# Patient Record
Sex: Female | Born: 1935
Health system: Southern US, Community
[De-identification: ages and names within clinical notes are randomized; demographics above are authoritative.]

## PROBLEM LIST (undated history)

## (undated) DIAGNOSIS — J189 Pneumonia, unspecified organism: Secondary | ICD-10-CM

## (undated) DIAGNOSIS — M4800 Spinal stenosis, site unspecified: Secondary | ICD-10-CM

## (undated) DIAGNOSIS — Z950 Presence of cardiac pacemaker: Secondary | ICD-10-CM

## (undated) DIAGNOSIS — F0390 Unspecified dementia without behavioral disturbance: Secondary | ICD-10-CM

## (undated) DIAGNOSIS — I462 Cardiac arrest due to underlying cardiac condition: Secondary | ICD-10-CM

## (undated) DIAGNOSIS — R001 Bradycardia, unspecified: Secondary | ICD-10-CM

## (undated) DIAGNOSIS — D860 Sarcoidosis of lung: Secondary | ICD-10-CM

## (undated) DIAGNOSIS — J449 Chronic obstructive pulmonary disease, unspecified: Secondary | ICD-10-CM

## (undated) DIAGNOSIS — I429 Cardiomyopathy, unspecified: Secondary | ICD-10-CM

## (undated) DIAGNOSIS — M810 Age-related osteoporosis without current pathological fracture: Secondary | ICD-10-CM

## (undated) DIAGNOSIS — E7800 Pure hypercholesterolemia, unspecified: Secondary | ICD-10-CM

## (undated) DIAGNOSIS — R55 Syncope and collapse: Secondary | ICD-10-CM

## (undated) DIAGNOSIS — Z8719 Personal history of other diseases of the digestive system: Secondary | ICD-10-CM

## (undated) HISTORY — DX: Cardiac arrest due to underlying cardiac condition: I46.2

## (undated) HISTORY — DX: Age-related osteoporosis without current pathological fracture: M81.0

## (undated) HISTORY — DX: Personal history of other diseases of the digestive system: Z87.19

## (undated) HISTORY — DX: Chronic obstructive pulmonary disease, unspecified: J44.9

## (undated) HISTORY — PX: TONSILLECTOMY: SUR1361

## (undated) HISTORY — DX: Bradycardia, unspecified: R00.1

## (undated) HISTORY — DX: Sarcoidosis of lung: D86.0

## (undated) HISTORY — DX: Cardiomyopathy, unspecified: I42.9

## (undated) HISTORY — DX: Syncope and collapse: R55

## (undated) HISTORY — DX: Spinal stenosis, site unspecified: M48.00

## (undated) HISTORY — PX: ADENOIDECTOMY: SUR15

---

## 2013-02-17 HISTORY — PX: TEE WITHOUT CARDIOVERSION: SHX5443

## 2015-11-21 DIAGNOSIS — E7800 Pure hypercholesterolemia, unspecified: Secondary | ICD-10-CM | POA: Diagnosis not present

## 2015-11-21 DIAGNOSIS — S161XXA Strain of muscle, fascia and tendon at neck level, initial encounter: Secondary | ICD-10-CM | POA: Diagnosis not present

## 2015-11-21 DIAGNOSIS — I779 Disorder of arteries and arterioles, unspecified: Secondary | ICD-10-CM | POA: Diagnosis not present

## 2015-12-08 DIAGNOSIS — J440 Chronic obstructive pulmonary disease with acute lower respiratory infection: Secondary | ICD-10-CM | POA: Diagnosis not present

## 2015-12-12 DIAGNOSIS — K439 Ventral hernia without obstruction or gangrene: Secondary | ICD-10-CM | POA: Diagnosis not present

## 2015-12-12 DIAGNOSIS — J209 Acute bronchitis, unspecified: Secondary | ICD-10-CM | POA: Diagnosis not present

## 2015-12-15 DIAGNOSIS — R1011 Right upper quadrant pain: Secondary | ICD-10-CM | POA: Diagnosis not present

## 2015-12-15 DIAGNOSIS — K5901 Slow transit constipation: Secondary | ICD-10-CM | POA: Diagnosis not present

## 2015-12-21 DIAGNOSIS — R140 Abdominal distension (gaseous): Secondary | ICD-10-CM | POA: Diagnosis not present

## 2015-12-21 DIAGNOSIS — K5909 Other constipation: Secondary | ICD-10-CM | POA: Diagnosis not present

## 2015-12-21 DIAGNOSIS — M6208 Separation of muscle (nontraumatic), other site: Secondary | ICD-10-CM | POA: Diagnosis not present

## 2015-12-26 DIAGNOSIS — I70203 Unspecified atherosclerosis of native arteries of extremities, bilateral legs: Secondary | ICD-10-CM | POA: Diagnosis not present

## 2015-12-26 DIAGNOSIS — H04123 Dry eye syndrome of bilateral lacrimal glands: Secondary | ICD-10-CM | POA: Diagnosis not present

## 2015-12-26 DIAGNOSIS — B351 Tinea unguium: Secondary | ICD-10-CM | POA: Diagnosis not present

## 2015-12-26 DIAGNOSIS — L97511 Non-pressure chronic ulcer of other part of right foot limited to breakdown of skin: Secondary | ICD-10-CM | POA: Diagnosis not present

## 2016-01-06 DIAGNOSIS — H04123 Dry eye syndrome of bilateral lacrimal glands: Secondary | ICD-10-CM | POA: Diagnosis not present

## 2016-01-23 DIAGNOSIS — R0982 Postnasal drip: Secondary | ICD-10-CM | POA: Diagnosis not present

## 2016-01-23 DIAGNOSIS — M79604 Pain in right leg: Secondary | ICD-10-CM | POA: Diagnosis not present

## 2016-02-18 DIAGNOSIS — D869 Sarcoidosis, unspecified: Secondary | ICD-10-CM | POA: Diagnosis not present

## 2016-02-18 DIAGNOSIS — R05 Cough: Secondary | ICD-10-CM | POA: Diagnosis not present

## 2016-02-18 DIAGNOSIS — R509 Fever, unspecified: Secondary | ICD-10-CM | POA: Diagnosis not present

## 2016-02-23 DIAGNOSIS — D869 Sarcoidosis, unspecified: Secondary | ICD-10-CM | POA: Diagnosis not present

## 2016-02-23 DIAGNOSIS — H6120 Impacted cerumen, unspecified ear: Secondary | ICD-10-CM | POA: Diagnosis not present

## 2016-02-23 DIAGNOSIS — J301 Allergic rhinitis due to pollen: Secondary | ICD-10-CM | POA: Diagnosis not present

## 2016-02-23 DIAGNOSIS — J441 Chronic obstructive pulmonary disease with (acute) exacerbation: Secondary | ICD-10-CM | POA: Diagnosis not present

## 2016-02-25 DIAGNOSIS — S0090XA Unspecified superficial injury of unspecified part of head, initial encounter: Secondary | ICD-10-CM | POA: Diagnosis not present

## 2016-02-25 DIAGNOSIS — S0990XA Unspecified injury of head, initial encounter: Secondary | ICD-10-CM | POA: Diagnosis not present

## 2016-02-25 DIAGNOSIS — J449 Chronic obstructive pulmonary disease, unspecified: Secondary | ICD-10-CM | POA: Diagnosis not present

## 2016-02-25 DIAGNOSIS — R51 Headache: Secondary | ICD-10-CM | POA: Diagnosis not present

## 2016-02-29 DIAGNOSIS — G319 Degenerative disease of nervous system, unspecified: Secondary | ICD-10-CM | POA: Diagnosis not present

## 2016-02-29 DIAGNOSIS — S0003XA Contusion of scalp, initial encounter: Secondary | ICD-10-CM | POA: Diagnosis not present

## 2016-03-01 DIAGNOSIS — E785 Hyperlipidemia, unspecified: Secondary | ICD-10-CM | POA: Diagnosis not present

## 2016-03-01 DIAGNOSIS — J449 Chronic obstructive pulmonary disease, unspecified: Secondary | ICD-10-CM | POA: Diagnosis not present

## 2016-03-01 DIAGNOSIS — R51 Headache: Secondary | ICD-10-CM | POA: Diagnosis not present

## 2016-03-01 DIAGNOSIS — S0990XA Unspecified injury of head, initial encounter: Secondary | ICD-10-CM | POA: Diagnosis not present

## 2016-03-16 DIAGNOSIS — R1310 Dysphagia, unspecified: Secondary | ICD-10-CM | POA: Diagnosis not present

## 2016-03-16 DIAGNOSIS — R682 Dry mouth, unspecified: Secondary | ICD-10-CM | POA: Diagnosis not present

## 2016-03-16 DIAGNOSIS — J309 Allergic rhinitis, unspecified: Secondary | ICD-10-CM | POA: Diagnosis not present

## 2016-03-16 DIAGNOSIS — J449 Chronic obstructive pulmonary disease, unspecified: Secondary | ICD-10-CM | POA: Diagnosis not present

## 2016-04-07 DIAGNOSIS — J449 Chronic obstructive pulmonary disease, unspecified: Secondary | ICD-10-CM | POA: Diagnosis not present

## 2016-04-07 DIAGNOSIS — J209 Acute bronchitis, unspecified: Secondary | ICD-10-CM | POA: Diagnosis not present

## 2016-04-07 DIAGNOSIS — J029 Acute pharyngitis, unspecified: Secondary | ICD-10-CM | POA: Diagnosis not present

## 2016-04-11 DIAGNOSIS — I340 Nonrheumatic mitral (valve) insufficiency: Secondary | ICD-10-CM | POA: Diagnosis present

## 2016-04-11 DIAGNOSIS — Z8709 Personal history of other diseases of the respiratory system: Secondary | ICD-10-CM | POA: Diagnosis not present

## 2016-04-11 DIAGNOSIS — R140 Abdominal distension (gaseous): Secondary | ICD-10-CM | POA: Diagnosis not present

## 2016-04-11 DIAGNOSIS — E785 Hyperlipidemia, unspecified: Secondary | ICD-10-CM | POA: Diagnosis not present

## 2016-04-11 DIAGNOSIS — I4510 Unspecified right bundle-branch block: Secondary | ICD-10-CM | POA: Diagnosis not present

## 2016-04-11 DIAGNOSIS — R000 Tachycardia, unspecified: Secondary | ICD-10-CM | POA: Diagnosis not present

## 2016-04-11 DIAGNOSIS — R0602 Shortness of breath: Secondary | ICD-10-CM | POA: Diagnosis not present

## 2016-04-11 DIAGNOSIS — I4891 Unspecified atrial fibrillation: Secondary | ICD-10-CM | POA: Diagnosis not present

## 2016-04-11 DIAGNOSIS — Z87891 Personal history of nicotine dependence: Secondary | ICD-10-CM | POA: Diagnosis not present

## 2016-04-11 DIAGNOSIS — K219 Gastro-esophageal reflux disease without esophagitis: Secondary | ICD-10-CM | POA: Diagnosis present

## 2016-04-11 DIAGNOSIS — R51 Headache: Secondary | ICD-10-CM | POA: Diagnosis present

## 2016-04-11 DIAGNOSIS — D869 Sarcoidosis, unspecified: Secondary | ICD-10-CM | POA: Diagnosis present

## 2016-04-11 DIAGNOSIS — F419 Anxiety disorder, unspecified: Secondary | ICD-10-CM | POA: Diagnosis not present

## 2016-04-11 DIAGNOSIS — J209 Acute bronchitis, unspecified: Secondary | ICD-10-CM | POA: Diagnosis present

## 2016-04-11 DIAGNOSIS — D860 Sarcoidosis of lung: Secondary | ICD-10-CM | POA: Diagnosis not present

## 2016-04-11 DIAGNOSIS — R0600 Dyspnea, unspecified: Secondary | ICD-10-CM | POA: Diagnosis not present

## 2016-04-11 DIAGNOSIS — E871 Hypo-osmolality and hyponatremia: Secondary | ICD-10-CM | POA: Diagnosis not present

## 2016-04-11 DIAGNOSIS — R9431 Abnormal electrocardiogram [ECG] [EKG]: Secondary | ICD-10-CM | POA: Diagnosis not present

## 2016-04-11 DIAGNOSIS — J440 Chronic obstructive pulmonary disease with acute lower respiratory infection: Secondary | ICD-10-CM | POA: Diagnosis present

## 2016-04-11 DIAGNOSIS — J189 Pneumonia, unspecified organism: Secondary | ICD-10-CM | POA: Diagnosis not present

## 2016-04-11 DIAGNOSIS — R141 Gas pain: Secondary | ICD-10-CM | POA: Diagnosis not present

## 2016-04-11 DIAGNOSIS — K760 Fatty (change of) liver, not elsewhere classified: Secondary | ICD-10-CM | POA: Diagnosis present

## 2016-04-15 DIAGNOSIS — B370 Candidal stomatitis: Secondary | ICD-10-CM | POA: Diagnosis not present

## 2016-04-15 DIAGNOSIS — K1379 Other lesions of oral mucosa: Secondary | ICD-10-CM | POA: Diagnosis not present

## 2016-04-15 DIAGNOSIS — J449 Chronic obstructive pulmonary disease, unspecified: Secondary | ICD-10-CM | POA: Diagnosis not present

## 2016-04-15 DIAGNOSIS — Z87891 Personal history of nicotine dependence: Secondary | ICD-10-CM | POA: Diagnosis not present

## 2016-04-18 DIAGNOSIS — J438 Other emphysema: Secondary | ICD-10-CM | POA: Diagnosis not present

## 2016-04-23 DIAGNOSIS — H04123 Dry eye syndrome of bilateral lacrimal glands: Secondary | ICD-10-CM | POA: Diagnosis not present

## 2016-04-23 DIAGNOSIS — H353133 Nonexudative age-related macular degeneration, bilateral, advanced atrophic without subfoveal involvement: Secondary | ICD-10-CM | POA: Diagnosis not present

## 2016-05-02 DIAGNOSIS — E872 Acidosis: Secondary | ICD-10-CM | POA: Diagnosis not present

## 2016-05-02 DIAGNOSIS — D869 Sarcoidosis, unspecified: Secondary | ICD-10-CM | POA: Diagnosis not present

## 2016-05-02 DIAGNOSIS — R739 Hyperglycemia, unspecified: Secondary | ICD-10-CM | POA: Diagnosis not present

## 2016-05-02 DIAGNOSIS — J438 Other emphysema: Secondary | ICD-10-CM | POA: Diagnosis not present

## 2016-05-02 DIAGNOSIS — D649 Anemia, unspecified: Secondary | ICD-10-CM | POA: Diagnosis not present

## 2016-05-02 DIAGNOSIS — K5901 Slow transit constipation: Secondary | ICD-10-CM | POA: Diagnosis not present

## 2016-05-02 DIAGNOSIS — E871 Hypo-osmolality and hyponatremia: Secondary | ICD-10-CM | POA: Diagnosis not present

## 2016-05-14 DIAGNOSIS — R918 Other nonspecific abnormal finding of lung field: Secondary | ICD-10-CM | POA: Diagnosis not present

## 2016-05-14 DIAGNOSIS — R0602 Shortness of breath: Secondary | ICD-10-CM | POA: Diagnosis not present

## 2016-05-17 DIAGNOSIS — M5431 Sciatica, right side: Secondary | ICD-10-CM | POA: Diagnosis not present

## 2016-05-18 DIAGNOSIS — R030 Elevated blood-pressure reading, without diagnosis of hypertension: Secondary | ICD-10-CM | POA: Diagnosis not present

## 2016-05-18 DIAGNOSIS — M545 Low back pain: Secondary | ICD-10-CM | POA: Diagnosis not present

## 2016-05-18 DIAGNOSIS — J449 Chronic obstructive pulmonary disease, unspecified: Secondary | ICD-10-CM | POA: Diagnosis not present

## 2016-05-23 DIAGNOSIS — I712 Thoracic aortic aneurysm, without rupture: Secondary | ICD-10-CM | POA: Diagnosis not present

## 2016-05-23 DIAGNOSIS — M4854XD Collapsed vertebra, not elsewhere classified, thoracic region, subsequent encounter for fracture with routine healing: Secondary | ICD-10-CM | POA: Diagnosis not present

## 2016-05-23 DIAGNOSIS — R918 Other nonspecific abnormal finding of lung field: Secondary | ICD-10-CM | POA: Diagnosis not present

## 2016-05-28 DIAGNOSIS — L84 Corns and callosities: Secondary | ICD-10-CM | POA: Diagnosis not present

## 2016-05-28 DIAGNOSIS — I70203 Unspecified atherosclerosis of native arteries of extremities, bilateral legs: Secondary | ICD-10-CM | POA: Diagnosis not present

## 2016-05-28 DIAGNOSIS — B351 Tinea unguium: Secondary | ICD-10-CM | POA: Diagnosis not present

## 2016-05-30 DIAGNOSIS — M545 Low back pain: Secondary | ICD-10-CM | POA: Diagnosis not present

## 2016-05-30 DIAGNOSIS — S22000S Wedge compression fracture of unspecified thoracic vertebra, sequela: Secondary | ICD-10-CM | POA: Diagnosis not present

## 2016-06-05 DIAGNOSIS — M545 Low back pain: Secondary | ICD-10-CM | POA: Diagnosis not present

## 2016-06-07 DIAGNOSIS — M545 Low back pain: Secondary | ICD-10-CM | POA: Diagnosis not present

## 2016-06-11 DIAGNOSIS — H1032 Unspecified acute conjunctivitis, left eye: Secondary | ICD-10-CM | POA: Diagnosis not present

## 2016-06-12 DIAGNOSIS — M545 Low back pain: Secondary | ICD-10-CM | POA: Diagnosis not present

## 2016-06-13 DIAGNOSIS — L089 Local infection of the skin and subcutaneous tissue, unspecified: Secondary | ICD-10-CM | POA: Diagnosis not present

## 2016-06-14 DIAGNOSIS — M545 Low back pain: Secondary | ICD-10-CM | POA: Diagnosis not present

## 2016-06-14 DIAGNOSIS — S99922A Unspecified injury of left foot, initial encounter: Secondary | ICD-10-CM | POA: Diagnosis not present

## 2016-06-15 DIAGNOSIS — R0602 Shortness of breath: Secondary | ICD-10-CM | POA: Diagnosis not present

## 2016-06-15 DIAGNOSIS — I444 Left anterior fascicular block: Secondary | ICD-10-CM | POA: Diagnosis not present

## 2016-06-15 DIAGNOSIS — J302 Other seasonal allergic rhinitis: Secondary | ICD-10-CM | POA: Diagnosis not present

## 2016-06-19 DIAGNOSIS — S90512D Abrasion, left ankle, subsequent encounter: Secondary | ICD-10-CM | POA: Diagnosis not present

## 2016-06-19 DIAGNOSIS — M545 Low back pain: Secondary | ICD-10-CM | POA: Diagnosis not present

## 2016-06-21 DIAGNOSIS — M545 Low back pain: Secondary | ICD-10-CM | POA: Diagnosis not present

## 2016-06-22 DIAGNOSIS — M549 Dorsalgia, unspecified: Secondary | ICD-10-CM | POA: Diagnosis not present

## 2016-06-26 DIAGNOSIS — M545 Low back pain: Secondary | ICD-10-CM | POA: Diagnosis not present

## 2016-06-28 DIAGNOSIS — S90819A Abrasion, unspecified foot, initial encounter: Secondary | ICD-10-CM | POA: Diagnosis not present

## 2016-07-03 DIAGNOSIS — M545 Low back pain: Secondary | ICD-10-CM | POA: Diagnosis not present

## 2016-07-05 DIAGNOSIS — M545 Low back pain: Secondary | ICD-10-CM | POA: Diagnosis not present

## 2016-07-10 DIAGNOSIS — M545 Low back pain: Secondary | ICD-10-CM | POA: Diagnosis not present

## 2016-07-17 DIAGNOSIS — M545 Low back pain: Secondary | ICD-10-CM | POA: Diagnosis not present

## 2016-07-19 DIAGNOSIS — M545 Low back pain: Secondary | ICD-10-CM | POA: Diagnosis not present

## 2016-07-20 DIAGNOSIS — I429 Cardiomyopathy, unspecified: Secondary | ICD-10-CM

## 2016-07-20 HISTORY — DX: Cardiomyopathy, unspecified: I42.9

## 2016-07-23 DIAGNOSIS — R0682 Tachypnea, not elsewhere classified: Secondary | ICD-10-CM | POA: Diagnosis not present

## 2016-07-23 DIAGNOSIS — I499 Cardiac arrhythmia, unspecified: Secondary | ICD-10-CM | POA: Diagnosis not present

## 2016-07-23 DIAGNOSIS — R296 Repeated falls: Secondary | ICD-10-CM | POA: Diagnosis not present

## 2016-07-23 DIAGNOSIS — I442 Atrioventricular block, complete: Secondary | ICD-10-CM | POA: Diagnosis not present

## 2016-07-23 DIAGNOSIS — R001 Bradycardia, unspecified: Secondary | ICD-10-CM | POA: Diagnosis not present

## 2016-07-23 DIAGNOSIS — R42 Dizziness and giddiness: Secondary | ICD-10-CM | POA: Diagnosis not present

## 2016-07-23 DIAGNOSIS — I469 Cardiac arrest, cause unspecified: Secondary | ICD-10-CM | POA: Diagnosis not present

## 2016-07-23 DIAGNOSIS — R110 Nausea: Secondary | ICD-10-CM | POA: Diagnosis not present

## 2016-07-23 DIAGNOSIS — R55 Syncope and collapse: Secondary | ICD-10-CM | POA: Diagnosis not present

## 2016-07-24 DIAGNOSIS — I214 Non-ST elevation (NSTEMI) myocardial infarction: Secondary | ICD-10-CM | POA: Diagnosis not present

## 2016-07-24 DIAGNOSIS — J309 Allergic rhinitis, unspecified: Secondary | ICD-10-CM | POA: Diagnosis present

## 2016-07-24 DIAGNOSIS — I5181 Takotsubo syndrome: Secondary | ICD-10-CM | POA: Diagnosis not present

## 2016-07-24 DIAGNOSIS — R931 Abnormal findings on diagnostic imaging of heart and coronary circulation: Secondary | ICD-10-CM | POA: Diagnosis not present

## 2016-07-24 DIAGNOSIS — M4800 Spinal stenosis, site unspecified: Secondary | ICD-10-CM | POA: Diagnosis present

## 2016-07-24 DIAGNOSIS — I10 Essential (primary) hypertension: Secondary | ICD-10-CM | POA: Diagnosis present

## 2016-07-24 DIAGNOSIS — J95811 Postprocedural pneumothorax: Secondary | ICD-10-CM | POA: Diagnosis not present

## 2016-07-24 DIAGNOSIS — K567 Ileus, unspecified: Secondary | ICD-10-CM | POA: Diagnosis not present

## 2016-07-24 DIAGNOSIS — J441 Chronic obstructive pulmonary disease with (acute) exacerbation: Secondary | ICD-10-CM | POA: Diagnosis not present

## 2016-07-24 DIAGNOSIS — R740 Nonspecific elevation of levels of transaminase and lactic acid dehydrogenase [LDH]: Secondary | ICD-10-CM | POA: Diagnosis not present

## 2016-07-24 DIAGNOSIS — I469 Cardiac arrest, cause unspecified: Secondary | ICD-10-CM | POA: Diagnosis not present

## 2016-07-24 DIAGNOSIS — J939 Pneumothorax, unspecified: Secondary | ICD-10-CM | POA: Diagnosis not present

## 2016-07-24 DIAGNOSIS — R001 Bradycardia, unspecified: Secondary | ICD-10-CM | POA: Diagnosis not present

## 2016-07-24 DIAGNOSIS — I420 Dilated cardiomyopathy: Secondary | ICD-10-CM | POA: Insufficient documentation

## 2016-07-24 DIAGNOSIS — I509 Heart failure, unspecified: Secondary | ICD-10-CM | POA: Diagnosis not present

## 2016-07-24 DIAGNOSIS — E7800 Pure hypercholesterolemia, unspecified: Secondary | ICD-10-CM | POA: Diagnosis present

## 2016-07-24 DIAGNOSIS — S36118A Other injury of liver, initial encounter: Secondary | ICD-10-CM | POA: Diagnosis not present

## 2016-07-24 DIAGNOSIS — I452 Bifascicular block: Secondary | ICD-10-CM | POA: Diagnosis present

## 2016-07-24 DIAGNOSIS — R092 Respiratory arrest: Secondary | ICD-10-CM | POA: Diagnosis not present

## 2016-07-24 DIAGNOSIS — Z950 Presence of cardiac pacemaker: Secondary | ICD-10-CM | POA: Diagnosis not present

## 2016-07-24 DIAGNOSIS — D869 Sarcoidosis, unspecified: Secondary | ICD-10-CM | POA: Diagnosis not present

## 2016-07-24 DIAGNOSIS — R933 Abnormal findings on diagnostic imaging of other parts of digestive tract: Secondary | ICD-10-CM | POA: Diagnosis not present

## 2016-07-24 DIAGNOSIS — K5909 Other constipation: Secondary | ICD-10-CM | POA: Diagnosis present

## 2016-07-24 DIAGNOSIS — J449 Chronic obstructive pulmonary disease, unspecified: Secondary | ICD-10-CM | POA: Diagnosis present

## 2016-07-24 DIAGNOSIS — I442 Atrioventricular block, complete: Secondary | ICD-10-CM | POA: Diagnosis not present

## 2016-07-24 DIAGNOSIS — H532 Diplopia: Secondary | ICD-10-CM | POA: Diagnosis not present

## 2016-07-24 DIAGNOSIS — J9601 Acute respiratory failure with hypoxia: Secondary | ICD-10-CM | POA: Diagnosis present

## 2016-07-24 DIAGNOSIS — B370 Candidal stomatitis: Secondary | ICD-10-CM | POA: Diagnosis not present

## 2016-07-24 DIAGNOSIS — M810 Age-related osteoporosis without current pathological fracture: Secondary | ICD-10-CM | POA: Diagnosis present

## 2016-07-24 DIAGNOSIS — I462 Cardiac arrest due to underlying cardiac condition: Secondary | ICD-10-CM

## 2016-07-24 DIAGNOSIS — R680 Hypothermia, not associated with low environmental temperature: Secondary | ICD-10-CM | POA: Diagnosis present

## 2016-07-24 DIAGNOSIS — K769 Liver disease, unspecified: Secondary | ICD-10-CM | POA: Diagnosis present

## 2016-07-24 DIAGNOSIS — I2119 ST elevation (STEMI) myocardial infarction involving other coronary artery of inferior wall: Secondary | ICD-10-CM | POA: Diagnosis not present

## 2016-07-24 DIAGNOSIS — M6281 Muscle weakness (generalized): Secondary | ICD-10-CM | POA: Diagnosis not present

## 2016-07-24 DIAGNOSIS — R1319 Other dysphagia: Secondary | ICD-10-CM | POA: Diagnosis present

## 2016-07-24 DIAGNOSIS — R0781 Pleurodynia: Secondary | ICD-10-CM | POA: Diagnosis not present

## 2016-07-24 DIAGNOSIS — K219 Gastro-esophageal reflux disease without esophagitis: Secondary | ICD-10-CM | POA: Diagnosis not present

## 2016-07-24 DIAGNOSIS — I499 Cardiac arrhythmia, unspecified: Secondary | ICD-10-CM | POA: Diagnosis not present

## 2016-07-24 DIAGNOSIS — K5900 Constipation, unspecified: Secondary | ICD-10-CM | POA: Diagnosis not present

## 2016-07-24 DIAGNOSIS — J90 Pleural effusion, not elsewhere classified: Secondary | ICD-10-CM | POA: Diagnosis not present

## 2016-07-24 DIAGNOSIS — K760 Fatty (change of) liver, not elsewhere classified: Secondary | ICD-10-CM | POA: Diagnosis present

## 2016-07-24 DIAGNOSIS — K7200 Acute and subacute hepatic failure without coma: Secondary | ICD-10-CM | POA: Diagnosis present

## 2016-07-24 DIAGNOSIS — R918 Other nonspecific abnormal finding of lung field: Secondary | ICD-10-CM | POA: Diagnosis not present

## 2016-07-24 DIAGNOSIS — R55 Syncope and collapse: Secondary | ICD-10-CM

## 2016-07-24 DIAGNOSIS — S2249XA Multiple fractures of ribs, unspecified side, initial encounter for closed fracture: Secondary | ICD-10-CM | POA: Diagnosis present

## 2016-07-24 DIAGNOSIS — I2510 Atherosclerotic heart disease of native coronary artery without angina pectoris: Secondary | ICD-10-CM | POA: Diagnosis not present

## 2016-07-24 DIAGNOSIS — I4510 Unspecified right bundle-branch block: Secondary | ICD-10-CM | POA: Diagnosis not present

## 2016-07-24 DIAGNOSIS — E785 Hyperlipidemia, unspecified: Secondary | ICD-10-CM | POA: Diagnosis not present

## 2016-07-24 DIAGNOSIS — S2242XA Multiple fractures of ribs, left side, initial encounter for closed fracture: Secondary | ICD-10-CM | POA: Diagnosis not present

## 2016-07-24 DIAGNOSIS — Z938 Other artificial opening status: Secondary | ICD-10-CM | POA: Diagnosis not present

## 2016-07-24 DIAGNOSIS — I083 Combined rheumatic disorders of mitral, aortic and tricuspid valves: Secondary | ICD-10-CM | POA: Diagnosis not present

## 2016-07-24 DIAGNOSIS — R262 Difficulty in walking, not elsewhere classified: Secondary | ICD-10-CM | POA: Diagnosis not present

## 2016-07-24 DIAGNOSIS — M62838 Other muscle spasm: Secondary | ICD-10-CM | POA: Diagnosis not present

## 2016-07-24 DIAGNOSIS — R109 Unspecified abdominal pain: Secondary | ICD-10-CM | POA: Diagnosis not present

## 2016-07-24 DIAGNOSIS — J9600 Acute respiratory failure, unspecified whether with hypoxia or hypercapnia: Secondary | ICD-10-CM | POA: Diagnosis not present

## 2016-07-24 DIAGNOSIS — I444 Left anterior fascicular block: Secondary | ICD-10-CM | POA: Diagnosis not present

## 2016-07-24 DIAGNOSIS — R570 Cardiogenic shock: Secondary | ICD-10-CM | POA: Diagnosis not present

## 2016-07-24 DIAGNOSIS — R1310 Dysphagia, unspecified: Secondary | ICD-10-CM | POA: Diagnosis not present

## 2016-07-24 DIAGNOSIS — R937 Abnormal findings on diagnostic imaging of other parts of musculoskeletal system: Secondary | ICD-10-CM | POA: Diagnosis not present

## 2016-07-24 DIAGNOSIS — G8929 Other chronic pain: Secondary | ICD-10-CM | POA: Diagnosis present

## 2016-07-24 DIAGNOSIS — Z438 Encounter for attention to other artificial openings: Secondary | ICD-10-CM | POA: Diagnosis not present

## 2016-07-24 HISTORY — DX: Syncope and collapse: R55

## 2016-07-24 HISTORY — DX: Bradycardia, unspecified: R00.1

## 2016-07-24 HISTORY — DX: Cardiac arrest due to underlying cardiac condition: I46.2

## 2016-07-24 HISTORY — PX: TRANSTHORACIC ECHOCARDIOGRAM: SHX275

## 2016-07-26 DIAGNOSIS — Z950 Presence of cardiac pacemaker: Secondary | ICD-10-CM

## 2016-07-26 HISTORY — PX: PACEMAKER INSERTION: SHX728

## 2016-07-26 HISTORY — DX: Presence of cardiac pacemaker: Z95.0

## 2016-07-26 HISTORY — PX: TRANSTHORACIC ECHOCARDIOGRAM: SHX275

## 2016-07-29 DIAGNOSIS — R109 Unspecified abdominal pain: Secondary | ICD-10-CM | POA: Diagnosis not present

## 2016-07-30 DIAGNOSIS — I5033 Acute on chronic diastolic (congestive) heart failure: Secondary | ICD-10-CM | POA: Diagnosis present

## 2016-07-30 DIAGNOSIS — I10 Essential (primary) hypertension: Secondary | ICD-10-CM | POA: Diagnosis not present

## 2016-07-30 DIAGNOSIS — I252 Old myocardial infarction: Secondary | ICD-10-CM | POA: Diagnosis not present

## 2016-07-30 DIAGNOSIS — Z950 Presence of cardiac pacemaker: Secondary | ICD-10-CM | POA: Diagnosis not present

## 2016-07-30 DIAGNOSIS — Z888 Allergy status to other drugs, medicaments and biological substances status: Secondary | ICD-10-CM | POA: Diagnosis not present

## 2016-07-30 DIAGNOSIS — R1310 Dysphagia, unspecified: Secondary | ICD-10-CM | POA: Diagnosis not present

## 2016-07-30 DIAGNOSIS — E785 Hyperlipidemia, unspecified: Secondary | ICD-10-CM | POA: Diagnosis present

## 2016-07-30 DIAGNOSIS — K567 Ileus, unspecified: Secondary | ICD-10-CM | POA: Diagnosis not present

## 2016-07-30 DIAGNOSIS — I2510 Atherosclerotic heart disease of native coronary artery without angina pectoris: Secondary | ICD-10-CM | POA: Diagnosis not present

## 2016-07-30 DIAGNOSIS — J449 Chronic obstructive pulmonary disease, unspecified: Secondary | ICD-10-CM | POA: Diagnosis present

## 2016-07-30 DIAGNOSIS — R0600 Dyspnea, unspecified: Secondary | ICD-10-CM | POA: Diagnosis not present

## 2016-07-30 DIAGNOSIS — I110 Hypertensive heart disease with heart failure: Secondary | ICD-10-CM | POA: Diagnosis not present

## 2016-07-30 DIAGNOSIS — K219 Gastro-esophageal reflux disease without esophagitis: Secondary | ICD-10-CM | POA: Diagnosis not present

## 2016-07-30 DIAGNOSIS — G4700 Insomnia, unspecified: Secondary | ICD-10-CM | POA: Diagnosis not present

## 2016-07-30 DIAGNOSIS — I469 Cardiac arrest, cause unspecified: Secondary | ICD-10-CM | POA: Diagnosis not present

## 2016-07-30 DIAGNOSIS — Z87891 Personal history of nicotine dependence: Secondary | ICD-10-CM | POA: Diagnosis not present

## 2016-07-30 DIAGNOSIS — N189 Chronic kidney disease, unspecified: Secondary | ICD-10-CM | POA: Diagnosis not present

## 2016-07-30 DIAGNOSIS — M810 Age-related osteoporosis without current pathological fracture: Secondary | ICD-10-CM | POA: Diagnosis not present

## 2016-07-30 DIAGNOSIS — I5023 Acute on chronic systolic (congestive) heart failure: Secondary | ICD-10-CM | POA: Diagnosis not present

## 2016-07-30 DIAGNOSIS — K5900 Constipation, unspecified: Secondary | ICD-10-CM | POA: Diagnosis not present

## 2016-07-30 DIAGNOSIS — R1319 Other dysphagia: Secondary | ICD-10-CM | POA: Diagnosis not present

## 2016-07-30 DIAGNOSIS — R001 Bradycardia, unspecified: Secondary | ICD-10-CM | POA: Diagnosis not present

## 2016-07-30 DIAGNOSIS — J811 Chronic pulmonary edema: Secondary | ICD-10-CM | POA: Diagnosis not present

## 2016-07-30 DIAGNOSIS — Z882 Allergy status to sulfonamides status: Secondary | ICD-10-CM | POA: Diagnosis not present

## 2016-07-30 DIAGNOSIS — M62838 Other muscle spasm: Secondary | ICD-10-CM | POA: Diagnosis not present

## 2016-07-30 DIAGNOSIS — K589 Irritable bowel syndrome without diarrhea: Secondary | ICD-10-CM | POA: Diagnosis present

## 2016-07-30 DIAGNOSIS — J90 Pleural effusion, not elsewhere classified: Secondary | ICD-10-CM | POA: Diagnosis not present

## 2016-07-30 DIAGNOSIS — J441 Chronic obstructive pulmonary disease with (acute) exacerbation: Secondary | ICD-10-CM | POA: Diagnosis not present

## 2016-07-30 DIAGNOSIS — I509 Heart failure, unspecified: Secondary | ICD-10-CM | POA: Diagnosis not present

## 2016-07-30 DIAGNOSIS — M6281 Muscle weakness (generalized): Secondary | ICD-10-CM | POA: Diagnosis not present

## 2016-07-30 DIAGNOSIS — B370 Candidal stomatitis: Secondary | ICD-10-CM | POA: Diagnosis not present

## 2016-07-30 DIAGNOSIS — R0602 Shortness of breath: Secondary | ICD-10-CM | POA: Diagnosis not present

## 2016-07-30 DIAGNOSIS — Z881 Allergy status to other antibiotic agents status: Secondary | ICD-10-CM | POA: Diagnosis not present

## 2016-07-30 DIAGNOSIS — R262 Difficulty in walking, not elsewhere classified: Secondary | ICD-10-CM | POA: Diagnosis not present

## 2016-08-01 DIAGNOSIS — G4700 Insomnia, unspecified: Secondary | ICD-10-CM | POA: Diagnosis not present

## 2016-08-01 DIAGNOSIS — K5900 Constipation, unspecified: Secondary | ICD-10-CM | POA: Diagnosis not present

## 2016-08-01 DIAGNOSIS — R1310 Dysphagia, unspecified: Secondary | ICD-10-CM | POA: Diagnosis not present

## 2016-08-03 DIAGNOSIS — M6281 Muscle weakness (generalized): Secondary | ICD-10-CM | POA: Diagnosis not present

## 2016-08-03 DIAGNOSIS — R1319 Other dysphagia: Secondary | ICD-10-CM | POA: Diagnosis not present

## 2016-08-03 DIAGNOSIS — R0600 Dyspnea, unspecified: Secondary | ICD-10-CM | POA: Diagnosis not present

## 2016-08-03 DIAGNOSIS — R1310 Dysphagia, unspecified: Secondary | ICD-10-CM | POA: Diagnosis not present

## 2016-08-03 DIAGNOSIS — K567 Ileus, unspecified: Secondary | ICD-10-CM | POA: Diagnosis not present

## 2016-08-03 DIAGNOSIS — B370 Candidal stomatitis: Secondary | ICD-10-CM | POA: Diagnosis not present

## 2016-08-03 DIAGNOSIS — R109 Unspecified abdominal pain: Secondary | ICD-10-CM | POA: Diagnosis not present

## 2016-08-03 DIAGNOSIS — J811 Chronic pulmonary edema: Secondary | ICD-10-CM | POA: Diagnosis not present

## 2016-08-03 DIAGNOSIS — Z881 Allergy status to other antibiotic agents status: Secondary | ICD-10-CM | POA: Diagnosis not present

## 2016-08-03 DIAGNOSIS — Z888 Allergy status to other drugs, medicaments and biological substances status: Secondary | ICD-10-CM | POA: Diagnosis not present

## 2016-08-03 DIAGNOSIS — R140 Abdominal distension (gaseous): Secondary | ICD-10-CM | POA: Diagnosis not present

## 2016-08-03 DIAGNOSIS — R262 Difficulty in walking, not elsewhere classified: Secondary | ICD-10-CM | POA: Diagnosis not present

## 2016-08-03 DIAGNOSIS — R0602 Shortness of breath: Secondary | ICD-10-CM | POA: Diagnosis not present

## 2016-08-03 DIAGNOSIS — I469 Cardiac arrest, cause unspecified: Secondary | ICD-10-CM | POA: Diagnosis not present

## 2016-08-03 DIAGNOSIS — M810 Age-related osteoporosis without current pathological fracture: Secondary | ICD-10-CM | POA: Diagnosis not present

## 2016-08-03 DIAGNOSIS — K589 Irritable bowel syndrome without diarrhea: Secondary | ICD-10-CM | POA: Diagnosis present

## 2016-08-03 DIAGNOSIS — I252 Old myocardial infarction: Secondary | ICD-10-CM | POA: Diagnosis not present

## 2016-08-03 DIAGNOSIS — E785 Hyperlipidemia, unspecified: Secondary | ICD-10-CM | POA: Diagnosis present

## 2016-08-03 DIAGNOSIS — I110 Hypertensive heart disease with heart failure: Secondary | ICD-10-CM | POA: Diagnosis not present

## 2016-08-03 DIAGNOSIS — R001 Bradycardia, unspecified: Secondary | ICD-10-CM | POA: Diagnosis not present

## 2016-08-03 DIAGNOSIS — M62838 Other muscle spasm: Secondary | ICD-10-CM | POA: Diagnosis not present

## 2016-08-03 DIAGNOSIS — J441 Chronic obstructive pulmonary disease with (acute) exacerbation: Secondary | ICD-10-CM | POA: Diagnosis not present

## 2016-08-03 DIAGNOSIS — I2510 Atherosclerotic heart disease of native coronary artery without angina pectoris: Secondary | ICD-10-CM | POA: Diagnosis not present

## 2016-08-03 DIAGNOSIS — J90 Pleural effusion, not elsewhere classified: Secondary | ICD-10-CM | POA: Diagnosis not present

## 2016-08-03 DIAGNOSIS — I5033 Acute on chronic diastolic (congestive) heart failure: Secondary | ICD-10-CM | POA: Diagnosis not present

## 2016-08-03 DIAGNOSIS — I5023 Acute on chronic systolic (congestive) heart failure: Secondary | ICD-10-CM | POA: Diagnosis not present

## 2016-08-03 DIAGNOSIS — I10 Essential (primary) hypertension: Secondary | ICD-10-CM | POA: Diagnosis not present

## 2016-08-03 DIAGNOSIS — N189 Chronic kidney disease, unspecified: Secondary | ICD-10-CM | POA: Diagnosis not present

## 2016-08-03 DIAGNOSIS — J449 Chronic obstructive pulmonary disease, unspecified: Secondary | ICD-10-CM | POA: Diagnosis present

## 2016-08-03 DIAGNOSIS — Z87891 Personal history of nicotine dependence: Secondary | ICD-10-CM | POA: Diagnosis not present

## 2016-08-03 DIAGNOSIS — K5900 Constipation, unspecified: Secondary | ICD-10-CM | POA: Diagnosis not present

## 2016-08-03 DIAGNOSIS — Z950 Presence of cardiac pacemaker: Secondary | ICD-10-CM | POA: Diagnosis not present

## 2016-08-03 DIAGNOSIS — Z882 Allergy status to sulfonamides status: Secondary | ICD-10-CM | POA: Diagnosis not present

## 2016-08-03 DIAGNOSIS — K219 Gastro-esophageal reflux disease without esophagitis: Secondary | ICD-10-CM | POA: Diagnosis not present

## 2016-08-03 DIAGNOSIS — I509 Heart failure, unspecified: Secondary | ICD-10-CM | POA: Diagnosis not present

## 2016-08-09 DIAGNOSIS — Z950 Presence of cardiac pacemaker: Secondary | ICD-10-CM | POA: Diagnosis not present

## 2016-08-09 DIAGNOSIS — I469 Cardiac arrest, cause unspecified: Secondary | ICD-10-CM | POA: Diagnosis not present

## 2016-08-09 DIAGNOSIS — I5022 Chronic systolic (congestive) heart failure: Secondary | ICD-10-CM | POA: Diagnosis not present

## 2016-08-09 DIAGNOSIS — M79674 Pain in right toe(s): Secondary | ICD-10-CM | POA: Diagnosis not present

## 2016-08-09 DIAGNOSIS — I110 Hypertensive heart disease with heart failure: Secondary | ICD-10-CM | POA: Diagnosis not present

## 2016-08-09 DIAGNOSIS — K5900 Constipation, unspecified: Secondary | ICD-10-CM | POA: Diagnosis not present

## 2016-08-09 DIAGNOSIS — R1310 Dysphagia, unspecified: Secondary | ICD-10-CM | POA: Diagnosis not present

## 2016-08-09 DIAGNOSIS — B351 Tinea unguium: Secondary | ICD-10-CM | POA: Diagnosis not present

## 2016-08-09 DIAGNOSIS — F0390 Unspecified dementia without behavioral disturbance: Secondary | ICD-10-CM | POA: Diagnosis not present

## 2016-08-09 DIAGNOSIS — B370 Candidal stomatitis: Secondary | ICD-10-CM | POA: Diagnosis not present

## 2016-08-09 DIAGNOSIS — M6281 Muscle weakness (generalized): Secondary | ICD-10-CM | POA: Diagnosis not present

## 2016-08-09 DIAGNOSIS — J449 Chronic obstructive pulmonary disease, unspecified: Secondary | ICD-10-CM | POA: Diagnosis not present

## 2016-08-09 DIAGNOSIS — I5033 Acute on chronic diastolic (congestive) heart failure: Secondary | ICD-10-CM | POA: Diagnosis not present

## 2016-08-09 DIAGNOSIS — M79671 Pain in right foot: Secondary | ICD-10-CM | POA: Diagnosis not present

## 2016-08-09 DIAGNOSIS — L603 Nail dystrophy: Secondary | ICD-10-CM | POA: Diagnosis not present

## 2016-08-09 DIAGNOSIS — R262 Difficulty in walking, not elsewhere classified: Secondary | ICD-10-CM | POA: Diagnosis not present

## 2016-08-09 DIAGNOSIS — J441 Chronic obstructive pulmonary disease with (acute) exacerbation: Secondary | ICD-10-CM | POA: Diagnosis not present

## 2016-08-09 DIAGNOSIS — I2510 Atherosclerotic heart disease of native coronary artery without angina pectoris: Secondary | ICD-10-CM | POA: Diagnosis not present

## 2016-08-09 DIAGNOSIS — M79672 Pain in left foot: Secondary | ICD-10-CM | POA: Diagnosis not present

## 2016-08-09 DIAGNOSIS — M79675 Pain in left toe(s): Secondary | ICD-10-CM | POA: Diagnosis not present

## 2016-08-09 DIAGNOSIS — K567 Ileus, unspecified: Secondary | ICD-10-CM | POA: Diagnosis not present

## 2016-08-09 DIAGNOSIS — M810 Age-related osteoporosis without current pathological fracture: Secondary | ICD-10-CM | POA: Diagnosis not present

## 2016-08-09 DIAGNOSIS — R1319 Other dysphagia: Secondary | ICD-10-CM | POA: Diagnosis not present

## 2016-08-09 DIAGNOSIS — N189 Chronic kidney disease, unspecified: Secondary | ICD-10-CM | POA: Diagnosis not present

## 2016-08-09 DIAGNOSIS — I70221 Atherosclerosis of native arteries of extremities with rest pain, right leg: Secondary | ICD-10-CM | POA: Diagnosis not present

## 2016-08-09 DIAGNOSIS — R001 Bradycardia, unspecified: Secondary | ICD-10-CM | POA: Diagnosis not present

## 2016-08-09 DIAGNOSIS — K219 Gastro-esophageal reflux disease without esophagitis: Secondary | ICD-10-CM | POA: Diagnosis not present

## 2016-08-09 DIAGNOSIS — I509 Heart failure, unspecified: Secondary | ICD-10-CM | POA: Diagnosis not present

## 2016-08-09 DIAGNOSIS — E785 Hyperlipidemia, unspecified: Secondary | ICD-10-CM | POA: Diagnosis not present

## 2016-08-09 DIAGNOSIS — I10 Essential (primary) hypertension: Secondary | ICD-10-CM | POA: Diagnosis not present

## 2016-08-09 DIAGNOSIS — M62838 Other muscle spasm: Secondary | ICD-10-CM | POA: Diagnosis not present

## 2016-08-09 DIAGNOSIS — L84 Corns and callosities: Secondary | ICD-10-CM | POA: Diagnosis not present

## 2016-08-13 DIAGNOSIS — K219 Gastro-esophageal reflux disease without esophagitis: Secondary | ICD-10-CM | POA: Diagnosis not present

## 2016-08-13 DIAGNOSIS — N189 Chronic kidney disease, unspecified: Secondary | ICD-10-CM | POA: Diagnosis not present

## 2016-08-13 DIAGNOSIS — R001 Bradycardia, unspecified: Secondary | ICD-10-CM | POA: Diagnosis not present

## 2016-08-13 DIAGNOSIS — I509 Heart failure, unspecified: Secondary | ICD-10-CM | POA: Diagnosis not present

## 2016-08-19 DIAGNOSIS — J189 Pneumonia, unspecified organism: Secondary | ICD-10-CM

## 2016-08-19 HISTORY — DX: Pneumonia, unspecified organism: J18.9

## 2016-08-20 DIAGNOSIS — F0390 Unspecified dementia without behavioral disturbance: Secondary | ICD-10-CM | POA: Diagnosis not present

## 2016-08-20 DIAGNOSIS — K5900 Constipation, unspecified: Secondary | ICD-10-CM | POA: Diagnosis not present

## 2016-08-20 DIAGNOSIS — I509 Heart failure, unspecified: Secondary | ICD-10-CM | POA: Diagnosis not present

## 2016-08-22 DIAGNOSIS — F0390 Unspecified dementia without behavioral disturbance: Secondary | ICD-10-CM | POA: Diagnosis not present

## 2016-08-22 DIAGNOSIS — K5900 Constipation, unspecified: Secondary | ICD-10-CM | POA: Diagnosis not present

## 2016-08-22 DIAGNOSIS — I509 Heart failure, unspecified: Secondary | ICD-10-CM | POA: Diagnosis not present

## 2016-09-03 DIAGNOSIS — I5022 Chronic systolic (congestive) heart failure: Secondary | ICD-10-CM | POA: Diagnosis not present

## 2016-09-05 DIAGNOSIS — M79674 Pain in right toe(s): Secondary | ICD-10-CM | POA: Diagnosis not present

## 2016-09-05 DIAGNOSIS — L603 Nail dystrophy: Secondary | ICD-10-CM | POA: Diagnosis not present

## 2016-09-05 DIAGNOSIS — B351 Tinea unguium: Secondary | ICD-10-CM | POA: Diagnosis not present

## 2016-09-05 DIAGNOSIS — L84 Corns and callosities: Secondary | ICD-10-CM | POA: Diagnosis not present

## 2016-09-05 DIAGNOSIS — M79675 Pain in left toe(s): Secondary | ICD-10-CM | POA: Diagnosis not present

## 2016-09-05 DIAGNOSIS — M79672 Pain in left foot: Secondary | ICD-10-CM | POA: Diagnosis not present

## 2016-09-05 DIAGNOSIS — I70221 Atherosclerosis of native arteries of extremities with rest pain, right leg: Secondary | ICD-10-CM | POA: Diagnosis not present

## 2016-09-05 DIAGNOSIS — M79671 Pain in right foot: Secondary | ICD-10-CM | POA: Diagnosis not present

## 2016-09-11 ENCOUNTER — Encounter (HOSPITAL_COMMUNITY): Payer: Self-pay | Admitting: Emergency Medicine

## 2016-09-11 ENCOUNTER — Emergency Department (HOSPITAL_COMMUNITY)
Admission: EM | Admit: 2016-09-11 | Discharge: 2016-09-12 | Disposition: A | Discharge status: Home / Self Care | Payer: Medicare Other | Attending: Emergency Medicine | Admitting: Emergency Medicine

## 2016-09-11 DIAGNOSIS — J188 Other pneumonia, unspecified organism: Secondary | ICD-10-CM | POA: Diagnosis not present

## 2016-09-11 DIAGNOSIS — Z950 Presence of cardiac pacemaker: Secondary | ICD-10-CM | POA: Diagnosis not present

## 2016-09-11 DIAGNOSIS — J181 Lobar pneumonia, unspecified organism: Secondary | ICD-10-CM

## 2016-09-11 DIAGNOSIS — J189 Pneumonia, unspecified organism: Secondary | ICD-10-CM | POA: Diagnosis not present

## 2016-09-11 DIAGNOSIS — Z87891 Personal history of nicotine dependence: Secondary | ICD-10-CM | POA: Diagnosis not present

## 2016-09-11 HISTORY — DX: Pneumonia, unspecified organism: J18.9

## 2016-09-11 HISTORY — DX: Pure hypercholesterolemia, unspecified: E78.00

## 2016-09-11 HISTORY — DX: Presence of cardiac pacemaker: Z95.0

## 2016-09-11 LAB — CBC WITH DIFFERENTIAL/PLATELET
BASOS PCT: 0 %
Basophils Absolute: 0 10*3/uL (ref 0.0–0.1)
EOS ABS: 0.2 10*3/uL (ref 0.0–0.7)
Eosinophils Relative: 3 %
HEMATOCRIT: 36.5 % (ref 36.0–46.0)
HEMOGLOBIN: 11.6 g/dL — AB (ref 12.0–15.0)
LYMPHS ABS: 1.6 10*3/uL (ref 0.7–4.0)
Lymphocytes Relative: 29 %
MCH: 28.4 pg (ref 26.0–34.0)
MCHC: 31.8 g/dL (ref 30.0–36.0)
MCV: 89.5 fL (ref 78.0–100.0)
MONOS PCT: 12 %
Monocytes Absolute: 0.7 10*3/uL (ref 0.1–1.0)
NEUTROS ABS: 3.1 10*3/uL (ref 1.7–7.7)
NEUTROS PCT: 56 %
Platelets: 207 10*3/uL (ref 150–400)
RBC: 4.08 MIL/uL (ref 3.87–5.11)
RDW: 15.6 % — ABNORMAL HIGH (ref 11.5–15.5)
WBC: 5.6 10*3/uL (ref 4.0–10.5)

## 2016-09-11 LAB — COMPREHENSIVE METABOLIC PANEL
ALBUMIN: 3.9 g/dL (ref 3.5–5.0)
ALK PHOS: 85 U/L (ref 38–126)
ALT: 36 U/L (ref 14–54)
AST: 41 U/L (ref 15–41)
Anion gap: 7 (ref 5–15)
BILIRUBIN TOTAL: 0.8 mg/dL (ref 0.3–1.2)
BUN: 16 mg/dL (ref 6–20)
CALCIUM: 10.1 mg/dL (ref 8.9–10.3)
CO2: 30 mmol/L (ref 22–32)
CREATININE: 0.83 mg/dL (ref 0.44–1.00)
Chloride: 99 mmol/L — ABNORMAL LOW (ref 101–111)
GFR calc Af Amer: >60 mL/min (ref >60)
GFR calc non Af Amer: 59 mL/min — ABNORMAL LOW (ref >60)
GLUCOSE: 102 mg/dL — AB (ref 65–99)
Potassium: 4.2 mmol/L (ref 3.5–5.1)
SODIUM: 136 mmol/L (ref 135–145)
TOTAL PROTEIN: 6.3 g/dL — AB (ref 6.5–8.1)

## 2016-09-11 MED ORDER — AZITHROMYCIN 250 MG PO TABS
500.0000 mg | ORAL_TABLET | Freq: Once | ORAL | Status: AC
  Administered 2016-09-12: 500 mg via ORAL
  Filled 2016-09-11: qty 2

## 2016-09-11 MED ORDER — AZITHROMYCIN 250 MG PO TABS: 250.0000 mg | ORAL_TABLET | Freq: Every day | ORAL | 0 refills | Status: DC

## 2016-09-11 NOTE — ED Provider Notes (Signed)
[facility] DEPT Provider Note   CSN: [id number] Arrival date & time: 09/11/16  2042  By signing my name below, I, Reola Mosher, attest that this documentation has been prepared under the direction and in the presence of Everlene Balls, MD. Electronically Signed: Reola Mosher, ED Scribe. 09/11/16. 11:44 PM.  History   Chief Complaint Chief Complaint  Patient presents with  . Pneumonia   The history is provided by the patient and a relative. No language interpreter was used.   HPI Comments: Tracey Holt is a 82 y.o. female who presents to the Emergency Department complaining of productive cough w/ yellow sputum which began approximately 1 week ago. Pt reports associated chest congestion and shortness of breath with mild exertion secondary to the onset of her cough. Pt was seen by [facility] today where she had a CXR performed which was remarkable for LLL PNA, prompting them to refer her into the ED. No noted treatments were tried prior to her arrival today in the ED. Per son, pt underwent CPR on 07/24/16, with subsequent admission and internal pacemaker placement on 07/31/16 (approximately 1 month ago). During her CPR, her son states that she did have several confirmed broken ribs at that time. He additionally notes that the pt has been feeling and acting back towards her baseline since this incident. She denies fever, chills, weakness, or any other associated symptoms.   Past Medical History:  Diagnosis Date  . Hypercholesterolemia   . Pacemaker   . Pneumonia    There are no active problems to display for this patient.  Past Surgical History:  Procedure Laterality Date  . PACEMAKER INSERTION     OB History    No data available     Home Medications    Prior to Admission medications   Medication Sig Start Date End Date Taking? Authorizing Provider  atorvastatin (LIPITOR) 10 MG tablet Take 5 mg by mouth daily.   Yes Historical Provider, MD  Polyvinyl  Alcohol-Povidone (REFRESH OP) Apply 1 capsule to eye daily as needed. For dry eyes   Yes Historical Provider, MD   Family History No family history on file.  Social History Social History  Substance Use Topics  . Smoking status: Former Research scientist (life sciences)  . Smokeless tobacco: Never Used  . Alcohol use No   Allergies   Levaquin [levofloxacin in d5w]; Penicillins; and Rifampin  Review of Systems Review of Systems A complete 10 system review of systems was obtained and all systems are negative except as noted in the HPI and PMH.   Physical Exam Updated Vital Signs BP 146/93 (BP Location: Left Arm)   Pulse 86   Temp 97.9 F (36.6 C) (Oral)   Resp 18   Ht 5' (1.524 m)   Wt 97 lb (44 kg)   SpO2 95%   BMI 18.94 kg/m   Physical Exam  Constitutional: She is oriented to person, place, and time. She appears well-developed and well-nourished. No distress.  HENT:  Head: Normocephalic and atraumatic.  Nose: Nose normal.  Mouth/Throat: Oropharynx is clear and moist. No oropharyngeal exudate.  Eyes: Conjunctivae and EOM are normal. Pupils are equal, round, and reactive to light. No scleral icterus.  Neck: Normal range of motion. Neck supple. No JVD present. No tracheal deviation present. No thyromegaly present.  Cardiovascular: Normal rate, regular rhythm and normal heart sounds.  Exam reveals no gallop and no friction rub.   No murmur heard. Pulmonary/Chest: Effort normal. No respiratory distress. She has decreased breath sounds  in the right lower field. She has no wheezes. She exhibits no tenderness.  Abdominal: Soft. Bowel sounds are normal. She exhibits no distension and no mass. There is no tenderness. There is no rebound and no guarding.  Musculoskeletal: Normal range of motion. She exhibits edema. She exhibits no tenderness.  Bilateral lower extremity edema noted.   Lymphadenopathy:    She has no cervical adenopathy.  Neurological: She is alert and oriented to person, place, and time. No  cranial nerve deficit. She exhibits normal muscle tone.  Skin: Skin is warm and dry. No rash noted. No erythema. No pallor.  Nursing note and vitals reviewed.  ED Treatments / Results  DIAGNOSTIC STUDIES: Oxygen Saturation is 95% on RA, adequate by my interpretation.   COORDINATION OF CARE: 11:44 PM-Discussed next steps with pt. Pt verbalized understanding and is agreeable with the plan.   Labs (all labs ordered are listed, but only abnormal results are displayed) Labs Reviewed  CBC WITH DIFFERENTIAL/PLATELET - Abnormal; Notable for the following:       Result Value   Hemoglobin 11.6 (*)    RDW 15.6 (*)    All other components within normal limits  COMPREHENSIVE METABOLIC PANEL - Abnormal; Notable for the following:    Chloride 99 (*)    Glucose, Bld 102 (*)    Total Protein 6.3 (*)    GFR calc non Af Amer 59 (*)    All other components within normal limits   EKG  EKG Interpretation None      Radiology No results found.  Procedures Procedures   Medications Ordered in ED Medications - No data to display  Initial Impression / Assessment and Plan / ED Course  I have reviewed the triage vital signs and the nursing notes.  Pertinent labs & imaging results that were available during my care of the patient were reviewed by me and considered in my medical decision making (see chart for details).  Clinical Course   Patient presents to the ED for pneumonia, referral from Urgent Care.  She has no abnormalities in any of her VS after waking from the waiting room to her ED room without any assistance.  She appears well and in NAD.  I believe treating this 82yr old patient outpatient would be best for her pneumonia, to avoid any complications from a hospital stay.  She was given first dose of azithromycin in the ED and will be sent home with 4 more days.  Advised for tylenol for fever and strict return precautions given.  She appears well and in NAD. VS remain within his normal  limits and he is safe for DC.  Final Clinical Impressions(s) / ED Diagnoses   Final diagnoses:  None   New Prescriptions New Prescriptions   No medications on file   I personally performed the services described in this documentation, which was scribed in my presence. The recorded information has been reviewed and is accurate.      Everlene Balls, MD 09/11/16 2354

## 2016-09-11 NOTE — ED Triage Notes (Signed)
Patient sent from [facility] urgent care this evening  , chest x-ray shows pneumonia with productive cough , chest congestion and exertional dyspnea onset last week . Denies fever or chills.

## 2016-09-12 DIAGNOSIS — J189 Pneumonia, unspecified organism: Secondary | ICD-10-CM | POA: Diagnosis not present

## 2016-09-19 ENCOUNTER — Ambulatory Visit: Payer: Medicare Other | Attending: Critical Care Medicine | Admitting: Critical Care Medicine

## 2016-09-19 ENCOUNTER — Encounter: Payer: Self-pay | Admitting: Critical Care Medicine

## 2016-09-19 ENCOUNTER — Ambulatory Visit (HOSPITAL_COMMUNITY)
Admission: RE | Admit: 2016-09-19 | Discharge: 2016-09-19 | Disposition: A | Discharge status: Home / Self Care | Payer: Medicare Other | Source: Ambulatory Visit | Attending: Critical Care Medicine | Admitting: Critical Care Medicine

## 2016-09-19 VITALS — BP 108/70 | HR 96 | Temp 97.4°F | Resp 18 | Ht 59.0 in | Wt 94.2 lb

## 2016-09-19 DIAGNOSIS — J188 Other pneumonia, unspecified organism: Secondary | ICD-10-CM | POA: Insufficient documentation

## 2016-09-19 DIAGNOSIS — Z87891 Personal history of nicotine dependence: Secondary | ICD-10-CM | POA: Insufficient documentation

## 2016-09-19 DIAGNOSIS — Z950 Presence of cardiac pacemaker: Secondary | ICD-10-CM | POA: Diagnosis not present

## 2016-09-19 DIAGNOSIS — I509 Heart failure, unspecified: Secondary | ICD-10-CM | POA: Insufficient documentation

## 2016-09-19 DIAGNOSIS — J181 Lobar pneumonia, unspecified organism: Secondary | ICD-10-CM

## 2016-09-19 DIAGNOSIS — R05 Cough: Secondary | ICD-10-CM | POA: Diagnosis not present

## 2016-09-19 DIAGNOSIS — Z8701 Personal history of pneumonia (recurrent): Secondary | ICD-10-CM | POA: Diagnosis not present

## 2016-09-19 DIAGNOSIS — E7800 Pure hypercholesterolemia, unspecified: Secondary | ICD-10-CM | POA: Insufficient documentation

## 2016-09-19 DIAGNOSIS — J189 Pneumonia, unspecified organism: Secondary | ICD-10-CM

## 2016-09-19 DIAGNOSIS — Z87898 Personal history of other specified conditions: Secondary | ICD-10-CM | POA: Diagnosis not present

## 2016-09-19 DIAGNOSIS — Z09 Encounter for follow-up examination after completed treatment for conditions other than malignant neoplasm: Secondary | ICD-10-CM | POA: Diagnosis not present

## 2016-09-19 MED ORDER — AZITHROMYCIN 250 MG PO TABS: 250.0000 mg | ORAL_TABLET | Freq: Every day | ORAL | 0 refills | Status: DC

## 2016-09-19 NOTE — Assessment & Plan Note (Signed)
Hx of syncope in 82yo.  Suspect heart block S/p PPM placement recently Need old records

## 2016-09-19 NOTE — Assessment & Plan Note (Signed)
Need details on cardiac ppm placement Note appointment on 11/15 with dr copland of Primary Care Will need Cardiology f/u as well Will let PCP establish f/u and will get old records to chart

## 2016-09-19 NOTE — Progress Notes (Signed)
Subjective:    Patient ID: Tracey Holt, female    DOB: 1922/09/16, 82 y.o.   MRN: [id number]  Pt seen in ED dx LLL PNA CAP. Rx azithromycin.   Here for f/u.    Now still has a deep cough.  No real fever.  Prior PCP in [place].  Just recently moved here.  Ex smoker  Past hx of syncope and slow HR and had PPM placed one month ago.  Had CPR.    Pneumonia  She complains of cough, difficulty breathing, shortness of breath and sputum production. There is no chest tightness, hemoptysis, hoarse voice or wheezing. This is a new problem. The current episode started 1 to 4 weeks ago. The problem occurs 2 to 4 times per day. The problem has been gradually improving. The cough is productive of purulent sputum. Associated symptoms include appetite change and dyspnea on exertion. Pertinent negatives include no ear congestion, ear pain, fever, orthopnea, PND or sore throat. Associated symptoms comments: Weight loss. Her past medical history is significant for pneumonia. Past medical history comments: Prior PNA one year ago..    Past Medical History:  Diagnosis Date  . Hypercholesterolemia   . Pacemaker   . Pneumonia      History reviewed. No pertinent family history.   Social History   Social History  . Marital status: Widowed    Spouse name: N/A  . Number of children: N/A  . Years of education: N/A   Occupational History  . Not on file.   Social History Main Topics  . Smoking status: Former Research scientist (life sciences)  . Smokeless tobacco: Never Used  . Alcohol use No  . Drug use: No  . Sexual activity: Not Currently   Other Topics Concern  . Not on file   Social History Narrative  . No narrative on file     Allergies  Allergen Reactions  . Levaquin [Levofloxacin In D5w]     Couldn't breath   . Penicillins     Arms swelling   . Rifampin     Couldn't breath      Outpatient Medications Prior to Visit  Medication Sig Dispense Refill  . atorvastatin (LIPITOR) 10 MG tablet Take 5 mg by mouth  daily.    . Polyvinyl Alcohol-Povidone (REFRESH OP) Apply 1 capsule to eye daily as needed. For dry eyes    . azithromycin (ZITHROMAX) 250 MG tablet Take 1 tablet (250 mg total) by mouth daily. 4 tablet 0   No facility-administered medications prior to visit.      Review of Systems  Constitutional: Positive for appetite change and unexpected weight change. Negative for fever.  HENT: Negative for ear pain, hoarse voice and sore throat.   Respiratory: Positive for cough, sputum production and shortness of breath. Negative for hemoptysis and wheezing.   Cardiovascular: Positive for dyspnea on exertion. Negative for PND.  Gastrointestinal:       Gerd       Objective:   Physical Exam Vitals:   09/19/16 1034  BP: 108/70  Pulse: 96  Resp: 18  Temp: 97.4 F (36.3 C)  TempSrc: Oral  SpO2: 96%  Weight: 94 lb 3.2 oz (42.7 kg)  Height: 4\' 11"  (1.499 m)    Gen: Pleasant, thin elderly femaile  in no distress,  normal affect  ENT: No lesions,  mouth clear,  oropharynx clear, no postnasal drip  Neck: No JVD, no TMG, no carotid bruits  Lungs: No use of accessory muscles, no dullness to percussion,  scattered rales LLL  Cardiovascular: RRR, heart sounds normal, no murmur or gallops, no peripheral edema  Abdomen: soft and NT, no HSM,  BS normal  Musculoskeletal: No deformities, no cyanosis or clubbing  Neuro: alert, non focal  Skin: Warm, no lesions or rashes  No results found.  CXR not available from 10/24 , was an outside urgent care, LLL PNA by report CMET and CBC from ED reviewed K 4.2  WBC 5.4      Assessment & Plan:  I personally reviewed all images and lab data in the [facility] system as well as any outside material available during this office visit and agree with the  radiology impressions.   Community acquired pneumonia of left lower lobe of lung ([place]) LLL CAP improved with azithromycin but requires extension of therapy Plan Azithromycin 250mg  daily for 3 days  further Obtain CXR today   Chronic congestive heart failure (HCC) Hx of CHF on lasix 20mg  daily and no other therapy [place]  [place] PPm placed  History of syncope Hx of syncope in 82yo.  Suspect heart block S/p PPM placement recently Need old records   S/P placement of cardiac pacemaker Need details on cardiac ppm placement Note appointment on 11/15 with dr copland of Primary Care Will need Cardiology f/u as well Will let PCP establish f/u and will get old records to chart    Tracey Holt was seen today for pneumonia.  Diagnoses and all orders for this visit:  Community acquired pneumonia of left lower lobe of lung ([place]) -     DG Chest 2 View; Future  History of syncope  S/P placement of cardiac pacemaker  Chronic congestive heart failure, unspecified congestive heart failure type ([place])  Other orders -     azithromycin (ZITHROMAX) 250 MG tablet; Take 1 tablet (250 mg total) by mouth daily.

## 2016-09-19 NOTE — Progress Notes (Signed)
Patient is here for CAP FU  Patient denies pain at this time.  Patient completed antibiotic course.  Patient has taken medication today and patient has eaten today.  Patient would like to get the flu shot at a later date

## 2016-09-19 NOTE — Patient Instructions (Signed)
Resume azithromycin 250 mg one daily for 3 days more A chest xray will be obtain, we will call you results We need records from your primary care physician in [place] Keep your established primary care appointment 10/03/16 Return here as needed

## 2016-09-19 NOTE — Assessment & Plan Note (Signed)
Hx of CHF on lasix 20mg  daily and no other therapy [place]  [place] Recent PPm placed

## 2016-09-19 NOTE — Assessment & Plan Note (Signed)
LLL CAP improved with azithromycin but requires extension of therapy Plan Azithromycin 250mg  daily for 3 days further Obtain CXR today

## 2016-09-20 NOTE — Progress Notes (Signed)
Thank you for this [place] and for contacting the patient. I will keep an eye out for a FU message if things persist.

## 2016-09-26 ENCOUNTER — Telehealth: Payer: Self-pay | Admitting: Family Medicine

## 2016-09-26 ENCOUNTER — Telehealth: Payer: Self-pay | Admitting: General Practice

## 2016-09-26 NOTE — Telephone Encounter (Signed)
Caller name: Dr Asencion Noble Relation to pt: pt's doctor Call back number: [phone] Pharmacy:  Reason for call: Dr Joya Gaskins called our office wanting to speak with Dr. Lorelei Pont about some concerns of the patient who is going to be established at our office on 10-03-16. Dr Joya Gaskins stated pt is beening transferred to our office since pt's son is now taking care of his mother. Dr Joya Gaskins would like PCP to call at the Tel mentioned above (tel [phone]) so he can go over this information with provider before the pt establishing with [place]. Please advise.

## 2016-09-26 NOTE — Telephone Encounter (Signed)
Patient called and stated that she still may be having pains in her chest. Patient had pneumonia and went to get X-rays. Patient said she was instructed by the doctor to return if she doesn't improve. Patient is very worried.  Please follow up.

## 2016-09-26 NOTE — Telephone Encounter (Signed)
Dr. Joya Gaskins will order the CT and is speaking with the patient right now.

## 2016-09-26 NOTE — Telephone Encounter (Signed)
Called and d/w Dr. Joya Gaskins.  This pt will need a CT and pulmonology consult in [place]- I will see her next week

## 2016-10-02 ENCOUNTER — Telehealth: Payer: Self-pay | Admitting: Behavioral Health

## 2016-10-02 NOTE — Telephone Encounter (Signed)
Unable to reach patient at time of Pre-Visit Call.  Left message for patient to return call when available.    

## 2016-10-03 ENCOUNTER — Ambulatory Visit (INDEPENDENT_AMBULATORY_CARE_PROVIDER_SITE_OTHER): Payer: Medicare Other | Admitting: Family Medicine

## 2016-10-03 ENCOUNTER — Encounter: Payer: Self-pay | Admitting: Family Medicine

## 2016-10-03 VITALS — BP 120/71 | HR 86 | Temp 97.6°F | Ht 59.0 in | Wt 92.6 lb

## 2016-10-03 DIAGNOSIS — R0602 Shortness of breath: Secondary | ICD-10-CM | POA: Diagnosis not present

## 2016-10-03 DIAGNOSIS — Z8679 Personal history of other diseases of the circulatory system: Secondary | ICD-10-CM

## 2016-10-03 DIAGNOSIS — R9389 Abnormal findings on diagnostic imaging of other specified body structures: Secondary | ICD-10-CM

## 2016-10-03 DIAGNOSIS — R938 Abnormal findings on diagnostic imaging of other specified body structures: Secondary | ICD-10-CM | POA: Diagnosis not present

## 2016-10-03 DIAGNOSIS — R05 Cough: Secondary | ICD-10-CM | POA: Diagnosis not present

## 2016-10-03 DIAGNOSIS — R059 Cough, unspecified: Secondary | ICD-10-CM

## 2016-10-03 DIAGNOSIS — Z5181 Encounter for therapeutic drug level monitoring: Secondary | ICD-10-CM | POA: Diagnosis not present

## 2016-10-03 DIAGNOSIS — Z23 Encounter for immunization: Secondary | ICD-10-CM

## 2016-10-03 NOTE — Progress Notes (Signed)
Pre visit review using our clinic review tool, if applicable. No additional management support is needed unless otherwise documented below in the visit note. 

## 2016-10-03 NOTE — Progress Notes (Addendum)
[facility] [address] [place], [place] [address] [id number] [phone]  Date:  10/03/2016   Name:  Tracey Holt   DOB:  July 16, 1922   MRN:  [id number]  PCP:  Lamar Blinks, MD    Chief Complaint: Establish Care (Pt here to est care. Pt states that she will need a CAT scan of her lungs per previous doctor. Should she restrict water and fluid intake. c/o all foods causing bloating.  pt has heart meds that she is currently not taking and is wondering if she should be taking them. Should she take prilosec 3 x's daily, currently taking once daily.  Pt currently has pacemaker. )   History of Present Illness:  Tracey Holt is a 82 y.o. very pleasant female patient who presents with the following:  She is here today as a new patient- she had moved her from [place] recently to be closer to family.  .   She suffered a cardiac arrest in September of this year and then had CHF.  She had a pacemaker placed  She did have what sounds like acute CHF following her cardiac arrest and was given lasix, spiro and toprol.  However she relates that she is not taking these medications and has not taken them in weeks.  She was not sure if she needed to continue these medications She notes no orthopnea, no SOB, no edema of her legs.  She does note some constipation on a regular basis but this is not new to her She uses prilosec  She notes that foods tend to sit in her stomach and she can feel bloated at times.   No vomiting.   She does have sarcoidosis of the lungs but it does not really trouble her.     Since moving to [place] she presented to the ER on 10/24 and was dx with CAP- she was treated with azithromycin and released to home.  She then followed up with Dr. Joya Gaskins in the community clinic on 11/1-  He extended her azithromycin and repeated her CXR.  Dr. Joya Gaskins also called me on the phone about this pt and discussed her care- he recommended that we get a CT  of her chest and cardiology referral and I am certainly in agreement   Patient Active Problem List   Diagnosis Date Noted  . Community acquired pneumonia of left lower lobe of lung ([place]) 09/19/2016  . S/P placement of cardiac pacemaker 09/19/2016  . History of syncope 09/19/2016  . Chronic congestive heart failure ([place]) 09/19/2016    Past Medical History:  Diagnosis Date  . Hypercholesterolemia   . Pacemaker   . Pneumonia     Past Surgical History:  Procedure Laterality Date  . PACEMAKER INSERTION      Social History  Substance Use Topics  . Smoking status: Former Research scientist (life sciences)  . Smokeless tobacco: Never Used  . Alcohol use No    No family history on file.  Allergies  Allergen Reactions  . Levaquin [Levofloxacin In D5w]     Couldn't breath   . Penicillins     Arms swelling   . Rifampin     Couldn't breath     Medication list has been reviewed and updated.  Current Outpatient Prescriptions on File Prior to Visit  Medication Sig Dispense Refill  . atorvastatin (LIPITOR) 10 MG tablet Take 5 mg by mouth daily.    Marland Kitchen omeprazole (PRILOSEC OTC) 20 MG tablet Take  20 mg by mouth daily.    . Polyvinyl Alcohol-Povidone (REFRESH OP) Apply 1 capsule to eye daily as needed. For dry eyes    . azithromycin (ZITHROMAX) 250 MG tablet Take 1 tablet (250 mg total) by mouth daily. (Patient not taking: Reported on 10/03/2016) 3 tablet 0  . furosemide (LASIX) 20 MG tablet Take 20 mg by mouth every morning.  0   No current facility-administered medications on file prior to visit.     Review of Systems:  As per HPI- otherwise negative.  She has no fever.  She has been coughing for about one month total   Physical Examination: Vitals:   10/03/16 1352  BP: 120/71  Pulse: 86  Temp: 97.6 F (36.4 C)   Vitals:   10/03/16 1352  Weight: 92 lb 9.6 oz (42 kg)  Height: 4\' 11"  (1.499 m)   Body mass index is 18.7 kg/m. Ideal Body Weight: Weight in (lb) to have BMI = 25: 123.5  GEN:  WDWN, NAD, Non-toxic, A & O x 3, thin, looks well HEENT: Atraumatic, Normocephalic. Neck supple. No masses, No LAD. Ears and Nose: No external deformity. CV: RRR, No M/G/R. No JVD. No thrill. No extra heart sounds. PULM: no wheezes, crackles. No retractions. No resp. distress. No accessory muscle use.  Mild rhonchi in both bases ABD: S, NT, ND EXTR: No c/c/e NEURO Normal gait.  PSYCH: Normally interactive. Conversant. Not depressed or anxious appearing.  Calm demeanor.  At this time I do not see signs of fluid overload or evidence of symptomatic CHF Pacemaker left chest   Assessment and Plan: Abnormal chest x-ray - Plan: CT Chest Wo Contrast  Cough - Plan: Basic metabolic panel  Immunization due  History of CHF (congestive heart failure) - Plan: B Nat Peptide, Ambulatory referral to Cardiology  Medication monitoring encounter - Plan: Basic metabolic panel  Shortness of breath - Plan: B Nat Peptide  Encounter for immunization - Plan: Flu vaccine HIGH DOSE PF  Here today to establish care.  She recently moved here from [place]; most important recent history is that she suffered cardiac arrest in September, was hospitalized and received a pacemaker.  It also seems that she had symptomatic CHF at that time, but has since stopped taking her CHF meds and does not have sx of fluid overload.  Check BNP today, monitor her sx closely and refer to cardiology  Recent CAP with abnormal chest x-ray and persistent cough, history of sarcoid.  Will order a CT of her chest Flu shot today Will plan further follow- up pending labs and CT scan  She does have some sx of GERD and wonders if she should increase her prilosec.  She is taking 20 mg now = advised that she can increase to 40 if she likes    Signed Lamar Blinks, MD  Called to go over labs 11/17. LMOM at 540 number.  Labs are ok, no evidence of active CHF, await CT   Results for orders placed or performed in visit on [id number]  Basic  metabolic panel  Result Value Ref Range   Sodium 136 135 - 145 mEq/L   Potassium 4.4 3.5 - 5.1 mEq/L   Chloride 101 96 - 112 mEq/L   CO2 28 19 - 32 mEq/L   Glucose, Bld 102 (H) 70 - 99 mg/dL   BUN 18 6 - 23 mg/dL   Creatinine, Ser 0.76 0.40 - 1.20 mg/dL   Calcium 9.5 8.4 - 10.5 mg/dL   GFR  75.57 >60.00 mL/min  B Nat Peptide  Result Value Ref Range   Pro B Natriuretic peptide (BNP) 62.0 0.0 - 100.0 pg/mL

## 2016-10-03 NOTE — Patient Instructions (Signed)
It was very nice to see you today-  I will be in touch with your labs and we will arrange for a CT scan of your chest and appointment with a cardiologist.  Take care and let's plan to meet in about on month to check on how you are doing

## 2016-10-04 LAB — BASIC METABOLIC PANEL
BUN: 18 mg/dL (ref 6–23)
CO2: 28 mEq/L (ref 19–32)
Calcium: 9.5 mg/dL (ref 8.4–10.5)
Chloride: 101 mEq/L (ref 96–112)
Creatinine, Ser: 0.76 mg/dL (ref 0.40–1.20)
GFR: 75.57 mL/min (ref >60.00)
GLUCOSE: 102 mg/dL — AB (ref 70–99)
POTASSIUM: 4.4 meq/L (ref 3.5–5.1)
SODIUM: 136 meq/L (ref 135–145)

## 2016-10-04 LAB — BRAIN NATRIURETIC PEPTIDE: Pro B Natriuretic peptide (BNP): 62 pg/mL (ref 0.0–100.0)

## 2016-10-07 ENCOUNTER — Ambulatory Visit (HOSPITAL_BASED_OUTPATIENT_CLINIC_OR_DEPARTMENT_OTHER)
Admission: RE | Admit: 2016-10-07 | Discharge: 2016-10-07 | Disposition: A | Discharge status: Home / Self Care | Payer: Medicare Other | Source: Ambulatory Visit | Attending: Family Medicine | Admitting: Family Medicine

## 2016-10-07 DIAGNOSIS — M5134 Other intervertebral disc degeneration, thoracic region: Secondary | ICD-10-CM | POA: Diagnosis not present

## 2016-10-07 DIAGNOSIS — X58XXXA Exposure to other specified factors, initial encounter: Secondary | ICD-10-CM | POA: Diagnosis not present

## 2016-10-07 DIAGNOSIS — R938 Abnormal findings on diagnostic imaging of other specified body structures: Secondary | ICD-10-CM | POA: Diagnosis not present

## 2016-10-07 DIAGNOSIS — S2243XA Multiple fractures of ribs, bilateral, initial encounter for closed fracture: Secondary | ICD-10-CM | POA: Insufficient documentation

## 2016-10-07 DIAGNOSIS — R911 Solitary pulmonary nodule: Secondary | ICD-10-CM | POA: Diagnosis not present

## 2016-10-07 DIAGNOSIS — M4854XA Collapsed vertebra, not elsewhere classified, thoracic region, initial encounter for fracture: Secondary | ICD-10-CM | POA: Insufficient documentation

## 2016-10-07 DIAGNOSIS — R9389 Abnormal findings on diagnostic imaging of other specified body structures: Secondary | ICD-10-CM

## 2016-10-07 DIAGNOSIS — I517 Cardiomegaly: Secondary | ICD-10-CM | POA: Insufficient documentation

## 2016-10-07 DIAGNOSIS — I313 Pericardial effusion (noninflammatory): Secondary | ICD-10-CM | POA: Diagnosis not present

## 2016-10-07 DIAGNOSIS — I712 Thoracic aortic aneurysm, without rupture: Secondary | ICD-10-CM | POA: Insufficient documentation

## 2016-10-08 ENCOUNTER — Telehealth: Payer: Self-pay | Admitting: Family Medicine

## 2016-10-08 DIAGNOSIS — J984 Other disorders of lung: Secondary | ICD-10-CM

## 2016-10-08 NOTE — Telephone Encounter (Signed)
Received her CT scan results- called and discussed with pt and with her son Louie Casa.  Given her age would recommend that we do the PET scan first in hopes that it might be normal before trying to do a bx. She is seeing cardiology on Monday- Dr. Ellyn Hack- will send him a message about her results re cardiomegaly and TAA  Ordered PET scan for her today to be done asap   CT CHEST WITHOUT CONTRAST  TECHNIQUE: Multidetector CT imaging of the chest was performed following the standard protocol without IV contrast.  COMPARISON: 09/19/2016 radiographs.  FINDINGS: Cardiovascular: Cardiomegaly and ascending thoracic aortic aneurysm (4.2 cm in greatest diameter) noted. A small pericardial effusion is identified. A pacemaker with leads in the right atrium and right ventricle are present.  Mediastinum/Nodes: No definite enlarged lymph nodes. No mediastinal mass noted.  Lungs/Pleura: A 2.5 x 3.2 x 3.4 cm focal consolidation/ mass is noted within the lingula. A 7 mm nodule located 1 cm superior to this focal consolidations/ mass is noted.  A 1.2 x 1.7 x 1.5 cm irregular focal consolidation/nodule is present within the inferior anterior right middle lobe.  There is no evidence of pleural effusion, pneumothorax or airspace disease. Probable COPD changes noted. Upper Abdomen: No acute abnormality.  Musculoskeletal: 50% compression fractures of T6, T7, T8, T9 and T10 are age indeterminate. Med definite bony retropulsion identified. Mild multilevel degenerative disc disease noted. Remote bilateral rib fractures are identified.  IMPRESSION: 2.5 x 3.2 x 3.4 cm lingular focal consolidation/mass with adjacent 7 mm nodule and 1.2 x 1.7 x 1.5 cm right middle lobe irregular focal consolidation/nodule -suspicious for malignancy. Recommend pulmonary/thoracic surgery consultation and/or PET-CT for further evaluation. No definite enlarged lymph nodes. 50% age indeterminate compression fractures of T6-T10,  correlate clinically with pain. Cardiomegaly, ascending thoracic aortic aneurysm (4.2 cm), and small pericardial effusion.

## 2016-10-12 ENCOUNTER — Ambulatory Visit (HOSPITAL_COMMUNITY)
Admission: RE | Admit: 2016-10-12 | Discharge: 2016-10-12 | Disposition: A | Discharge status: Home / Self Care | Payer: Medicare Other | Source: Ambulatory Visit | Attending: Family Medicine | Admitting: Family Medicine

## 2016-10-12 ENCOUNTER — Telehealth: Payer: Self-pay | Admitting: Family Medicine

## 2016-10-12 DIAGNOSIS — J984 Other disorders of lung: Secondary | ICD-10-CM | POA: Diagnosis not present

## 2016-10-12 DIAGNOSIS — R918 Other nonspecific abnormal finding of lung field: Secondary | ICD-10-CM | POA: Diagnosis not present

## 2016-10-12 DIAGNOSIS — R9389 Abnormal findings on diagnostic imaging of other specified body structures: Secondary | ICD-10-CM

## 2016-10-12 DIAGNOSIS — R911 Solitary pulmonary nodule: Secondary | ICD-10-CM | POA: Insufficient documentation

## 2016-10-12 LAB — GLUCOSE, CAPILLARY: Glucose-Capillary: 98 mg/dL (ref 65–99)

## 2016-10-12 MED ORDER — FLUDEOXYGLUCOSE F - 18 (FDG) INJECTION
4.9900 | Freq: Once | INTRAVENOUS | Status: AC | PRN
  Administered 2016-10-12: 4.99 via INTRAVENOUS

## 2016-10-12 NOTE — Telephone Encounter (Signed)
Called pt to go over PET scan results.  They are not definitive but hopeful that she does not have lung cancer.  She did use mineral oil as a treatment for constipation when she was younger so this may have caused a lipoid reaction.  I am going to refer her to pulmonology for follow-up; advised that at this time I do not think she will need a bx but she will likely need a repeat scan in 3-6 months.  She is pleased with this news and will call me if any other questions or concerns  IMPRESSION: 1. The lingular process demonstrated on the recent study is hypermetabolic. However, this demonstrates ill-defined central fat density and could be inflammatory (lipoid pneumonia). Malignancy cannot be completely excluded. 2. The right middle lobe nodule is not hypermetabolic, favoring a benign etiology. 3. No hypermetabolic adenopathy or other suspicious findings. 4. Chest CT follow-up in 3-6 months recommended

## 2016-10-15 ENCOUNTER — Ambulatory Visit (INDEPENDENT_AMBULATORY_CARE_PROVIDER_SITE_OTHER): Payer: Medicare Other | Admitting: Cardiology

## 2016-10-15 ENCOUNTER — Encounter: Payer: Self-pay | Admitting: Cardiology

## 2016-10-15 VITALS — BP 126/82 | HR 89 | Ht 59.0 in | Wt 93.0 lb

## 2016-10-15 DIAGNOSIS — I420 Dilated cardiomyopathy: Secondary | ICD-10-CM | POA: Diagnosis not present

## 2016-10-15 DIAGNOSIS — Z950 Presence of cardiac pacemaker: Secondary | ICD-10-CM | POA: Diagnosis not present

## 2016-10-15 DIAGNOSIS — L97511 Non-pressure chronic ulcer of other part of right foot limited to breakdown of skin: Secondary | ICD-10-CM

## 2016-10-15 DIAGNOSIS — R001 Bradycardia, unspecified: Secondary | ICD-10-CM | POA: Diagnosis not present

## 2016-10-15 DIAGNOSIS — I462 Cardiac arrest due to underlying cardiac condition: Secondary | ICD-10-CM

## 2016-10-15 NOTE — Patient Instructions (Signed)
DOWN LOAD OF PACEMAKER INFORMATION WILL BE DONE TODAY WILL ESTABLISH  WITH DEVICE CLINIC- AT [facility].   NO CHANGE WITH CURRENT TREATMENT.  Your physician recommends that you schedule a follow-up appointment in [place] .   If you need a refill on your cardiac medications before your next appointment, please call your pharmacy.

## 2016-10-15 NOTE — Progress Notes (Signed)
PCP: Lamar Blinks, MD  Clinic Note: Chief Complaint  Patient presents with  . Follow-up  . Chest Pain    pt states very seldom   . Shortness of Breath    a little in the right ankle     HPI: Tracey Holt is a 82 y.o. female with a PMH below who presents today for Establishment of new cardiology care in [place]. She has a history of hypertension, COPD, sarcoidosis, mild mitral regurgitation and osteoporosis as well as spinal stenosis. Prior to September when she was hospitalized for bradycardic arrest she had no significant cardiac history although she had had 2 transesophageal echocardiogram in 2014 as well as a cardiac event monitor noted below.Tracey Holt was last seen on November 15 by Dr. Edilia Bo. She really had no complaints at that time other than intermittent constipation. She is referred to cardiology for mostly pacemaker monitoring as well as abnormal findings on her CT scan and echocardiograms. Concern for  Recent Hospitalizations:   9/5-09/2016: At [facility] in [place]. She presented on September 5 after suffering a bradycardic arrest ACLS performed with 2 rounds of CPR. Transvenous pacer was placed. Unclear why she was started on amiodarone and epinephrine and transferred to [facility] for pacemaker.  She was noted to be intubated on arrival of multiple rib fractures. Echo showed EF of 40-45% with apical hypokinesis. --> She did have positive troponin levels suggestive of possible type II MI.  Permanent placement was placed on the left chest, located by left-sided pneumothorax with left-sided chest tube   Hospitalization was further, located by an ileus with severely dilated cecum. This was revealed with tap water enemas and suppositories.   ER visit on 10/24 - treated for hospital pneumonia with azithromycin this was extended by Dr. Joya Gaskins from pulmonary medicine.  Studies Reviewed: All at [facility]; updated and past  medical history/past surgical history  2D Echo 07/24/2016: EF 45-50%, mid and apical inferior wall, mid inferolateral segment, apical anterior segment and apex moderately hypokinetic. Borderline concentric LVH with GR 2 DD. Mild LA dilation and moderate RA dilation. Mild pericardial effusion. Moderate-severe TR.  Hyperkinetic basal and mid segment of the left ventricle and hypokinesis of the apical segment - suspect stress-induced cardiomyopathy versus multivessel CAD.  Limited 2D Echo 07/26/2016 (post PPM): LV EF ~30-35% with apical akinesis.  Mod LA dilation. Mild MAC. Mild thickening of MV leaflets.  Electrophysiology Report 07/26/2016: Dual-chamber pacemaker implant L chest - (with removal of temporary pacemaker) - Abbott Assurity Model [id number] - Serial [id number]; complicated by pneumothorax requiring chest tube placement Tracey Holt Parameters:  Pacing Mode: DDD  Mode Switch: On; Mode switch atrial rate 180  Lower Tracking Rate: 70  Upper Tracking Rate: 130  Cardiac event monitor March 2014: No evidence of atrial fibrillation, ventricular or supraventricular arrhythmias noted. Occasional PACs noted  TEE April 2014: EF 55-60% with normal global function. No intracardiac thrombi, mass or vegetation.Tracey Holt peripheral effusion.  moderate LA dilation. Mild MR  Interval History: [name] presents today with no cardiac complaints at all. Her biggest complaint is about a pretty well-healed ulcer on her right second toe. She has been treating with bacitracin andit is very uncomfortable. This is what she perseverated on during this clinic visit.  She has actually been doing fairly well since her discharge from the hospital is hoping to get into more activity. She is trying to do physical therapy activity. She is now moved down to be near family here  in [place]. She really denies any PND, orthopnea or edema symptoms - no further edema after acute heart failure exacerbation during her bradycardic  arrest.  No further syncope or near syncopal type symptoms. She does not have any sensation of rapid or irregular heartbeats or palpitations.  She had a recent proBNP level checked by her PCP that was within normal range. She's had CT scans to evaluate some abnormal findings found up in [place] and that suggested a possible pericardial effusion also seen on echocardiograms. Stated back to 2014.  As far as any cardiac symptoms go, she has not had any chest pain besides some rib related pain. No resting or exertional chest tightness or pressure.  No palpitations, lightheadedness, dizziness, weakness or syncope/near syncope. No TIA/amaurosis fugax symptoms. No melena, hematochezia, hematuria, or epstaxis. No claudication.  ROS: A comprehensive was performed. Review of Systems  Constitutional: Negative for chills, fever and malaise/fatigue.  HENT: Negative for nosebleeds.   Respiratory: Positive for cough (Not routine). Negative for shortness of breath.   Cardiovascular:       Per history of present illness  Gastrointestinal: Positive for abdominal pain (When constipated) and constipation. Negative for blood in stool and melena.  Musculoskeletal: Positive for back pain.  Neurological: Negative for dizziness and focal weakness.  Endo/Heme/Allergies: Positive for environmental allergies.  Psychiatric/Behavioral:       Probably has some memory loss as she is not a very good historian  All other systems reviewed and are negative.   Past Medical History:  Diagnosis Date  . Bradycardic cardiac arrest ([place]) 07/24/2016   Required CPR, intubation with multiple rib fractures. Had short term cardiac shock with acute heart failure.  Resulted in possible stress-induced cardiomyopathy  . Cardiac related syncope 07/24/2016   Bradycardic arrest requiring CPR 2 with transvenous pacemaker placed followed by permanent pacemaker; complicated by respiratory arrest, type II MI, stress-induced cardiopathy  .  Cardiomyopathy ([place]) 07/2016   Moderate severely reduced EF with apical hypokinesis suggestive of stress-induced cardiac myopathy versus multivessel CAD --> in setting of her to cardiac arrest  . COPD (chronic obstructive pulmonary disease) ([place])    By report  . History of chronic constipation   . Hypercholesterolemia   . Osteoporosis   . Pacemaker 07/26/2016   Abbott dual-chamber pacemaker: [place] [id number] - Serial [id number]  . Pneumonia 08/2016  . Sarcoidosis of lung ([place])   . Spinal stenosis     Past Surgical History:  Procedure Laterality Date  . PACEMAKER INSERTION Left 07/26/2016   Abbott Assurity Model [id number] - Serial [id number] -- complicated by pneumothorax requiring chest tube placement  . TEE WITHOUT CARDIOVERSION  02/2013   EF 55-60% with normal global function. No thrombus, mass or vegetation. Trivial-small pericardial effusion. Moderate LA dilation. Mild MR.  Domingo Dimes ECHOCARDIOGRAM  07/24/2016   [facility]: EF 45-50%. Mid and apical inferior, inferolateral and apical hypokinesis. GR 2 DD. Mild LA and moderate RA dilation. Mild paracardial effusion. Moderate to severe TR. Basal and mid segment hypokinesis with apical hypokinesis. - Suspect stress-induced cardiopathy versus multivessel CAD.  Marland Kitchen TRANSTHORACIC ECHOCARDIOGRAM  07/26/2016   [place]: LIMITED (post PPM) - although the report suggested EF 30-35% with apical akinesis, rounding note suggested this was an improvement   Current Meds  Medication Sig  . atorvastatin (LIPITOR) 10 MG tablet Take 5 mg by mouth daily.  Marland Kitchen azithromycin (ZITHROMAX) 250 MG tablet Take 1 tablet (250 mg total) by mouth daily.  Marland Kitchen omeprazole (  PRILOSEC OTC) 20 MG tablet Take 20 mg by mouth daily.  . Polyvinyl Alcohol-Povidone (REFRESH OP) Apply 1 capsule to eye daily as needed. For dry eyes   Allergies  Allergen Reactions  . Levaquin [Levofloxacin In D5w]     Couldn't breath    . Penicillins     Arms swelling   . Rifampin     Couldn't breath      Social History   Social History  . Marital status: Widowed    Spouse name: N/A  . Number of children: N/A  . Years of education: N/A   Social History Main Topics  . Smoking status: Former Research scientist (life sciences)  . Smokeless tobacco: Never Used  . Alcohol use No  . Drug use: No  . Sexual activity: Not Currently   Other Topics Concern  . None   Social History Narrative   Recently moved to [place] from [place] to live near her daughter.   History reviewed. No pertinent family history. - Non-contributory given her age & comorbidities.  Wt Readings from Last 3 Encounters:  10/15/16 42.2 kg (93 lb)  10/03/16 42 kg (92 lb 9.6 oz)  09/19/16 42.7 kg (94 lb 3.2 oz)    PHYSICAL EXAM BP 126/82   Pulse 89   Ht 4\' 11"  (1.499 m)   Wt 42.2 kg (93 lb)   BMI 18.78 kg/m  General appearance: alert, cooperative, appears stated age, She is thin and frail with significant kyphoscoliosis.  Somewhat malnourished appearing, but well-groomed. Pleasant mood and affect. Neck: no adenopathy, no carotid bruit and no JVD Lungs: Mostly clear to auscultation bilaterally - with minimal bibasilar rhonchi., normal percussion bilaterally and non-labored Heart: regular rate and rhythm,; S1& S2 normal, no murmur, click, rub or gallop; nondisplaced PMI;  pacemaker site appears to be well-healed. Abdomen: soft, non-tender; bowel sounds normal; no masses,  no organomegaly; no HJR Extremities: extremities normal, atraumatic, no cyanosis, or edema  Pulses: 2+ and symmetric;  Skin: Essentially normal texture for her age. No significant lesions or bruising.  Neurologic: Mental status: Alert, oriented, thought content appropriate; appropriately interactive and answers questions properly. Normal mood and affect. Cranial nerves: normal (II-XII grossly intact)    Adult ECG Report  Rate: 89 ;  Rhythm: A sensed-V paced. Axis is -77 due to the  paced rhythm.;   Narrative Interpretation: Normal EKG for dual-chamber pacemaker   Other studies Reviewed: Additional studies/ records that were reviewed today include:  Recent Labs:   Lab Results  Component Value Date   CREATININE 0.76 10/03/2016   No results found for: CHOL, HDL, LDLCALC, LDLDIRECT, TRIG, CHOLHDL  ProBNP 62    ASSESSMENT / PLAN: Problem List Items Addressed This Visit    Congestive dilated cardiomyopathy ([place]) (Chronic)    She had an episode of heart failure during her bradycardic arrest with what looks to possibly be stress-induced cardiomyopathy. She had been on a standing dose of Lasix which she is not currently taking - according to her medication list.  Her echocardiogram suggested an EF of somewhere between 30-45% depending on which echo you looked at. She is not had any signs or symptoms of heart failure such as dyspnea, PND orthopnea. No signs of volume overload. No longer on a diuretic.  She is not on the beta blocker that she was discharged on (was listed as Toprol 25 mg daily) -- at present, she is not really having any arrhythmia, tachycardia or any other symptoms. I think we can hold off on having  her on medications to avoid orthostatic symptoms etc.  With no signs of volume overload, I think we need a diuretic.  Having gone. Exhaustive review of her hospitalization back in September using [facility], it came attention to her EF was reduced and will therefore need to be followed up with a follow-up echo roughly 3-4 months out from her event.  We will order one prior to her seeing Dr. Sallyanne Kuster early next year.        Relevant Orders   EKG 12-Lead (Completed)   S/P placement of cardiac pacemaker - Primary (Chronic)    She has a wireless monitoring device that is labeled St. Jude - but her PPM is Abbott. [facility] notes reviewed). Will have her device interrogated & information uploaded into our tracking system. Establish f/u @ Device  Clinic ([place]).  Appears to be functioning well.  For ease of PPM monitoring & CHF management - consolidating into 1 MD, I will have her f/u with Dr. Sallyanne Kuster in ~2-3 months.        Relevant Orders   EKG 12-Lead (Completed)   Bradycardic cardiac arrest (H[facility]) (Chronic)    Status post pacemaker placed for this. A very complicated albeit short hospitalization. No further syncope or near-syncope symptoms. Aspirin she had been on a beta blocker on discharge that she is no longer taking. Unless her signs of tachycardia on her pacemaker monitoring, I would probably keep her off the beta blocker to avoid orthostasis and fatigue.      Toe ulcer, right ([place])    She has a relatively small sized crater-like ulcer on the right second toe mid digit that is not erythematous, not warm to touch. It actually seemed to be in pretty good signs of healing.  I recommended that she continue her wound care as she has been doing with bacitracin but also recommended she cover with a Band-Aid wall wearing shoes and then have printed off at nighttime. Instructed her that if she would have signs of erythema or heat or warmth in that area that she should seek out medical consult that time. Reassured her that she is doing as correct. She has good pulses palpable DP and PT.         Current medicines are reviewed at length with the patient today. (+/- concerns) none The following changes have been made: none  Patient Instructions     DOWN LOAD OF PACEMAKER INFORMATION WILL BE DONE TODAY WILL ESTABLISH  WITH DEVICE CLINIC- AT [place] [address].   NO CHANGE WITH CURRENT TREATMENT.  Your physician recommends that you schedule a follow-up appointment in [place] .   If you need a refill on your cardiac medications before your next appointment, please call your pharmacy.     Studies Ordered:   Orders Placed This Encounter  Procedures  . EKG  12-Lead      Glenetta Hew, M.D., M.S. Interventional Cardiologist   Pager # [phone] Phone # [phone] [address]. [place],  [address]

## 2016-10-17 ENCOUNTER — Ambulatory Visit: Payer: Self-pay | Admitting: Nurse Practitioner

## 2016-10-17 ENCOUNTER — Encounter: Payer: Self-pay | Admitting: Cardiology

## 2016-10-17 DIAGNOSIS — L97519 Non-pressure chronic ulcer of other part of right foot with unspecified severity: Secondary | ICD-10-CM | POA: Insufficient documentation

## 2016-10-17 NOTE — Assessment & Plan Note (Signed)
She has a wireless monitoring device that is labeled St. Jude - but her PPM is Abbott. [facility] notes reviewed). Will have her device interrogated & information uploaded into our tracking system. Establish f/u @ Device Clinic ([place]).  Appears to be functioning well.  For ease of PPM monitoring & CHF management - consolidating into 1 MD, I will have her f/u with Dr. Sallyanne Kuster in ~2-3 months.

## 2016-10-17 NOTE — Assessment & Plan Note (Signed)
She had an episode of heart failure during her bradycardic arrest with what looks to possibly be stress-induced cardiomyopathy. She had been on a standing dose of Lasix which she is not currently taking - according to her medication list.  Her echocardiogram suggested an EF of somewhere between 30-45% depending on which echo you looked at. She is not had any signs or symptoms of heart failure such as dyspnea, PND orthopnea. No signs of volume overload. No longer on a diuretic.  She is not on the beta blocker that she was discharged on (was listed as Toprol 25 mg daily) -- at present, she is not really having any arrhythmia, tachycardia or any other symptoms. I think we can hold off on having her on medications to avoid orthostatic symptoms etc.  With no signs of volume overload, I think we need a diuretic.  Having gone. Exhaustive review of her hospitalization back in September using [facility], it came attention to her EF was reduced and will therefore need to be followed up with a follow-up echo roughly 3-4 months out from her event.  We will order one prior to her seeing Dr. Sallyanne Kuster early next year.

## 2016-10-17 NOTE — Assessment & Plan Note (Signed)
She has a relatively small sized crater-like ulcer on the right second toe mid digit that is not erythematous, not warm to touch. It actually seemed to be in pretty good signs of healing.  I recommended that she continue her wound care as she has been doing with bacitracin but also recommended she cover with a Band-Aid wall wearing shoes and then have printed off at nighttime. Instructed her that if she would have signs of erythema or heat or warmth in that area that she should seek out medical consult that time. Reassured her that she is doing as correct. She has good pulses palpable DP and PT.

## 2016-10-17 NOTE — Assessment & Plan Note (Signed)
Status post pacemaker placed for this. A very complicated albeit short hospitalization. No further syncope or near-syncope symptoms. Aspirin she had been on a beta blocker on discharge that she is no longer taking. Unless her signs of tachycardia on her pacemaker monitoring, I would probably keep her off the beta blocker to avoid orthostasis and fatigue.

## 2016-10-22 ENCOUNTER — Ambulatory Visit: Payer: Medicare Other | Admitting: Interventional Cardiology

## 2016-10-29 ENCOUNTER — Ambulatory Visit (INDEPENDENT_AMBULATORY_CARE_PROVIDER_SITE_OTHER): Payer: Medicare Other | Admitting: Internal Medicine

## 2016-10-29 ENCOUNTER — Encounter: Payer: Self-pay | Admitting: Internal Medicine

## 2016-10-29 ENCOUNTER — Other Ambulatory Visit (INDEPENDENT_AMBULATORY_CARE_PROVIDER_SITE_OTHER): Payer: Medicare Other

## 2016-10-29 VITALS — BP 106/60 | HR 80 | Ht 59.0 in | Wt 93.4 lb

## 2016-10-29 DIAGNOSIS — R05 Cough: Secondary | ICD-10-CM

## 2016-10-29 DIAGNOSIS — R058 Other specified cough: Secondary | ICD-10-CM

## 2016-10-29 DIAGNOSIS — R938 Abnormal findings on diagnostic imaging of other specified body structures: Secondary | ICD-10-CM

## 2016-10-29 DIAGNOSIS — R9389 Abnormal findings on diagnostic imaging of other specified body structures: Secondary | ICD-10-CM

## 2016-10-29 LAB — CBC WITH DIFFERENTIAL/PLATELET
BASOS ABS: 0 10*3/uL (ref 0.0–0.1)
Basophils Relative: 0.8 % (ref 0.0–3.0)
EOS ABS: 0.1 10*3/uL (ref 0.0–0.7)
Eosinophils Relative: 1.2 % (ref 0.0–5.0)
HEMATOCRIT: 39 % (ref 36.0–46.0)
HEMOGLOBIN: 13.1 g/dL (ref 12.0–15.0)
LYMPHS PCT: 25 % (ref 12.0–46.0)
Lymphs Abs: 1.3 10*3/uL (ref 0.7–4.0)
MCHC: 33.7 g/dL (ref 30.0–36.0)
MCV: 85.9 fl (ref 78.0–100.0)
MONOS PCT: 14 % — AB (ref 3.0–12.0)
Monocytes Absolute: 0.7 10*3/uL (ref 0.1–1.0)
NEUTROS ABS: 3 10*3/uL (ref 1.4–7.7)
Neutrophils Relative %: 59 % (ref 43.0–77.0)
PLATELETS: 199 10*3/uL (ref 150.0–400.0)
RBC: 4.54 Mil/uL (ref 3.87–5.11)
RDW: 14.9 % (ref 11.5–15.5)
WBC: 5.1 10*3/uL (ref 4.0–10.5)

## 2016-10-29 LAB — SEDIMENTATION RATE: Sed Rate: 9 mm/hr (ref 0–30)

## 2016-10-29 NOTE — Assessment & Plan Note (Signed)
Sinus CT 10/29/2016 >>>  Allergy profile 10/29/2016 >  Eos 0. /  IgE    Upper airway cough syndrome (previously labeled PNDS) , is  so named because it's frequently impossible to sort out how much is  CR/sinusitis with freq throat clearing (which can be related to primary GERD)   vs  causing  secondary (" extra esophageal")  GERD from wide swings in gastric pressure that occur with throat clearing, often  promoting self use of mint and menthol lozenges that reduce the lower esophageal sphincter tone and exacerbate the problem further in a cyclical fashion.   These are the same pts (now being labeled as having "irritable larynx syndrome" by some cough centers) who not infrequently have a history of having failed to tolerate ace inhibitors,  dry powder inhalers or biphosphonates or report having atypical/extraesophageal reflux symptoms that don't respond to standard doses of PPI  and are easily confused as having aecopd or asthma flares by even experienced allergists/ pulmonologists (myself included).   For now will hold off any specific rx but if sinus ct neg could try zyrtec hs or 1st gen h1 if tolerates

## 2016-10-29 NOTE — Progress Notes (Signed)
Subjective:     Patient ID: Tracey Holt, female   DOB: [birth year],  MRN: [id number]  HPI   8 yowf quit smoking 1977 healthy then and bad pna while living in [place] in early 2000's ? Dx of sarcoid by pulmonary doctor in [place] referred to pulmonary clinic 10/29/2016 by Dr   Edilia Bo for abn ct chest s/p CPR in Sept 5 2017 resulting in sev rib fx on L     10/29/2016 1st [place] [facility] office visit/ Ferrel [place]   Chief Complaint  Patient presents with  . Pulmonary Consult    Referred by Dr. Silvestre Mesi for eval of abnormal chest ct. Pt c/o occ cough with yellow to clear sputum.    recent hx starts with acute cough x one week then to ER 09/11/16 cough was progressively worse over a period of several days to weeks and / felt bad/tired sob > rxzmax [facility]a [facility]askins who rec 3 more days of zmax and pt gradually improved back to baseline with CT ordered for L sided cp that has subsequently resolved (? mscp from coughing from cap) and ct suggested mass on 10/05/16 but PET suggesting lipoid pna lingula  Pt back to baseline now, minimal am mucus, never bloody or really purulent, no fever or flare with food or h/o aspiration witnessed   No obvious day to day or daytime variability or assoc   mucus plugs or hemoptysis or cp or chest tightness, subjective wheeze or overt sinus or hb symptoms. No unusual exp hx or h/o childhood pna/ asthma or knowledge of premature birth.  Sleeping ok without nocturnal  or early am exacerbation  of respiratory  c/o's or need for noct saba. Also denies any obvious fluctuation of symptoms with weather or environmental changes or other aggravating or alleviating factors except as outlined above   Current Medications, Allergies, Complete Past Medical History, Past Surgical History, Family History, and Social History were reviewed in [employer] record.  ROS  The following are not active complaints unless bolded sore  throat, dysphagia, dental problems, itching, sneezing,  nasal congestion or excess/ purulent secretions, ear ache,   fever, chills, sweats, unintended wt loss, classically pleuritic or exertional cp,  orthopnea pnd or leg swelling, presyncope, palpitations, abdominal pain, anorexia, nausea, vomiting, diarrhea  or change in bowel or bladder habits, change in stools or urine, dysuria,hematuria,  rash, arthralgias, visual complaints, headache, numbness, weakness or ataxia or problems with walking or coordination,  change in mood/affect or memory.          Review of Systems     Objective:   Physical Exam    amb pleasant wf nad  Very iffy on details of care    Wt Readings from Last 3 Encounters:  10/29/16 93 lb 6.4 oz (42.4 kg)  10/15/16 93 lb (42.2 kg)  10/03/16 92 lb 9.6 oz (42 kg)    Vital signs reviewed  - Note on arrival 02 sats  96% on RA     HEENT: nl dentition, turbinates, and oropharynx. Nl external ear canals without cough reflex   NECK :  without JVD/Nodes/TM/ nl carotid upstrokes bilaterally   LUNGS: no acc muscle use,Severely kyphotic but  clear to A and P bilaterally without cough on insp or exp maneuvers   CV:  RRR  no s3 or murmur or increase in P2, nad no edema   ABD:  soft and nontender with nl inspiratory excursion in the supine position. No  bruits or organomegaly appreciated, bowel sounds nl  MS:  Nl gait/ ext warm without deformities, calf tenderness, cyanosis or clubbing No obvious joint restrictions   SKIN: warm and dry without lesions    NEURO:  alert, approp, nl sensorium with  no motor or cerebellar deficits apparent.      I personally reviewed images and agree with radiology impression as follows:  CT PET  Scan 10/12/16 1. The lingular process demonstrated on the recent study is hypermetabolic. However, this demonstrates ill-defined central fat density and could be inflammatory (lipoid pneumonia). Malignancy cannot be completely excluded. 2. The  right middle lobe nodule is not hypermetabolic, favoring a benign etiology. 3. No hypermetabolic adenopathy or other suspicious findings. 4. Chest CT follow-up in 3-6 months recommended.   Labs ordered/ reviewed:      Chemistry      Component Value Date/Time   NA 136 10/03/2016 1433   K 4.4 10/03/2016 1433   CL 101 10/03/2016 1433   CO2 28 10/03/2016 1433   BUN 18 10/03/2016 1433   CREATININE 0.76 10/03/2016 1433      Component Value Date/Time   CALCIUM 9.5 10/03/2016 1433   ALKPHOS 85 09/11/2016 2057   AST 41 09/11/2016 2057   ALT 36 09/11/2016 2057   BILITOT 0.8 09/11/2016 2057        Lab Results  Component Value Date   WBC 5.1 10/29/2016   HGB 13.1 10/29/2016   HCT 39.0 10/29/2016   MCV 85.9 10/29/2016   PLT 199.0 10/29/2016       EOS                        0.1                                                                   10/29/2016     Lab Results  Component Value Date   PROBNP 62.0 10/03/2016       Lab Results  Component Value Date   ESRSEDRATE 9 10/29/2016        Assessment:

## 2016-10-29 NOTE — Assessment & Plan Note (Signed)
RML scarring since 2010, neg on PET  10/12/16 - new lingular density following cpr 07/24/2016 ? Lipoid pna per PET 10/12/16   Pattern is most c/w rml/ lingular syndrome and not ca or progressive MAI but at risk for both.   Discussed in detail all the  indications, usual  risks and alternatives  relative to the benefits with patient who agrees to proceed with conservative f/u as outlined  With just cxr f/u and w/u cough in [place]  Total time devoted to counseling  > 50 % of 60  office visit:  review case with pt/ discussion of options/alternatives/ personally creating written customized instructions  in presence of pt  then going over those specific  Instructions directly with the pt including how to use all of the meds but in particular covering each new medication in detail and the difference between the maintenance/automatic meds and the prns using an action plan format for the latter.  Please see AVS from this visit for a full list of these instructions

## 2016-10-29 NOTE — Patient Instructions (Addendum)
Please see patient coordinator before you leave today  to schedule sinus CT   Please remember to go to the lab  department downstairs for your tests - we will call you with the results when they are available.     Please schedule a follow up office visit in 4  weeks, call sooner if needed with cxr on return

## 2016-10-30 LAB — RESPIRATORY ALLERGY PROFILE REGION II ~~LOC~~
Allergen, C. Herbarum, M2: <0.1 kU/L
Allergen, Cottonwood, t14: <0.1 kU/L
Allergen, D pternoyssinus,d7: <0.1 kU/L
Allergen, Mulberry, t76: <0.1 kU/L
Allergen, Oak,t7: <0.1 kU/L
Bermuda Grass: <0.1 kU/L
Box Elder IgE: <0.1 kU/L
Cat Dander: <0.1 kU/L
D. farinae: <0.1 kU/L
Dog Dander: <0.1 kU/L
Elm IgE: <0.1 kU/L
IGE (IMMUNOGLOBULIN E), SERUM: 13 kU/L (ref <115)
Johnson Grass: <0.1 kU/L
Pecan/Hickory Tree IgE: <0.1 kU/L
Sheep Sorrel IgE: <0.1 kU/L

## 2016-10-30 NOTE — Progress Notes (Signed)
LMTCB

## 2016-11-01 ENCOUNTER — Telehealth: Payer: Self-pay | Admitting: Cardiovascular Disease

## 2016-11-01 NOTE — Telephone Encounter (Signed)
CALLED NO ANSWER LEFT MESSAGE CAN BACK IF NEED,.  LATE ENTRY SPOKE TO PATIENT EARLIER TODAY - PATIENT HAD CALLED OFFICE STATING SHE HAD A MESSAGE TO CALL OFFICE ( NO NAME -NO TELEPHONE MESSAGE EITHER)

## 2016-11-01 NOTE — Telephone Encounter (Signed)
Follow up    Pt verbalized that she is calling to speak to rn

## 2016-11-01 NOTE — Telephone Encounter (Signed)
New Message  Pt call requesting to speak with RN. Pt states she is returning a call. Please call back to discuss

## 2016-11-02 NOTE — Progress Notes (Signed)
LMTCB

## 2016-11-05 ENCOUNTER — Telehealth: Payer: Self-pay | Admitting: Internal Medicine

## 2016-11-05 NOTE — Telephone Encounter (Signed)
[phone] pt does not want [place] to leave a voicemail she cant check it she just  Wants [place] to keep calling

## 2016-11-05 NOTE — Telephone Encounter (Signed)
Called and spoke with pt and she is aware of results per MW.  Nothing further is needed.  

## 2016-11-05 NOTE — Progress Notes (Signed)
See phone note from 11/05/16. Will sign off.

## 2016-11-05 NOTE — Telephone Encounter (Signed)
Notes Recorded by Tanda Rockers, MD on 10/30/2016 at 4:34 PM EST Call patient : Studies are unremarkable, no change in recs    [facility] for pt.

## 2016-11-06 ENCOUNTER — Ambulatory Visit (INDEPENDENT_AMBULATORY_CARE_PROVIDER_SITE_OTHER)
Admission: RE | Admit: 2016-11-06 | Discharge: 2016-11-06 | Disposition: A | Discharge status: Home / Self Care | Payer: Medicare Other | Source: Ambulatory Visit | Attending: Internal Medicine | Admitting: Internal Medicine

## 2016-11-06 DIAGNOSIS — R05 Cough: Secondary | ICD-10-CM | POA: Diagnosis not present

## 2016-11-06 DIAGNOSIS — R058 Other specified cough: Secondary | ICD-10-CM

## 2016-11-07 ENCOUNTER — Telehealth: Payer: Self-pay | Admitting: Internal Medicine

## 2016-11-07 NOTE — Progress Notes (Signed)
Spoke with pt and notified of results per Dr. Wert. Pt verbalized understanding and denied any questions. 

## 2016-11-07 NOTE — Telephone Encounter (Signed)
Since sinus ct neg and the cough may well be due to postnasal drip syndrome I recommend   try take CHLORPHENIRAMINE  4 mg - take one every 4 hours as needed - available over the counter- may cause drowsiness so start with just a bedtime dose or two and see how you tolerate it before trying in daytime    If can't tol chlorpheniramine other choice is zyrtec at hs and if not better needs ov with all meds in hand to regroup

## 2016-11-07 NOTE — Telephone Encounter (Signed)
Spoke with pt and notified of results per Dr. Wert. Pt verbalized understanding and denied any questions. 

## 2016-11-07 NOTE — Telephone Encounter (Signed)
Notes Recorded by Tanda Rockers, MD on 11/06/2016 at 5:14 PM EST Call patient : Study is unremarkable, no change in recs     Spoke with pt. She is aware of results. States that she is still having issues with coughing. Wants to know what she can take.  MW - please advise. Thanks.

## 2016-11-08 ENCOUNTER — Telehealth: Payer: Self-pay | Admitting: Internal Medicine

## 2016-11-08 NOTE — Telephone Encounter (Signed)
Pt scheduled for 11/09/16 @ 9:30a Pt aware & voiced her understanding. Nothing further needed.

## 2016-11-08 NOTE — Telephone Encounter (Signed)
Pt calling back to get dr response to her request, please advise.Tracey Holt

## 2016-11-08 NOTE — Telephone Encounter (Signed)
lmtcb to inform pt that we have sent this message to MW and we are currently waiting on response.

## 2016-11-08 NOTE — Telephone Encounter (Signed)
MW  Please advise-Sick message  Pt. Called in today c/o still coughing with yellow mucus, increase sob, some wheezing, denies fever, chest pain. She is requesting an appointment. I do not see where you or an NP has any openings today or tomorrow as of right now. She wanted to see if there was any way she could be worked in.   Tanda Rockers, MD      11/07/16 10:28 AM  Note    Since sinus ct neg and the cough may well be due to postnasal drip syndrome I recommend   try take CHLORPHENIRAMINE  4 mg - take one every 4 hours as needed - available over the counter- may cause drowsiness so start with just a bedtime dose or two and see how you tolerate it before trying in daytime    If can't tol chlorpheniramine other choice is zyrtec at hs and if not better needs ov with

## 2016-11-08 NOTE — Telephone Encounter (Signed)
Ok to see first thing in am 12/22 - to uc or ER if can't wait   In meantime since sinus CT neg ok to try zyrtec 10 mg now and each pm

## 2016-11-09 ENCOUNTER — Other Ambulatory Visit (INDEPENDENT_AMBULATORY_CARE_PROVIDER_SITE_OTHER): Payer: Medicare Other

## 2016-11-09 ENCOUNTER — Ambulatory Visit (INDEPENDENT_AMBULATORY_CARE_PROVIDER_SITE_OTHER): Payer: Medicare Other | Admitting: Internal Medicine

## 2016-11-09 ENCOUNTER — Encounter: Payer: Self-pay | Admitting: Internal Medicine

## 2016-11-09 ENCOUNTER — Telehealth: Payer: Self-pay | Admitting: Internal Medicine

## 2016-11-09 ENCOUNTER — Ambulatory Visit (INDEPENDENT_AMBULATORY_CARE_PROVIDER_SITE_OTHER)
Admission: RE | Admit: 2016-11-09 | Discharge: 2016-11-09 | Disposition: A | Discharge status: Home / Self Care | Payer: Medicare Other | Source: Ambulatory Visit | Attending: Internal Medicine | Admitting: Internal Medicine

## 2016-11-09 VITALS — BP 110/60 | HR 93 | Ht 59.0 in | Wt 91.2 lb

## 2016-11-09 DIAGNOSIS — R05 Cough: Secondary | ICD-10-CM

## 2016-11-09 DIAGNOSIS — R0602 Shortness of breath: Secondary | ICD-10-CM | POA: Diagnosis not present

## 2016-11-09 DIAGNOSIS — R058 Other specified cough: Secondary | ICD-10-CM

## 2016-11-09 DIAGNOSIS — R0609 Other forms of dyspnea: Secondary | ICD-10-CM | POA: Diagnosis not present

## 2016-11-09 DIAGNOSIS — R938 Abnormal findings on diagnostic imaging of other specified body structures: Secondary | ICD-10-CM | POA: Diagnosis not present

## 2016-11-09 DIAGNOSIS — R0600 Dyspnea, unspecified: Secondary | ICD-10-CM | POA: Insufficient documentation

## 2016-11-09 DIAGNOSIS — R9389 Abnormal findings on diagnostic imaging of other specified body structures: Secondary | ICD-10-CM

## 2016-11-09 LAB — CBC WITH DIFFERENTIAL/PLATELET
BASOS ABS: 0 10*3/uL (ref 0.0–0.1)
Basophils Relative: 0.7 % (ref 0.0–3.0)
EOS ABS: 0.1 10*3/uL (ref 0.0–0.7)
Eosinophils Relative: 2 % (ref 0.0–5.0)
HEMATOCRIT: 40.8 % (ref 36.0–46.0)
HEMOGLOBIN: 13.8 g/dL (ref 12.0–15.0)
LYMPHS PCT: 19.7 % (ref 12.0–46.0)
Lymphs Abs: 1.1 10*3/uL (ref 0.7–4.0)
MCHC: 33.8 g/dL (ref 30.0–36.0)
MCV: 85.2 fl (ref 78.0–100.0)
MONO ABS: 0.8 10*3/uL (ref 0.1–1.0)
Monocytes Relative: 14.4 % — ABNORMAL HIGH (ref 3.0–12.0)
Neutro Abs: 3.7 10*3/uL (ref 1.4–7.7)
Neutrophils Relative %: 63.2 % (ref 43.0–77.0)
PLATELETS: 213 10*3/uL (ref 150.0–400.0)
RBC: 4.79 Mil/uL (ref 3.87–5.11)
RDW: 14.5 % (ref 11.5–15.5)
WBC: 5.8 10*3/uL (ref 4.0–10.5)

## 2016-11-09 LAB — TSH: TSH: 4.48 u[IU]/mL (ref 0.35–4.50)

## 2016-11-09 MED ORDER — PANTOPRAZOLE SODIUM 40 MG PO TBEC: 40.0000 mg | DELAYED_RELEASE_TABLET | Freq: Every day | ORAL | 2 refills | Status: DC

## 2016-11-09 MED ORDER — FAMOTIDINE 20 MG PO TABS: ORAL_TABLET | ORAL | 2 refills | Status: DC

## 2016-11-09 NOTE — Progress Notes (Signed)
Subjective:     Patient ID: Tracey Holt, female   DOB: [birth year],  MRN: [id number]     Brief patient profile:  89 yowf quit smoking 1977 healthy then and bad pna while living in [place] in early 2000's ? Dx of sarcoid by pulmonary doctor in [place] referred to pulmonary clinic 10/29/2016 by Dr   Edilia Bo for abn ct chest s/p CPR in Sept 5 2017 resulting in sev rib fx on L    History of Present Illness  10/29/2016 1st [facility] office visit/ Tracey Holt   Chief Complaint  Patient presents with  . Pulmonary Consult    Referred by Dr. Silvestre Mesi for eval of abnormal chest ct. Pt c/o occ cough with yellow to clear sputum.    recent hx starts with acute cough x one week then to ER 09/11/16 cough was progressively worse over a period of several days to weeks and / felt bad/tired sob > rx zmax [facility] Joya Gaskins who rec 3 more days of zmax and pt gradually improved back to baseline with CT ordered for L sided cp that has subsequently resolved (? mscp from coughing from cap) and ct suggested mass on 10/05/16 but PET suggesting lipoid pna lingula Pt back to baseline now, minimal am mucus, never bloody or really purulent, no fever or flare with food or h/o aspiration witnessed rec  schedule sinus CT > neg/ allergy profile neg       11/09/2016  f/u ov/Tracey Holt re: uacs? Lipoid pna/ sno longer on GERD rx but "lots of supplements  Chief Complaint  Patient presents with  . Acute Visit    Pt c/o increased SOB and cough since the last visit. Her cough is prod with yellow sputum.    sob ever since cough worse/ wakes up when time to get up not prematurely  / already tried zmax  Sob and cough Worse off ppi    No obvious day to day or daytime variability or assoc   mucus plugs or hemoptysis or cp or chest tightness, subjective wheeze or overt sinus or hb symptoms. No unusual exp hx or h/o childhood pna/ asthma or knowledge of premature birth.  Sleeping ok without  nocturnal  or early am exacerbation  of respiratory  c/o's or need for noct saba. Also denies any obvious fluctuation of symptoms with weather or environmental changes or other aggravating or alleviating factors except as outlined above   Current Medications, Allergies, Complete Past Medical History, Past Surgical History, Family History, and Social History were reviewed in [employer] record.  ROS  The following are not active complaints unless bolded sore throat, dysphagia, dental problems, itching, sneezing,  nasal congestion or excess/ purulent secretions, ear ache,   fever, chills, sweats, unintended wt loss, classically pleuritic or exertional cp,  orthopnea pnd or leg swelling, presyncope, palpitations, abdominal pain, anorexia, nausea, vomiting, diarrhea  or change in bowel or bladder habits, change in stools or urine, dysuria,hematuria,  rash, arthralgias, visual complaints, headache, numbness, weakness or ataxia or problems with walking or coordination,  change in mood/affect or memory.              Objective:   Physical Exam    amb pleasant wf nad  Very iffy on details of care     11/09/2016    91   10/29/16 93 lb 6.4 oz (42.4 kg)  10/15/16 93 lb (42.2 kg)  10/03/16 92 lb 9.6 oz (42 kg)  Vital signs reviewed  - Note on arrival 02 sats  96% on RA     HEENT: nl dentition, turbinates, and oropharynx. Nl external ear canals without cough reflex   NECK :  without JVD/Nodes/TM/ nl carotid upstrokes bilaterally   LUNGS: no acc muscle use,Severely kyphotic but  clear to A and P bilaterally without cough on insp or exp maneuvers   CV:  RRR  no s3 or murmur or increase in P2, nad no edema   ABD:  soft and nontender with nl inspiratory excursion in the supine position. No bruits or organomegaly appreciated, bowel sounds nl  MS:  Nl gait/ ext warm without deformities, calf tenderness, cyanosis or clubbing No obvious joint restrictions   SKIN: warm and  dry without lesions    NEURO:  alert, approp, nl sensorium with  no motor or cerebellar deficits apparent.      I personally reviewed images and agree with radiology impression as follows:  CT PET  Scan 10/12/16 1. The lingular process demonstrated on the recent study is hypermetabolic. However, this demonstrates ill-defined central fat density and could be inflammatory (lipoid pneumonia). Malignancy cannot be completely excluded. 2. The right middle lobe nodule is not hypermetabolic, favoring a benign etiology. 3. No hypermetabolic adenopathy or other suspicious findings. 4. Chest CT follow-up in 3-6 months recommended.   CXR 11/09/2016  1.  Stable left mid lung field mass lesion and/or infiltrate.  2. Cardiac pacer with lead tips in right atrium right ventricle. Heart size stable.     Labs ordered 11/09/2016   Lab Results  Component Value Date   TSH 4.48 11/09/2016       Labs reviewed:      Chemistry      Component Value Date/Time   NA 136 10/03/2016 1433   K 4.4 10/03/2016 1433   CL 101 10/03/2016 1433   CO2 28 10/03/2016 1433   BUN 18 10/03/2016 1433   CREATININE 0.76 10/03/2016 1433      Component Value Date/Time   CALCIUM 9.5 10/03/2016 1433   ALKPHOS 85 09/11/2016 2057   AST 41 09/11/2016 2057   ALT 36 09/11/2016 2057   BILITOT 0.8 09/11/2016 2057        Lab Results  Component Value Date   WBC 5.1 10/29/2016   HGB 13.1 10/29/2016   HCT 39.0 10/29/2016   MCV 85.9 10/29/2016   PLT 199.0 10/29/2016       EOS                        0.1                                                                   10/29/2016     Lab Results  Component Value Date   PROBNP 62.0 10/03/2016       Lab Results  Component Value Date   ESRSEDRATE 9 10/29/2016     Assessment:

## 2016-11-09 NOTE — Telephone Encounter (Signed)
LMTCB

## 2016-11-09 NOTE — Progress Notes (Signed)
ATC, NA and no option to leave msg 

## 2016-11-09 NOTE — Patient Instructions (Addendum)
Pantoprazole (protonix) 40 mg   Take  30-60 min before first meal of the day and Pepcid (famotidine)  20 mg one @  bedtime until return to office - this is the best way to tell whether stomach acid is contributing to your problem.   GERD (REFLUX)  is an extremely common cause of respiratory symptoms just like yours , many times with no obvious heartburn at all.    It can be treated with medication, but also with lifestyle changes including elevation of the head of your bed (ideally with 6 inch  bed blocks),  Smoking cessation, avoidance of late meals, excessive alcohol, and avoid fatty foods, chocolate, peppermint, colas, red wine, and acidic juices such as orange juice.  NO MINT OR MENTHOL PRODUCTS SO NO COUGH DROPS   USE SUGARLESS CANDY INSTEAD (Jolley ranchers or Stover's or Life Savers) or even ice chips will also do - the key is to swallow to prevent all throat clearing. NO OIL BASED VITAMINS - use powdered substitutes.    Please remember to go to the lab and x-ray department downstairs for your tests - we will call you with the results when they are available.  Stop all supplements for the next month     Change appt to 4 weeks from now but you need to bring all your medications with you including any over the counter

## 2016-11-13 ENCOUNTER — Ambulatory Visit (INDEPENDENT_AMBULATORY_CARE_PROVIDER_SITE_OTHER): Payer: Medicare Other | Admitting: Medical

## 2016-11-13 ENCOUNTER — Encounter: Payer: Self-pay | Admitting: Medical

## 2016-11-13 ENCOUNTER — Telehealth: Payer: Self-pay | Admitting: Internal Medicine

## 2016-11-13 ENCOUNTER — Encounter: Payer: Self-pay | Admitting: Internal Medicine

## 2016-11-13 VITALS — BP 128/85 | HR 77 | Temp 97.7°F | Ht 59.0 in | Wt 91.4 lb

## 2016-11-13 DIAGNOSIS — T7840XA Allergy, unspecified, initial encounter: Secondary | ICD-10-CM

## 2016-11-13 MED ORDER — PREDNISONE 5 MG PO TABS: ORAL_TABLET | ORAL | 0 refills | Status: DC

## 2016-11-13 NOTE — Telephone Encounter (Signed)
Patient returned phone call.Tracey Holt ° °

## 2016-11-13 NOTE — Telephone Encounter (Signed)
lmtcb X2 for pt.  

## 2016-11-13 NOTE — Telephone Encounter (Signed)
Spoke with pt, requesting lab results. Also, pt states she saw pcp today for an allergic reaction-upper lip started swelling, is now on 5mg  prednisone daily for this.  Pt is scheduled to see an allergist for this, but is requesting MW's recs for this also.    MW please advise.  Thanks.   Patient Instructions   Pantoprazole (protonix) 40 mg   Take  30-60 min before first meal of the day and Pepcid (famotidine)  20 mg one @  bedtime until return to office - this is the best way to tell whether stomach acid is contributing to your problem.    GERD (REFLUX)  is an extremely common cause of respiratory symptoms just like yours , many times with no obvious heartburn at all.     It can be treated with medication, but also with lifestyle changes including elevation of the head of your bed (ideally with 6 inch  bed blocks),  Smoking cessation, avoidance of late meals, excessive alcohol, and avoid fatty foods, chocolate, peppermint, colas, red wine, and acidic juices such as orange juice.  NO MINT OR MENTHOL PRODUCTS SO NO COUGH DROPS   USE SUGARLESS CANDY INSTEAD (Jolley ranchers or Stover's or Life Savers) or even ice chips will also do - the key is to swallow to prevent all throat clearing. NO OIL BASED VITAMINS - use powdered substitutes.     Please remember to go to the lab and x-ray department downstairs for your tests - we will call you with the results when they are available.   Stop all supplements for the next month     Change appt to 4 weeks from now but you need to bring all your medications with you including any over the counter

## 2016-11-13 NOTE — Assessment & Plan Note (Signed)
RML scarring since 2010, neg on PET  10/12/16 - new lingular density following cpr 07/24/2016 ? Lipoid pna per PET 10/12/16  - cxr 11/09/2016 no change   Most likely she has rml/lingular scarring though also concerned about lipoid pna and ca in that order but she is not a great canidate for fob much less lingulectomy so plan now to follow closely but conservatively  Discussed in detail all the  indications, usual  risks and alternatives  relative to the benefits with patient who agrees to proceed with conservative f/u as outlined

## 2016-11-13 NOTE — Telephone Encounter (Signed)
Left message for patient to call back  

## 2016-11-13 NOTE — Assessment & Plan Note (Signed)
Labs reviewed, no anemia, thyroid dz, kidney dz or chf likely

## 2016-11-13 NOTE — Progress Notes (Signed)
Pre visit review using our clinic review tool, if applicable. No additional management support is needed unless otherwise documented below in the visit note. 

## 2016-11-13 NOTE — Assessment & Plan Note (Addendum)
Sinus CT 11/06/2016 >>> No evidence of sinusitis. Allergy profile 10/29/2016 >  Eos 0.1 /  IgE  13 neg RAST  Flared off gerd rx and on multiple otc supplments some of which are oil based (see abn cxr ? Lipoid pna)  rec resume  max rx for gerd and f/u with all meds but in meantime d/c all supplements /with all meds in hand using a trust but verify approach to confirm accurate Medication  Reconciliation The principal here is that until we are certain that the  patients are doing what we've asked, it makes no sense to ask them to do more.   I had an extended discussion with the patient reviewing all relevant studies completed to date and  lasting 15 to 20 minutes of a 25 minute visit    Each maintenance medication was reviewed in detail including most importantly the difference between maintenance and prns and under what circumstances the prns are to be triggered using an action plan format that is not reflected in the computer generated alphabetically organized AVS.    Please see AVS for unique instructions that I personally wrote and verbalized to the the pt in detail and then reviewed with pt  by my nurse highlighting any  changes in therapy recommended at today's visit to their plan of care.

## 2016-11-13 NOTE — Telephone Encounter (Signed)
Result note already called in - no additional recs

## 2016-11-13 NOTE — Patient Instructions (Addendum)
For your recent lip swelling and likely allergy reaction I want you to continue the claritin  and I will rx low dose prednisone.  After discussion about potential cause I want to refer you to allergist since in  in future event could be worse type reaction.(for time being recommend stopping the [facility] as you mentioned this is new and you used last night). Although that would be rare reaction.  In event recurrent reaction prior to allergist but worse then ED evaluation.  If you call here and speak with Anderson Malta or Steffanie Dunn you can get update on referral.  Follow up in 10-14 days or as needed.

## 2016-11-13 NOTE — Telephone Encounter (Signed)
On pt allergist appointment I think Monday, wed and Friday are days she can go. But clarify with son Louie Casa first. Check if he is on her hippa form.

## 2016-11-13 NOTE — Progress Notes (Signed)
Subjective:    Patient ID: Tracey Holt, female    DOB: 02/24/1922, 82 y.o.   MRN: [id number]  HPI   Pt in states some mild upper lip swelling and she describes upper inner mid lip region swollen. She woke up today with this. No new worsening sob or wheezing. She has pacemaker and dilated cardiomyopathy.  No leg swelling reported.  Pt states her upper lip has never swelled like this before. Pt did go to party last night and ate a lot of different. No rash on her body. No itching.  No hx of anaphylaxis. Pt did use some Marolyn Hammock ranchers and last night is then 3rd night she used.  Pt does want to know why she had the swelling.  Pt saw Dr. Melvyn Novas the other day. Was evaluated for cough. Pt was given pepcid and she thought it made her dizzy. She took that on morning of 24 th. She never filled the protonix.  Pt is in not on ace inhibitor.  Pt not taking the pecpid now nor last night.     Review of Systems  Constitutional: Negative for chills, fatigue and fever.  HENT: Negative for congestion, ear discharge, ear pain, postnasal drip, rhinorrhea, sinus pressure, sneezing and sore throat.        Mild upper lip swelling.  Respiratory: Positive for cough. Negative for chest tightness, shortness of breath and wheezing.        Not presenlty but hx of mild cough evaluated by Dr. Melvyn Novas.  Gastrointestinal: Negative for abdominal pain, anal bleeding, blood in stool, constipation and diarrhea.  Musculoskeletal: Negative for back pain.  Skin: Negative for rash.  Neurological: Negative for dizziness, syncope, numbness and headaches.  Hematological: Negative for adenopathy. Does not bruise/bleed easily.  Psychiatric/Behavioral: Negative for behavioral problems and confusion.    Past Medical History:  Diagnosis Date  . Bradycardic cardiac arrest ([place]) 07/24/2016   Required CPR, intubation with multiple rib fractures. Had short term cardiac shock with acute heart failure.  Resulted in possible  stress-induced cardiomyopathy  . Cardiac related syncope 07/24/2016   Bradycardic arrest requiring CPR 2 with transvenous pacemaker placed followed by permanent pacemaker; complicated by respiratory arrest, type II MI, stress-induced cardiopathy  . Cardiomyopathy ([place]) 07/2016   Moderate severely reduced EF with apical hypokinesis suggestive of stress-induced cardiac myopathy versus multivessel CAD --> in setting of her to cardiac arrest  . COPD (chronic obstructive pulmonary disease) ([place])    By report  . History of chronic constipation   . Hypercholesterolemia   . Osteoporosis   . Pacemaker 07/26/2016   Abbott dual-chamber pacemaker: [place] [id number] - Serial [id number]  . Pneumonia 08/2016  . Sarcoidosis of lung ([place])   . Spinal stenosis      Social History   Social History  . Marital status: Widowed    Spouse name: N/A  . Number of children: N/A  . Years of education: N/A   Occupational History  . Not on file.   Social History Main Topics  . Smoking status: Former Smoker    Packs/day: 1.00    Years: 17.00    Types: Cigarettes    Quit date: 11/20/1975  . Smokeless tobacco: Never Used  . Alcohol use No  . Drug use: No  . Sexual activity: Not Currently   Other Topics Concern  . Not on file   Social History Narrative   Recently moved to [place] from [place] to live near her daughter.    Past  Surgical History:  Procedure Laterality Date  . PACEMAKER INSERTION Left 07/26/2016   Abbott Assurity Model [id number] - Serial [id number] -- complicated by pneumothorax requiring chest tube placement  . TEE WITHOUT CARDIOVERSION  02/2013   EF 55-60% with normal global function. No thrombus, mass or vegetation. Trivial-small pericardial effusion. Moderate LA dilation. Mild MR.  Domingo Dimes ECHOCARDIOGRAM  07/24/2016   [facility]: EF 45-50%. Mid and apical inferior, inferolateral and apical hypokinesis. GR 2 DD. Mild LA and moderate  RA dilation. Mild paracardial effusion. Moderate to severe TR. Basal and mid segment hypokinesis with apical hypokinesis. - Suspect stress-induced cardiopathy versus multivessel CAD.  Marland Kitchen TRANSTHORACIC ECHOCARDIOGRAM  07/26/2016   [place]: LIMITED (post PPM) - although the report suggested EF 30-35% with apical akinesis, rounding note suggested this was an improvement    No family history on file.  Allergies  Allergen Reactions  . Levaquin [Levofloxacin In D5w]     Couldn't breath   . Penicillins     Arms swelling   . Rifampin     Couldn't breath     Current Outpatient Prescriptions on File Prior to Visit  Medication Sig Dispense Refill  . atorvastatin (LIPITOR) 10 MG tablet Take 5 mg by mouth daily.    . Polyvinyl Alcohol-Povidone (REFRESH OP) Apply 1 capsule to eye daily as needed. For dry eyes    . Probiotic CAPS Take 1 capsule by mouth daily.    . famotidine (PEPCID) 20 MG tablet One at bedtime (Patient not taking: Reported on 11/13/2016) 30 tablet 2  . fluticasone (FLONASE) 50 MCG/ACT nasal spray Place 2 sprays into both nostrils daily.    . pantoprazole (PROTONIX) 40 MG tablet Take 1 tablet (40 mg total) by mouth daily. Take 30-60 min before first meal of the day (Patient not taking: Reported on 11/13/2016) 30 tablet 2   No current facility-administered medications on file prior to visit.     BP 128/85 (BP Location: Left Arm, Patient Position: Sitting, Cuff Size: Normal)   Pulse 77   Temp 97.7 F (36.5 C) (Oral)   Ht 4\' 11"  (1.499 m)   Wt 91 lb 6.4 oz (41.5 kg)   SpO2 96% Comment: RA  BMI 18.46 kg/m      Objective:   Physical Exam   General  Mental Status - Alert. General Appearance - Well groomed. Not in acute distress.  Skin Rashes- No Rashes.  HEENT Head- Normal. Ear Auditory Canal - Left- Normal. Right - Normal.Tympanic Membrane- Left- Normal. Right- Normal. Eye Sclera/Conjunctiva- Left- Normal. Right- Normal. Nose & Sinuses  Nasal Mucosa- Left-  Boggy and Congested. Right-  Boggy and  Congested.Bilateral maxillary no and  No frontal sinus pressure. Mouth & Throat Lips: Upper Lip- Normal: no dryness, cracking, pallor, cyanosis, or vesicular eruption. Lower Lip-Normal: no dryness, cracking, pallor, cyanosis or vesicular eruption. Upper lip mild swollen. Lowe lip not. Buccal mucosa looks normal.  Buccal Mucosa- Bilateral- No Aphthous ulcers. Oropharynx- No Discharge or Erythema. Tonsils: Characteristics- Bilateral- No Erythema or Congestion. Size/Enlargement- Bilateral- No enlargement. Discharge- bilateral-None.  Neck Neck- Supple. No Masses.   Chest and Lung Exam Auscultation: Breath Sounds:-Clear even and unlabored.  Cardiovascular Auscultation:Rythm- Regular, rate and rhythm. Murmurs & Other Heart Sounds:Ausculatation of the heart reveal- No Murmurs.  Lymphatic Head & Neck General Head & Neck Lymphatics: Bilateral: Description- No Localized lymphadenopathy.  Lower ext- no pedal edema present.      Assessment & Plan:  For your recent lip swelling  and likely allergy reaction I want you to continue the claritin  and I will rx low dose prednisone.  After discussion about potential cause I want to refer you to allergist since in  in future event could be worse type reaction.(for time being recommend stopping the [facility] as you mentioned this is new and you used last night). Although that would be rare reaction.  In event recurrent reaction prior to allergist but worse then ED evaluation.  If you call here and speak with Anderson Malta or Steffanie Dunn you can get update on referral.  Follow up in 10-14 days or as needed.

## 2016-11-14 NOTE — Telephone Encounter (Signed)
Spoke with the pt and notified of recs per MW  She verbalized understanding  Nothing further needed 

## 2016-11-14 NOTE — Telephone Encounter (Signed)
Fine with me

## 2016-11-14 NOTE — Telephone Encounter (Signed)
lmtcb X 2 

## 2016-11-14 NOTE — Telephone Encounter (Signed)
Duplicate message. Will close encounter.  

## 2016-11-14 NOTE — Telephone Encounter (Signed)
I gave pt lab results and she wanted to know if she can take Citrical/calcium citrate and Glucosamine?

## 2016-11-15 ENCOUNTER — Telehealth: Payer: Self-pay | Admitting: Internal Medicine

## 2016-11-15 NOTE — Telephone Encounter (Signed)
Extremely unlikely she's allergic to both but would suggest try zantac 75 mg after bfast and bedtime for now (otc) and stop the other acid suppression meds

## 2016-11-15 NOTE — Telephone Encounter (Signed)
Spoke with pt.   After taking a dose of Pepcid pt reports feeling light headed and had to lay down.  She has not taken anymore.  She hasnt started Protonix either because she started having lip swelling and had to go see her family dr and is scared to try the Protonix with all this that's going on.  Please advise if any other recommendations.

## 2016-11-15 NOTE — Telephone Encounter (Signed)
lmtcb X1 for pt  

## 2016-11-16 NOTE — Telephone Encounter (Signed)
[phone] pt unable to check vm please let it ring

## 2016-11-16 NOTE — Telephone Encounter (Signed)
LMTCB again

## 2016-11-16 NOTE — Telephone Encounter (Signed)
Pt returning call.Tracey Holt ° °

## 2016-11-16 NOTE — Telephone Encounter (Signed)
Pt called back and she is aware of MW recs.  She will try the zantac for now.

## 2016-11-16 NOTE — Telephone Encounter (Signed)
I have attempted to call the pt again and it stated that she is not able to check her VM---will try to call back.

## 2016-11-26 ENCOUNTER — Encounter: Payer: Self-pay | Admitting: Physician Assistant

## 2016-11-26 ENCOUNTER — Ambulatory Visit (INDEPENDENT_AMBULATORY_CARE_PROVIDER_SITE_OTHER): Payer: Medicare HMO | Admitting: Physician Assistant

## 2016-11-26 ENCOUNTER — Ambulatory Visit (HOSPITAL_BASED_OUTPATIENT_CLINIC_OR_DEPARTMENT_OTHER)
Admission: RE | Admit: 2016-11-26 | Discharge: 2016-11-26 | Disposition: A | Discharge status: Home / Self Care | Payer: Medicare HMO | Source: Ambulatory Visit | Attending: Physician Assistant | Admitting: Physician Assistant

## 2016-11-26 VITALS — BP 111/70 | HR 74 | Temp 97.8°F | Ht 59.0 in | Wt 92.6 lb

## 2016-11-26 DIAGNOSIS — J439 Emphysema, unspecified: Secondary | ICD-10-CM | POA: Insufficient documentation

## 2016-11-26 DIAGNOSIS — I700 Atherosclerosis of aorta: Secondary | ICD-10-CM | POA: Diagnosis not present

## 2016-11-26 DIAGNOSIS — R05 Cough: Secondary | ICD-10-CM | POA: Insufficient documentation

## 2016-11-26 DIAGNOSIS — R0602 Shortness of breath: Secondary | ICD-10-CM | POA: Diagnosis not present

## 2016-11-26 DIAGNOSIS — R059 Cough, unspecified: Secondary | ICD-10-CM

## 2016-11-26 DIAGNOSIS — M4854XA Collapsed vertebra, not elsewhere classified, thoracic region, initial encounter for fracture: Secondary | ICD-10-CM | POA: Diagnosis not present

## 2016-11-26 MED ORDER — BENZONATATE 100 MG PO CAPS: 100.0000 mg | ORAL_CAPSULE | Freq: Two times a day (BID) | ORAL | 0 refills | Status: DC | PRN

## 2016-11-26 NOTE — Patient Instructions (Signed)
It was great meeting you today!  Please go to the first floor to complete a chest xray.  I have sent in a medication to help with your cough, please take this as directed for your cough.  If you develop any worsening symptoms please call us or seek medical attention.

## 2016-11-26 NOTE — Progress Notes (Signed)
Subjective:    Patient ID: Tracey Holt, female    DOB: 10-Apr-1922, 83 y.o.   MRN: [id number]  HPI  Tracey Holt is a 83 y/o female who presents with a chief complaint of cough. She states that she has had cough for 6 weeks but just noticed the presence of dark yellow phlegm since yesterday. She saw Dr. Melvyn Novas for the same symptoms on 11/09/16 (see below in HPI) and the only change in her symptoms is that her sputum is now a slightly darker yellow, which she first noticed yesterday. She denies fever, night sweats, change in appetite, chest pain, SOB.   Of note, patient went to the ER on 09/11/16 for cough that was progressively worse over a period of several days to weeks, she was given a z-pack and then had a f/u at [facility] where she was given 3 more days of zmax. She is followed by pulmonary. She was found to have a possible mass in her lung on her CT scan, but PET scan suggested lipoid PNA, or possibly cancer. She was last seen on 11/09/16 by Dr. Melvyn Novas and was told to maximize all medicines needed for GERD. He has recommended multiple medications for GERD, protonix, zantac and pepcid however she tells me that she has not taken any of them for more than one day. She states that she read bad things about them on the internet, in terms of their side effects. Additionally, she said that she thought she felt like they caused a little dizziness. Per Dr. Gustavus Bryant most recent note, she is not a great candidate for FOB or lingulectomy so she will be followed closely yet conservatively for this, she tells me that her next appointment with him is in two weeks.  She is not on inhalers, states that she will not take any if prescribed for her. She states that she cannot take Mucinex because it constipates her.   Review of Systems  See HPI.  Past Medical History:  Diagnosis Date  . Bradycardic cardiac arrest ([place]) 07/24/2016   Required CPR, intubation with multiple rib fractures. Had short term  cardiac shock with acute heart failure.  Resulted in possible stress-induced cardiomyopathy  . Cardiac related syncope 07/24/2016   Bradycardic arrest requiring CPR 2 with transvenous pacemaker placed followed by permanent pacemaker; complicated by respiratory arrest, type II MI, stress-induced cardiopathy  . Cardiomyopathy ([place]) 07/2016   Moderate severely reduced EF with apical hypokinesis suggestive of stress-induced cardiac myopathy versus multivessel CAD --> in setting of her to cardiac arrest  . COPD (chronic obstructive pulmonary disease) ([place])    By report  . History of chronic constipation   . Hypercholesterolemia   . Osteoporosis   . Pacemaker 07/26/2016   Abbott dual-chamber pacemaker: [place] [id number] - Serial [id number]  . Pneumonia 08/2016  . Sarcoidosis of lung ([place])   . Spinal stenosis      Social History   Social History  . Marital status: Widowed    Spouse name: N/A  . Number of children: N/A  . Years of education: N/A   Occupational History  . Not on file.   Social History Main Topics  . Smoking status: Former Smoker    Packs/day: 1.00    Years: 17.00    Types: Cigarettes    Quit date: 11/20/1975  . Smokeless tobacco: Never Used  . Alcohol use No  . Drug use: No  . Sexual activity: Not Currently   Other Topics Concern  .  Not on file   Social History Narrative   Recently moved to [place] from [place] to live near her daughter.    Past Surgical History:  Procedure Laterality Date  . PACEMAKER INSERTION Left 07/26/2016   Abbott Assurity Model [id number] - Serial [id number] -- complicated by pneumothorax requiring chest tube placement  . TEE WITHOUT CARDIOVERSION  02/2013   EF 55-60% with normal global function. No thrombus, mass or vegetation. Trivial-small pericardial effusion. Moderate LA dilation. Mild MR.  Domingo Dimes ECHOCARDIOGRAM  07/24/2016   [facility]: EF 45-50%. Mid and apical inferior,  inferolateral and apical hypokinesis. GR 2 DD. Mild LA and moderate RA dilation. Mild paracardial effusion. Moderate to severe TR. Basal and mid segment hypokinesis with apical hypokinesis. - Suspect stress-induced cardiopathy versus multivessel CAD.  Marland Kitchen TRANSTHORACIC ECHOCARDIOGRAM  07/26/2016   [place]: LIMITED (post PPM) - although the report suggested EF 30-35% with apical akinesis, rounding note suggested this was an improvement    No family history on file.  Allergies  Allergen Reactions  . Levaquin [Levofloxacin In D5w]     Couldn't breath   . Penicillins     Arms swelling   . Rifampin     Couldn't breath     Current Outpatient Prescriptions on File Prior to Visit  Medication Sig Dispense Refill  . atorvastatin (LIPITOR) 10 MG tablet Take 5 mg by mouth daily.    . fluticasone (FLONASE) 50 MCG/ACT nasal spray Place 2 sprays into both nostrils daily.    . Polyvinyl Alcohol-Povidone (REFRESH OP) Apply 1 capsule to eye daily as needed. For dry eyes    . Probiotic CAPS Take 1 capsule by mouth daily.     No current facility-administered medications on file prior to visit.     BP 111/70 (BP Location: Left Arm, Cuff Size: Normal)   Pulse 74   Temp 97.8 F (36.6 C) (Oral)   Ht 4\' 11"  (1.499 m)   Wt 92 lb 9.6 oz (42 kg)   SpO2 95%   BMI 18.70 kg/m      Objective:   Physical Exam  Constitutional: She appears well-developed and well-nourished. She is cooperative.  HENT:  Head: Normocephalic and atraumatic.  Right Ear: Tympanic membrane, external ear and ear canal normal. Tympanic membrane is not erythematous, not retracted and not bulging.  Mouth/Throat: Mucous membranes are normal. Posterior oropharyngeal erythema present.  Unable to see TM on L ear secondary to cerumen   Cardiovascular: Normal rate, regular rhythm and normal heart sounds.   Pulmonary/Chest: Effort normal.  Occasional bronchial sounds, cleared with cough, no wheeze, no accessory  muscle usage noted  Lymphadenopathy:    She has no cervical adenopathy.  Neurological: She is alert.  Nursing note and vitals reviewed.  Chest xray PA and lateral: no acute cardiopulmonary disease     Assessment & Plan:  Cough - discussed patient's care with PCP, Dr. Janett Billow Copland. Patient is clinically stable, vitals normal. CXR without any acute infiltrates and actually somewhat improved since December 2017's xray per radiology report. The only medication patient is agreeable to try is Tessalon, ordered and recommended she try it for her cough if she can. She was advised to follow up with Dr. Lorelei Pont if her symptoms do not improve or if they get worse. Follow-up with Dr. Melvyn Novas as scheduled in two weeks.  Inda Coke PA-C 11/26/16

## 2016-11-26 NOTE — Progress Notes (Signed)
Pre visit review using our clinic review tool, if applicable. No additional management support is needed unless otherwise documented below in the visit note. 

## 2016-11-27 ENCOUNTER — Ambulatory Visit: Payer: Medicare Other | Admitting: Internal Medicine

## 2016-11-28 ENCOUNTER — Telehealth: Payer: Self-pay | Admitting: Internal Medicine

## 2016-11-28 ENCOUNTER — Ambulatory Visit: Payer: Medicare Other | Admitting: Internal Medicine

## 2016-11-28 NOTE — Telephone Encounter (Signed)
It is fine to use tessalon but we can't treat her any further over the phone at this point will need ov with all meds in had first

## 2016-11-28 NOTE — Telephone Encounter (Signed)
Spoke with the pt  She states "cough has been terrible"  She states that she was seen Dr PCP yesterday and given tessalon pearles, but she wants MW's okay before she tries this  She also states " I have never had reflux, so I don't need to take acid pills" I asked that she re-review her AVS which clearly states: GERD (REFLUX)  is an extremely common cause of respiratory symptoms just like yours , many times with no obvious heartburn at all.   She does not seem to understand this  There have been multiple calls with various questions since her last visit  I advised she simply needs to come in and bring all of her meds so that we can re-evaluate things  She refuses due to lack of transportation and just wants to know if MW thinks the tessalon would help  Please advise, thanks

## 2016-11-28 NOTE — Telephone Encounter (Signed)
Pt aware of MW recommendations.  Pt voiced her understanding and had no further questions.  Nothing further needed.

## 2016-11-28 NOTE — Telephone Encounter (Signed)
Patient stated she do not have heartburn or acid reflux. Patient stated she need a prescription for cough.

## 2016-11-30 ENCOUNTER — Ambulatory Visit: Payer: Medicare Other | Admitting: Internal Medicine

## 2016-12-04 ENCOUNTER — Telehealth: Payer: Self-pay | Admitting: *Deleted

## 2016-12-04 NOTE — Telephone Encounter (Signed)
Spoke to patient 's son . Son would like to wait until patient see's Dr Sallyanne Kuster before scheduling any further test.

## 2016-12-04 NOTE — Telephone Encounter (Signed)
LET MESSAGE FOR SON TO CONTACT BACK  WOULD LIKE to schedule echo prior appointment with Dr Sallyanne Kuster 1/01/17/17

## 2016-12-11 ENCOUNTER — Ambulatory Visit: Payer: Medicare Other | Admitting: Internal Medicine

## 2016-12-12 ENCOUNTER — Encounter: Payer: Medicare Other | Admitting: Cardiovascular Disease

## 2016-12-12 ENCOUNTER — Encounter: Payer: Self-pay | Admitting: Internal Medicine

## 2016-12-12 ENCOUNTER — Ambulatory Visit (INDEPENDENT_AMBULATORY_CARE_PROVIDER_SITE_OTHER): Payer: Medicare HMO | Admitting: Internal Medicine

## 2016-12-12 VITALS — BP 120/74 | HR 95 | Ht 59.0 in | Wt 93.4 lb

## 2016-12-12 DIAGNOSIS — R938 Abnormal findings on diagnostic imaging of other specified body structures: Secondary | ICD-10-CM

## 2016-12-12 DIAGNOSIS — R058 Other specified cough: Secondary | ICD-10-CM

## 2016-12-12 DIAGNOSIS — R9389 Abnormal findings on diagnostic imaging of other specified body structures: Secondary | ICD-10-CM

## 2016-12-12 DIAGNOSIS — R05 Cough: Secondary | ICD-10-CM | POA: Diagnosis not present

## 2016-12-12 NOTE — Patient Instructions (Addendum)
Ok to continue to take the mucinex as needed as per the bottle   If coughing or breathing problems recur in the future please call for appointment  to see me or my Nurse practioner and bring all medications with you - we cannot attempt to treat you over the phone or without a full and  accurate inventory of everything you are taking   Pulmonary follow up is as needed

## 2016-12-12 NOTE — Progress Notes (Signed)
Subjective:     Patient ID: Tracey Holt, female   DOB: 09-16-1922,  MRN: [id number]     Brief patient profile:  89 yowf quit smoking 1977 healthy then and bad pna while living in [place] in early 2000's ? Dx of sarcoid by pulmonary doctor in [place] referred to pulmonary clinic 10/29/2016 by Dr   Edilia Bo for abn ct chest s/p CPR in Sept 5 2017 resulting in sev rib fx on L    History of Present Illness  10/29/2016 1st [facility] office visit/ Milik Gilreath   Chief Complaint  Patient presents with  . Pulmonary Consult    Referred by Dr. Silvestre Mesi for eval of abnormal chest ct. Pt c/o occ cough with yellow to clear sputum.    recent hx starts with acute cough x one week then to ER 09/11/16 cough was progressively worse over a period of several days to weeks and / felt bad/tired sob > rx zmax [facility] who rec 3 more days of zmax and pt gradually improved back to baseline with CT ordered for L sided cp that has subsequently resolved (? mscp from coughing from cap) and ct suggested mass on 10/05/16 but PET suggesting lipoid pna lingula Pt back to baseline now, minimal am mucus, never bloody or really purulent, no fever or flare with food or h/o aspiration witnessed rec  schedule sinus CT > neg/ allergy profile neg       11/09/2016  f/u ov/Eann Cleland re: uacs? Lipoid pna/ no longer on GERD rx but "lots of supplements"  Chief Complaint  Patient presents with  . Acute Visit    Pt c/o increased SOB and cough since the last visit. Her cough is prod with yellow sputum.    sob ever since cough worse/ wakes up when time to get up not prematurely  / already tried zmax  Sob and cough Worse off ppi  rec Pantoprazole (protonix) 40 mg   Take  30-60 min before first meal of the day and Pepcid (famotidine)  20 mg one @  bedtime until return to office - this is the best way to tell whether stomach acid is contributing to your problem.  GERD diet   Please remember to  go to the lab and x-ray department downstairs for your tests - we will call you with the results when they are available. Stop all supplements for the next month  Change appt to 4 weeks from now but you need to bring all your medications with you including any over the counter      12/12/2016  f/u ov/Danyal Whitenack re: did not take meds / confused with instructions/ not on gerd rx/ mucinex working fine  Chief Complaint  Patient presents with  . Follow-up    Cough has improved some with taking mucinex. No new co's today.      Not limited by breathing from desired activities  But very sedentary  No obvious day to day or daytime variability or assoc excess/ purulent sputum or mucus plugs or hemoptysis or cp or chest tightness, subjective wheeze or overt sinus or hb symptoms. No unusual exp hx or h/o childhood pna/ asthma or knowledge of premature birth.  Sleeping ok without nocturnal  or early am exacerbation  of respiratory  c/o's or need for noct saba. Also denies any obvious fluctuation of symptoms with weather or environmental changes or other aggravating or alleviating factors except as outlined above   Current Medications, Allergies, Complete  Past Medical History, Past Surgical History, Family History, and Social History were reviewed in [employer] record.  ROS  The following are not active complaints unless bolded sore throat, dysphagia, dental problems, itching, sneezing,  nasal congestion or excess/ purulent secretions, ear ache,   fever, chills, sweats, unintended wt loss, classically pleuritic or exertional cp,  orthopnea pnd or leg swelling, presyncope, palpitations, abdominal pain, anorexia, nausea, vomiting, diarrhea  or change in bowel or bladder habits, change in stools or urine, dysuria,hematuria,  rash, arthralgias, visual complaints, headache, numbness, weakness or ataxia or problems with walking or coordination,  change in mood/affect or memory.                   Objective:   Physical Exam    amb pleasant wf nad    12/12/2016       93   11/09/2016    91   10/29/16 93 lb 6.4 oz (42.4 kg)  10/15/16 93 lb (42.2 kg)  10/03/16 92 lb 9.6 oz (42 kg)    Vital signs reviewed  - Note on arrival 02 sats  96% on RA     HEENT: nl dentition, turbinates, and oropharynx. Nl external ear canals without cough reflex   NECK :  without JVD/Nodes/TM/ nl carotid upstrokes bilaterally   LUNGS: no acc muscle use,Severely kyphotic but  clear to A and P bilaterally without cough on insp or exp maneuvers   CV:  RRR  no s3 or murmur or increase in P2, nad no edema   ABD:  soft and nontender with nl inspiratory excursion in the supine position. No bruits or organomegaly appreciated, bowel sounds nl  MS:  Nl gait/ ext warm without deformities, calf tenderness, cyanosis or clubbing No obvious joint restrictions   SKIN: warm and dry without lesions    NEURO:  alert, approp, nl sensorium with  no motor or cerebellar deficits apparent.      I personally reviewed images and agree with radiology impression as follows:  CT PET  Scan 10/12/16 1. The lingular process demonstrated on the recent study is hypermetabolic. However, this demonstrates ill-defined central fat density and could be inflammatory (lipoid pneumonia). Malignancy cannot be completely excluded. 2. The right middle lobe nodule is not hypermetabolic, favoring a benign etiology. 3. No hypermetabolic adenopathy or other suspicious findings. 4. Chest CT follow-up in 3-6 months recommended.   CXR 11/09/2016  1.  Stable left mid lung field mass lesion and/or infiltrate.  2. Cardiac pacer with lead tips in right atrium right ventricle. Heart size s                Assessment:

## 2016-12-16 NOTE — Assessment & Plan Note (Addendum)
Sinus CT 11/06/2016 >>> No evidence of sinusitis. Allergy profile 10/29/2016 >  Eos 0.1 /  IgE  13 neg RAST  Controlled with prn mucinex so no further w/u needed at age 83 > f/u can be prn  I had an extended final summary discussion with the patient reviewing all relevant studies completed to date and  lasting 10 minutes of a 15 minute visit on the following issues:   Not feasible to treat further with such poor medication reconciliation process so if any further pulmonary problems needs to return with all meds in hand using a trust but verify approach to confirm accurate Medication  Reconciliation The principal here is that until we are certain that the  patients are doing what we've asked, it makes no sense to ask them to do more.

## 2016-12-16 NOTE — Assessment & Plan Note (Signed)
RML scarring since 2010, neg on PET  10/12/16 - new lingular density following cpr 07/24/2016 ? Lipoid pna per PET 10/12/16  - cxr 11/09/2016 no change   Presently s symptoms / findings are c/w benign process, so no further w/u needed

## 2016-12-17 ENCOUNTER — Encounter: Payer: Self-pay | Admitting: Allergy and Immunology

## 2016-12-17 ENCOUNTER — Ambulatory Visit (INDEPENDENT_AMBULATORY_CARE_PROVIDER_SITE_OTHER): Payer: Medicare HMO | Admitting: Allergy and Immunology

## 2016-12-17 VITALS — BP 110/80 | HR 88 | Temp 98.2°F | Resp 16 | Ht <= 58 in | Wt 90.4 lb

## 2016-12-17 DIAGNOSIS — J310 Chronic rhinitis: Secondary | ICD-10-CM | POA: Insufficient documentation

## 2016-12-17 DIAGNOSIS — J300 Vasomotor rhinitis: Secondary | ICD-10-CM

## 2016-12-17 DIAGNOSIS — T783XXA Angioneurotic edema, initial encounter: Secondary | ICD-10-CM

## 2016-12-17 MED ORDER — AZELASTINE HCL 0.15 % NA SOLN: 1.0000 | Freq: Two times a day (BID) | NASAL | 3 refills | Status: DC | PRN

## 2016-12-17 NOTE — Assessment & Plan Note (Signed)
Unclear etiology.  Food allergen skin testing was negative today despite a positive histamine control.  The patient is not taking an ACE inhibitor. NSAIDs may exacerbate angioedema but in this case are not the underlying etiology as demonstrated by the fact that the patient has experienced angioedema in the absence of NSAIDs. There are no concomitant symptoms concerning for anaphylaxis or constitutional symptoms worrisome for an underlying malignancy.  Will order labs to rule out hereditary angioedema, acquired angioedema, and other potential etiologies.    The following labs have been ordered: tryptase, C4, C1 esterase inhibitor (quantitative and functional), C1q, factor XII, CMP, and galactose-alpha-1,3-galactose IgE level.  She had a CBC drawn on 11/09/2016, therefore this study will not be repeated at this time.  The patient will be notified with further recommendations after lab results have returned.  Should symptoms recur, a  journal is to be kept recording any foods eaten, beverages consumed, medications taken, activities performed, and environmental conditions within a 6 hour period prior to the onset of symptoms. For any symptoms concerning for anaphylaxis, 911 is to be called immediately.

## 2016-12-17 NOTE — Assessment & Plan Note (Addendum)
Non-allergic rhinitis.  All seasonal and perennial aeroallergen skin tests are negative despite a positive histamine control.  Intranasal steroids and intranasal antihistamines are effective for symptoms associated with non-allergic rhinitis, whereas second generation antihistamines such as cetirizine, loratadine and fexofenadine have been found to be ineffective for this condition.  A prescription has been provided for azelastine nasal spray, 1-2 sprays per nostril 2 times daily as needed. Proper nasal spray technique has been discussed and demonstrated.   Nasal saline lavage (NeilMed) has been recommended prior to medicated nasal sprays and as needed along with instructions for proper administration.  For thick post nasal drainage, add guaifenesin 600 mg (Mucinex)  twice daily as needed with adequate hydration as discussed.

## 2016-12-17 NOTE — Progress Notes (Signed)
New Patient Note  RE: Tracey Holt MRN: [id number] DOB: 03-21-1922 Date of Office Visit: 12/17/2016  Referring provider: Elise Benne Primary care provider: Lamar Blinks, MD  Chief Complaint: Allergic Reaction (Referred by Dr. Harvie Heck for lip swelling on 11/13/16. )   History of present illness: Tracey Holt is a 83 y.o. female seen today in consultation requested by Mackie Pai, PA-C.  She reports that she woke up on the morning of 11/13/2016 with swelling of the upper lip.  She denies concomitant urticaria, cardiopulmonary symptoms, or GI symptoms. No specific medication, food, skin care product, detergent, soap, or other environmental triggers have been identified.  Because the swelling occurred on her upper lip, she is curious if her denture cleanser may have triggered the reaction, however she has used the same product for many years. She has not been on an ACE inhibitor. She experiences postnasal drainage "all the time", occasionally causing episodes of coughing, as well as nasal congestion and rhinorrhea. No significant seasonal symptom variation has been noted nor have specific environmental triggers been identified.  She takes loratadine daily and fluticasone nasal spray in an attempt to control these symptoms.   Assessment and plan: Angioedema Unclear etiology.  Food allergen skin testing was negative today despite a positive histamine control.  The patient is not taking an ACE inhibitor. NSAIDs may exacerbate angioedema but in this case are not the underlying etiology as demonstrated by the fact that the patient has experienced angioedema in the absence of NSAIDs. There are no concomitant symptoms concerning for anaphylaxis or constitutional symptoms worrisome for an underlying malignancy.  Will order labs to rule out hereditary angioedema, acquired angioedema, and other potential etiologies.    The following labs have been ordered: tryptase, C4, C1 esterase inhibitor  (quantitative and functional), C1q, factor XII, CMP, and galactose-alpha-1,3-galactose IgE level.  She had a CBC drawn on 11/09/2016, therefore this study will not be repeated at this time.  The patient will be notified with further recommendations after lab results have returned.  Should symptoms recur, a  journal is to be kept recording any foods eaten, beverages consumed, medications taken, activities performed, and environmental conditions within a 6 hour period prior to the onset of symptoms. For any symptoms concerning for anaphylaxis, 911 is to be called immediately.  Chronic rhinitis Non-allergic rhinitis.  All seasonal and perennial aeroallergen skin tests are negative despite a positive histamine control.  Intranasal steroids and intranasal antihistamines are effective for symptoms associated with non-allergic rhinitis, whereas second generation antihistamines such as cetirizine, loratadine and fexofenadine have been found to be ineffective for this condition.  A prescription has been provided for azelastine nasal spray, 1-2 sprays per nostril 2 times daily as needed. Proper nasal spray technique has been discussed and demonstrated.   Nasal saline lavage (NeilMed) has been recommended prior to medicated nasal sprays and as needed along with instructions for proper administration.  For thick post nasal drainage, add guaifenesin 600 mg (Mucinex)  twice daily as needed with adequate hydration as discussed.   Meds ordered this encounter  Medications  . Azelastine HCl 0.15 % SOLN    Sig: Place 1-2 sprays into the nose 2 (two) times daily as needed.    Dispense:  30 mL    Refill:  3    Diagnostics: Environmental skin testing:  Negative despite a positive histamine control. Food allergen skin testing:  Negative despite a positive histamine control.    Physical examination: Blood pressure 110/80, pulse 88, temperature 98.2  F (36.8 C), temperature source Oral, resp. rate 16, height  4\' 8"  (1.422 m), weight 90 lb 6.4 oz (41 kg), SpO2 96 %.  General: Alert, interactive, in no acute distress. HEENT: Turbinates mildly edematous without discharge, post-pharynx moderately erythematous. Neck: Supple without lymphadenopathy. Lungs: Clear to auscultation without wheezing, rhonchi or rales. CV: Normal S1, S2 without murmurs. Abdomen: Nondistended, nontender. Skin: Warm and dry, without lesions or rashes. Extremities:  No clubbing, cyanosis or edema. Neuro:   Grossly intact.  Review of systems:  Review of systems negative except as noted in HPI / PMHx or noted below: Review of Systems  Constitutional: Negative.   HENT: Negative.   Eyes: Negative.   Respiratory: Negative.   Cardiovascular: Negative.   Gastrointestinal: Negative.   Genitourinary: Negative.   Musculoskeletal: Negative.   Skin: Negative.   Neurological: Negative.   Endo/Heme/Allergies: Negative.   Psychiatric/Behavioral: Negative.     Past medical history:  Past Medical History:  Diagnosis Date  . Bradycardic cardiac arrest ([place]) 07/24/2016   Required CPR, intubation with multiple rib fractures. Had short term cardiac shock with acute heart failure.  Resulted in possible stress-induced cardiomyopathy  . Cardiac related syncope 07/24/2016   Bradycardic arrest requiring CPR 2 with transvenous pacemaker placed followed by permanent pacemaker; complicated by respiratory arrest, type II MI, stress-induced cardiopathy  . Cardiomyopathy ([place]) 07/2016   Moderate severely reduced EF with apical hypokinesis suggestive of stress-induced cardiac myopathy versus multivessel CAD --> in setting of her to cardiac arrest  . COPD (chronic obstructive pulmonary disease) ([place])    By report  . History of chronic constipation   . Hypercholesterolemia   . Osteoporosis   . Pacemaker 07/26/2016   Abbott dual-chamber pacemaker: [place] [id number] - Serial [id number]  . Pneumonia 08/2016  . Sarcoidosis of lung  ([place])   . Spinal stenosis     Past surgical history:  Past Surgical History:  Procedure Laterality Date  . ADENOIDECTOMY    . PACEMAKER INSERTION Left 07/26/2016   Abbott Assurity Model [id number] - Serial [id number] -- complicated by pneumothorax requiring chest tube placement  . TEE WITHOUT CARDIOVERSION  02/2013   EF 55-60% with normal global function. No thrombus, mass or vegetation. Trivial-small pericardial effusion. Moderate LA dilation. Mild MR.  . TONSILLECTOMY    . TRANSTHORACIC ECHOCARDIOGRAM  07/24/2016   [facility]: EF 45-50%. Mid and apical inferior, inferolateral and apical hypokinesis. GR 2 DD. Mild LA and moderate RA dilation. Mild paracardial effusion. Moderate to severe TR. Basal and mid segment hypokinesis with apical hypokinesis. - Suspect stress-induced cardiopathy versus multivessel CAD.  Marland Kitchen TRANSTHORACIC ECHOCARDIOGRAM  07/26/2016   [place]: LIMITED (post PPM) - although the report suggested EF 30-35% with apical akinesis, rounding note suggested this was an improvement    Family history: Family History  Problem Relation Age of Onset  . Allergic rhinitis Neg Hx   . Angioedema Neg Hx   . Asthma Neg Hx   . Eczema Neg Hx   . Immunodeficiency Neg Hx   . Urticaria Neg Hx     Social history: Social History   Social History  . Marital status: Widowed    Spouse name: N/A  . Number of children: N/A  . Years of education: N/A   Occupational History  . Not on file.   Social History Main Topics  . Smoking status: Former Smoker    Packs/day: 1.50    Years: 17.00    Types: Cigarettes  Start date: 12/18/1963    Quit date: 11/20/1983  . Smokeless tobacco: Never Used  . Alcohol use No  . Drug use: No  . Sexual activity: Not Currently   Other Topics Concern  . Not on file   Social History Narrative   Recently moved to [place] from [place] to live near her daughter.   Environmental History: The  patient lives in an apartment with laminate floors throughout, gas heat, and central air.  She has no pets.  She is a former cigarette smoker having quit in 1985 with a 30-pack-year history.  Allergies as of 12/17/2016      Reactions   Levaquin [levofloxacin In D5w]    Couldn't breath    Penicillins    Arms swelling    Rifampin    Couldn't breath       Medication List       Accurate as of 12/17/16  6:29 PM. Always use your most recent med list.          aspirin EC 81 MG tablet Take 81 mg by mouth daily.   atorvastatin 10 MG tablet Commonly known as:  LIPITOR Take 5 mg by mouth daily.   Azelastine HCl 0.15 % Soln Place 1-2 sprays into the nose 2 (two) times daily as needed.   Biotin 10000 MCG Tabs Take 1 capsule by mouth daily.   CITRACAL + D PO Take 1 tablet by mouth 2 (two) times daily.   co-enzyme Q-10 30 MG capsule Take 60 mg by mouth daily.   docusate sodium 100 MG capsule Commonly known as:  COLACE Take 100 mg by mouth daily.   fluticasone 50 MCG/ACT nasal spray Commonly known as:  FLONASE Place 1 spray into both nostrils daily.   GLUCOS-CHONDROIT-MSM COMPLEX Tabs Take 2 tablets by mouth daily.   guaiFENesin 600 MG 12 hr tablet Commonly known as:  MUCINEX Take 600 mg by mouth at bedtime.   loratadine 10 MG tablet Commonly known as:  CLARITIN Take 10 mg by mouth daily.   LUTEIN PO Take 1 tablet by mouth daily.   multivitamin with minerals tablet Take 1 tablet by mouth daily.   polyethylene glycol packet Commonly known as:  MIRALAX / GLYCOLAX Take 17 g by mouth daily.   Probiotic Caps Take 1 capsule by mouth daily.   REFRESH EYE ITCH RELIEF OP Apply 1 drop to eye 2 (two) times daily.   senna 8.6 MG tablet Commonly known as:  SENOKOT Take 1 tablet by mouth daily.   vitamin C 1000 MG tablet Take 1,000 mg by mouth daily.       Known medication allergies: Allergies  Allergen Reactions  . Levaquin [Levofloxacin In D5w]     Couldn't  breath   . Penicillins     Arms swelling   . Rifampin     Couldn't breath     I appreciate the opportunity to take part in Adina's care. Please do not hesitate to contact me with questions.  Sincerely,   R. Edgar Frisk, MD

## 2016-12-17 NOTE — Patient Instructions (Addendum)
Angioedema Unclear etiology.  Food allergen skin testing was negative today despite a positive histamine control.  The patient is not taking an ACE inhibitor. NSAIDs may exacerbate angioedema but in this case are not the underlying etiology as demonstrated by the fact that the patient has experienced angioedema in the absence of NSAIDs. There are no concomitant symptoms concerning for anaphylaxis or constitutional symptoms worrisome for an underlying malignancy.  Will order labs to rule out hereditary angioedema, acquired angioedema, and other potential etiologies.    The following labs have been ordered: tryptase, C4, C1 esterase inhibitor (quantitative and functional), C1q, factor XII, CMP, and galactose-alpha-1,3-galactose IgE level.  She had a CBC drawn on 11/09/2016, therefore this study will not be repeated at this time.  The patient will be notified with further recommendations after lab results have returned.  Should symptoms recur, a  journal is to be kept recording any foods eaten, beverages consumed, medications taken, activities performed, and environmental conditions within a 6 hour period prior to the onset of symptoms. For any symptoms concerning for anaphylaxis, 911 is to be called immediately.  Chronic rhinitis Non-allergic rhinitis.  All seasonal and perennial aeroallergen skin tests are negative despite a positive histamine control.  Intranasal steroids and intranasal antihistamines are effective for symptoms associated with non-allergic rhinitis, whereas second generation antihistamines such as cetirizine, loratadine and fexofenadine have been found to be ineffective for this condition.  A prescription has been provided for azelastine nasal spray, 1-2 sprays per nostril 2 times daily as needed. Proper nasal spray technique has been discussed and demonstrated.   Nasal saline lavage (NeilMed) has been recommended prior to medicated nasal sprays and as needed along with instructions  for proper administration.  For thick post nasal drainage, add guaifenesin 600 mg (Mucinex)  twice daily as needed with adequate hydration as discussed.   When lab results have returned the patient will be called with further recommendations and follow up instructions.

## 2016-12-18 ENCOUNTER — Other Ambulatory Visit: Payer: Self-pay | Admitting: Allergy and Immunology

## 2016-12-18 DIAGNOSIS — J300 Vasomotor rhinitis: Secondary | ICD-10-CM

## 2016-12-18 LAB — COMPREHENSIVE METABOLIC PANEL
ALT: 36 U/L — ABNORMAL HIGH (ref 6–29)
AST: 39 U/L — ABNORMAL HIGH (ref 10–35)
Albumin: 4.1 g/dL (ref 3.6–5.1)
Alkaline Phosphatase: 61 U/L (ref 33–130)
BUN: 21 mg/dL (ref 7–25)
CO2: 25 mmol/L (ref 20–31)
Calcium: 9.4 mg/dL (ref 8.6–10.4)
Chloride: 97 mmol/L — ABNORMAL LOW (ref 98–110)
Creat: 0.57 mg/dL — ABNORMAL LOW (ref 0.60–0.88)
Glucose, Bld: 92 mg/dL (ref 65–99)
Potassium: 4.6 mmol/L (ref 3.5–5.3)
Sodium: 137 mmol/L (ref 135–146)
Total Bilirubin: 0.5 mg/dL (ref 0.2–1.2)
Total Protein: 6.9 g/dL (ref 6.1–8.1)

## 2016-12-18 LAB — C4 COMPLEMENT: C4 Complement: 21 mg/dL (ref 16–47)

## 2016-12-18 MED ORDER — AZELASTINE HCL 0.15 % NA SOLN: 1.0000 | Freq: Two times a day (BID) | NASAL | 3 refills | Status: DC | PRN

## 2016-12-18 NOTE — Telephone Encounter (Signed)
Patient saw Dr. Verlin Fester yesterday, 12-17-16, and said he was going to send her in a prescription for azelastine. She said she called her pharmacy, [facility] [address] and they have not received it yet.

## 2016-12-18 NOTE — Telephone Encounter (Signed)
Called [facility] pharmacy and they said they didn't receive the prescription for azelastine; on our side it said we sent it. I re-sent the prescription. I also called the patient and left a message to call us back in regards to the medication being sent.

## 2016-12-19 ENCOUNTER — Ambulatory Visit (INDEPENDENT_AMBULATORY_CARE_PROVIDER_SITE_OTHER): Payer: Medicare HMO | Admitting: Cardiovascular Disease

## 2016-12-19 ENCOUNTER — Ambulatory Visit: Payer: Medicare Other | Admitting: Family Medicine

## 2016-12-19 ENCOUNTER — Ambulatory Visit (INDEPENDENT_AMBULATORY_CARE_PROVIDER_SITE_OTHER): Payer: Medicare HMO | Admitting: Family Medicine

## 2016-12-19 ENCOUNTER — Telehealth: Payer: Self-pay | Admitting: Cardiology

## 2016-12-19 ENCOUNTER — Encounter: Payer: Self-pay | Admitting: Family Medicine

## 2016-12-19 VITALS — BP 112/72 | HR 71 | Ht <= 58 in | Wt 92.0 lb

## 2016-12-19 VITALS — BP 132/72 | HR 86 | Temp 97.8°F | Ht <= 58 in | Wt 92.0 lb

## 2016-12-19 DIAGNOSIS — R140 Abdominal distension (gaseous): Secondary | ICD-10-CM

## 2016-12-19 DIAGNOSIS — I5022 Chronic systolic (congestive) heart failure: Secondary | ICD-10-CM

## 2016-12-19 DIAGNOSIS — M79675 Pain in left toe(s): Secondary | ICD-10-CM

## 2016-12-19 DIAGNOSIS — L97519 Non-pressure chronic ulcer of other part of right foot with unspecified severity: Secondary | ICD-10-CM | POA: Diagnosis not present

## 2016-12-19 DIAGNOSIS — Z950 Presence of cardiac pacemaker: Secondary | ICD-10-CM | POA: Diagnosis not present

## 2016-12-19 DIAGNOSIS — M205X2 Other deformities of toe(s) (acquired), left foot: Secondary | ICD-10-CM

## 2016-12-19 DIAGNOSIS — L308 Other specified dermatitis: Secondary | ICD-10-CM

## 2016-12-19 DIAGNOSIS — I442 Atrioventricular block, complete: Secondary | ICD-10-CM

## 2016-12-19 LAB — FACTOR 12 ASSAY: Factor XII Activity: 88 % (ref 50–150)

## 2016-12-19 MED ORDER — FLUOCINONIDE 0.05 % EX OINT: 1.0000 "application " | TOPICAL_OINTMENT | Freq: Two times a day (BID) | CUTANEOUS | 0 refills | Status: DC

## 2016-12-19 NOTE — Patient Instructions (Signed)
Try the ointment 1-2x a day for the spots on your feet.  If these are not clearing up in 7-10 days please let me know You can purchase some toe protectors for your crossed toe on [employer].  Also, I would encourage you to use shoes with plenty of room for the toes most of the time.   You can save your cute shoes for going out!    Take care

## 2016-12-19 NOTE — Progress Notes (Signed)
Cardiology Office Note    Date:  12/20/2016   ID:  Tracey Holt, DOB 1922/11/15, MRN [id number]  PCP:  Lamar Blinks, MD  Cardiologist:   Sanda Klein, MD   Chief Complaint  Patient presents with  . Follow-up    pt reports increased shortness of breath with activity    History of Present Illness:  Tracey Holt is a 83 y.o. female with complete heart block leading to cardiac arrest in September 2017 and implantation of a dual-chamber permanent pacemaker in [place]. She also has a history of sarcoidosis, hypertension, COPD, osteoporosis, spinal stenosis and mild mitral insufficiency. She has seen Dr. Ellyn Hack but I will be monitoring her pacemaker starting today.  In September 2017 she had a bradycardiac arrest requiring CPR and placement of a transvenous pacemaker; did have mildly elevated cardiac enzymes and immediately following these events LV ejection fraction was 40-45% with apical hypokinesis. Also mentioned was grade 2 diastolic dysfunction. Pacemaker implantation was complicated by a left-sided pneumothorax from which she eventually recovered. In October 2017 she was treated for pneumonia.  Her pacemaker is a Personnel officer MRI conditional device implanted by Dr. Tereso Newcomer. At current settings anticipated generator longevity 7.9 years. She has 29% atrial pacing and 100% ventricular pacing. There is no underlying ventricular rhythm. 6 episodes of mode switch have been recorded. The longest is only 10 seconds and they all appear consistent with paroxysmal atrial tachycardia at roughly 200 bpm. She is incredibly active for a 83 year old (10 hours/day).  8 parameters are excellent. Atrial lead sensing 4.0 mV, impedance 444 ohms, threshold 0.75 V at 0.4 ms. Ventricular lead impedance 450 ohms, threshold 0.5 V is 0.4 ms pulse width. There are no detectable R waves.  She generally feels well and denies any problems with dizziness or syncope since the pacemaker was implanted. She does not  have dyspnea, angina, lower extremity edema or intermittent claudication. She has had some problems with discoloration of her toes and is seeing a podiatrist. She has great pulses in her feet on exam.    Past Medical History:  Diagnosis Date  . Bradycardic cardiac arrest ([place]) 07/24/2016   Required CPR, intubation with multiple rib fractures. Had short term cardiac shock with acute heart failure.  Resulted in possible stress-induced cardiomyopathy  . Cardiac related syncope 07/24/2016   Bradycardic arrest requiring CPR 2 with transvenous pacemaker placed followed by permanent pacemaker; complicated by respiratory arrest, type II MI, stress-induced cardiopathy  . Cardiomyopathy ([place]) 07/2016   Moderate severely reduced EF with apical hypokinesis suggestive of stress-induced cardiac myopathy versus multivessel CAD --> in setting of her to cardiac arrest  . COPD (chronic obstructive pulmonary disease) ([place])    By report  . History of chronic constipation   . Hypercholesterolemia   . Osteoporosis   . Pacemaker 07/26/2016   Abbott dual-chamber pacemaker: [place] [id number] - Serial [id number]  . Pneumonia 08/2016  . Sarcoidosis of lung ([place])   . Spinal stenosis     Past Surgical History:  Procedure Laterality Date  . ADENOIDECTOMY    . PACEMAKER INSERTION Left 07/26/2016   Abbott Assurity Model [id number] - Serial [id number] -- complicated by pneumothorax requiring chest tube placement  . TEE WITHOUT CARDIOVERSION  02/2013   EF 55-60% with normal global function. No thrombus, mass or vegetation. Trivial-small pericardial effusion. Moderate LA dilation. Mild MR.  . TONSILLECTOMY    . TRANSTHORACIC ECHOCARDIOGRAM  07/24/2016   [facility]: EF 45-50%. Mid and apical inferior, inferolateral  and apical hypokinesis. GR 2 DD. Mild LA and moderate RA dilation. Mild paracardial effusion. Moderate to severe TR. Basal and mid segment hypokinesis with apical hypokinesis.  - Suspect stress-induced cardiopathy versus multivessel CAD.  Marland Kitchen TRANSTHORACIC ECHOCARDIOGRAM  07/26/2016   [place]: LIMITED (post PPM) - although the report suggested EF 30-35% with apical akinesis, rounding note suggested this was an improvement    Current Medications: Outpatient Medications Prior to Visit  Medication Sig Dispense Refill  . Ascorbic Acid (VITAMIN C) 1000 MG tablet Take 1,000 mg by mouth daily.    Marland Kitchen aspirin EC 81 MG tablet Take 81 mg by mouth daily.    Marland Kitchen atorvastatin (LIPITOR) 10 MG tablet Take 5 mg by mouth daily.    . Azelastine HCl 0.15 % SOLN Place 1-2 sprays into the nose 2 (two) times daily as needed. 30 mL 3  . Biotin 10000 MCG TABS Take 1 capsule by mouth daily.    . Calcium Citrate-Vitamin D (CITRACAL + D PO) Take 1 tablet by mouth 2 (two) times daily.    Marland Kitchen co-enzyme Q-10 30 MG capsule Take 60 mg by mouth daily.     Marland Kitchen docusate sodium (COLACE) 100 MG capsule Take 100 mg by mouth daily.    . fluticasone (FLONASE) 50 MCG/ACT nasal spray Place 1 spray into both nostrils daily.     Marland Kitchen guaiFENesin (MUCINEX) 600 MG 12 hr tablet Take 600 mg by mouth at bedtime.    Marland Kitchen Ketotifen Fumarate (REFRESH EYE ITCH RELIEF OP) Apply 1 drop to eye 2 (two) times daily.    Marland Kitchen loratadine (CLARITIN) 10 MG tablet Take 10 mg by mouth daily.    . LUTEIN PO Take 1 tablet by mouth daily.    . Misc Natural Products (GLUCOS-CHONDROIT-MSM COMPLEX) TABS Take 2 tablets by mouth daily.    . Multiple Vitamins-Minerals (MULTIVITAMIN WITH MINERALS) tablet Take 1 tablet by mouth daily.    . polyethylene glycol (MIRALAX / GLYCOLAX) packet Take 17 g by mouth daily.    . Probiotic CAPS Take 1 capsule by mouth daily.    Marland Kitchen senna (SENOKOT) 8.6 MG tablet Take 1 tablet by mouth daily.     No facility-administered medications prior to visit.      Allergies:   Levaquin [levofloxacin in d5w]; Penicillins; and Rifampin   Social History   Social History  . Marital status: Widowed     Spouse name: N/A  . Number of children: N/A  . Years of education: N/A   Social History Main Topics  . Smoking status: Former Smoker    Packs/day: 1.50    Years: 17.00    Types: Cigarettes    Start date: 12/18/1963    Quit date: 11/20/1983  . Smokeless tobacco: Never Used  . Alcohol use No  . Drug use: No  . Sexual activity: Not Currently   Other Topics Concern  . Not on file   Social History Narrative   Recently moved to [place] from [place] to live near her daughter.     Family History:  The patient's Family history is not contributory. There is no family history of early coronary disease or arrhythmia. As far she knows she does not have any relatives with pacemakers.  ROS:   Please see the history of present illness.    ROS All other systems reviewed and are negative.   PHYSICAL EXAM:   VS:  BP 112/72   Pulse 71   Ht 4\' 8"  (1.422 m)  Wt 41.7 kg (92 lb)   BMI 20.63 kg/m    GEN: Very small framed, also very slender and frail-appearing, well developed, in no acute distress  HEENT: normal  Neck: no JVD, carotid bruits, or masses Cardiac: Paradoxical split S2, RRR; no murmurs, rubs, or gallops,no edema , Healthy left subclavian pacemaker site. The pulses in both pedals bilaterally appear to be normal Respiratory:  clear to auscultation bilaterally, normal work of breathing GI: soft, nontender, nondistended, + BS MS: no deformity or atrophy  Skin: warm and dry, no rash Neuro:  Alert and Oriented x 3, Strength and sensation are intact. slightly hard of hearing Psych: euthymic mood, full affect  Wt Readings from Last 3 Encounters:  12/19/16 41.7 kg (92 lb)  12/19/16 41.7 kg (92 lb)  12/17/16 41 kg (90 lb 6.4 oz)      Studies/Labs Reviewed:   EKG:  EKG is ordered today.  The ekg ordered today demonstrates Atrial sensed ventricular paced rhythm. She has very prominent R waves in leads V1 and V2. A single PVC/fusion beat is seen. The QTC is 467 ms.  Recent  Labs: 09/11/2016: ALT 36 10/03/2016: BUN 18; Creatinine, Ser 0.76; Potassium 4.4; Pro B Natriuretic peptide (BNP) 62.0; Sodium 136 11/09/2016: Hemoglobin 13.8; Platelets 213.0; TSH 4.48     ASSESSMENT:    1. CHB (complete heart block) ([place])   2. Pacemaker   3. Chronic systolic congestive heart failure ([place])   4. Ulcer of toe of right foot, unspecified ulcer stage ([place])      PLAN:  In order of problems listed above:  1. CHB: Pacemaker dependent. Discussed the need to avoid sources of strong electromagnetic interference. Her device is MRI conditional and she can have MRI scans with appropriate precautions. 2. PM: Normal device function. Plan remote downloads every 3 months and office visit at least yearly. 3. Left Ventricular dysfunction: It's very likely that she had transient stress cardiomyopathy after her bradycardic arrest. The need to repeat her echocardiogram to confirm this. She does not have signs or symptoms of congestive heart failure. In view of her age I think we would be hard pressed perform any invasive evaluation even if left ventricular function remains depressed. It might however prompt some changes in her medications. Will order an echocardiogram. 4. Right toe ulcer: This is now tiny and seems to be healing well. She has good pulses in the foot and I suspect the ulcer will heal if there is no pressure on it. There is no evidence of surrounding infection of the soft tissues. She is seeing a podiatrist later today.    Medication Adjustments/Labs and Tests Ordered: Current medicines are reviewed at length with the patient today.  Concerns regarding medicines are outlined above.  Medication changes, Labs and Tests ordered today are listed in the Patient Instructions below. Patient Instructions  Dr Sallyanne Kuster recommends that you continue on your current medications as directed. Please refer to the Current Medication list given to you today.  Your physician has requested that  you have an echocardiogram. Echocardiography is a painless test that uses sound waves to create images of your heart. It provides your doctor with information about the size and shape of your heart and how well your heart's chambers and valves are working. This procedure takes approximately one hour. There are no restrictions for this procedure. This will be performed at our [facility] location - [address].  Remote monitoring is used to monitor your Pacemaker of  ICD from home. This monitoring reduces the number of office visits required to check your device to one time per year. It allows [place] to keep an eye on the functioning of your device to ensure it is working properly. You are scheduled for a device check from home on Wednesday, May 2nd, 2018. You may send your transmission at any time that day. If you have a wireless device, the transmission will be sent automatically. After your physician reviews your transmission, you will receive a postcard with your next transmission date.  Dr Sallyanne Kuster recommends that you schedule a follow-up appointment in 12 months with a pacemaker check. You will receive a reminder letter in the mail two months in advance. If you don't receive a letter, please call our office to schedule the follow-up appointment.  If you need a refill on your cardiac medications before your next appointment, please call your pharmacy.    Signed, Sanda Klein, MD  12/20/2016 3:12 PM    [facility] [address], [place], [place]  [address] Phone: [phone]; Fax: [phone]

## 2016-12-19 NOTE — Telephone Encounter (Signed)
Spoke w/ pt about her home monitor. She is very confused. Tried to explain to pt that she had to have the monitor plugged into the wall outlet and the cell adapter plugged into the side of the monitor in order for it to work. She stated the MD was going to have someone call her son and help him set up her monitor. Informed pt that I am are only here Monday through Friday 8:30AM-4:30PM. She stated that her son does not get off until 5 PM. I instructed pt to have son call [employer] tech support to receive help setting up the home monitor. Pt is still very confused but said she would have son call the tech support number.

## 2016-12-19 NOTE — Progress Notes (Signed)
Pre visit review using our clinic review tool, if applicable. No additional management support is needed unless otherwise documented below in the visit note. 

## 2016-12-19 NOTE — Patient Instructions (Signed)
Dr Sallyanne Kuster recommends that you continue on your current medications as directed. Please refer to the Current Medication list given to you today.  Your physician has requested that you have an echocardiogram. Echocardiography is a painless test that uses sound waves to create images of your heart. It provides your doctor with information about the size and shape of your heart and how well your heart's chambers and valves are working. This procedure takes approximately one hour. There are no restrictions for this procedure. This will be performed at our [facility] location - [address], [address].  Remote monitoring is used to monitor your Pacemaker of ICD from home. This monitoring reduces the number of office visits required to check your device to one time per year. It allows [place] to keep an eye on the functioning of your device to ensure it is working properly. You are scheduled for a device check from home on Wednesday, May 2nd, 2018. You may send your transmission at any time that day. If you have a wireless device, the transmission will be sent automatically. After your physician reviews your transmission, you will receive a postcard with your next transmission date.  Dr Sallyanne Kuster recommends that you schedule a follow-up appointment in 12 months with a pacemaker check. You will receive a reminder letter in the mail two months in advance. If you don't receive a letter, please call our office to schedule the follow-up appointment.  If you need a refill on your cardiac medications before your next appointment, please call your pharmacy.

## 2016-12-19 NOTE — Progress Notes (Signed)
[facility] [address] [place], [place] [address] [phone] [id number] [phone]  Date:  12/19/2016   Name:  Tracey Holt   DOB:  17-Mar-1922   MRN:  [id number]  PCP:  Lamar Blinks, MD    Chief Complaint: Foot Swelling (c/o red spots on both feet x 2 weeks ago. Pt states that spots are not painful or itching. Flu vaccine in November.)   History of Present Illness:  Tracey Holt is a 83 y.o. very pleasant female patient who presents with the following:  Last seen here in November. She was also seen earlier this month for a cough- she notes that she is feeling better.  She used tessalon perles and mucinex.  She is not running a fever.  History of COPD Here today with concern of a rash on her feet for about 2 weeks. Left is worse than right.  It does not itch or hurt but she has noted some hyperpigmented spots on her feet  She wears band-aids over the 2nd toe bilaterally as they rub on her shoes She has had a bunionectomy but still has some bunion deformity more on the left Also has a cross over deformity of the 2nd toe on the left and is starting to cross over on the right   After leaving clinic, Tracey Holt approached me in the hall and told me that she has noted abd bloating for a couple of years.  She feels that this gets better after she uses a laxative but does not feel that she is constipated.  She does not have pain. It seems that her lower belly is bloated. She has not had a hysterectomy.  Would suspect fibroids as a likely cause- will get an [place] for her  Patient Active Problem List   Diagnosis Date Noted  . Angioedema 12/17/2016  . Chronic rhinitis 12/17/2016  . Dyspnea 11/09/2016  . Upper airway cough syndrome 10/29/2016  . Abnormal CT of the chest 10/29/2016  . Toe ulcer, right ([place]) 10/17/2016  . Community acquired pneumonia of left lower lobe of lung ([place]) 09/19/2016  . S/P placement of cardiac pacemaker 09/19/2016  . History of  syncope 09/19/2016  . Congestive dilated cardiomyopathy ([place]) 07/24/2016  . Bradycardic cardiac arrest ([place]) 07/24/2016    Past Medical History:  Diagnosis Date  . Bradycardic cardiac arrest ([place]) 07/24/2016   Required CPR, intubation with multiple rib fractures. Had short term cardiac shock with acute heart failure.  Resulted in possible stress-induced cardiomyopathy  . Cardiac related syncope 07/24/2016   Bradycardic arrest requiring CPR 2 with transvenous pacemaker placed followed by permanent pacemaker; complicated by respiratory arrest, type II MI, stress-induced cardiopathy  . Cardiomyopathy ([place]) 07/2016   Moderate severely reduced EF with apical hypokinesis suggestive of stress-induced cardiac myopathy versus multivessel CAD --> in setting of her to cardiac arrest  . COPD (chronic obstructive pulmonary disease) ([place])    By report  . History of chronic constipation   . Hypercholesterolemia   . Osteoporosis   . Pacemaker 07/26/2016   Abbott dual-chamber pacemaker: [place] [id number] - Serial [id number]  . Pneumonia 08/2016  . Sarcoidosis of lung ([place])   . Spinal stenosis     Past Surgical History:  Procedure Laterality Date  . ADENOIDECTOMY    . PACEMAKER INSERTION Left 07/26/2016   Abbott Assurity Model [id number] - Serial [id number] -- complicated by pneumothorax requiring chest tube placement  . TEE WITHOUT CARDIOVERSION  02/2013  EF 55-60% with normal global function. No thrombus, mass or vegetation. Trivial-small pericardial effusion. Moderate LA dilation. Mild MR.  . TONSILLECTOMY    . TRANSTHORACIC ECHOCARDIOGRAM  07/24/2016   [facility]: EF 45-50%. Mid and apical inferior, inferolateral and apical hypokinesis. GR 2 DD. Mild LA and moderate RA dilation. Mild paracardial effusion. Moderate to severe TR. Basal and mid segment hypokinesis with apical hypokinesis. - Suspect stress-induced cardiopathy versus multivessel CAD.  Marland Kitchen TRANSTHORACIC  ECHOCARDIOGRAM  07/26/2016   [place]: LIMITED (post PPM) - although the report suggested EF 30-35% with apical akinesis, rounding note suggested this was an improvement    Social History  Substance Use Topics  . Smoking status: Former Smoker    Packs/day: 1.50    Years: 17.00    Types: Cigarettes    Start date: 12/18/1963    Quit date: 11/20/1983  . Smokeless tobacco: Never Used  . Alcohol use No    Family History  Problem Relation Age of Onset  . Allergic rhinitis Neg Hx   . Angioedema Neg Hx   . Asthma Neg Hx   . Eczema Neg Hx   . Immunodeficiency Neg Hx   . Urticaria Neg Hx     Allergies  Allergen Reactions  . Levaquin [Levofloxacin In D5w]     Couldn't breath   . Penicillins     Arms swelling   . Rifampin     Couldn't breath     Medication list has been reviewed and updated.  Current Outpatient Prescriptions on File Prior to Visit  Medication Sig Dispense Refill  . Ascorbic Acid (VITAMIN C) 1000 MG tablet Take 1,000 mg by mouth daily.    Marland Kitchen aspirin EC 81 MG tablet Take 81 mg by mouth daily.    Marland Kitchen atorvastatin (LIPITOR) 10 MG tablet Take 5 mg by mouth daily.    . Azelastine HCl 0.15 % SOLN Place 1-2 sprays into the nose 2 (two) times daily as needed. 30 mL 3  . Biotin 10000 MCG TABS Take 1 capsule by mouth daily.    . Calcium Citrate-Vitamin D (CITRACAL + D PO) Take 1 tablet by mouth 2 (two) times daily.    Marland Kitchen co-enzyme Q-10 30 MG capsule Take 60 mg by mouth daily.     Marland Kitchen docusate sodium (COLACE) 100 MG capsule Take 100 mg by mouth daily.    . fluticasone (FLONASE) 50 MCG/ACT nasal spray Place 1 spray into both nostrils daily.     Marland Kitchen guaiFENesin (MUCINEX) 600 MG 12 hr tablet Take 600 mg by mouth at bedtime.    Marland Kitchen Ketotifen Fumarate (REFRESH EYE ITCH RELIEF OP) Apply 1 drop to eye 2 (two) times daily.    Marland Kitchen loratadine (CLARITIN) 10 MG tablet Take 10 mg by mouth daily.    . LUTEIN PO Take 1 tablet by mouth daily.    . Misc Natural Products  (GLUCOS-CHONDROIT-MSM COMPLEX) TABS Take 2 tablets by mouth daily.    . Multiple Vitamins-Minerals (MULTIVITAMIN WITH MINERALS) tablet Take 1 tablet by mouth daily.    . polyethylene glycol (MIRALAX / GLYCOLAX) packet Take 17 g by mouth daily.    . Probiotic CAPS Take 1 capsule by mouth daily.    Marland Kitchen senna (SENOKOT) 8.6 MG tablet Take 1 tablet by mouth daily.     No current facility-administered medications on file prior to visit.     Review of Systems:  As per HPI- otherwise negative.   Physical Examination: Vitals:  12/19/16 1333  BP: 132/72  Pulse: 86  Temp: 97.8 F (36.6 C)   Vitals:   12/19/16 1333  Weight: 92 lb (41.7 kg)  Height: 4\' 8"  (1.422 m)   Body mass index is 20.63 kg/m. Ideal Body Weight: Weight in (lb) to have BMI = 25: 111.3  GEN: WDWN, NAD, Non-toxic, A & O x 3, well appearing elderly woman HEENT: Atraumatic, Normocephalic. Neck supple. No masses, No LAD. Ears and Nose: No external deformity. CV: RRR, No M/G/R. No JVD. No thrill. No extra heart sounds. PULM: CTA B, no wheezes, crackles, rhonchi. No retractions. No resp. distress. No accessory muscle use. ABD: S, NT, her lower belly is full but soft EXTR: No c/c/e NEURO Normal gait.  PSYCH: Normally interactive. Conversant. Not depressed or anxious appearing.  Calm demeanor.  She has a cross over deformity of the left second toe and a left sided bunion. The right second toe is starting to cross over.  She has a few discrete patches of likely spongiotic derm on both feet She is wearing flats that fit snugly over her toes and do not leave any room. She is wearing band-aids over both 2nd toes to pad them    Assessment and Plan: Spongiotic dermatitis - Plan: fluocinonide ointment (LIDEX) 0.05 %  Toe pain, left  Crossover toe deformity of left foot  Abdominal bloating - Plan: US Pelvis Complete  Discussed ways to protect her toes Steroid cream for rash- let me know if not better Referral for pelvic  US- ? Enlarged uterus   Signed Lamar Blinks, MD

## 2016-12-20 ENCOUNTER — Encounter: Payer: Self-pay | Admitting: Cardiovascular Disease

## 2016-12-20 DIAGNOSIS — I442 Atrioventricular block, complete: Secondary | ICD-10-CM | POA: Insufficient documentation

## 2016-12-20 DIAGNOSIS — I5022 Chronic systolic (congestive) heart failure: Secondary | ICD-10-CM | POA: Insufficient documentation

## 2016-12-20 LAB — ALPHA-GAL PANEL
Beef IgE: <0.1 kU/L (ref <0.35)
Class: 0
Class: 0
Class: 0
Galactose-alpha-1,3-galactose IgE*: <0.1 kU/L (ref <0.35)
Lamb/Mutton IgE: <0.1 kU/L (ref <0.35)
Pork IgE: <0.1 kU/L (ref <0.35)

## 2016-12-20 LAB — C1 ESTERASE INHIBITOR, FUNCTIONAL: C1INH Functional/C1INH Total MFr SerPl: >100 % (ref >=68)

## 2016-12-21 ENCOUNTER — Telehealth: Payer: Self-pay | Admitting: Family Medicine

## 2016-12-21 NOTE — Telephone Encounter (Signed)
Pt called in. She says that she had a CatScan completed less than 2 months ago of her chin down to her knees. Pt says that she is scheduled  For pelvic images on 12/26/16. Pt feels that she shouldn't need more images due to previous ones.   Please advise on if pt still need appt?

## 2016-12-22 LAB — COMPLEMENT COMPONENT C1Q: Complement C1Q: <3.6 mg/dL — ABNORMAL LOW (ref 5.0–8.6)

## 2016-12-23 ENCOUNTER — Emergency Department (HOSPITAL_BASED_OUTPATIENT_CLINIC_OR_DEPARTMENT_OTHER)
Admission: EM | Admit: 2016-12-23 | Discharge: 2016-12-24 | Disposition: A | Discharge status: Home / Self Care | Payer: Medicare HMO | Attending: Emergency Medicine | Admitting: Emergency Medicine

## 2016-12-23 DIAGNOSIS — R0989 Other specified symptoms and signs involving the circulatory and respiratory systems: Secondary | ICD-10-CM | POA: Diagnosis not present

## 2016-12-23 DIAGNOSIS — Z7982 Long term (current) use of aspirin: Secondary | ICD-10-CM | POA: Diagnosis not present

## 2016-12-23 DIAGNOSIS — J449 Chronic obstructive pulmonary disease, unspecified: Secondary | ICD-10-CM | POA: Insufficient documentation

## 2016-12-23 DIAGNOSIS — R6889 Other general symptoms and signs: Secondary | ICD-10-CM

## 2016-12-23 DIAGNOSIS — Z87891 Personal history of nicotine dependence: Secondary | ICD-10-CM | POA: Diagnosis not present

## 2016-12-23 DIAGNOSIS — Z950 Presence of cardiac pacemaker: Secondary | ICD-10-CM | POA: Diagnosis not present

## 2016-12-23 DIAGNOSIS — T7840XA Allergy, unspecified, initial encounter: Secondary | ICD-10-CM | POA: Diagnosis present

## 2016-12-23 DIAGNOSIS — R221 Localized swelling, mass and lump, neck: Secondary | ICD-10-CM | POA: Diagnosis not present

## 2016-12-23 NOTE — Telephone Encounter (Signed)
She had a pet scan- will need to explain difference.  Called her but did not reach. Will try again

## 2016-12-24 ENCOUNTER — Emergency Department (HOSPITAL_BASED_OUTPATIENT_CLINIC_OR_DEPARTMENT_OTHER): Payer: Medicare HMO

## 2016-12-24 ENCOUNTER — Telehealth: Payer: Self-pay | Admitting: Family Medicine

## 2016-12-24 ENCOUNTER — Encounter (HOSPITAL_BASED_OUTPATIENT_CLINIC_OR_DEPARTMENT_OTHER): Payer: Self-pay

## 2016-12-24 DIAGNOSIS — R221 Localized swelling, mass and lump, neck: Secondary | ICD-10-CM | POA: Diagnosis not present

## 2016-12-24 DIAGNOSIS — R0989 Other specified symptoms and signs involving the circulatory and respiratory systems: Secondary | ICD-10-CM | POA: Diagnosis not present

## 2016-12-24 DIAGNOSIS — Z87891 Personal history of nicotine dependence: Secondary | ICD-10-CM | POA: Diagnosis not present

## 2016-12-24 DIAGNOSIS — J449 Chronic obstructive pulmonary disease, unspecified: Secondary | ICD-10-CM | POA: Diagnosis not present

## 2016-12-24 DIAGNOSIS — Z950 Presence of cardiac pacemaker: Secondary | ICD-10-CM | POA: Diagnosis not present

## 2016-12-24 DIAGNOSIS — Z7982 Long term (current) use of aspirin: Secondary | ICD-10-CM | POA: Diagnosis not present

## 2016-12-24 MED ORDER — DIPHENHYDRAMINE HCL 50 MG/ML IJ SOLN
25.0000 mg | Freq: Once | INTRAMUSCULAR | Status: AC
  Administered 2016-12-24: 25 mg via INTRAVENOUS
  Filled 2016-12-24: qty 1

## 2016-12-24 MED ORDER — DIPHENHYDRAMINE HCL 25 MG PO TABS: 25.0000 mg | ORAL_TABLET | ORAL | Status: DC | PRN

## 2016-12-24 MED ORDER — DEXAMETHASONE SODIUM PHOSPHATE 10 MG/ML IJ SOLN
10.0000 mg | Freq: Once | INTRAMUSCULAR | Status: DC
  Filled 2016-12-24: qty 1

## 2016-12-24 NOTE — ED Triage Notes (Signed)
Reports having an allergic reaction, complains of shortness of breath and "feeling of throat closing", reports there is a swollen place in her mouth where all this started.

## 2016-12-24 NOTE — Telephone Encounter (Signed)
Called her back- explained that a PET scan does not show the shape and size of her organs- this is why we ordered the [place] as well.  However she does not have to do the [place] if she does not want to.  She wonders what to do about her "bloating," she has had this for a long time and does not think it is anything serious. Does not really want to do any imaging.  She will try experimenting with avoiding foods that make her gassy and see if it helps She feels like using the lidex on her ankle caused her lips to swell.  ER note does not describe any true swelling. However will have her stop this and use a moisturizing agent such as vaseline. She will let me know if she needs anything else

## 2016-12-24 NOTE — ED Notes (Signed)
Pt stated her swelling is now behind her bottom dentures. No swelling noted.

## 2016-12-24 NOTE — Telephone Encounter (Signed)
Relation to pt: self  Call back number:[phone] Pharmacy: [id number] - [place], [place] [phone] (Phone) [phone] (Fax)     Reason for call:  Patient states fluocinonide ointment (LIDEX) 0.05 % causing swollen lips, please advise

## 2016-12-24 NOTE — ED Provider Notes (Signed)
[facility] Provider Note: Tracey Spurling, MD, FACEP  CSN: [id number] MRN: [id number] ARRIVAL: 12/23/16 at 2358 ROOM: MH10/MH10  By signing my name below, I, Judithe Modest, attest that this documentation has been prepared under the direction and in the presence of Orvilla Cornwall, MD. Electronically Signed: Judithe Modest, ER Scribe. 06/30/2016. 12:12 AM.  CHIEF COMPLAINT  Allergic Reaction   HISTORY OF PRESENT ILLNESS  Tracey Holt is a 83 y.o. female complaining of a raw place in her throat (actually, she removes her lower dentures and points to the location of her Wharton's ducts) with a sensation of mild throat swelling. She denies nausea, SOB, vomiting, diarrhea, fever or chills. She states she is constipated. She had a similar problem two months ago and was prescribed prednisone and was treated with prednisone and sent to an allergist. In fact, review of her past records reveals that she had an episode of angioedema of the upper lip. She has been seen recently by Dr. Verlin Fester, allergist, for the angioedema; she has not been on an ACE inhibitor.  Past Medical History:  Diagnosis Date  . Bradycardic cardiac arrest ([place]) 07/24/2016   Required CPR, intubation with multiple rib fractures. Had short term cardiac shock with acute heart failure.  Resulted in possible stress-induced cardiomyopathy  . Cardiac related syncope 07/24/2016   Bradycardic arrest requiring CPR 2 with transvenous pacemaker placed followed by permanent pacemaker; complicated by respiratory arrest, type II MI, stress-induced cardiopathy  . Cardiomyopathy ([place]) 07/2016   Moderate severely reduced EF with apical hypokinesis suggestive of stress-induced cardiac myopathy versus multivessel CAD --> in setting of her to cardiac arrest  . COPD (chronic obstructive pulmonary disease) ([place])    By report  . History of chronic constipation   . Hypercholesterolemia   . Osteoporosis   . Pacemaker 07/26/2016   Abbott  dual-chamber pacemaker: [place] [id number] - Serial [id number]  . Pneumonia 08/2016  . Sarcoidosis of lung ([place])   . Spinal stenosis     Past Surgical History:  Procedure Laterality Date  . ADENOIDECTOMY    . PACEMAKER INSERTION Left 07/26/2016   Abbott Assurity Model [id number] - Serial [id number] -- complicated by pneumothorax requiring chest tube placement  . TEE WITHOUT CARDIOVERSION  02/2013   EF 55-60% with normal global function. No thrombus, mass or vegetation. Trivial-small pericardial effusion. Moderate LA dilation. Mild MR.  . TONSILLECTOMY    . TRANSTHORACIC ECHOCARDIOGRAM  07/24/2016   [facility]: EF 45-50%. Mid and apical inferior, inferolateral and apical hypokinesis. GR 2 DD. Mild LA and moderate RA dilation. Mild paracardial effusion. Moderate to severe TR. Basal and mid segment hypokinesis with apical hypokinesis. - Suspect stress-induced cardiopathy versus multivessel CAD.  Marland Kitchen TRANSTHORACIC ECHOCARDIOGRAM  07/26/2016   [place]: LIMITED (post PPM) - although the report suggested EF 30-35% with apical akinesis, rounding note suggested this was an improvement    Family History  Problem Relation Age of Onset  . Allergic rhinitis Neg Hx   . Angioedema Neg Hx   . Asthma Neg Hx   . Eczema Neg Hx   . Immunodeficiency Neg Hx   . Urticaria Neg Hx     Social History  Substance Use Topics  . Smoking status: Former Smoker    Packs/day: 1.50    Years: 17.00    Types: Cigarettes    Start date: 12/18/1963    Quit date: 11/20/1983  . Smokeless tobacco: Never Used  . Alcohol use  No    Prior to Admission medications   Medication Sig Start Date End Date Taking? Authorizing Provider  Ascorbic Acid (VITAMIN C) 1000 MG tablet Take 1,000 mg by mouth daily.    Historical Provider, MD  aspirin EC 81 MG tablet Take 81 mg by mouth daily.    Historical Provider, MD  atorvastatin (LIPITOR) 10 MG tablet Take 5 mg by mouth daily.     Historical Provider, MD  Azelastine HCl 0.15 % SOLN Place 1-2 sprays into the nose 2 (two) times daily as needed. 12/18/16   Adelina Mings, MD  Biotin 10000 MCG TABS Take 1 capsule by mouth daily.    Historical Provider, MD  Calcium Citrate-Vitamin D (CITRACAL + D PO) Take 1 tablet by mouth 2 (two) times daily.    Historical Provider, MD  co-enzyme Q-10 30 MG capsule Take 60 mg by mouth daily.     Historical Provider, MD  docusate sodium (COLACE) 100 MG capsule Take 100 mg by mouth daily.    Historical Provider, MD  fluocinonide ointment (LIDEX) [id number] % Apply 1 application topically 2 (two) times daily. Use as needed for rash on your foot 12/19/16   Gay Filler Copland, MD  fluticasone (FLONASE) 50 MCG/ACT nasal spray Place 1 spray into both nostrils daily.     Historical Provider, MD  guaiFENesin (MUCINEX) 600 MG 12 hr tablet Take 600 mg by mouth at bedtime.    Historical Provider, MD  Ketotifen Fumarate (REFRESH EYE ITCH RELIEF OP) Apply 1 drop to eye 2 (two) times daily.    Historical Provider, MD  loratadine (CLARITIN) 10 MG tablet Take 10 mg by mouth daily.    Historical Provider, MD  LUTEIN PO Take 1 tablet by mouth daily.    Historical Provider, MD  Misc Natural Products (GLUCOS-CHONDROIT-MSM COMPLEX) TABS Take 2 tablets by mouth daily.    Historical Provider, MD  Multiple Vitamins-Minerals (MULTIVITAMIN WITH MINERALS) tablet Take 1 tablet by mouth daily.    Historical Provider, MD  polyethylene glycol (MIRALAX / GLYCOLAX) packet Take 17 g by mouth daily.    Historical Provider, MD  Probiotic CAPS Take 1 capsule by mouth daily.    Historical Provider, MD  senna (SENOKOT) 8.6 MG tablet Take 1 tablet by mouth daily.    Historical Provider, MD    Allergies Levaquin [levofloxacin in d5w]; Penicillins; and Rifampin   REVIEW OF SYSTEMS  Negative except as noted here or in the History of Present Illness.   PHYSICAL EXAMINATION  Initial Vital Signs Blood pressure (!) 150/103, pulse 83,  temperature 97.5 F (36.4 C), temperature source Oral, resp. rate 20, height 4\' 8"  (1.422 m), weight 92 lb (41.7 kg), SpO2 100 %.  Examination General: Well-developed, well-nourished female in no acute distress; appearance consistent with age of record HENT: normocephalic; atraumatic; no pharyngeal edema; no edema of tongue; no edema of lips; no stridor; no dysphonia; uvula midline; no swelling appreciated around the wharton's ducts bilaterally; hard of hearing Eyes: pupils equal, round and reactive to light; extraocular muscles intact Neck: supple Heart: regular rate and rhythm Lungs: clear to auscultation bilaterally Abdomen: soft; nondistended; nontender; no masses or hepatosplenomegaly; bowel sounds present Extremities: No deformity; full range of motion; pulses normal Neurologic: Awake, alert; motor function intact in all extremities and symmetric; no facial droop Skin: Warm and dry Psychiatric: Normal mood and affect   RESULTS  Summary of this visit's results, reviewed by myself:   EKG Interpretation  Date/Time:    Ventricular Rate:  PR Interval:    QRS Duration:   QT Interval:    QTC Calculation:   R Axis:     Text Interpretation:        Laboratory Studies: No results found for this or any previous visit (from the past 24 hour(s)). Imaging Studies: Dg Neck Soft Tissue  Result Date: 12/24/2016 CLINICAL DATA:  Sensation of swelling in the throat. Possible allergic reaction several hours ago. EXAM: NECK SOFT TISSUES - 1+ VIEW COMPARISON:  None. FINDINGS: There is no evidence of retropharyngeal soft tissue swelling or epiglottic enlargement. The cervical airway is unremarkable and no radio-opaque foreign body identified. Prominent calcification in the thyroid cartilage. Prominent degenerative changes in the cervical spine. Aortic atherosclerosis. Cardiac pacemaker. IMPRESSION: No acute process demonstrated in the soft tissues of the neck. Prominent degenerative changes in  the cervical spine. Electronically Signed   By: Lucienne Capers M.D.   On: 12/24/2016 00:51    ED COURSE  Nursing notes and initial vitals signs, including pulse oximetry, reviewed.  Vitals:   12/24/16 0004  BP: (!) 150/103  Pulse: 83  Resp: 20  Temp: 97.5 F (36.4 C)  TempSrc: Oral  SpO2: 100%  Weight: 92 lb (41.7 kg)  Height: 4\' 8"  (1.422 m)   1:09 AM The patient no longer complains of swelling in the back of her throat or Wharton's ducts. She now states her lower lip is swollen. There is no significant edema of her lower lip on exam. She was given Benadryl 25 milligrams IV we will give a dose of dexamethasone. She was advised to take Benadryl 25 milligrams every 4 hours as needed for swelling. She has an appointment with her allergist, Dr. Verlin Fester, later this week.  PROCEDURES    ED DIAGNOSES     ICD-9-CM ICD-10-CM   1. Sensation of swollen throat 784.99 R68.89      I personally performed the services described in this documentation, which was scribed in my presence. The recorded information has been reviewed and is accurate.         Shanon Rosser, MD 12/24/16 0111

## 2016-12-24 NOTE — ED Notes (Signed)
Dr. Molpus into room, at BS. 

## 2016-12-24 NOTE — Telephone Encounter (Signed)
Called her back- number rings busy

## 2016-12-24 NOTE — ED Notes (Signed)
Pt refused to take decadron IV that is ordered. Pt stated  She is afraid she may be allergic to that also.

## 2016-12-24 NOTE — ED Notes (Signed)
Pt discharged to home with family. NAD. No swelling noted. Pt walked without incident.

## 2016-12-24 NOTE — ED Notes (Addendum)
ED Provider at bedside. Pt  States now her swelling is her bottom lip. No swelling noted.

## 2016-12-24 NOTE — ED Notes (Signed)
Pt to xray

## 2016-12-24 NOTE — ED Notes (Signed)
Back from xray

## 2016-12-25 ENCOUNTER — Telehealth: Payer: Self-pay

## 2016-12-25 DIAGNOSIS — T783XXD Angioneurotic edema, subsequent encounter: Secondary | ICD-10-CM

## 2016-12-25 NOTE — Telephone Encounter (Signed)
Clld pt - LMOVMTC re lab results. 

## 2016-12-25 NOTE — Telephone Encounter (Signed)
-----   Message from Adelina Mings, MD sent at 12/24/2016  5:48 PM EST ----- No laboratory results definitively point to an underlying etiology.  This C1q level was somewhat depressed, however C1 esterase inhibitor and C4 levels were normal. There has been only one case report of acquired angioedema and which there was depressed C1q level and normal C1 esterase level. She apparently had an ER visit yesterday for another episode of angioedema. No labs were drawn. Unfortunately, we will need to recheck C1Q, C4, and C1esterase inhibitor.  In addition, please provide a lab order for baseline serum tryptase and an additional lab order for symptomatic tryptase to be drawn within 4 hours of symptom onset should symptoms recur. Thanks.

## 2016-12-26 ENCOUNTER — Telehealth: Payer: Self-pay | Admitting: Allergy and Immunology

## 2016-12-26 ENCOUNTER — Telehealth: Payer: Self-pay | Admitting: Family Medicine

## 2016-12-26 ENCOUNTER — Ambulatory Visit (HOSPITAL_BASED_OUTPATIENT_CLINIC_OR_DEPARTMENT_OTHER): Payer: Medicare HMO

## 2016-12-26 NOTE — Telephone Encounter (Signed)
Please refer to lab results for updates. Pt has been contacted already.

## 2016-12-26 NOTE — Telephone Encounter (Signed)
Patient would like to be called back about her test results.

## 2016-12-26 NOTE — Telephone Encounter (Signed)
Tried to contact pt to give provider recommendation. No answer, left detailed voicemail with this information.

## 2016-12-26 NOTE — Telephone Encounter (Signed)
Please call her back- it is ok to take both

## 2016-12-26 NOTE — Telephone Encounter (Signed)
Patient is calling to find out whether she can take benadryl and Claritin together? She states the ED told her to take benadryl and she is currently on Claritin. She is very stressed about the possible drug interaction. Please advise   [id number]

## 2016-12-26 NOTE — Telephone Encounter (Signed)
Patient returned your call.

## 2016-12-26 NOTE — Telephone Encounter (Signed)
Patient returned call regarding message below

## 2016-12-26 NOTE — Telephone Encounter (Signed)
Left message for patient that per Dr. Lorelei Pont ok to take both medications.

## 2016-12-27 DIAGNOSIS — T783XXD Angioneurotic edema, subsequent encounter: Secondary | ICD-10-CM | POA: Diagnosis not present

## 2016-12-27 NOTE — Addendum Note (Signed)
Addended by: Tommas Olp B on: 12/27/2016 11:02 AM   Modules accepted: Orders

## 2016-12-28 ENCOUNTER — Telehealth: Payer: Self-pay

## 2016-12-28 NOTE — Telephone Encounter (Signed)
Pt clld in to advs to add Fluocinonide ( Lidex) 0.05% to her allergy list. Pt states she was Rx Lidex by her PCP for her ankles and believes that is what caused her lip swelling on 12/23/16 resulting in ER visit. Pt stated she would this ointment added to her Allergy List as well. Advsd I would.

## 2016-12-31 LAB — CUP PACEART INCLINIC DEVICE CHECK
Date Time Interrogation Session: 20180212094319
Implantable Lead Implant Date: 20170907
Implantable Lead Location: 753859
Implantable Lead Location: 753860
Implantable Pulse Generator Implant Date: 20170907
MDC IDC LEAD IMPLANT DT: 20170907
MDC IDC STAT BRADY RA PERCENT PACED: 29 %
MDC IDC STAT BRADY RV PERCENT PACED: 99 % — AB
Pulse Gen Model: 2272
Pulse Gen Serial Number: 7944486

## 2016-12-31 LAB — C4 COMPLEMENT: C4 Complement: 16 mg/dL

## 2017-01-01 LAB — TRYPTASE: Tryptase: 3.8 ug/L (ref <11)

## 2017-01-02 LAB — C1 ESTERASE INHIBITOR, FUNCTIONAL: C1INH Functional/C1INH Total MFr SerPl: >100 % (ref >=68)

## 2017-01-04 LAB — COMPLEMENT COMPONENT C1Q: Complement C1Q: <3.6 mg/dL — ABNORMAL LOW (ref 5.0–8.6)

## 2017-01-07 ENCOUNTER — Ambulatory Visit (HOSPITAL_COMMUNITY): Payer: Medicare HMO | Attending: Cardiology

## 2017-01-07 ENCOUNTER — Other Ambulatory Visit: Payer: Self-pay

## 2017-01-07 DIAGNOSIS — I083 Combined rheumatic disorders of mitral, aortic and tricuspid valves: Secondary | ICD-10-CM | POA: Insufficient documentation

## 2017-01-07 DIAGNOSIS — Z87891 Personal history of nicotine dependence: Secondary | ICD-10-CM | POA: Diagnosis not present

## 2017-01-07 DIAGNOSIS — J449 Chronic obstructive pulmonary disease, unspecified: Secondary | ICD-10-CM | POA: Insufficient documentation

## 2017-01-07 DIAGNOSIS — E785 Hyperlipidemia, unspecified: Secondary | ICD-10-CM | POA: Insufficient documentation

## 2017-01-07 DIAGNOSIS — I429 Cardiomyopathy, unspecified: Secondary | ICD-10-CM | POA: Insufficient documentation

## 2017-01-07 DIAGNOSIS — I5022 Chronic systolic (congestive) heart failure: Secondary | ICD-10-CM | POA: Diagnosis not present

## 2017-01-07 DIAGNOSIS — D869 Sarcoidosis, unspecified: Secondary | ICD-10-CM | POA: Diagnosis not present

## 2017-01-09 ENCOUNTER — Telehealth: Payer: Self-pay | Admitting: *Deleted

## 2017-01-09 NOTE — Telephone Encounter (Signed)
Patient had repeat blood test done on 2/8. Please advise of results.

## 2017-01-09 NOTE — Telephone Encounter (Signed)
I discussed information regarding the lab results in the lab "quick note" area.

## 2017-01-10 NOTE — Telephone Encounter (Signed)
See result notes. 

## 2017-01-17 ENCOUNTER — Telehealth: Payer: Self-pay | Admitting: Cardiovascular Disease

## 2017-01-17 NOTE — Telephone Encounter (Signed)
New Message     Pt is returning call about her Echo Results

## 2017-01-17 NOTE — Telephone Encounter (Signed)
Returned call to patient. Results reviewed. Patient verbalized understanding.  

## 2017-02-04 ENCOUNTER — Ambulatory Visit (INDEPENDENT_AMBULATORY_CARE_PROVIDER_SITE_OTHER): Payer: Medicare HMO | Admitting: Internal Medicine

## 2017-02-04 ENCOUNTER — Ambulatory Visit (INDEPENDENT_AMBULATORY_CARE_PROVIDER_SITE_OTHER)
Admission: RE | Admit: 2017-02-04 | Discharge: 2017-02-04 | Disposition: A | Discharge status: Home / Self Care | Payer: Medicare HMO | Source: Ambulatory Visit | Attending: Internal Medicine | Admitting: Internal Medicine

## 2017-02-04 ENCOUNTER — Encounter: Payer: Self-pay | Admitting: Internal Medicine

## 2017-02-04 VITALS — BP 114/70 | HR 90 | Ht 59.0 in | Wt 92.0 lb

## 2017-02-04 DIAGNOSIS — R938 Abnormal findings on diagnostic imaging of other specified body structures: Secondary | ICD-10-CM | POA: Diagnosis not present

## 2017-02-04 DIAGNOSIS — R05 Cough: Secondary | ICD-10-CM

## 2017-02-04 DIAGNOSIS — R058 Other specified cough: Secondary | ICD-10-CM

## 2017-02-04 DIAGNOSIS — R9389 Abnormal findings on diagnostic imaging of other specified body structures: Secondary | ICD-10-CM

## 2017-02-04 MED ORDER — FAMOTIDINE 20 MG PO TABS: ORAL_TABLET | ORAL | Status: DC

## 2017-02-04 MED ORDER — PANTOPRAZOLE SODIUM 40 MG PO TBEC: 40.0000 mg | DELAYED_RELEASE_TABLET | Freq: Every day | ORAL | 2 refills | Status: DC

## 2017-02-04 MED ORDER — FAMOTIDINE 20 MG PO TABS: 20.0000 mg | ORAL_TABLET | Freq: Two times a day (BID) | ORAL | 0 refills | Status: DC

## 2017-02-04 NOTE — Patient Instructions (Addendum)
Try pepcid 20 mg twice daily - take after meals   GERD (REFLUX)  is an extremely common cause of respiratory symptoms just like yours , many times with no obvious heartburn at all.    It can be treated with medication, but also with lifestyle changes including elevation of the head of your bed (ideally with 6 inch  bed blocks),  Smoking cessation, avoidance of late meals, excessive alcohol, and avoid fatty foods, chocolate, peppermint, colas, red wine, and acidic juices such as orange juice.  NO MINT OR MENTHOL PRODUCTS SO NO COUGH DROPS   USE SUGARLESS CANDY INSTEAD (Jolley ranchers or Stover's or Life Savers) or even ice chips will also do - the key is to swallow to prevent all throat clearing. NO OIL BASED VITAMINS - use powdered substitutes. - NO MORE FISH OIL   I emphasized that nasal steroids (like flonase)  have no immediate benefit in terms of improving symptoms.  To help them reached the target tissue, the patient should use Afrin two puffs every 12 hours applied one min before using the nasal steroids.  Afrin should be stopped after no more than 5 days.  If the symptoms worsen, Afrin can be restarted after 5 days off of therapy to prevent rebound congestion from overuse of Afrin.  I also emphasized that in no way are nasal steroids a concern in terms of "addiction".     Please remember to go to the  x-ray department downstairs for your tests - we will call you with the results when they are available.      See Tammy NP w/in 4  weeks with all your medications, even over the counter meds, separated in two separate bags, the ones you take no matter what vs the ones you stop once you feel better and take only as needed when you feel you need them.   Tammy  will generate for you a new user friendly medication calendar that will put [place] all on the same page re: your medication use.     Without this process, it simply isn't possible to assure that we are providing  your outpatient care  with   the attention to detail we feel you deserve.   If we cannot assure that you're getting that kind of care,  then we cannot manage your problem effectively from this clinic.  Once you have seen Tammy and we are sure that we're all on the same page with your medication use she will arrange follow up with me.

## 2017-02-04 NOTE — Progress Notes (Signed)
Subjective:     Patient ID: Tracey Holt, female   DOB: [birth year],  MRN: [id number]     Brief patient profile:  89 yowf quit smoking 1977 healthy then and bad pna while living in [place] in early 2000's ? Dx of sarcoid by pulmonary doctor in [place] referred to pulmonary clinic 10/29/2016 by Tracey Holt for abn ct chest s/p CPR in Sept 5 2017 resulting in sev rib fx on L    History of Present Illness  10/29/2016 1st [facility] office visit/ Tracey Holt   Chief Complaint  Patient presents with  . Pulmonary Consult    Referred by Tracey Holt for eval of abnormal chest ct. Pt c/o occ cough with yellow to clear sputum.    recent hx starts with acute cough x one week then to ER 09/11/16 cough was progressively worse over a period of several days to weeks and / felt bad/tired sob > rx zmax [facility] who rec 3 more days of zmax and pt gradually improved back to baseline with CT ordered for L sided cp that has subsequently resolved (? mscp from coughing from cap) and ct suggested mass on 10/05/16 but PET suggesting lipoid pna lingula Pt back to baseline now, minimal am mucus, never bloody or really purulent, no fever or flare with food or h/o aspiration witnessed rec  schedule sinus CT > neg/ allergy profile neg      11/09/2016  f/u ov/Tracey Holt re: uacs? Lipoid pna/ no longer on GERD rx but "lots of supplements"  Chief Complaint  Patient presents with  . Acute Visit    Pt c/o increased SOB and cough since the last visit. Her cough is prod with yellow sputum.    sob ever since cough worse/ wakes up when time to get up not prematurely  / already tried zmax  Sob and cough Worse off ppi  rec Pantoprazole (protonix) 40 mg   Take  30-60 min before first meal of the day and Pepcid (famotidine)  20 mg one @  bedtime until return to office - this is the best way to tell whether stomach acid is contributing to your problem.  GERD diet   Please remember to go to  the lab and x-ray department downstairs for your tests - we will call you with the results when they are available. Stop all supplements for the next month  Change appt to 4 weeks from now but you need to bring all your medications with you including any over the counter      12/12/2016  f/u ov/Tracey Holt re: did not take meds / confused with instructions/ not on gerd rx/ mucinex working fine  Chief Complaint  Patient presents with  . Follow-up    Cough has improved some with taking mucinex. No new co's today.   Not limited by breathing from desired activities  But very sedentary rec Ok to continue to take the mucinex as needed as per the bottle  If coughing or breathing problems recur in the future please call for appointment  to see me or my Nurse practioner and bring all medications with you - we cannot attempt to treat you over the phone or without a full and  accurate inventory of everything you are taking     02/04/2017  f/u ov/Tracey Holt re: lipoid pna/ did not bring meds/ resumed  fish oil and not on gerd rx can't take ppi  Chief Complaint  Patient presents with  .  Acute Visit    Pt c/o increased cough and "swelling in my nasal passages" for the past month.   cough is not worse in am but sporacic during the day assoc with worse nasal congestion x one month, has flonase but "doesn't open me up"  No obvious day to day or daytime variability or assoc excess/ purulent sputum or mucus plugs or hemoptysis or cp or chest tightness, subjective wheeze or overt  hb symptoms. No unusual exp hx or h/o childhood pna/ asthma or knowledge of premature birth.  Sleeping ok without nocturnal  or early am exacerbation  of respiratory  c/o's or need for noct saba. Also denies any obvious fluctuation of symptoms with weather or environmental changes or other aggravating or alleviating factors except as outlined above   Current Medications, Allergies, Complete Past Medical History, Past Surgical History, Family  History, and Social History were reviewed in [employer] record.  ROS  The following are not active complaints unless bolded sore throat, dysphagia, dental problems, itching, sneezing,  nasal congestion or excess/ purulent secretions, ear ache,   fever, chills, sweats, unintended wt loss, classically pleuritic or exertional cp,  orthopnea pnd or leg swelling, presyncope, palpitations, abdominal pain, anorexia, nausea, vomiting, diarrhea  or change in bowel or bladder habits, change in stools or urine, dysuria,hematuria,  rash, arthralgias, visual complaints, headache, numbness, weakness or ataxia or problems with walking or coordination,  change in mood/affect or memory.                 Objective:   Physical Exam    amb pleasant wf nad     02/04/2017       94  12/12/2016       93   11/09/2016    91   10/29/16 93 lb 6.4 oz (42.4 kg)  10/15/16 93 lb (42.2 kg)  10/03/16 92 lb 9.6 oz (42 kg)    Vital signs reviewed  - Note on arrival 02 sats  94% on RA     HEENT: nl dentition, turbinates, and oropharynx. Nl external ear canals without cough reflex   NECK :  without JVD/Nodes/TM/ nl carotid upstrokes bilaterally   LUNGS: no acc muscle use,Severely kyphotic with insp pops and squeaks and exp rhonchi bilaterally   CV:  RRR  no s3 or murmur or increase in P2, nad no edema   ABD:  soft and nontender with nl inspiratory excursion in the supine position. No bruits or organomegaly appreciated, bowel sounds nl  MS:  Nl gait/ ext warm without deformities, calf tenderness, cyanosis or clubbing No obvious joint restrictions   SKIN: warm and dry without lesions    NEURO:  alert, approp, nl sensorium with  no motor or cerebellar deficits apparent.         CXR PA and Lateral:   02/04/2017 :    I personally reviewed images and agree with radiology impression as follows:     Severe kyphosis/ no acute change in markings         Assessment:

## 2017-02-05 ENCOUNTER — Telehealth: Payer: Self-pay | Admitting: Internal Medicine

## 2017-02-05 NOTE — Assessment & Plan Note (Signed)
RML scarring since 2010, neg on PET  10/12/16 - new lingular density following cpr 07/24/2016 ? Lipoid pna per PET 10/12/16  - cxr 11/09/2016 no change > 02/04/2017 no change   Clearly should not be using oil based vitamins/ fish oil in this setting > advised

## 2017-02-05 NOTE — Assessment & Plan Note (Signed)
Sinus CT 11/06/2016 >>> No evidence of sinusitis. Allergy profile 10/29/2016 >  Eos 0.1 /  IgE  13 neg RAST - Flonase/afrin x 5 days 02/04/2017 >>>   Upper airway cough syndrome (previously labeled PNDS) , is  so named because it's frequently impossible to sort out how much is  CR/sinusitis with freq throat clearing (which can be related to primary GERD)   vs  causing  secondary (" extra esophageal")  GERD from wide swings in gastric pressure that occur with throat clearing, often  promoting self use of mint and menthol lozenges that reduce the lower esophageal sphincter tone and exacerbate the problem further in a cyclical fashion.   These are the same pts (now being labeled as having "irritable larynx syndrome" by some cough centers) who not infrequently have a history of having failed to tolerate ace inhibitors,  dry powder inhalers or biphosphonates or report having atypical/extraesophageal reflux symptoms that don't respond to standard doses of PPI  and are easily confused as having aecopd or asthma flares by even experienced allergists/ pulmonologists (myself included).   She has poorly controlled non-specific rhinitis and needs to try afrin/flonase per avs and resume some form of acid suppression : if can't take ppi should be on pepcid or zantac bid pc  My bigger concern here is lack of transparency with how she takes her meds so again instructed to return with all meds in hand using a trust but verify approach to confirm accurate Medication  Reconciliation The principal here is that until we are certain that the  patients are doing what we've asked, it makes no sense to ask them to do more.    To keep things simple, I have asked the patient to first separate medicines that are perceived as maintenance, that is to be taken daily "no matter what", from those medicines that are taken on only on an as-needed basis and I have given the patient examples of both, and then return to see our NP to generate  a  detailed  medication calendar which should be followed until the next physician sees the patient and updates it.   I had an extended discussion with the patient reviewing all relevant studies completed to date and  lasting 15 to 20 minutes of a 25 minute visit    Each maintenance medication was reviewed in detail including most importantly the difference between maintenance and prns and under what circumstances the prns are to be triggered using an action plan format that is not reflected in the computer generated alphabetically organized AVS.    Please see AVS for specific instructions unique to this visit that I personally wrote and verbalized to the the pt in detail and then reviewed with pt  by my nurse highlighting any  changes in therapy recommended at today's visit to their plan of care.

## 2017-02-05 NOTE — Telephone Encounter (Signed)
I was as clear as possible with her at ov > need to move up her f/u with Tammy NP for next available with all meds in hand and she can find her another provider if pt convinced my recs aren't appropriate (if she doesn't follow them in the meatime though there is zero per cent chance they will work )

## 2017-02-05 NOTE — Telephone Encounter (Signed)
Spoke with the pt and notified of her cxr results  She states "what am I going to do about this cough"  I advised she needs to follow all of the instructions given at her visit yesterday:  Try pepcid 20 mg twice daily - take after meals   GERD (REFLUX)  is an extremely common cause of respiratory symptoms just like yours , many times with no obvious heartburn at all.    It can be treated with medication, but also with lifestyle changes including elevation of the head of your bed (ideally with 6 inch  bed blocks),  Smoking cessation, avoidance of late meals, excessive alcohol, and avoid fatty foods, chocolate, peppermint, colas, red wine, and acidic juices such as orange juice.  NO MINT OR MENTHOL PRODUCTS SO NO COUGH DROPS   USE SUGARLESS CANDY INSTEAD (Jolley ranchers or Stover's or Life Savers) or even ice chips will also do - the key is to swallow to prevent all throat clearing. NO OIL BASED VITAMINS - use powdered substitutes. - NO MORE FISH OIL   I emphasized that nasal steroids (like flonase)  have no immediate benefit in terms of improving symptoms.  To help them reached the target tissue, the patient should use Afrin two puffs every 12 hours applied one min before using the nasal steroids.  Afrin should be stopped after no more than 5 days.  If the symptoms worsen, Afrin can be restarted after 5 days off of therapy to prevent rebound congestion from overuse of Afrin.  I also emphasized that in no way are nasal steroids a concern in terms of "addiction".     Please remember to go to the  x-ray department downstairs for your tests - we will call you with the results when they are available.      See Tammy NP w/in 4  weeks with all your medications, even over the counter meds, separated in two separate bags, the ones you take no matter what vs the ones you stop once you feel better and take only as needed when you feel you need them.   Tammy  will generate for you a new user friendly  medication calendar that will put [place] all on the same page re: your medication use  She states none of the recs have anything to do with her cough I advised that all of the recs given if followed correctly should help the cough and that was the goal when Dr Melvyn Novas typed them out for her.  I advised that she should at least try following directions and then call for ov if not improving  She started crying and wants me to ask Dr. Melvyn Novas what to do about her cough  Please advise thanks

## 2017-02-05 NOTE — Progress Notes (Signed)
lmtcb

## 2017-02-05 NOTE — Progress Notes (Signed)
Spoke with pt and notified of results per Dr. Wert. Pt verbalized understanding and denied any questions. 

## 2017-02-05 NOTE — Telephone Encounter (Signed)
Pt aware of below message and voiced her understanding. I offered pt sooner apt with TP on 4/10 pt states she will keep her scheduled apt for 4/16. Nothing further needed.

## 2017-02-06 ENCOUNTER — Telehealth: Payer: Self-pay | Admitting: Internal Medicine

## 2017-02-06 DIAGNOSIS — J300 Vasomotor rhinitis: Secondary | ICD-10-CM

## 2017-02-06 NOTE — Telephone Encounter (Signed)
Spoke with the pt  She states that the afrin and flonase are not helping her nasal swelling at all  She reports taking just like MW advised her to  She is requesting further recs  Please advise

## 2017-02-06 NOTE — Telephone Encounter (Signed)
Called and spoke with pt and she is aware of MW recs.  She is aware that we will call and set up the appt for ENT.

## 2017-02-06 NOTE — Telephone Encounter (Signed)
Could try change mucinex to mucinex d and refer  To ENT

## 2017-02-11 ENCOUNTER — Telehealth: Payer: Self-pay | Admitting: Internal Medicine

## 2017-02-11 NOTE — Telephone Encounter (Signed)
Spoke with pt, states she was instructed to take Mucinex D by MW at her last office visit, but it is keeping her up at night.  Pt states she tolerates the daytime dose well but is requesting different recs so she can sleep.    MW please advise.  Thanks.   02/04/17 AVS- Patient Instructions   Try pepcid 20 mg twice daily - take after meals    GERD (REFLUX)  is an extremely common cause of respiratory symptoms just like yours , many times with no obvious heartburn at all.     It can be treated with medication, but also with lifestyle changes including elevation of the head of your bed (ideally with 6 inch  bed blocks),  Smoking cessation, avoidance of late meals, excessive alcohol, and avoid fatty foods, chocolate, peppermint, colas, red wine, and acidic juices such as orange juice.  NO MINT OR MENTHOL PRODUCTS SO NO COUGH DROPS   USE SUGARLESS CANDY INSTEAD (Jolley ranchers or Stover's or Life Savers) or even ice chips will also do - the key is to swallow to prevent all throat clearing. NO OIL BASED VITAMINS - use powdered substitutes. - NO MORE FISH OIL     I emphasized that nasal steroids (like flonase)  have no immediate benefit in terms of improving symptoms.  To help them reached the target tissue, the patient should use Afrin two puffs every 12 hours applied one min before using the nasal steroids.  Afrin should be stopped after no more than 5 days.  If the symptoms worsen, Afrin can be restarted after 5 days off of therapy to prevent rebound congestion from overuse of Afrin.  I also emphasized that in no way are nasal steroids a concern in terms of "addiction".      Please remember to go to the  x-ray department downstairs for your tests - we will call you with the results when they are available.      See Tammy NP w/in 4  weeks with all your medications, even over the counter meds, separated in two separate bags, the ones you take no matter what vs the ones you stop once you feel better  and take only as needed when you feel you need them.   Tammy  will generate for you a new user friendly medication calendar that will put [place] all on the same page re: your medication use.       Without this process, it simply isn't possible to assure that we are providing  your outpatient care  with  the attention to detail we feel you deserve.   If we cannot assure that you're getting that kind of care,  then we cannot manage your problem effectively from this clinic.   Once you have seen Tammy and we are sure that we're all on the same page with your medication use she will arrange follow up with me.

## 2017-02-11 NOTE — Telephone Encounter (Signed)
Just use mucinex (no d) for the evening dose Make sure she has ent appt set up

## 2017-02-11 NOTE — Telephone Encounter (Signed)
Spoke with pt, aware of recs.  Will close encounter.

## 2017-03-04 ENCOUNTER — Ambulatory Visit: Payer: Medicare HMO | Admitting: Adult Health

## 2017-03-04 ENCOUNTER — Encounter: Payer: Medicare HMO | Admitting: Adult Health

## 2017-03-04 DIAGNOSIS — J310 Chronic rhinitis: Secondary | ICD-10-CM | POA: Diagnosis not present

## 2017-03-05 ENCOUNTER — Encounter: Payer: Self-pay | Admitting: Adult Health

## 2017-03-05 ENCOUNTER — Ambulatory Visit (INDEPENDENT_AMBULATORY_CARE_PROVIDER_SITE_OTHER): Payer: Medicare HMO | Admitting: Adult Health

## 2017-03-05 DIAGNOSIS — R058 Other specified cough: Secondary | ICD-10-CM

## 2017-03-05 DIAGNOSIS — R938 Abnormal findings on diagnostic imaging of other specified body structures: Secondary | ICD-10-CM | POA: Diagnosis not present

## 2017-03-05 DIAGNOSIS — R9389 Abnormal findings on diagnostic imaging of other specified body structures: Secondary | ICD-10-CM

## 2017-03-05 DIAGNOSIS — R05 Cough: Secondary | ICD-10-CM | POA: Diagnosis not present

## 2017-03-05 NOTE — Patient Instructions (Signed)
Avoid fish oil capsules.  Do not take castrol oil .  Follow med calendar closely and bring to each visit .  follow up Dr. Melvyn Novas  In 3 months and As needed

## 2017-03-05 NOTE — Progress Notes (Signed)
@Patient  ID: Tracey Holt, female    DOB: [birth year], 83 y.o.   MRN: [id number]  No chief complaint on file.   Referring provider: Darreld Mclean, MD  HPI: 83 yo female former smoker (1975 ) seen for pulmonary consult 10/2016 for abnormal CT chest 07/2016 .  Had cardiac arrest w/ heart block 07/2016 w/ CPR -rib fx.   TEST /Events  Sinus CT 11/06/2016 >>> No evidence of sinusitis. Allergy profile 10/29/2016 >  Eos 0.1 /  IgE  13 neg RAST - Flonase/afrin x 5 days 02/04/2017 >>>  ENT eval 03/04/17 > d/c flonase . Saline rinses , norm nasal cavities. CT chest 09/2016 3.2 x 2.5cm lingular consolidation.  PET scan suggested lipoid PNA . , hypermetabolic lingular process  CT sinus 10/2016 neg .  Echo 12/2016 EF 50%, Gr 1 DD , nml PAP .  03/05/2017 Follow up : Med review /Abnormal CT chest ? Lipoid PNA  Pt returns for 1 month follow up . She was seen for abnormal CT chest w/ subsequent PET scan that showed a hypermetabolic lingular process . Did appear this could have been a possible lipoid PNA . She was recommended to stop oil based meds.  She does have some nasal congestion , seen by ENT yesterday with office notes indicating nml exam. CT Sinus neg in 10/2016.   We reviewed all her medications organize them into a medication count with patient education. Patient has stopped her Rosebud Poles. She does tell me that she does on occasion take castor oil.  Was advised her to stop immediately..   Allergies  Allergen Reactions  . Lidex [Fluocinonide] Swelling    Caused lip swelling  . Levaquin [Levofloxacin In D5w]     Couldn't breath   . Penicillins     Arms swelling   . Rifampin     Couldn't breath     Immunization History  Administered Date(s) Administered  . Influenza Split 09/03/2007  . Influenza Whole 09/19/2010, 10/26/2011, 09/01/2013, 09/02/2013, 09/08/2014  . Influenza, High Dose Seasonal PF 10/03/2016  . Influenza,inj,quad, With Preservative 10/24/2015  . Pneumococcal  Conjugate-13 03/19/2014  . Pneumococcal-Unspecified 05/12/2009  . Td 04/19/2007, 04/29/2012    Past Medical History:  Diagnosis Date  . Bradycardic cardiac arrest ([place]) 07/24/2016   Required CPR, intubation with multiple rib fractures. Had short term cardiac shock with acute heart failure.  Resulted in possible stress-induced cardiomyopathy  . Cardiac related syncope 07/24/2016   Bradycardic arrest requiring CPR 2 with transvenous pacemaker placed followed by permanent pacemaker; complicated by respiratory arrest, type II MI, stress-induced cardiopathy  . Cardiomyopathy ([place]) 07/2016   Moderate severely reduced EF with apical hypokinesis suggestive of stress-induced cardiac myopathy versus multivessel CAD --> in setting of her to cardiac arrest  . COPD (chronic obstructive pulmonary disease) ([place])    By report  . History of chronic constipation   . Hypercholesterolemia   . Osteoporosis   . Pacemaker 07/26/2016   Abbott dual-chamber pacemaker: [place] #[id number] - Serial [id number]  . Pneumonia 08/2016  . Sarcoidosis of lung ([place])   . Spinal stenosis     Tobacco History: History  Smoking Status  . Former Smoker  . Packs/day: 1.50  . Years: 17.00  . Types: Cigarettes  . Start date: 12/18/1963  . Quit date: 11/20/1983  Smokeless Tobacco  . Never Used   Counseling given: Not Answered   Outpatient Encounter Prescriptions as of 03/05/2017  Medication Sig  . Ascorbic Acid (VITAMIN C) 1000 MG tablet  Take 1,000 mg by mouth daily.  Marland Kitchen aspirin EC 81 MG tablet Take 81 mg by mouth daily.  . Biotin 10000 MCG TABS Take 1 capsule by mouth daily.  . Calcium Citrate-Vitamin D (CITRACAL + D PO) Take 1 tablet by mouth 2 (two) times daily.  Marland Kitchen co-enzyme Q-10 30 MG capsule Take 60 mg by mouth daily.   Marland Kitchen docusate sodium (COLACE) 100 MG capsule Take 100 mg by mouth daily.  . famotidine (PEPCID) 20 MG tablet Take 1 tablet (20 mg total) by mouth 2 (two) times daily.  Marland Kitchen guaiFENesin  (MUCINEX) 600 MG 12 hr tablet Take 600 mg by mouth 2 (two) times daily.   Marland Kitchen loratadine (CLARITIN) 10 MG tablet Take 10 mg by mouth daily.  . LUTEIN PO Take 1 tablet by mouth daily.  . Misc Natural Products (GLUCOS-CHONDROIT-MSM COMPLEX) TABS Take 2 tablets by mouth daily.  . Multiple Vitamins-Minerals (MULTIVITAMIN WITH MINERALS) tablet Take 1 tablet by mouth daily.  . polyethylene glycol (MIRALAX / GLYCOLAX) packet Take 17 g by mouth daily.  . Probiotic CAPS Take 1 capsule by mouth daily.  Marland Kitchen thiamine (VITAMIN B-1) 100 MG tablet Take 100 mg by mouth daily.   No facility-administered encounter medications on file as of 03/05/2017.      Review of Systems  Constitutional:   No  weight loss, night sweats,  Fevers, chills, +fatigue, or  lassitude.  HEENT:   No headaches,  Difficulty swallowing,  Tooth/dental problems, or  Sore throat,                No sneezing, itching, ear ache, nasal congestion, post nasal drip,   CV:  No chest pain,  Orthopnea, PND, swelling in lower extremities, anasarca, dizziness, palpitations, syncope.   GI  No heartburn, indigestion, abdominal pain, nausea, vomiting, diarrhea, change in bowel habits, loss of appetite, bloody stools.   Resp: No shortness of breath with exertion or at rest.   No wheezing.  No chest wall deformity  Skin: no rash or lesions.  GU: no dysuria, change in color of urine, no urgency or frequency.  No flank pain, no hematuria   MS:  No joint pain or swelling.  No decreased range of motion.  No back pain.    Physical Exam  BP 122/72 (BP Location: Right Arm, Cuff Size: Small)   Pulse 74   Ht 4' 9.5" (1.461 m)   Wt 90 lb 3.2 oz (40.9 kg)   SpO2 97%   BMI 19.18 kg/m   GEN: A/Ox3; pleasant , NAD, elderly    HEENT:  [place]/AT,  EACs-clear, TMs-wnl, NOSE-clear, THROAT-clear, no lesions, no postnasal drip or exudate noted.   NECK:  Supple w/ fair ROM; no JVD; normal carotid impulses w/o bruits; no thyromegaly or nodules palpated; no  lymphadenopathy.    RESP  Decreased BS in bases , . no accessory muscle use, no dullness to percussion  CARD:  RRR, no m/r/g, no peripheral edema, pulses intact, no cyanosis or clubbing.  GI:   Soft & nt; nml bowel sounds; no organomegaly or masses detected.   Musco: Warm bil, no deformities or joint swelling noted.   Neuro: alert, no focal deficits noted.    Skin: Warm, no lesions or rashes    Lab Results:  CBC  BNP No results found for: BNP  ProBNP    Component Value Date/Time   PROBNP 62.0 10/03/2016 1433    Imaging: Dg Chest 2 View  Result Date: 02/05/2017 CLINICAL DATA:  Cough.  Pacemaker. EXAM: CHEST  2 VIEW COMPARISON:  No prior . FINDINGS: Cardiac pacer with lead tips in right atrium right ventricle. Cardiomegaly. Mild pulmonary venous congestion . Mild bibasilar infiltrates cannot be excluded. Tiny bilateral pleural effusions cannot be excluded. Follow-up chest x-ray suggested. No pneumothorax. IMPRESSION: 1. Cardiac pacer with lead tips in right atrium. Cardiomegaly. Mild pulmonary venous congestion. 2. Mild bibasilar infiltrates and tiny bilateral pleural effusions cannot be excluded . Follow-up exam suggested to demonstrate clearing . Electronically Signed   By: Marcello Moores  Register   On: 02/05/2017 07:18     Assessment & Plan:   No problem-specific Assessment & Plan notes found for this encounter.     Rexene Edison, NP 03/05/2017

## 2017-03-08 ENCOUNTER — Other Ambulatory Visit: Payer: Self-pay | Admitting: Family Medicine

## 2017-03-08 MED ORDER — ATORVASTATIN CALCIUM 10 MG PO TABS: ORAL_TABLET | ORAL | 3 refills | Status: DC

## 2017-03-08 NOTE — Telephone Encounter (Signed)
Caller name: Relationship to patient: Self Can be reached: [phone]  Pharmacy:  [id number] - [place], [place] [phone] (Phone) [phone] (Fax)     Reason for call: Refill Lipitor 5 mg. States provider has never filled this rx for her but [facility] has a record of it from her previous doctor.

## 2017-03-11 NOTE — Assessment & Plan Note (Signed)
Abnormal CT chest ? Lipoid PNA  Will have her stop all oil based meds follow up in 3 months with chest xray - then if still present consider repeat CT chest .

## 2017-03-11 NOTE — Assessment & Plan Note (Addendum)
Continue trigger control  Avoid oil bases meds  Patient's medications were reviewed today and patient education was given. Computerized medication calendar was adjusted/completed    Plan  Patient Instructions  Avoid fish oil capsules.  Do not take castrol oil .  Follow med calendar closely and bring to each visit .  follow up Dr. Melvyn Novas  In 3 months and As needed

## 2017-03-11 NOTE — Progress Notes (Signed)
Chart and office note reviewed in detail  > agree with a/p as outlined    

## 2017-03-20 ENCOUNTER — Ambulatory Visit (INDEPENDENT_AMBULATORY_CARE_PROVIDER_SITE_OTHER): Payer: Medicare HMO | Admitting: *Deleted

## 2017-03-20 DIAGNOSIS — I442 Atrioventricular block, complete: Secondary | ICD-10-CM | POA: Diagnosis not present

## 2017-03-20 LAB — CUP PACEART REMOTE DEVICE CHECK
Battery Voltage: 2.99 V
Brady Statistic AP VP Percent: 33 %
Brady Statistic RA Percent Paced: 32 %
Date Time Interrogation Session: 20180502060013
Implantable Lead Implant Date: 20170907
Implantable Lead Location: 753860
Implantable Pulse Generator Implant Date: 20170907
Lead Channel Impedance Value: 480 Ohm
Lead Channel Pacing Threshold Amplitude: 0.625 V
Lead Channel Pacing Threshold Pulse Width: 0.4 ms
Lead Channel Setting Sensing Sensitivity: 2 mV
MDC IDC LEAD IMPLANT DT: 20170907
MDC IDC LEAD LOCATION: 753859
MDC IDC MSMT BATTERY REMAINING LONGEVITY: 92 mo
MDC IDC MSMT BATTERY REMAINING PERCENTAGE: 95.5 %
MDC IDC MSMT LEADCHNL RA IMPEDANCE VALUE: 360 Ohm
MDC IDC MSMT LEADCHNL RA SENSING INTR AMPL: 4.2 mV
MDC IDC MSMT LEADCHNL RV PACING THRESHOLD AMPLITUDE: 0.5 V
MDC IDC MSMT LEADCHNL RV PACING THRESHOLD PULSEWIDTH: 0.4 ms
MDC IDC MSMT LEADCHNL RV SENSING INTR AMPL: 8.8 mV
MDC IDC PG SERIAL: 7944486
MDC IDC SET LEADCHNL RA PACING AMPLITUDE: 2.5 V
MDC IDC SET LEADCHNL RV PACING AMPLITUDE: 2.5 V
MDC IDC SET LEADCHNL RV PACING PULSEWIDTH: 0.4 ms
MDC IDC STAT BRADY AP VS PERCENT: 1 %
MDC IDC STAT BRADY AS VP PERCENT: 67 %
MDC IDC STAT BRADY AS VS PERCENT: 1 %
MDC IDC STAT BRADY RV PERCENT PACED: 99 %

## 2017-03-20 NOTE — Progress Notes (Signed)
Remote pacemaker transmission.   

## 2017-03-21 ENCOUNTER — Encounter: Payer: Self-pay | Admitting: Cardiology

## 2017-04-04 ENCOUNTER — Encounter: Payer: Self-pay | Admitting: Cardiology

## 2017-04-19 ENCOUNTER — Telehealth: Payer: Self-pay | Admitting: Cardiovascular Disease

## 2017-04-19 NOTE — Telephone Encounter (Signed)
Left message to call back  

## 2017-04-19 NOTE — Telephone Encounter (Signed)
Agree it sounds like a respiratory infection, not heart failure

## 2017-04-19 NOTE — Telephone Encounter (Signed)
Returned call to patient-reports she has had a productive cough x 1 month, coughing up yellow mucous.  Denies fever, chills, has been around sick contacts as she lives in a retirement community.  Reports she has been taking mucinex and this has helped.  Denies SOB, swelling, CP, reports being able to lie flat.  Reports her stomach "bloats" after every meal.  Advised to contact PCP as there is a concern for infection with the productive cough.  Patient concerned her cough is from HF.  Offered to made appt, patient states she will call PCP today and will call back to make appt if needed.    Advised I would route to Dr. Sallyanne Kuster for further advise or recommendations.

## 2017-04-19 NOTE — Telephone Encounter (Signed)
Follow up  Pt voiced returning nurses call due to call dropped.

## 2017-04-19 NOTE — Telephone Encounter (Signed)
New message     Pt is having a problem with a cough thinks its her heart because it doesn't go away.  Would like you to get her a appt sooner than 6/18 , wants to see Dr C not App

## 2017-04-22 ENCOUNTER — Ambulatory Visit: Payer: Medicare HMO | Admitting: Physician Assistant

## 2017-04-24 DIAGNOSIS — L84 Corns and callosities: Secondary | ICD-10-CM | POA: Diagnosis not present

## 2017-04-24 DIAGNOSIS — L603 Nail dystrophy: Secondary | ICD-10-CM | POA: Diagnosis not present

## 2017-04-24 DIAGNOSIS — I739 Peripheral vascular disease, unspecified: Secondary | ICD-10-CM | POA: Diagnosis not present

## 2017-04-24 DIAGNOSIS — M79672 Pain in left foot: Secondary | ICD-10-CM | POA: Diagnosis not present

## 2017-04-24 DIAGNOSIS — M79671 Pain in right foot: Secondary | ICD-10-CM | POA: Diagnosis not present

## 2017-05-02 ENCOUNTER — Telehealth: Payer: Self-pay | Admitting: Internal Medicine

## 2017-05-02 NOTE — Telephone Encounter (Signed)
Spoke with pt who states she had a cough on going for 6 weeks that has not subsided. She states it is hard to produce mucus, she is also c/o nasal congestion, and that the coughing spells make her feel sob. She denies fever,chest tightness. Informed pt that we did not have any appts open this week and offered pt to reach out to her pcp to see if they would be able to fit her in, pt requested an appt first thing next week instead. Was able to schedule pt to see MW on Monday. Pt had no further questions. Nothing further is needed

## 2017-05-06 ENCOUNTER — Ambulatory Visit (INDEPENDENT_AMBULATORY_CARE_PROVIDER_SITE_OTHER)
Admission: RE | Admit: 2017-05-06 | Discharge: 2017-05-06 | Disposition: A | Discharge status: Home / Self Care | Payer: Medicare HMO | Source: Ambulatory Visit | Attending: Internal Medicine | Admitting: Internal Medicine

## 2017-05-06 ENCOUNTER — Encounter: Payer: Self-pay | Admitting: Internal Medicine

## 2017-05-06 ENCOUNTER — Ambulatory Visit (INDEPENDENT_AMBULATORY_CARE_PROVIDER_SITE_OTHER): Payer: Medicare HMO | Admitting: Internal Medicine

## 2017-05-06 VITALS — BP 106/60 | HR 70 | Ht 59.0 in | Wt 91.4 lb

## 2017-05-06 DIAGNOSIS — R058 Other specified cough: Secondary | ICD-10-CM

## 2017-05-06 DIAGNOSIS — R938 Abnormal findings on diagnostic imaging of other specified body structures: Secondary | ICD-10-CM

## 2017-05-06 DIAGNOSIS — R0602 Shortness of breath: Secondary | ICD-10-CM | POA: Diagnosis not present

## 2017-05-06 DIAGNOSIS — R05 Cough: Secondary | ICD-10-CM

## 2017-05-06 DIAGNOSIS — R9389 Abnormal findings on diagnostic imaging of other specified body structures: Secondary | ICD-10-CM

## 2017-05-06 NOTE — Assessment & Plan Note (Signed)
Sinus CT 11/06/2016 >>> No evidence of sinusitis. Allergy profile 10/29/2016 >  Eos 0.1 /  IgE  13 neg RAST - Flonase/afrin x 5 days 02/04/2017 and use pepcid bid >>> not clear she followed advice, rec ENT eval - 03/04/17  Dr Constance Holster >  ? flonase irritation/ no evidence of pathology > rec ns irrigation > no better  - 05/06/2017 referred back to ENT/ pulmonary f/u is prn   I had an extended final summary discussion with the patient reviewing all relevant studies completed to date and  lasting 15 to 20 minutes of a 25 minute visit on the following issues:   1) no evidence of a pulmonary component to this cough   2) Have not excluded GERD here but ENT eval neg is somewhat reassuring as they tend to call it with very low threshold and didn't do this here   3) referred back to ent as her main complaint now is persistent nasal congestion that did not respond to ent recs to stop flonase and just use ns prn  4) pulmonary f/u is prn     Each maintenance medication was reviewed in detail including most importantly the difference between maintenance and as needed and under what circumstances the prns are to be used. This was done in the context of a medication calendar review which provided the patient with a user-friendly unambiguous mechanism for medication administration and reconciliation and provides an action plan for all active problems. It is critical that this be shown to every doctor  for modification during the office visit if necessary so the patient can use it as a working document.

## 2017-05-06 NOTE — Patient Instructions (Addendum)
For cough /congestion  >   mucinex dm up to 1200 mg every 12 hours as needed   Please remember to go to the  x-ray department downstairs in the basement  for your tests - we will call you with the results when they are available.      If you are satisfied with your treatment plan,  let your doctor know and he/she can either refill your medications or you can return here when your prescription runs out.     If in any way you are not 100% satisfied,  please tell us.  If 100% better, tell your friends!  Pulmonary follow up is as needed  - if you need to return here for any reason, please bring all your active medications with you

## 2017-05-06 NOTE — Progress Notes (Signed)
Subjective:     Patient ID: Tracey Holt, female   DOB: 1922-03-30,  MRN: [id number]     Brief patient profile:  89 yowf quit smoking 1977 healthy then and bad pna while living [place] in early 2000's ? Dx of sarcoid by pulmonary doctor in [place] referred to pulmonary clinic 10/29/2016 by Dr Edilia Bo for abn ct chest s/p CPR in Sept 5 2017 resulting in sev rib fx on L    History of Present Illness  10/29/2016 1st [facility] office visit/ Wert   Chief Complaint  Patient presents with  . Pulmonary Consult    Referred by Dr. Silvestre Mesi for eval of abnormal chest ct. Pt c/o occ cough with yellow to clear sputum.    recent hx starts with acute cough x one week then to ER 09/11/16 cough was progressively worse over a period of several days to weeks and / felt bad/tired sob > rx zmax [facility] who rec 3 more days of zmax and pt gradually improved back to baseline with CT ordered for L sided cp that has subsequently resolved (? mscp from coughing from cap) and ct suggested mass on 10/05/16 but PET suggesting lipoid pna lingula Pt back to baseline now, minimal am mucus, never bloody or really purulent, no fever or flare with food or h/o aspiration witnessed rec  schedule sinus CT > neg/ allergy profile neg      11/09/2016  f/u ov/Wert re: uacs? Lipoid pna/ no longer on GERD rx but "lots of supplements"  Chief Complaint  Patient presents with  . Acute Visit    Pt c/o increased SOB and cough since the last visit. Her cough is prod with yellow sputum.    sob ever since cough worse/ wakes up when time to get up not prematurely  / already tried zmax  Sob and cough Worse off ppi  rec Pantoprazole (protonix) 40 mg   Take  30-60 min before first meal of the day and Pepcid (famotidine)  20 mg one @  bedtime until return to office - this is the best way to tell whether stomach acid is contributing to your problem.  GERD diet   Please remember to go to  the lab and x-ray department downstairs for your tests - we will call you with the results when they are available. Stop all supplements for the next month  Change appt to 4 weeks from now but you need to bring all your medications with you including any over the counter      12/12/2016  f/u ov/Wert re: did not take meds / confused with instructions/ not on gerd rx/ mucinex working fine  Chief Complaint  Patient presents with  . Follow-up    Cough has improved some with taking mucinex. No new co's today.   Not limited by breathing from desired activities  But very sedentary rec Ok to continue to take the mucinex as needed as per the bottle       02/04/2017  f/u ov/Wert re: lipoid pna/ did not bring meds/ resumed  fish oil and not on gerd rx can't take ppi  Chief Complaint  Patient presents with  . Acute Visit    Pt c/o increased cough and "swelling in my nasal passages" for the past month.   cough is not worse in am but sporacic during the day assoc with worse nasal congestion x one month, has flonase but "doesn't open me up" rec  Pepcid 20 mg twice daily > did not take it  ent eval > 03/04/17 [facility] ? flonase irritation > rx NS only and f/u prn but no change in symptoms and  has not been back    05/06/2017  f/u ov/Wert re: lipoid pna/ finally off all oils/ no med calendar / no longer on pepcid  Chief Complaint  Patient presents with  . Acute Visit    Pt c/o "not really coughing but I have all this congestion"- ongoing since her last visit here. She states she occ will produce some yellow sputum. She states "I think all of this is allergy related".    saw ent and allergy since last ov both reported nl eval per pt - does not know name of allergist "dropped off by Lucianne Lei"  No obvious day to day or daytime variability or assoc sob or production of  mucus plugs or hemoptysis or cp or chest tightness, subjective wheeze or overt sinus or hb symptoms. No unusual exp hx or h/o childhood pna/  asthma or knowledge of premature birth.  Sleeping ok without nocturnal  or early am exacerbation  of respiratory  c/o's or need for noct saba. Also denies any obvious fluctuation of symptoms with weather or environmental changes or other aggravating or alleviating factors except as outlined above   Current Medications, Allergies, Complete Past Medical History, Past Surgical History, Family History, and Social History were reviewed in [employer] record.  ROS  The following are not active complaints unless bolded sore throat, dysphagia, dental problems, itching, sneezing,  nasal congestion or excess/ purulent secretions, ear ache,   fever, chills, sweats, unintended wt loss, classically pleuritic or exertional cp,  orthopnea pnd or leg swelling, presyncope, palpitations, abdominal pain, anorexia, nausea, vomiting, diarrhea  or change in bowel or bladder habits, change in stools or urine, dysuria,hematuria,  rash, arthralgias, visual complaints, headache, numbness, weakness or ataxia or problems with walking or coordination,  change in mood/affect or memory.                Objective:   Physical Exam    amb pleasant wf nad    05/06/2017       91     02/04/2017       94  12/12/2016       93   11/09/2016    91   10/29/16 93 lb 6.4 oz (42.4 kg)  10/15/16 93 lb (42.2 kg)  10/03/16 92 lb 9.6 oz (42 kg)    Vital signs reviewed  - Note on arrival 02 sats  93% on RA        HEENT: nl dentition, turbinates, and oropharynx. Nl external ear canals without cough reflex   NECK :  without JVD/Nodes/TM/ nl carotid upstrokes bilaterally   LUNGS: no acc muscle use,Severely kyphotic with very min nsp pops and squeaks and exp rhonchi bilaterally   CV:  RRR  no s3 or murmur or increase in P2, nad no edema   ABD:  soft and nontender with nl inspiratory excursion in the supine position. No bruits or organomegaly appreciated, bowel sounds nl  MS:  Nl gait/ ext warm without  deformities, calf tenderness, cyanosis or clubbing No obvious joint restrictions   SKIN: warm and dry without lesions    NEURO:  alert, approp, nl sensorium with  no motor or cerebellar deficits apparent.      CXR PA and Lateral:   05/06/2017 :    I personally reviewed  images and agree with radiology impression as follows:    1. Stable chest x-ray without evidence of acute cardiopulmonary process. 2. Stable hyperinflation and chronic bronchitic changes with interstitial prominence consistent with underlying COPD. 3.  Aortic Atherosclerosis        Assessment:

## 2017-05-06 NOTE — Progress Notes (Signed)
LMTCB

## 2017-05-06 NOTE — Assessment & Plan Note (Signed)
RML scarring since 2010, neg on PET  10/12/16 - new lingular density following cpr 07/24/2016 ? Lipoid pna per PET 10/12/16  - cxr 11/09/2016 no change > 02/04/2017 no change  - 05/06/2017  Clear > f/u is prn   Discussed in detail all the  indications, usual  risks and alternatives  relative to the benefits with patient who agrees to proceed with conservative f/u as outlined  > f/u prn increase symptoms but must return with all meds in hand using a trust but verify approach to confirm accurate Medication  Reconciliation The principal here is that until we are certain that the  patients are doing what we've asked, it makes no sense to ask them to do more.

## 2017-05-07 NOTE — Progress Notes (Signed)
Spoke with pt and notified of results per Dr. Wert. Pt verbalized understanding and denied any questions. 

## 2017-05-15 DIAGNOSIS — L810 Postinflammatory hyperpigmentation: Secondary | ICD-10-CM | POA: Diagnosis not present

## 2017-05-15 DIAGNOSIS — L905 Scar conditions and fibrosis of skin: Secondary | ICD-10-CM | POA: Diagnosis not present

## 2017-05-15 DIAGNOSIS — L570 Actinic keratosis: Secondary | ICD-10-CM | POA: Diagnosis not present

## 2017-05-15 DIAGNOSIS — L821 Other seborrheic keratosis: Secondary | ICD-10-CM | POA: Diagnosis not present

## 2017-05-23 DIAGNOSIS — L570 Actinic keratosis: Secondary | ICD-10-CM | POA: Diagnosis not present

## 2017-05-27 DIAGNOSIS — H01022 Squamous blepharitis right lower eyelid: Secondary | ICD-10-CM | POA: Diagnosis not present

## 2017-05-27 DIAGNOSIS — H01024 Squamous blepharitis left upper eyelid: Secondary | ICD-10-CM | POA: Diagnosis not present

## 2017-05-27 DIAGNOSIS — H01025 Squamous blepharitis left lower eyelid: Secondary | ICD-10-CM | POA: Diagnosis not present

## 2017-05-27 DIAGNOSIS — Z961 Presence of intraocular lens: Secondary | ICD-10-CM | POA: Diagnosis not present

## 2017-05-27 DIAGNOSIS — H01021 Squamous blepharitis right upper eyelid: Secondary | ICD-10-CM | POA: Diagnosis not present

## 2017-05-27 DIAGNOSIS — H04123 Dry eye syndrome of bilateral lacrimal glands: Secondary | ICD-10-CM | POA: Diagnosis not present

## 2017-06-05 ENCOUNTER — Encounter: Payer: Self-pay | Admitting: Internal Medicine

## 2017-06-05 ENCOUNTER — Ambulatory Visit (INDEPENDENT_AMBULATORY_CARE_PROVIDER_SITE_OTHER): Payer: Medicare HMO | Admitting: Internal Medicine

## 2017-06-05 VITALS — BP 116/60 | HR 69 | Ht 59.0 in | Wt 92.2 lb

## 2017-06-05 DIAGNOSIS — R058 Other specified cough: Secondary | ICD-10-CM

## 2017-06-05 DIAGNOSIS — R05 Cough: Secondary | ICD-10-CM | POA: Diagnosis not present

## 2017-06-05 MED ORDER — AZITHROMYCIN 250 MG PO TABS: ORAL_TABLET | ORAL | 0 refills | Status: DC

## 2017-06-05 NOTE — Assessment & Plan Note (Signed)
Sinus CT 11/06/2016 >>> No evidence of sinusitis. Allergy profile 10/29/2016 >  Eos 0.1 /  IgE  13 neg RAST - Flonase/afrin x 5 days 02/04/2017 and use pepcid bid >>> not clear she followed advice, rec ENT eval - 03/04/17  Dr Constance Holster >  ? flonase irritation/ no evidence of pathology > rec ns irrigation > no better  - 05/06/2017 referred back to ENT/ pulmonary f/u is prn    Mild flare in setting of uri but likely viral as already feeling better - if does not continue to improve rx with zpak appropriate  But no further regular pulmonary f/u needed > Follow up per Primary Care planned

## 2017-06-05 NOTE — Progress Notes (Signed)
Subjective:     Patient ID: Tracey Holt, female   DOB: 1922/07/20,  MRN: [id number]     Brief patient profile:  89 yowf quit smoking 1977 healthy then and bad pna while living [place] in early 2000's ? Dx of sarcoid by pulmonary doctor in [place] referred to pulmonary clinic 10/29/2016 by Dr Edilia Bo for abn ct chest s/p CPR in Sept 5 2017 resulting in sev rib fx on L    History of Present Illness  10/29/2016 1st [facility] office visit/ Diamone Whistler   Chief Complaint  Patient presents with  . Pulmonary Consult    Referred by Dr. Silvestre Mesi for eval of abnormal chest ct. Pt c/o occ cough with yellow to clear sputum.    recent hx starts with acute cough x one week then to ER 09/11/16 cough was progressively worse over a period of several days to weeks and / felt bad/tired sob > rx zmax [facility]ask[facility] who rec 3 more days of zmax and pt gradually improved back to baseline with CT ordered for L sided cp that has subsequently resolved (? mscp from coughing from cap) and ct suggested mass on 10/05/16 but PET suggesting lipoid pna lingula Pt back to baseline now, minimal am mucus, never bloody or really purulent, no fever or flare with food or h/o aspiration witnessed rec  schedule sinus CT > neg/ allergy profile neg      11/09/2016  f/u ov/Milagro Belmares re: uacs? Lipoid pna/ no longer on GERD rx but "lots of supplements"  Chief Complaint  Patient presents with  . Acute Visit    Pt c/o increased SOB and cough since the last visit. Her cough is prod with yellow sputum.    sob ever since cough worse/ wakes up when time to get up not prematurely  / already tried zmax  Sob and cough Worse off ppi  rec Pantoprazole (protonix) 40 mg   Take  30-60 min before first meal of the day and Pepcid (famotidine)  20 mg one @  bedtime until return to office - this is the best way to tell whether stomach acid is contributing to your problem.  GERD diet   Please remember to go to  the lab and x-ray department downstairs for your tests - we will call you with the results when they are available. Stop all supplements for the next month  Change appt to 4 weeks from now but you need to bring all your medications with you including any over the counter      12/12/2016  f/u ov/Zelma Snead re: did not take meds / confused with instructions/ not on gerd rx/ mucinex working fine  Chief Complaint  Patient presents with  . Follow-up    Cough has improved some with taking mucinex. No new co's today.   Not limited by breathing from desired activities  But very sedentary rec Ok to continue to take the mucinex as needed as per the bottle       02/04/2017  f/u ov/Jaben Benegas re: lipoid pna/ did not bring meds/ resumed  fish oil and not on gerd rx can't take ppi  Chief Complaint  Patient presents with  . Acute Visit    Pt c/o increased cough and "swelling in my nasal passages" for the past month.   cough is not worse in am but sporacic during the day assoc with worse nasal congestion x one month, has flonase but "doesn't open me up" rec  Pepcid 20 mg twice daily > did not take it  ent eval > 03/04/17 [facility] ? flonase irritation > rx NS only and f/u prn but no change in symptoms and  has not been back    05/06/2017  f/u ov/Alieah Brinton re: lipoid pna/ finally off all oils/ no med calendar / no longer on pepcid  Chief Complaint  Patient presents with  . Acute Visit    Pt c/o "not really coughing but I have all this congestion"- ongoing since her last visit here. She states she occ will produce some yellow sputum. She states "I think all of this is allergy related".    saw ent and allergy since last ov both reported nl eval per pt - does not know name of allergist "dropped off by Lucianne Lei" rec For cough /congestion  >   mucinex dm up to 1200 mg every 12 hours as needed  Please remember to go to the  x-ray department downstairs in the basement  for your tests - we will call you with the results when they  are available.    06/05/2017  f/u ov/Keeyon Privitera re:   Chief Complaint  Patient presents with  . Follow-up    Pt states that on 06/03/17 she developed a sore throat, ear pain, and more congestion. Occ. SOB on a real hot day if she walks fast. Denies any CP.  better this am than at onset with no change in sputum color/ volume or sob  No obvious day to day or daytime variability or assoc  mucus plugs or hemoptysis or cp or chest tightness, subjective wheeze or overt sinus or hb symptoms. No unusual exp hx or h/o childhood pna/ asthma or knowledge of premature birth.  Sleeping ok without nocturnal  or early am exacerbation  of respiratory  c/o's or need for noct saba. Also denies any obvious fluctuation of symptoms with weather or environmental changes or other aggravating or alleviating factors except as outlined above   Current Medications, Allergies, Complete Past Medical History, Past Surgical History, Family History, and Social History were reviewed in [employer] record.  ROS  The following are not active complaints unless bolded sore throat, dysphagia, dental problems, itching, sneezing,  nasal congestion or excess/ purulent secretions, ear ache,   fever, chills, sweats, unintended wt loss, classically pleuritic or exertional cp,  orthopnea pnd or leg swelling, presyncope, palpitations, abdominal pain, anorexia, nausea, vomiting, diarrhea  or change in bowel or bladder habits, change in stools or urine, dysuria,hematuria,  rash, arthralgias, visual complaints, headache, numbness, weakness or ataxia or problems with walking or coordination,  change in mood/affect or memory.             Objective:   Physical Exam    amb pleasant wf nad    06/05/2017       92  05/06/2017       91     02/04/2017       94  12/12/2016       93   11/09/2016    91   10/29/16 93 lb 6.4 oz (42.4 kg)  10/15/16 93 lb (42.2 kg)  10/03/16 92 lb 9.6 oz (42 kg)    Vital signs reviewed  - - Note on  arrival 02 sats  94% on RA      HEENT: nl   turbinates, and oropharynx. Nl external ear canals without cough reflex - full dentures   NECK :  without JVD/Nodes/TM/ nl carotid upstrokes bilaterally  LUNGS: no acc muscle use,Severely kyphotic with very min nsp pops and squeaks   bilaterally   CV:  RRR  no s3 or murmur or increase in P2, nad no edema   ABD:  soft and nontender with nl inspiratory excursion in the supine position. No bruits or organomegaly appreciated, bowel sounds nl  MS:  Nl gait/ ext warm without deformities, calf tenderness, cyanosis or clubbing No obvious joint restrictions   SKIN: warm and dry without lesions    NEURO:  alert, approp, nl sensorium with  no motor or cerebellar deficits apparent.      CXR PA and Lateral:   05/06/2017 :    I personally reviewed images and agree with radiology impression as follows:    1. Stable chest x-ray without evidence of acute cardiopulmonary process. 2. Stable hyperinflation and chronic bronchitic changes with interstitial prominence consistent with underlying COPD. 3.  Aortic Atherosclerosis        Assessment:

## 2017-06-05 NOTE — Patient Instructions (Addendum)
If condition gets worse ok to take a zpak  No regular pulmonary follow up is needed  - f/u with Dr Edilia Bo

## 2017-06-19 ENCOUNTER — Ambulatory Visit (INDEPENDENT_AMBULATORY_CARE_PROVIDER_SITE_OTHER): Payer: Medicare HMO | Admitting: *Deleted

## 2017-06-19 DIAGNOSIS — I442 Atrioventricular block, complete: Secondary | ICD-10-CM

## 2017-06-19 NOTE — Progress Notes (Signed)
Remote pacemaker transmission.   

## 2017-06-20 ENCOUNTER — Encounter: Payer: Self-pay | Admitting: Cardiology

## 2017-07-08 DIAGNOSIS — H04123 Dry eye syndrome of bilateral lacrimal glands: Secondary | ICD-10-CM | POA: Diagnosis not present

## 2017-07-08 DIAGNOSIS — H01025 Squamous blepharitis left lower eyelid: Secondary | ICD-10-CM | POA: Diagnosis not present

## 2017-07-08 DIAGNOSIS — H16223 Keratoconjunctivitis sicca, not specified as Sjogren's, bilateral: Secondary | ICD-10-CM | POA: Diagnosis not present

## 2017-07-08 DIAGNOSIS — H01024 Squamous blepharitis left upper eyelid: Secondary | ICD-10-CM | POA: Diagnosis not present

## 2017-07-08 DIAGNOSIS — Z961 Presence of intraocular lens: Secondary | ICD-10-CM | POA: Diagnosis not present

## 2017-07-08 DIAGNOSIS — H01022 Squamous blepharitis right lower eyelid: Secondary | ICD-10-CM | POA: Diagnosis not present

## 2017-07-08 DIAGNOSIS — H01021 Squamous blepharitis right upper eyelid: Secondary | ICD-10-CM | POA: Diagnosis not present

## 2017-07-15 ENCOUNTER — Encounter: Payer: Self-pay | Admitting: Cardiology

## 2017-07-26 LAB — CUP PACEART REMOTE DEVICE CHECK
Battery Remaining Longevity: 89 mo
Brady Statistic AP VP Percent: 37 %
Brady Statistic AP VS Percent: 1 %
Brady Statistic AS VP Percent: 63 %
Implantable Lead Implant Date: 20170907
Implantable Lead Location: 753860
Lead Channel Impedance Value: 350 Ohm
Lead Channel Impedance Value: 450 Ohm
Lead Channel Pacing Threshold Amplitude: 0.5 V
Lead Channel Sensing Intrinsic Amplitude: 8.8 mV
Lead Channel Setting Pacing Amplitude: 2.5 V
Lead Channel Setting Pacing Pulse Width: 0.4 ms
Lead Channel Setting Sensing Sensitivity: 2 mV
MDC IDC LEAD IMPLANT DT: 20170907
MDC IDC LEAD LOCATION: 753859
MDC IDC MSMT BATTERY REMAINING PERCENTAGE: 95.5 %
MDC IDC MSMT BATTERY VOLTAGE: 2.99 V
MDC IDC MSMT LEADCHNL RA PACING THRESHOLD AMPLITUDE: 0.75 V
MDC IDC MSMT LEADCHNL RA PACING THRESHOLD PULSEWIDTH: 0.4 ms
MDC IDC MSMT LEADCHNL RA SENSING INTR AMPL: 3.1 mV
MDC IDC MSMT LEADCHNL RV PACING THRESHOLD PULSEWIDTH: 0.4 ms
MDC IDC PG IMPLANT DT: 20170907
MDC IDC SESS DTM: 20180801105842
MDC IDC SET LEADCHNL RV PACING AMPLITUDE: 2.5 V
MDC IDC STAT BRADY AS VS PERCENT: 1 %
MDC IDC STAT BRADY RA PERCENT PACED: 37 %
MDC IDC STAT BRADY RV PERCENT PACED: 99 %
Pulse Gen Model: 2272
Pulse Gen Serial Number: 7944486

## 2017-07-30 DIAGNOSIS — J3089 Other allergic rhinitis: Secondary | ICD-10-CM | POA: Diagnosis not present

## 2017-07-30 DIAGNOSIS — R140 Abdominal distension (gaseous): Secondary | ICD-10-CM | POA: Diagnosis not present

## 2017-07-30 DIAGNOSIS — R05 Cough: Secondary | ICD-10-CM | POA: Diagnosis not present

## 2017-07-30 DIAGNOSIS — K5901 Slow transit constipation: Secondary | ICD-10-CM | POA: Diagnosis not present

## 2017-07-30 DIAGNOSIS — G3184 Mild cognitive impairment, so stated: Secondary | ICD-10-CM | POA: Diagnosis not present

## 2017-07-31 DIAGNOSIS — R05 Cough: Secondary | ICD-10-CM | POA: Diagnosis not present

## 2017-08-26 DIAGNOSIS — L603 Nail dystrophy: Secondary | ICD-10-CM | POA: Diagnosis not present

## 2017-08-26 DIAGNOSIS — R05 Cough: Secondary | ICD-10-CM | POA: Diagnosis not present

## 2017-08-26 DIAGNOSIS — R0982 Postnasal drip: Secondary | ICD-10-CM | POA: Diagnosis not present

## 2017-08-26 DIAGNOSIS — E785 Hyperlipidemia, unspecified: Secondary | ICD-10-CM | POA: Diagnosis not present

## 2017-08-26 DIAGNOSIS — K5901 Slow transit constipation: Secondary | ICD-10-CM | POA: Diagnosis not present

## 2017-09-02 DIAGNOSIS — J3089 Other allergic rhinitis: Secondary | ICD-10-CM | POA: Diagnosis not present

## 2017-09-02 DIAGNOSIS — Z23 Encounter for immunization: Secondary | ICD-10-CM | POA: Diagnosis not present

## 2017-09-02 DIAGNOSIS — R05 Cough: Secondary | ICD-10-CM | POA: Diagnosis not present

## 2017-09-03 DIAGNOSIS — M79674 Pain in right toe(s): Secondary | ICD-10-CM | POA: Diagnosis not present

## 2017-09-03 DIAGNOSIS — M2042 Other hammer toe(s) (acquired), left foot: Secondary | ICD-10-CM | POA: Diagnosis not present

## 2017-09-03 DIAGNOSIS — M21612 Bunion of left foot: Secondary | ICD-10-CM | POA: Diagnosis not present

## 2017-09-03 DIAGNOSIS — B351 Tinea unguium: Secondary | ICD-10-CM | POA: Diagnosis not present

## 2017-09-03 DIAGNOSIS — M2011 Hallux valgus (acquired), right foot: Secondary | ICD-10-CM | POA: Diagnosis not present

## 2017-09-03 DIAGNOSIS — M2061 Acquired deformities of toe(s), unspecified, right foot: Secondary | ICD-10-CM | POA: Diagnosis not present

## 2017-09-03 DIAGNOSIS — L84 Corns and callosities: Secondary | ICD-10-CM | POA: Diagnosis not present

## 2017-09-03 DIAGNOSIS — I739 Peripheral vascular disease, unspecified: Secondary | ICD-10-CM | POA: Diagnosis not present

## 2017-09-12 DIAGNOSIS — Z79899 Other long term (current) drug therapy: Secondary | ICD-10-CM | POA: Diagnosis not present

## 2017-09-16 ENCOUNTER — Telehealth: Payer: Self-pay | Admitting: Family Medicine

## 2017-09-16 NOTE — Telephone Encounter (Signed)
Caller name:Shanette Bogacz Relationship to patient: Can be BVAPOLI:[phone] Pharmacy:  Reason for call:Patient would like to transfer care from Dr Lorelei Pont to [facility]. Please Advise

## 2017-09-16 NOTE — Telephone Encounter (Signed)
Okay for her to transfer to me but would you remind her that it looks like she is due for a flu vaccine.  Gets vaccines then nurse flu vaccine visit.

## 2017-09-16 NOTE — Telephone Encounter (Signed)
That is fine with me.

## 2017-09-17 NOTE — Telephone Encounter (Signed)
Lm on vm, awaiting return call

## 2017-09-17 NOTE — Telephone Encounter (Signed)
Patient scheduled with PA for 09/19/2017

## 2017-09-18 ENCOUNTER — Ambulatory Visit (INDEPENDENT_AMBULATORY_CARE_PROVIDER_SITE_OTHER): Payer: Medicare HMO | Admitting: *Deleted

## 2017-09-18 DIAGNOSIS — I442 Atrioventricular block, complete: Secondary | ICD-10-CM | POA: Diagnosis not present

## 2017-09-19 ENCOUNTER — Ambulatory Visit (INDEPENDENT_AMBULATORY_CARE_PROVIDER_SITE_OTHER): Payer: Medicare HMO | Admitting: Medical

## 2017-09-19 ENCOUNTER — Encounter: Payer: Self-pay | Admitting: Medical

## 2017-09-19 VITALS — BP 111/74 | HR 95 | Temp 98.3°F | Resp 16 | Wt 93.6 lb

## 2017-09-19 DIAGNOSIS — E785 Hyperlipidemia, unspecified: Secondary | ICD-10-CM | POA: Diagnosis not present

## 2017-09-19 DIAGNOSIS — J301 Allergic rhinitis due to pollen: Secondary | ICD-10-CM

## 2017-09-19 LAB — CBC WITH DIFFERENTIAL/PLATELET
BASOS PCT: 1.2 % (ref 0.0–3.0)
Basophils Absolute: 0.1 10*3/uL (ref 0.0–0.1)
EOS ABS: 0.1 10*3/uL (ref 0.0–0.7)
Eosinophils Relative: 1.5 % (ref 0.0–5.0)
HEMATOCRIT: 41 % (ref 36.0–46.0)
Hemoglobin: 13.2 g/dL (ref 12.0–15.0)
LYMPHS ABS: 1.1 10*3/uL (ref 0.7–4.0)
Lymphocytes Relative: 18.4 % (ref 12.0–46.0)
MCHC: 32.2 g/dL (ref 30.0–36.0)
MCV: 90.7 fl (ref 78.0–100.0)
MONO ABS: 0.8 10*3/uL (ref 0.1–1.0)
Monocytes Relative: 13.2 % — ABNORMAL HIGH (ref 3.0–12.0)
NEUTROS ABS: 3.8 10*3/uL (ref 1.4–7.7)
NEUTROS PCT: 65.7 % (ref 43.0–77.0)
PLATELETS: 188 10*3/uL (ref 150.0–400.0)
RBC: 4.52 Mil/uL (ref 3.87–5.11)
RDW: 14.6 % (ref 11.5–15.5)
WBC: 5.7 10*3/uL (ref 4.0–10.5)

## 2017-09-19 LAB — COMPREHENSIVE METABOLIC PANEL
ALT: 37 U/L — ABNORMAL HIGH (ref 0–35)
AST: 38 U/L — ABNORMAL HIGH (ref 0–37)
Albumin: 4.1 g/dL (ref 3.5–5.2)
Alkaline Phosphatase: 56 U/L (ref 39–117)
BUN: 24 mg/dL — ABNORMAL HIGH (ref 6–23)
CALCIUM: 9.7 mg/dL (ref 8.4–10.5)
CO2: 32 meq/L (ref 19–32)
Chloride: 99 mEq/L (ref 96–112)
Creatinine, Ser: 0.57 mg/dL (ref 0.40–1.20)
GFR: 105.11 mL/min (ref >60.00)
GLUCOSE: 91 mg/dL (ref 70–99)
POTASSIUM: 4.9 meq/L (ref 3.5–5.1)
Sodium: 136 mEq/L (ref 135–145)
Total Bilirubin: 0.6 mg/dL (ref 0.2–1.2)
Total Protein: 6.6 g/dL (ref 6.0–8.3)

## 2017-09-19 LAB — LIPID PANEL
Cholesterol: 145 mg/dL (ref 0–200)
HDL: 69.4 mg/dL (ref >39.00)
LDL Cholesterol: 61 mg/dL (ref 0–99)
NONHDL: 75.88
TRIGLYCERIDES: 72 mg/dL (ref 0.0–149.0)
Total CHOL/HDL Ratio: 2
VLDL: 14.4 mg/dL (ref 0.0–40.0)

## 2017-09-19 NOTE — Progress Notes (Signed)
Subjective:    Patient ID: Tracey Holt, female    DOB: 1922-03-20, 83 y.o.   MRN: [id number]  HPI  Pt in for evaluation. She has year round allergies. She uses xyzal. She also uses flonase.  She has mild year round allergies with occasional rare cough. See ros. But cough is not worse than her baseline cough.   History of  Hyperlipidemia. No recent lipid panel.  Pt states 5 days ago blood drawn but she never got the results back from former provider(Dr. Fredderick Phenix). She wants redraw.  Pt had flu vaccine and pneumonia vaccines.  Review of Systems  Constitutional: Negative for chills, fatigue and fever.  HENT: Positive for congestion and postnasal drip. Negative for ear pain, rhinorrhea, sinus pain, sinus pressure and sneezing.   Respiratory: Positive for cough. Negative for chest tightness.        Year round transient occasional mild cough with rare mucous.  Cardiovascular: Negative for chest pain and palpitations.  Gastrointestinal: Negative for abdominal pain, diarrhea and vomiting.  Musculoskeletal: Negative for back pain.  Skin: Negative for rash.  Neurological: Negative for dizziness.  Hematological: Negative for adenopathy. Does not bruise/bleed easily.  Psychiatric/Behavioral: Negative for behavioral problems and confusion.    Past Medical History:  Diagnosis Date  . Bradycardic cardiac arrest ([place]) 07/24/2016   Required CPR, intubation with multiple rib fractures. Had short term cardiac shock with acute heart failure.  Resulted in possible stress-induced cardiomyopathy  . Cardiac related syncope 07/24/2016   Bradycardic arrest requiring CPR 2 with transvenous pacemaker placed followed by permanent pacemaker; complicated by respiratory arrest, type II MI, stress-induced cardiopathy  . Cardiomyopathy ([place]) 07/2016   Moderate severely reduced EF with apical hypokinesis suggestive of stress-induced cardiac myopathy versus multivessel CAD --> in setting of her to cardiac arrest    . COPD (chronic obstructive pulmonary disease) ([place])    By report  . History of chronic constipation   . Hypercholesterolemia   . Osteoporosis   . Pacemaker 07/26/2016   Abbott dual-chamber pacemaker: [place] #[id number] - Serial [id number]  . Pneumonia 08/2016  . Sarcoidosis of lung ([place])   . Spinal stenosis      Social History   Social History  . Marital status: Widowed    Spouse name: N/A  . Number of children: N/A  . Years of education: N/A   Occupational History  . Not on file.   Social History Main Topics  . Smoking status: Former Smoker    Packs/day: 1.50    Years: 17.00    Types: Cigarettes    Start date: 12/18/1963    Quit date: 11/20/1983  . Smokeless tobacco: Never Used  . Alcohol use No  . Drug use: No  . Sexual activity: Not Currently   Other Topics Concern  . Not on file   Social History Narrative   Recently moved to [place] from [place] to live near her daughter.    Past Surgical History:  Procedure Laterality Date  . ADENOIDECTOMY    . PACEMAKER INSERTION Left 07/26/2016   Abbott Assurity Model #JJ88416 - Serial #[id number] -- complicated by pneumothorax requiring chest tube placement  . TEE WITHOUT CARDIOVERSION  02/2013   EF 55-60% with normal global function. No thrombus, mass or vegetation. Trivial-small pericardial effusion. Moderate LA dilation. Mild MR.  . TONSILLECTOMY    . TRANSTHORACIC ECHOCARDIOGRAM  07/24/2016   [facility]: EF 45-50%. Mid and apical inferior, inferolateral and apical hypokinesis. GR 2 DD. Mild  LA and moderate RA dilation. Mild paracardial effusion. Moderate to severe TR. Basal and mid segment hypokinesis with apical hypokinesis. - Suspect stress-induced cardiopathy versus multivessel CAD.  Marland Kitchen TRANSTHORACIC ECHOCARDIOGRAM  07/26/2016   [facility]: LIMITED (post PPM) - although the report suggested EF 30-35% with apical akinesis, rounding note suggested this  was an improvement    Family History  Problem Relation Age of Onset  . Allergic rhinitis Neg Hx   . Angioedema Neg Hx   . Asthma Neg Hx   . Eczema Neg Hx   . Immunodeficiency Neg Hx   . Urticaria Neg Hx     Allergies  Allergen Reactions  . Lidex [Fluocinonide] Swelling    Caused lip swelling  . Levaquin [Levofloxacin In D5w]     Couldn't breath   . Penicillins     Arms swelling   . Rifampin     Couldn't breath     Current Outpatient Prescriptions on File Prior to Visit  Medication Sig Dispense Refill  . aspirin EC 81 MG tablet Take 81 mg by mouth daily.    Marland Kitchen atorvastatin (LIPITOR) 10 MG tablet Take 1/2 tablet (5mg ) by mouth daily 45 tablet 3  . Biotin 10000 MCG TABS Take 1 capsule by mouth daily.    . Calcium Citrate-Vitamin D (CITRACAL + D PO) Take 1 tablet by mouth 2 (two) times daily.    Marland Kitchen guaiFENesin (MUCINEX) 600 MG 12 hr tablet Take 600 mg by mouth 2 (two) times daily.     Marland Kitchen loratadine (CLARITIN) 10 MG tablet Take 10 mg by mouth daily.    . Ascorbic Acid (VITAMIN C) 1000 MG tablet Take 1,000 mg by mouth daily.    Marland Kitchen docusate sodium (COLACE) 100 MG capsule Take 100 mg by mouth daily.    . LUTEIN PO Take 1 tablet by mouth daily.    . Misc Natural Products (GLUCOS-CHONDROIT-MSM COMPLEX) TABS Take 2 tablets by mouth daily.    . Multiple Vitamins-Minerals (MULTIVITAMIN WITH MINERALS) tablet Take 1 tablet by mouth daily.    . polyethylene glycol (MIRALAX / GLYCOLAX) packet Take 17 g by mouth daily.    . Probiotic CAPS Take 1 capsule by mouth daily.     No current facility-administered medications on file prior to visit.     BP 111/74   Pulse 95   Temp 98.3 F (36.8 C) (Oral)   Resp 16   Wt 93 lb 9.6 oz (42.5 kg)   SpO2 95%   BMI 18.90 kg/m       Objective:   Physical Exam  General  Mental Status - Alert. General Appearance - Well groomed. Not in acute distress.  Skin Rashes- No Rashes.  HEENT Head- Normal. Ear Auditory Canal - Left- Normal. Right -  Normal.Tympanic Membrane- Left- Normal. Right- Normal. Eye Sclera/Conjunctiva- Left- Normal. Right- Normal. Nose & Sinuses Nasal Mucosa- Left-  Boggy and Congested. Right-  Boggy and  Congested.Bilateral  No maxillary and  No frontal sinus pressure. Mouth & Throat Lips: Upper Lip- Normal: no dryness, cracking, pallor, cyanosis, or vesicular eruption. Lower Lip-Normal: no dryness, cracking, pallor, cyanosis or vesicular eruption. Buccal Mucosa- Bilateral- No Aphthous ulcers. Oropharynx- No Discharge or Erythema. +pbd Tonsils: Characteristics- Bilateral- No Erythema or Congestion. Size/Enlargement- Bilateral- No enlargement. Discharge- bilateral-None.  Neck Neck- Supple. No Masses.    Chest and Lung Exam Auscultation: Breath Sounds:-Clear even and unlabored.  Cardiovascular Auscultation:Rythm- Regular, rate and rhythm. Murmurs & Other Heart Sounds:Ausculatation of the heart reveal-  No Murmurs.  Lymphatic Head & Neck General Head & Neck Lymphatics: Bilateral: Description- No Localized lymphadenopathy.  Lower ext- no pedal edema.      Assessment & Plan:  For you history of high cholesterol will get cbc, cmp and lipid panel today.   For your allergies would advise continue Xyzal and Flonase.  You described a steady low level symptoms all year round.  I do want you to watch for any secondary sinus type infectious symptoms or any bronchitis type symptoms.  If you develop such symptoms please notify us and be seen also we could treat you with antibiotic.  At your age I would recommend early treatment rather than waiting.  Presently no indication for antibiotic.  Follow-up date to be determined after lab review.  Delson Dulworth, Percell Miller, PA-C

## 2017-09-19 NOTE — Patient Instructions (Signed)
For you history of high cholesterol will get cbc, cmp and lipid panel today.   For your allergies would advise continue Xyzal and Flonase.  You described a steady low level symptoms all year round.  I do want you to watch for any secondary sinus type infectious symptoms or any bronchitis type symptoms.  If you develop such symptoms please notify us and be seen also we could treat you with antibiotic.  At your age I would recommend early treatment rather than waiting.  Presently no indication for antibiotic.  Follow-up date to be determined after lab review.

## 2017-09-20 ENCOUNTER — Encounter: Payer: Self-pay | Admitting: Cardiology

## 2017-09-20 NOTE — Progress Notes (Signed)
Remote pacemaker transmission.   

## 2017-10-04 ENCOUNTER — Encounter: Payer: Self-pay | Admitting: Medical

## 2017-10-04 ENCOUNTER — Ambulatory Visit (INDEPENDENT_AMBULATORY_CARE_PROVIDER_SITE_OTHER): Payer: Medicare HMO | Admitting: Medical

## 2017-10-04 ENCOUNTER — Telehealth: Payer: Self-pay | Admitting: Medical

## 2017-10-04 ENCOUNTER — Ambulatory Visit (HOSPITAL_BASED_OUTPATIENT_CLINIC_OR_DEPARTMENT_OTHER)
Admission: RE | Admit: 2017-10-04 | Discharge: 2017-10-04 | Disposition: A | Discharge status: Home / Self Care | Payer: Medicare HMO | Source: Ambulatory Visit | Attending: Medical | Admitting: Medical

## 2017-10-04 VITALS — BP 141/76 | HR 69 | Temp 97.7°F | Resp 16 | Ht 59.0 in | Wt 92.8 lb

## 2017-10-04 DIAGNOSIS — R05 Cough: Secondary | ICD-10-CM

## 2017-10-04 DIAGNOSIS — J40 Bronchitis, not specified as acute or chronic: Secondary | ICD-10-CM | POA: Diagnosis not present

## 2017-10-04 DIAGNOSIS — R0602 Shortness of breath: Secondary | ICD-10-CM | POA: Diagnosis not present

## 2017-10-04 DIAGNOSIS — I700 Atherosclerosis of aorta: Secondary | ICD-10-CM | POA: Insufficient documentation

## 2017-10-04 DIAGNOSIS — J449 Chronic obstructive pulmonary disease, unspecified: Secondary | ICD-10-CM | POA: Diagnosis not present

## 2017-10-04 DIAGNOSIS — R059 Cough, unspecified: Secondary | ICD-10-CM

## 2017-10-04 MED ORDER — BENZONATATE 100 MG PO CAPS: 100.0000 mg | ORAL_CAPSULE | Freq: Three times a day (TID) | ORAL | 0 refills | Status: DC | PRN

## 2017-10-04 MED ORDER — AZITHROMYCIN 250 MG PO TABS: ORAL_TABLET | ORAL | 0 refills | Status: DC

## 2017-10-04 NOTE — Patient Instructions (Signed)
You appear to have bronchitis. Rest hydrate and tylenol for fever. I am prescribing cough medicine benzonatate, and azithromycin  antibiotic.   I do want you to get cxr today to evaluate if you have walking pneumonia or just bronchitis.  Follow up in 7-10 days or as needed

## 2017-10-04 NOTE — Telephone Encounter (Signed)
Pt notified of results

## 2017-10-04 NOTE — Telephone Encounter (Signed)
No pnuemonia on cxr. So treating for bronchitis. Notify pt. I think she will be feeling some better gradually.

## 2017-10-04 NOTE — Progress Notes (Signed)
Subjective:    Patient ID: Tracey Holt, female    DOB: [birth year], 83 y.o.   MRN: [id number]  HPI  Pt in with cough for 3 weeks. Cough won't resolve. She states colored/yellowish cough. No fever, no chills or sweats. Some fatigue.  Pt has been trying mucinex in the morning.   Pt in past states had pneumonia one time in 2017. She was not hospitalized but too azithromycin.   No hx of smoking.   Review of Systems  Constitutional: Negative for chills, fatigue and fever.  HENT: Negative for congestion, sinus pressure and sinus pain.   Respiratory: Positive for cough. Negative for chest tightness, shortness of breath and wheezing.        Chest congestion  Cardiovascular: Negative for chest pain and palpitations.  Gastrointestinal: Negative for abdominal pain.  Genitourinary: Negative for dysuria and flank pain.  Musculoskeletal: Negative for back pain and neck pain.  Skin: Negative for rash.  Neurological: Negative for dizziness.  Hematological: Negative for adenopathy. Does not bruise/bleed easily.  Psychiatric/Behavioral: Negative for behavioral problems and confusion.   Past Medical History:  Diagnosis Date  . Bradycardic cardiac arrest ([place]) 07/24/2016   Required CPR, intubation with multiple rib fractures. Had short term cardiac shock with acute heart failure.  Resulted in possible stress-induced cardiomyopathy  . Cardiac related syncope 07/24/2016   Bradycardic arrest requiring CPR 2 with transvenous pacemaker placed followed by permanent pacemaker; complicated by respiratory arrest, type II MI, stress-induced cardiopathy  . Cardiomyopathy ([place]) 07/2016   Moderate severely reduced EF with apical hypokinesis suggestive of stress-induced cardiac myopathy versus multivessel CAD --> in setting of her to cardiac arrest  . COPD (chronic obstructive pulmonary disease) ([place])    By report  . History of chronic constipation   . Hypercholesterolemia   . Osteoporosis   . Pacemaker  07/26/2016   Abbott dual-chamber pacemaker: [place] #[id number] - Serial [id number]  . Pneumonia 08/2016  . Sarcoidosis of lung ([place])   . Spinal stenosis      Social History   Socioeconomic History  . Marital status: Widowed    Spouse name: Not on file  . Number of children: Not on file  . Years of education: Not on file  . Highest education level: Not on file  Social Needs  . Financial resource strain: Not on file  . Food insecurity - worry: Not on file  . Food insecurity - inability: Not on file  . Transportation needs - medical: Not on file  . Transportation needs - non-medical: Not on file  Occupational History  . Not on file  Tobacco Use  . Smoking status: Former Smoker    Packs/day: 1.50    Years: 17.00    Pack years: 25.50    Types: Cigarettes    Start date: 12/18/1963    Last attempt to quit: 11/20/1983    Years since quitting: 33.8  . Smokeless tobacco: Never Used  Substance and Sexual Activity  . Alcohol use: No  . Drug use: No  . Sexual activity: Not Currently  Other Topics Concern  . Not on file  Social History Narrative   Recently moved to [place] from [place] to live near her daughter.    Past Surgical History:  Procedure Laterality Date  . ADENOIDECTOMY    . PACEMAKER INSERTION Left 07/26/2016   Abbott Assurity Model #[id number] - Serial #[id number] -- complicated by pneumothorax requiring chest tube placement  . TEE WITHOUT CARDIOVERSION  02/2013   EF  55-60% with normal global function. No thrombus, mass or vegetation. Trivial-small pericardial effusion. Moderate LA dilation. Mild MR.  . TONSILLECTOMY    . TRANSTHORACIC ECHOCARDIOGRAM  07/24/2016   [facility]: EF 45-50%. Mid and apical inferior, inferolateral and apical hypokinesis. GR 2 DD. Mild LA and moderate RA dilation. Mild paracardial effusion. Moderate to severe TR. Basal and mid segment hypokinesis with apical hypokinesis. - Suspect stress-induced  cardiopathy versus multivessel CAD.  Marland Kitchen TRANSTHORACIC ECHOCARDIOGRAM  07/26/2016   [place]: LIMITED (post PPM) - although the report suggested EF 30-35% with apical akinesis, rounding note suggested this was an improvement    Family History  Problem Relation Age of Onset  . Allergic rhinitis Neg Hx   . Angioedema Neg Hx   . Asthma Neg Hx   . Eczema Neg Hx   . Immunodeficiency Neg Hx   . Urticaria Neg Hx     Allergies  Allergen Reactions  . Lidex [Fluocinonide] Swelling    Caused lip swelling  . Levaquin [Levofloxacin In D5w]     Couldn't breath   . Penicillins     Arms swelling   . Rifampin     Couldn't breath     Current Outpatient Medications on File Prior to Visit  Medication Sig Dispense Refill  . Ascorbic Acid (VITAMIN C) 1000 MG tablet Take 1,000 mg by mouth daily.    Marland Kitchen aspirin EC 81 MG tablet Take 81 mg by mouth daily.    Marland Kitchen atorvastatin (LIPITOR) 10 MG tablet Take 1/2 tablet (5mg ) by mouth daily 45 tablet 3  . Biotin 10000 MCG TABS Take 1 capsule by mouth daily.    . Calcium Citrate-Vitamin D (CITRACAL + D PO) Take 1 tablet by mouth 2 (two) times daily.    Marland Kitchen docusate sodium (COLACE) 100 MG capsule Take 100 mg by mouth daily.    Marland Kitchen guaiFENesin (MUCINEX) 600 MG 12 hr tablet Take 600 mg by mouth 2 (two) times daily.     Marland Kitchen loratadine (CLARITIN) 10 MG tablet Take 10 mg by mouth daily.    . LUTEIN PO Take 1 tablet by mouth daily.    . Misc Natural Products (GLUCOS-CHONDROIT-MSM COMPLEX) TABS Take 2 tablets by mouth daily.    . Multiple Vitamins-Minerals (MULTIVITAMIN WITH MINERALS) tablet Take 1 tablet by mouth daily.    . polyethylene glycol (MIRALAX / GLYCOLAX) packet Take 17 g by mouth daily.    . Probiotic CAPS Take 1 capsule by mouth daily.     No current facility-administered medications on file prior to visit.     BP (!) 141/76   Pulse 69   Temp 97.7 F (36.5 C) (Oral)   Resp 16   Ht 4\' 11"  (1.499 m)   Wt 92 lb 12.8 oz (42.1 kg)    SpO2 94%   BMI 18.74 kg/m       Objective:   Physical Exam  General  Mental Status - Alert. General Appearance - Well groomed. Not in acute distress.  Skin Rashes- No Rashes.  HEENT Head- Normal. Ear Auditory Canal - Left- Normal. Right - Normal.Tympanic Membrane- Left- Normal. Right- Normal. Eye Sclera/Conjunctiva- Left- Normal. Right- Normal. Nose & Sinuses Nasal Mucosa- Left-  Boggy and Congested. Right-  Boggy and  Congested.Bilateral maxillary and frontal sinus pressure. Mouth & Throat Lips: Upper Lip- Normal: no dryness, cracking, pallor, cyanosis, or vesicular eruption. Lower Lip-Normal: no dryness, cracking, pallor, cyanosis or vesicular eruption. Buccal Mucosa- Bilateral- No Aphthous ulcers. Oropharynx-  No Discharge or Erythema. Tonsils: Characteristics- Bilateral- No Erythema or Congestion. Size/Enlargement- Bilateral- No enlargement. Discharge- bilateral-None.  Neck Neck- Supple. No Masses.   Chest and Lung Exam Auscultation: Breath Sounds:-even and unlabored. Upper lobe rough breath sounds bilaterally.  Cardiovascular Auscultation:Rythm- Regular, rate and rhythm. Murmurs & Other Heart Sounds:Ausculatation of the heart reveal- No Murmurs.  Lymphatic Head & Neck General Head & Neck Lymphatics: Bilateral: Description- No Localized lymphadenopathy.  Lower extremity- no pedal edema.       Assessment & Plan:  You appear to have bronchitis. Rest hydrate and tylenol for fever. I am prescribing cough medicine benzonatate, and azithromycin  antibiotic.   I do want you to get cxr today to evaluate if you have walking pneumonia or just bronchitis.  Follow up in 7-10 days or as needed  T[place], Percell Miller, [employer]

## 2017-10-15 LAB — CUP PACEART REMOTE DEVICE CHECK
Battery Remaining Longevity: 96 mo
Battery Voltage: 2.99 V
Brady Statistic AP VP Percent: 41 %
Brady Statistic AS VP Percent: 59 %
Brady Statistic AS VS Percent: 1 %
Brady Statistic RA Percent Paced: 41 %
Date Time Interrogation Session: 20181031153811
Implantable Lead Implant Date: 20170907
Implantable Lead Location: 753860
Lead Channel Impedance Value: 360 Ohm
Lead Channel Impedance Value: 450 Ohm
Lead Channel Pacing Threshold Amplitude: 0.75 V
Lead Channel Pacing Threshold Pulse Width: 0.4 ms
Lead Channel Pacing Threshold Pulse Width: 0.4 ms
Lead Channel Sensing Intrinsic Amplitude: 8.8 mV
Lead Channel Setting Pacing Amplitude: 2.5 V
MDC IDC LEAD IMPLANT DT: 20170907
MDC IDC LEAD LOCATION: 753859
MDC IDC MSMT BATTERY REMAINING PERCENTAGE: 95.5 %
MDC IDC MSMT LEADCHNL RA SENSING INTR AMPL: 3.6 mV
MDC IDC MSMT LEADCHNL RV PACING THRESHOLD AMPLITUDE: 0.5 V
MDC IDC PG IMPLANT DT: 20170907
MDC IDC PG SERIAL: 7944486
MDC IDC SET LEADCHNL RA PACING AMPLITUDE: 2.5 V
MDC IDC SET LEADCHNL RV PACING PULSEWIDTH: 0.4 ms
MDC IDC SET LEADCHNL RV SENSING SENSITIVITY: 2 mV
MDC IDC STAT BRADY AP VS PERCENT: 1 %
MDC IDC STAT BRADY RV PERCENT PACED: 99 %

## 2017-10-22 NOTE — Progress Notes (Signed)
[place] at [facility] [address] [place], [place] [address] [phone]  Date:  10/23/2017   Name:  Tracey Holt   DOB:  Dec 12, 1921   MRN:  [id number]  PCP:  Darreld Mclean, MD    Chief Complaint: Follow-up   History of Present Illness:  Tracey Holt is a 83 y.o. very pleasant female patient who presents with the following:  Elderly lady with history of CHF, complete heart block and history of cardiac arrest, pacemaker Here today with concern of not feeling well She notes a cough "for a long time" She feels that the cough is deep.  She is not sure if this is due to allergies- she does have environmental allergies She was in during November to see Percell Miller for a sick visit:  You appear to have bronchitis. Rest hydrate and tylenol for fever. I am prescribing cough medicine benzonatate, and azithromycin  antibiotic.  I do want you to get cxr today to evaluate if you have walking pneumonia or just bronchitis.  Dg Chest 2 View  Result Date: 10/04/2017 CLINICAL DATA:  Productive cough, shortness of breath, COPD, pacemaker EXAM: CHEST  2 VIEW COMPARISON:  11/26/2016 FINDINGS: Stable background COPD/ emphysema changes with parenchymal scarring and hyperinflation. Left subclavian 2 lead pacer noted. Stable mild cardiomegaly without superimposed CHF, edema or effusions. Negative for pneumothorax. Aorta is atherosclerotic and tortuous. Trachea is midline. Degenerative changes of the spine with associated scoliosis, chronic compression fractures, and increased thoracic kyphosis. IMPRESSION: Stable COPD/ emphysema. No superimposed acute process or significant interval change Thoracic aortic atherosclerosis Electronically Signed   By: Jerilynn Mages.  Shick M.D.   On: 10/04/2017 12:12   She took the azithromycin per Percell Miller, but this did not seem to help The cough can be productive No fever, she does feel chilled sometimes but this is not new - she always  feels cold She has noted the cough for 6 weeks overall now She may have a runny nose, no sneezing however She is using robitussin OTC which does help a bit No vomiting No ST  She has seen Dr. Melvyn Novas a few times over the past year for upper airway cough syndrome- she is not really clear on what medications she has tried for this so far  I am not sure if she ever tried a PPI or not Graysen lives at [facility] and they brought her in to be seen today. However she is not accompanied by any family I did call and speak with her son Louie Casa- I am concerned that Pyper is more confused, and was not really able to remember what medications she has tried.  We got into a kind of circular conversation where I would suggest something we might try, she would not be able to remember if she had already tried that or would have some argument against using it.  Asking the same questions several times. Louie Casa is aware of her worsening dementia and was not aware that she was coming to the doctor today.  He will continue to keep an eye on her.  He notes that she has had this cough for over a year, and he understands that "she will have to live with it"    Pt also wonders why she has no energy at the end of our visit.  I explained that this could be any of a number of things.  We did get a UA which was benign.  Otherwise  asked her to come back and see [place] if this persists   Patient Active Problem List   Diagnosis Date Noted  . CHB (complete heart block) ([place]) 12/20/2016  . Chronic systolic congestive heart failure ([place]) 12/20/2016  . Angioedema 12/17/2016  . Chronic rhinitis 12/17/2016  . Dyspnea 11/09/2016  . Upper airway cough syndrome 10/29/2016  . Abnormal CT of the chest  c/w lipoid pna 10/29/2016  . Toe ulcer, right ([place]) 10/17/2016  . Community acquired pneumonia of left lower lobe of lung ([place]) 09/19/2016  . S/P placement of cardiac pacemaker 09/19/2016  . History of syncope 09/19/2016  . Congestive  dilated cardiomyopathy ([place]) 07/24/2016  . Bradycardic cardiac arrest ([place]) 07/24/2016    Past Medical History:  Diagnosis Date  . Bradycardic cardiac arrest ([place]) 07/24/2016   Required CPR, intubation with multiple rib fractures. Had short term cardiac shock with acute heart failure.  Resulted in possible stress-induced cardiomyopathy  . Cardiac related syncope 07/24/2016   Bradycardic arrest requiring CPR 2 with transvenous pacemaker placed followed by permanent pacemaker; complicated by respiratory arrest, type II MI, stress-induced cardiopathy  . Cardiomyopathy ([place]) 07/2016   Moderate severely reduced EF with apical hypokinesis suggestive of stress-induced cardiac myopathy versus multivessel CAD --> in setting of her to cardiac arrest  . COPD (chronic obstructive pulmonary disease) ([place])    By report  . History of chronic constipation   . Hypercholesterolemia   . Osteoporosis   . Pacemaker 07/26/2016   Abbott dual-chamber pacemaker: [place] #[id number] - Serial [id number]  . Pneumonia 08/2016  . Sarcoidosis of lung ([place])   . Spinal stenosis     Past Surgical History:  Procedure Laterality Date  . ADENOIDECTOMY    . PACEMAKER INSERTION Left 07/26/2016   Abbott Assurity Model #[id number] - Serial #[id number] -- complicated by pneumothorax requiring chest tube placement  . TEE WITHOUT CARDIOVERSION  02/2013   EF 55-60% with normal global function. No thrombus, mass or vegetation. Trivial-small pericardial effusion. Moderate LA dilation. Mild MR.  . TONSILLECTOMY    . TRANSTHORACIC ECHOCARDIOGRAM  07/24/2016   [facility]: EF 45-50%. Mid and apical inferior, inferolateral and apical hypokinesis. GR 2 DD. Mild LA and moderate RA dilation. Mild paracardial effusion. Moderate to severe TR. Basal and mid segment hypokinesis with apical hypokinesis. - Suspect stress-induced cardiopathy versus multivessel CAD.  Marland Kitchen TRANSTHORACIC ECHOCARDIOGRAM  07/26/2016    [place]: LIMITED (post PPM) - although the report suggested EF 30-35% with apical akinesis, rounding note suggested this was an improvement    Social History   Tobacco Use  . Smoking status: Former Smoker    Packs/day: 1.50    Years: 17.00    Pack years: 25.50    Types: Cigarettes    Start date: 12/18/1963    Last attempt to quit: 11/20/1983    Years since quitting: 33.9  . Smokeless tobacco: Never Used  Substance Use Topics  . Alcohol use: No  . Drug use: No    Family History  Problem Relation Age of Onset  . Allergic rhinitis Neg Hx   . Angioedema Neg Hx   . Asthma Neg Hx   . Eczema Neg Hx   . Immunodeficiency Neg Hx   . Urticaria Neg Hx     Allergies  Allergen Reactions  . Lidex [Fluocinonide] Swelling    Caused lip swelling  . Levaquin [Levofloxacin In D5w]     Couldn't breath   . Penicillins  Arms swelling   . Rifampin     Couldn't breath     Medication list has been reviewed and updated.  Current Outpatient Medications on File Prior to Visit  Medication Sig Dispense Refill  . Ascorbic Acid (VITAMIN C) 1000 MG tablet Take 1,000 mg by mouth daily.    Marland Kitchen aspirin EC 81 MG tablet Take 81 mg by mouth daily.    Marland Kitchen atorvastatin (LIPITOR) 10 MG tablet Take 1/2 tablet (5mg ) by mouth daily 45 tablet 3  . Biotin 10000 MCG TABS Take 1 capsule by mouth daily.    . Calcium Citrate-Vitamin D (CITRACAL + D PO) Take 1 tablet by mouth 2 (two) times daily.    Marland Kitchen docusate sodium (COLACE) 100 MG capsule Take 100 mg by mouth daily.    Marland Kitchen loratadine (CLARITIN) 10 MG tablet Take 10 mg by mouth daily.    . LUTEIN PO Take 1 tablet by mouth daily.    . Misc Natural Products (GLUCOS-CHONDROIT-MSM COMPLEX) TABS Take 2 tablets by mouth daily.    . Multiple Vitamins-Minerals (MULTIVITAMIN WITH MINERALS) tablet Take 1 tablet by mouth daily.    . Probiotic CAPS Take 1 capsule by mouth daily.     No current facility-administered medications on file prior to visit.      Review of Systems:  As per HPI- otherwise negative. The cough is not getting worse but is not really getting better either  She is taking her claritin   Physical Examination: Vitals:   10/23/17 1456  BP: 130/82  Pulse: 68  Temp: 97.7 F (36.5 C)  SpO2: 99%   Vitals:   10/23/17 1456  Weight: 94 lb (42.6 kg)  Height: 4\' 10"  (1.473 m)   Body mass index is 19.65 kg/m. Ideal Body Weight: Weight in (lb) to have BMI = 25: 119.4  GEN: WDWN, NAD, Non-toxic, A & O x 3, elderly lady, HOH, thin build HEENT: Atraumatic, Normocephalic. Neck supple. No masses, No LAD.  Oropharynx and nasal cavity who Ears and Nose: No external deformity. CV: RRR, No M/G/R. No JVD. No thrill. No extra heart sounds. PULM: CTA B, no wheezes, crackles, rhonchi. No retractions. No resp. distress. No accessory muscle use. ABD: S, NT, ND EXTR: No c/c/e NEURO Normal gait.  PSYCH: Normally interactive. Conversant. Not depressed or anxious appearing.  Calm demeanor.   Results for orders placed or performed in visit on 10/23/17  POCT urinalysis dipstick  Result Value Ref Range   Color, UA yellow yellow   Clarity, UA cloudy (A) clear   Glucose, UA negative negative mg/dL   Bilirubin, UA negative negative   Ketones, POC UA negative negative mg/dL   Spec Grav, UA 1.010 1.010 - 1.025   Blood, UA negative negative   pH, UA 6.5 5.0 - 8.0   Protein Ur, POC negative negative mg/dL   Urobilinogen, UA 0.2 0.2 or 1.0 E.U./dL   Nitrite, UA Negative Negative   Leukocytes, UA Negative Negative     Assessment and Plan: Persistent cough  Fatigue, unspecified type - Plan: POCT urinalysis dipstick  Here today with a cough for over a year.  Difficult to discuss a plan with her due to dementia. Discussed with her son who is aware of this cough, she has seen pulmonology and there is likely not a cure for this problem.  We did get a UA for her today which does not suggest a UTI Otherwise asked her to come back and  see me to discuss her other  concerns. Of note pt had recently asked to transfer her care to [facility], one of my partners.    Signed Lamar Blinks, MD

## 2017-10-23 ENCOUNTER — Encounter: Payer: Self-pay | Admitting: Family Medicine

## 2017-10-23 ENCOUNTER — Ambulatory Visit (INDEPENDENT_AMBULATORY_CARE_PROVIDER_SITE_OTHER): Payer: Medicare HMO | Admitting: Family Medicine

## 2017-10-23 VITALS — BP 130/82 | HR 68 | Temp 97.7°F | Ht <= 58 in | Wt 94.0 lb

## 2017-10-23 DIAGNOSIS — R5383 Other fatigue: Secondary | ICD-10-CM | POA: Diagnosis not present

## 2017-10-23 DIAGNOSIS — F0390 Unspecified dementia without behavioral disturbance: Secondary | ICD-10-CM

## 2017-10-23 DIAGNOSIS — R05 Cough: Secondary | ICD-10-CM

## 2017-10-23 DIAGNOSIS — R053 Chronic cough: Secondary | ICD-10-CM

## 2017-10-23 DIAGNOSIS — R69 Illness, unspecified: Secondary | ICD-10-CM | POA: Diagnosis not present

## 2017-10-23 LAB — POCT URINALYSIS DIP (MANUAL ENTRY)
Bilirubin, UA: NEGATIVE
GLUCOSE UA: NEGATIVE mg/dL
Ketones, POC UA: NEGATIVE mg/dL
Leukocytes, UA: NEGATIVE
NITRITE UA: NEGATIVE
PH UA: 6.5 (ref 5.0–8.0)
Protein Ur, POC: NEGATIVE mg/dL
RBC UA: NEGATIVE
SPEC GRAV UA: 1.01 (ref 1.010–1.025)
UROBILINOGEN UA: 0.2 U/dL

## 2017-10-23 NOTE — Patient Instructions (Signed)
It was good to see you today It looks like your cough has been present for about a year (from looking back in your chart).  You have seen the lung specialist (Dr. Melvyn Novas) about this. We do not think your cough is anything dangerous, but we are also not able to tell you exactly what is causing the cough. You are ok to continue your over the counter cough syrup as needed

## 2017-11-07 ENCOUNTER — Telehealth: Payer: Self-pay

## 2017-11-07 NOTE — Telephone Encounter (Signed)
Please advise 

## 2017-11-07 NOTE — Telephone Encounter (Signed)
Ok with me 

## 2017-11-07 NOTE — Telephone Encounter (Signed)
Copied from [place]. Topic: Appointment Scheduling - Scheduling Inquiry for Clinic >> Nov 07, 2017  4:28 PM Tracey Holt wrote: Reason for CRM: pt called in and would like to transfer from [place] to [place].  She would like to know if this is ok and if she call her back to sch appt with [place]

## 2017-11-07 NOTE — Telephone Encounter (Signed)
This is fine with me- I think she already asked to make this change earlier this year?

## 2017-11-25 DIAGNOSIS — M79675 Pain in left toe(s): Secondary | ICD-10-CM | POA: Diagnosis not present

## 2017-11-25 DIAGNOSIS — B351 Tinea unguium: Secondary | ICD-10-CM | POA: Diagnosis not present

## 2017-11-25 DIAGNOSIS — M79674 Pain in right toe(s): Secondary | ICD-10-CM | POA: Diagnosis not present

## 2017-12-04 ENCOUNTER — Ambulatory Visit (INDEPENDENT_AMBULATORY_CARE_PROVIDER_SITE_OTHER): Payer: Medicare HMO | Admitting: Medical

## 2017-12-04 ENCOUNTER — Encounter: Payer: Self-pay | Admitting: Medical

## 2017-12-04 VITALS — BP 148/78 | HR 74 | Temp 98.1°F | Resp 16 | Ht <= 58 in | Wt 96.0 lb

## 2017-12-04 DIAGNOSIS — L089 Local infection of the skin and subcutaneous tissue, unspecified: Secondary | ICD-10-CM

## 2017-12-04 DIAGNOSIS — R5383 Other fatigue: Secondary | ICD-10-CM | POA: Diagnosis not present

## 2017-12-04 DIAGNOSIS — Z8781 Personal history of (healed) traumatic fracture: Secondary | ICD-10-CM | POA: Diagnosis not present

## 2017-12-04 DIAGNOSIS — B351 Tinea unguium: Secondary | ICD-10-CM

## 2017-12-04 LAB — CBC WITH DIFFERENTIAL/PLATELET
Basophils Absolute: 0 10*3/uL (ref 0.0–0.1)
Basophils Relative: 0.9 % (ref 0.0–3.0)
EOS ABS: 0.1 10*3/uL (ref 0.0–0.7)
Eosinophils Relative: 2 % (ref 0.0–5.0)
HEMATOCRIT: 39.8 % (ref 36.0–46.0)
HEMOGLOBIN: 13.1 g/dL (ref 12.0–15.0)
LYMPHS PCT: 23.1 % (ref 12.0–46.0)
Lymphs Abs: 1.2 10*3/uL (ref 0.7–4.0)
MCHC: 32.9 g/dL (ref 30.0–36.0)
MCV: 88.8 fl (ref 78.0–100.0)
Monocytes Absolute: 0.6 10*3/uL (ref 0.1–1.0)
Monocytes Relative: 10.8 % (ref 3.0–12.0)
Neutro Abs: 3.4 10*3/uL (ref 1.4–7.7)
Neutrophils Relative %: 63.2 % (ref 43.0–77.0)
PLATELETS: 201 10*3/uL (ref 150.0–400.0)
RBC: 4.48 Mil/uL (ref 3.87–5.11)
RDW: 13.9 % (ref 11.5–15.5)
WBC: 5.4 10*3/uL (ref 4.0–10.5)

## 2017-12-04 LAB — COMPREHENSIVE METABOLIC PANEL WITH GFR
ALT: 28 U/L (ref 0–35)
AST: 37 U/L (ref 0–37)
Albumin: 4.1 g/dL (ref 3.5–5.2)
Alkaline Phosphatase: 66 U/L (ref 39–117)
BUN: 15 mg/dL (ref 6–23)
CO2: 33 meq/L — ABNORMAL HIGH (ref 19–32)
Calcium: 10.2 mg/dL (ref 8.4–10.5)
Chloride: 96 meq/L (ref 96–112)
Creatinine, Ser: 0.56 mg/dL (ref 0.40–1.20)
GFR: 107.23 mL/min
Glucose, Bld: 105 mg/dL — ABNORMAL HIGH (ref 70–99)
Potassium: 4.8 meq/L (ref 3.5–5.1)
Sodium: 135 meq/L (ref 135–145)
Total Bilirubin: 0.5 mg/dL (ref 0.2–1.2)
Total Protein: 7 g/dL (ref 6.0–8.3)

## 2017-12-04 LAB — VITAMIN D 25 HYDROXY (VIT D DEFICIENCY, FRACTURES): VITD: 92.36 ng/mL (ref 30.00–100.00)

## 2017-12-04 LAB — VITAMIN B12: Vitamin B-12: 1037 pg/mL — ABNORMAL HIGH (ref 211–911)

## 2017-12-04 MED ORDER — CICLOPIROX 8 % EX SOLN: Freq: Every day | CUTANEOUS | 0 refills | Status: DC

## 2017-12-04 MED ORDER — AZELASTINE HCL 0.1 % NA SOLN: 1.0000 | Freq: Two times a day (BID) | NASAL | 0 refills | Status: DC

## 2017-12-04 MED ORDER — MUPIROCIN 2 % EX OINT: TOPICAL_OINTMENT | CUTANEOUS | 0 refills | Status: DC

## 2017-12-04 MED ORDER — KETOCONAZOLE 2 % EX CREA: 1.0000 "application " | TOPICAL_CREAM | Freq: Every day | CUTANEOUS | 1 refills | Status: DC

## 2017-12-04 NOTE — Progress Notes (Signed)
Subjective:    Patient ID: Tracey Holt, female    DOB: 1922-11-14, 84 y.o.   MRN: [id number]  HPI  Pt in for follow up.  Pt states both toes have toenail fungus. She wants to get rid of this. She has been using some penlac in recent past but only used for one week per her report. She got inpatient. She wanted to see faster results.  Also on chart review. Pt has some fatigue unsepcified in past for severeal months.. Dr Lorelei Pont saw her last time and did UA. No uti found early December.  Pt wants to know if maybe b12 level low.  No current UTI type symptoms.  Patient's mental status appears good today.  Pt has slight redness to tip of nose. 5 years ago had biopsy of the area and negative. Ever since then she states will occur intermittently. She will use otc antibiotic and the area will improve sometimes but very slowly.. Will go away and then come back. Most recently mild pinkish dot present 2 weeks ago. No pain.      Review of Systems  Constitutional: Negative for chills, fatigue and fever.  Respiratory: Negative for cough, chest tightness, shortness of breath and wheezing.   Cardiovascular: Negative for chest pain and palpitations.  Gastrointestinal: Negative for abdominal distention, abdominal pain, diarrhea and vomiting.  Musculoskeletal: Negative for back pain.  Skin: Negative for rash.       See HPI  Neurological: Negative for dizziness, speech difficulty, weakness and headaches.  Hematological: Negative for adenopathy. Does not bruise/bleed easily.  Psychiatric/Behavioral: Negative for behavioral problems and confusion.   Past Medical History:  Diagnosis Date  . Bradycardic cardiac arrest ([place]) 07/24/2016   Required CPR, intubation with multiple rib fractures. Had short term cardiac shock with acute heart failure.  Resulted in possible stress-induced cardiomyopathy  . Cardiac related syncope 07/24/2016   Bradycardic arrest requiring CPR 2 with transvenous pacemaker placed  followed by permanent pacemaker; complicated by respiratory arrest, type II MI, stress-induced cardiopathy  . Cardiomyopathy ([place]) 07/2016   Moderate severely reduced EF with apical hypokinesis suggestive of stress-induced cardiac myopathy versus multivessel CAD --> in setting of her to cardiac arrest  . COPD (chronic obstructive pulmonary disease) ([place])    By report  . History of chronic constipation   . Hypercholesterolemia   . Osteoporosis   . Pacemaker 07/26/2016   Abbott dual-chamber pacemaker: [place] #[id number] - Serial [id number]  . Pneumonia 08/2016  . Sarcoidosis of lung ([place])   . Spinal stenosis      Social History   Socioeconomic History  . Marital status: Widowed    Spouse name: Not on file  . Number of children: Not on file  . Years of education: Not on file  . Highest education level: Not on file  Social Needs  . Financial resource strain: Not on file  . Food insecurity - worry: Not on file  . Food insecurity - inability: Not on file  . Transportation needs - medical: Not on file  . Transportation needs - non-medical: Not on file  Occupational History  . Not on file  Tobacco Use  . Smoking status: Former Smoker    Packs/day: 1.50    Years: 17.00    Pack years: 25.50    Types: Cigarettes    Start date: 12/18/1963    Last attempt to quit: 11/20/1983    Years since quitting: 34.0  . Smokeless tobacco: Never Used  Substance and Sexual Activity  .  Alcohol use: No  . Drug use: No  . Sexual activity: Not Currently  Other Topics Concern  . Not on file  Social History Narrative   Recently moved to [place] from [place] to live near her daughter.    Past Surgical History:  Procedure Laterality Date  . ADENOIDECTOMY    . PACEMAKER INSERTION Left 07/26/2016   Abbott Assurity Model #ZR00762 - Serial #[id number] -- complicated by pneumothorax requiring chest tube placement  . TEE WITHOUT CARDIOVERSION  02/2013   EF 55-60% with normal global  function. No thrombus, mass or vegetation. Trivial-small pericardial effusion. Moderate LA dilation. Mild MR.  . TONSILLECTOMY    . TRANSTHORACIC ECHOCARDIOGRAM  07/24/2016   [facility]: EF 45-50%. Mid and apical inferior, inferolateral and apical hypokinesis. GR 2 DD. Mild LA and moderate RA dilation. Mild paracardial effusion. Moderate to severe TR. Basal and mid segment hypokinesis with apical hypokinesis. - Suspect stress-induced cardiopathy versus multivessel CAD.  Marland Kitchen TRANSTHORACIC ECHOCARDIOGRAM  07/26/2016   [place]: LIMITED (post PPM) - although the report suggested EF 30-35% with apical akinesis, rounding note suggested this was an improvement    Family History  Problem Relation Age of Onset  . Allergic rhinitis Neg Hx   . Angioedema Neg Hx   . Asthma Neg Hx   . Eczema Neg Hx   . Immunodeficiency Neg Hx   . Urticaria Neg Hx     Allergies  Allergen Reactions  . Lidex [Fluocinonide] Swelling    Caused lip swelling  . Levaquin [Levofloxacin In D5w]     Couldn't breath   . Penicillins     Arms swelling   . Rifampin     Couldn't breath     Current Outpatient Medications on File Prior to Visit  Medication Sig Dispense Refill  . Ascorbic Acid (VITAMIN C) 1000 MG tablet Take 1,000 mg by mouth daily.    Marland Kitchen aspirin EC 81 MG tablet Take 81 mg by mouth daily.    Marland Kitchen atorvastatin (LIPITOR) 10 MG tablet Take 1/2 tablet (5mg ) by mouth daily 45 tablet 3  . Biotin 10000 MCG TABS Take 1 capsule by mouth daily.    . Calcium Citrate-Vitamin D (CITRACAL + D PO) Take 1 tablet by mouth 2 (two) times daily.    Marland Kitchen docusate sodium (COLACE) 100 MG capsule Take 100 mg by mouth daily.    Marland Kitchen loratadine (CLARITIN) 10 MG tablet Take 10 mg by mouth daily.    . LUTEIN PO Take 1 tablet by mouth daily.    . Misc Natural Products (GLUCOS-CHONDROIT-MSM COMPLEX) TABS Take 2 tablets by mouth daily.    . Multiple Vitamins-Minerals (MULTIVITAMIN WITH MINERALS)  tablet Take 1 tablet by mouth daily.    . Probiotic CAPS Take 1 capsule by mouth daily.     No current facility-administered medications on file prior to visit.     BP (!) 148/78   Pulse 74   Temp 98.1 F (36.7 C) (Oral)   Resp 16   Ht 4\' 10"  (1.473 m)   Wt 96 lb (43.5 kg)   SpO2 94%   BMI 20.06 kg/m       Objective:   Physical Exam  General- No acute distress. Pleasant patient. Neck- Full range of motion, no jvd Lungs- Clear, even and unlabored. Heart- regular rate and rhythm. Neurologic- CNII- XII grossly intact.  Skin- thick, yellow, disfigured nails both feet. some thick yellow nail rt thumb Tip of nose. Faint tiny  pin point pinkish/red area.       Assessment & Plan:  For fatigue we will get cbc, cmp, b12, b1, and vitamin D.   For toenail fungus and some on thumb refilled penlac. Go through the full treatment.  For your slight red area tip of nose will rx mupirocin. If area does not clear will need to refer you back to dermatologist  for biopsy.  Follow up in 2 weeks or as needed

## 2017-12-04 NOTE — Patient Instructions (Addendum)
For fatigue we will get cbc, cmp, b12, b1, and vitamin D.   For toenail fungus and some on thumb refilled penlac. Go through the full treatment.  For your slight red area tip of nose will rx mupirocin. If area does not clear will need to refer you back to dermatologist  for biopsy.  Follow up in 2 weeks or as needed

## 2017-12-06 ENCOUNTER — Telehealth: Payer: Self-pay | Admitting: Medical

## 2017-12-06 NOTE — Telephone Encounter (Signed)
Copied from [place] [id number]. Topic: Quick Communication - See Telephone Encounter >> Dec 06, 2017 11:54 AM Bea Graff, NT wrote: CRM for notification. See Telephone encounter for: Pt states she was prescribed ketoconazole (NIZORAL) on Wednesday to apply to her nose. She said it doesn't look any better and would like to know some suggestions of what she can do and if she needs to stop the medication.   12/06/17.

## 2017-12-06 NOTE — Telephone Encounter (Signed)
Pt of [place] with questions regarding the Nizoral.

## 2017-12-06 NOTE — Telephone Encounter (Signed)
I talked with patient today.  She was actually applying mupirocin to her nose as directed.  She was not applying ketoconazole as the note from patient engagement center states.  She states that the mupirocin alone did not help at all.  She wants to go back to her bacitracin.  I advised her she could do that but if the nose area does not get better by 3-5 days notify me and I will refer to dermatology.  Patient expressed understanding.

## 2017-12-09 ENCOUNTER — Telehealth: Payer: Self-pay | Admitting: Medical

## 2017-12-09 ENCOUNTER — Telehealth: Payer: Self-pay

## 2017-12-09 DIAGNOSIS — L989 Disorder of the skin and subcutaneous tissue, unspecified: Secondary | ICD-10-CM

## 2017-12-09 LAB — VITAMIN B1: VITAMIN B1 (THIAMINE): 73 nmol/L — AB (ref 8–30)

## 2017-12-09 NOTE — Telephone Encounter (Signed)
See referral to dermatologist.

## 2017-12-09 NOTE — Telephone Encounter (Signed)
Copied from [place] [id number]. Topic: Referral - Request >> Dec 09, 2017  9:56 AM Tracey Holt wrote: Patient is requesting to see a dermatologist. Call back is [phone] No female dr's she said.

## 2017-12-09 NOTE — Telephone Encounter (Signed)
On the referral will you specify/remind Gwen no female MD. Am I understanding that correct this is patient request?

## 2017-12-10 NOTE — Telephone Encounter (Signed)
Referral sent to [facility], requesting female provider per pt.

## 2017-12-10 NOTE — Telephone Encounter (Signed)
[facility] dermatology is fine.  Ask for female provider at patient's request.

## 2017-12-18 ENCOUNTER — Telehealth: Payer: Self-pay | Admitting: Cardiology

## 2017-12-18 ENCOUNTER — Ambulatory Visit (INDEPENDENT_AMBULATORY_CARE_PROVIDER_SITE_OTHER): Payer: Medicare HMO | Admitting: *Deleted

## 2017-12-18 DIAGNOSIS — I442 Atrioventricular block, complete: Secondary | ICD-10-CM

## 2017-12-18 NOTE — Telephone Encounter (Signed)
LMOVM reminding pt to send remote transmission.   

## 2017-12-19 ENCOUNTER — Encounter: Payer: Self-pay | Admitting: Cardiology

## 2017-12-19 ENCOUNTER — Telehealth: Payer: Self-pay | Admitting: Medical

## 2017-12-19 NOTE — Progress Notes (Signed)
Remote pacemaker transmission.   

## 2017-12-19 NOTE — Telephone Encounter (Signed)
Copied from [place] [id number]. Topic: Quick Communication - See Telephone Encounter >> Dec 19, 2017  4:20 PM Bea Graff, NT wrote: CRM for notification. See Telephone encounter for: Pt requesting a refill of lezocetirizine 5mg [facility] [address]  12/19/17.

## 2017-12-19 NOTE — Telephone Encounter (Signed)
lezocetirizine 5mg  refill Last OV: 10/23/17 Last Refill:Not listed on medication profile [facility] [address]

## 2017-12-20 MED ORDER — LEVOCETIRIZINE DIHYDROCHLORIDE 5 MG PO TABS: 5.0000 mg | ORAL_TABLET | Freq: Every evening | ORAL | 0 refills | Status: DC

## 2017-12-20 NOTE — Telephone Encounter (Signed)
Please advise 

## 2017-12-20 NOTE — Telephone Encounter (Signed)
Sent in a prescription of Xyzal to patient's pharmacy.  Clarified okay dosing since epic dated I gave excessive amount.  Instructions were one daily only.

## 2017-12-24 ENCOUNTER — Ambulatory Visit: Payer: Medicare HMO | Admitting: Medical

## 2017-12-25 ENCOUNTER — Ambulatory Visit: Payer: Medicare HMO | Admitting: Medical

## 2017-12-26 ENCOUNTER — Ambulatory Visit (INDEPENDENT_AMBULATORY_CARE_PROVIDER_SITE_OTHER): Payer: Medicare HMO | Admitting: Medical

## 2017-12-26 ENCOUNTER — Encounter: Payer: Self-pay | Admitting: Medical

## 2017-12-26 VITALS — BP 131/74 | HR 86 | Temp 98.6°F | Resp 16 | Ht <= 58 in | Wt 94.6 lb

## 2017-12-26 DIAGNOSIS — R05 Cough: Secondary | ICD-10-CM | POA: Diagnosis not present

## 2017-12-26 DIAGNOSIS — R5383 Other fatigue: Secondary | ICD-10-CM | POA: Diagnosis not present

## 2017-12-26 DIAGNOSIS — R059 Cough, unspecified: Secondary | ICD-10-CM

## 2017-12-26 DIAGNOSIS — B351 Tinea unguium: Secondary | ICD-10-CM

## 2017-12-26 NOTE — Progress Notes (Signed)
Subjective:    Patient ID: Tracey Holt, female    DOB: 1922/06/03, 84 y.o.   MRN: [id number]  HPI  Pt in for follow up.  Pt had some fatigue on last visit.Blood work up was negative. Did not reveal cause. Normal b vitamins,  Normal vitamin D, no anemia, and good kidney function. Pt is aware that fatigue may be age related.  Pt has no uti symptoms. No fever. Usually sleeps 8 hours a night.  Pt is on penlac for toenail fungus.   Pt has intermittent cough past 2 weeks and some productive. Will bring up some mucus at night when she coughs. cxr in past showed some bronchitis. Possible copd. Smoked 1-2 packs a day for 30 years. Stopped in her 8's.     Review of Systems  Constitutional: Negative for chills, fatigue and fever.  HENT: Negative for congestion, drooling and ear discharge.   Respiratory: Positive for cough. Negative for chest tightness, shortness of breath and wheezing.   Cardiovascular: Negative for chest pain and palpitations.  Gastrointestinal: Negative for abdominal distention.  Musculoskeletal: Negative for back pain and neck pain.  Skin: Negative for rash.       Toe nail thick and discolored.   See hpi regarding small area on nose.  Neurological: Negative for dizziness, seizures, weakness, light-headedness and headaches.  Hematological: Negative for adenopathy. Does not bruise/bleed easily.  Psychiatric/Behavioral: Negative for behavioral problems, confusion and self-injury. The patient is not nervous/anxious.     Past Medical History:  Diagnosis Date  . Bradycardic cardiac arrest ([place]) 07/24/2016   Required CPR, intubation with multiple rib fractures. Had short term cardiac shock with acute heart failure.  Resulted in possible stress-induced cardiomyopathy  . Cardiac related syncope 07/24/2016   Bradycardic arrest requiring CPR 2 with transvenous pacemaker placed followed by permanent pacemaker; complicated by respiratory arrest, type II MI, stress-induced  cardiopathy  . Cardiomyopathy ([place]) 07/2016   Moderate severely reduced EF with apical hypokinesis suggestive of stress-induced cardiac myopathy versus multivessel CAD --> in setting of her to cardiac arrest  . COPD (chronic obstructive pulmonary disease) ([place])    By report  . History of chronic constipation   . Hypercholesterolemia   . Osteoporosis   . Pacemaker 07/26/2016   Abbott dual-chamber pacemaker: [place] #[id number] - Serial [id number]  . Pneumonia 08/2016  . Sarcoidosis of lung ([place])   . Spinal stenosis      Social History   Socioeconomic History  . Marital status: Widowed    Spouse name: Not on file  . Number of children: Not on file  . Years of education: Not on file  . Highest education level: Not on file  Social Needs  . Financial resource strain: Not on file  . Food insecurity - worry: Not on file  . Food insecurity - inability: Not on file  . Transportation needs - medical: Not on file  . Transportation needs - non-medical: Not on file  Occupational History  . Not on file  Tobacco Use  . Smoking status: Former Smoker    Packs/day: 1.50    Years: 17.00    Pack years: 25.50    Types: Cigarettes    Start date: 12/18/1963    Last attempt to quit: 11/20/1983    Years since quitting: 34.1  . Smokeless tobacco: Never Used  Substance and Sexual Activity  . Alcohol use: No  . Drug use: No  . Sexual activity: Not Currently  Other Topics Concern  .  Not on file  Social History Narrative   Recently moved to [place] from [place] to live near her daughter.    Past Surgical History:  Procedure Laterality Date  . ADENOIDECTOMY    . PACEMAKER INSERTION Left 07/26/2016   Abbott Assurity Model #[id number] - Serial #[id number] -- complicated by pneumothorax requiring chest tube placement  . TEE WITHOUT CARDIOVERSION  02/2013   EF 55-60% with normal global function. No thrombus, mass or vegetation. Trivial-small pericardial effusion. Moderate LA  dilation. Mild MR.  . TONSILLECTOMY    . TRANSTHORACIC ECHOCARDIOGRAM  07/24/2016   [facility]: EF 45-50%. Mid and apical inferior, inferolateral and apical hypokinesis. GR 2 DD. Mild LA and moderate RA dilation. Mild paracardial effusion. Moderate to severe TR. Basal and mid segment hypokinesis with apical hypokinesis. - Suspect stress-induced cardiopathy versus multivessel CAD.  Marland Kitchen TRANSTHORACIC ECHOCARDIOGRAM  07/26/2016   [place]: LIMITED (post PPM) - although the report suggested EF 30-35% with apical akinesis, rounding note suggested this was an improvement    Family History  Problem Relation Age of Onset  . Allergic rhinitis Neg Hx   . Angioedema Neg Hx   . Asthma Neg Hx   . Eczema Neg Hx   . Immunodeficiency Neg Hx   . Urticaria Neg Hx     Allergies  Allergen Reactions  . Lidex [Fluocinonide] Swelling    Caused lip swelling  . Levaquin [Levofloxacin In D5w]     Couldn't breath   . Penicillins     Arms swelling   . Rifampin     Couldn't breath     Current Outpatient Medications on File Prior to Visit  Medication Sig Dispense Refill  . Ascorbic Acid (VITAMIN C) 1000 MG tablet Take 1,000 mg by mouth daily.    Marland Kitchen aspirin EC 81 MG tablet Take 81 mg by mouth daily.    Marland Kitchen atorvastatin (LIPITOR) 10 MG tablet Take 1/2 tablet (5mg ) by mouth daily 45 tablet 3  . azelastine (ASTELIN) 0.1 % nasal spray Place 1 spray into both nostrils 2 (two) times daily. Use in each nostril as directed 30 mL 0  . Biotin 10000 MCG TABS Take 1 capsule by mouth daily.    . Calcium Citrate-Vitamin D (CITRACAL + D PO) Take 1 tablet by mouth 2 (two) times daily.    . ciclopirox (PENLAC) 8 % solution Apply topically at bedtime. Apply over nail and surrounding skin. Apply daily over previous coat. After seven (7) days, may remove with alcohol and continue cycle. 6.6 mL 0  . docusate sodium (COLACE) 100 MG capsule Take 100 mg by mouth daily.    Marland Kitchen ketoconazole  (NIZORAL) 2 % cream Apply 1 application topically daily. 15 g 1  . levocetirizine (XYZAL) 5 MG tablet Take 1 tablet (5 mg total) by mouth every evening. 30 tablet 0  . loratadine (CLARITIN) 10 MG tablet Take 10 mg by mouth daily.    . LUTEIN PO Take 1 tablet by mouth daily.    . Misc Natural Products (GLUCOS-CHONDROIT-MSM COMPLEX) TABS Take 2 tablets by mouth daily.    . Multiple Vitamins-Minerals (MULTIVITAMIN WITH MINERALS) tablet Take 1 tablet by mouth daily.    . mupirocin ointment (BACTROBAN) 2 % Apply to tip of nose twice daily 22 g 0  . Probiotic CAPS Take 1 capsule by mouth daily.     No current facility-administered medications on file prior to visit.     BP 131/74 (BP Location: Left Arm,  Patient Position: Sitting, Cuff Size: Normal)   Pulse 86   Temp 98.6 F (37 C) (Oral)   Resp 16   Ht 4\' 10"  (1.473 m)   Wt 94 lb 9.6 oz (42.9 kg)   SpO2 95%   BMI 19.77 kg/m       Objective:   Physical Exam   General- No acute distress. Pleasant patient. Neck- Full range of motion, no jvd Lungs- Clear, even and unlabored. Heart- regular rate and rhythm. Neurologic- CNII- XII grossly intact.  Skin- faint small pimple at tip of nose(better than last time) Feet- thick great toenail discolored  and other nails as well.      Assessment & Plan:  For recent productive cough and slight fatigue, I decided to prescribe you a azithromycin for bronchitis.  Listening to your lungs I do not think you have pneumonia but am concerned for bronchitis.  Note reviewed prior x-ray done earlier in the year.  Also history of smoking remotely 30 years ago.  Your fatigue recently may be bronchitis related.  Lab workup did not reveal any cause.  For the faint small raised area on tip of your nose, I want to use mupirocin alone twice daily for 7 days and give [place] an update on how that area looks.  Unfortunately dermatology appointment has been delayed.  For toenail fungus, I am going to try to refer you  to a podiatrist.  Other option is to get opinion from a dermatologist who will evaluate your nose in April  Follow-up date 3 weeks or as needed.  After speaking with the medical assistant, patient expressed that she declined podiatry referral and will continue with Penlac    Mackie Pai, PA-C

## 2017-12-26 NOTE — Patient Instructions (Addendum)
For recent productive cough and slight fatigue, I decided to prescribe you a azithromycin for bronchitis.  Listening to your lungs I do not think you have pneumonia but am concerned for bronchitis.  Note reviewed prior x-ray done earlier in the year.  Also history of smoking remotely 30 years ago.  Your fatigue recently may be bronchitis related.  Lab workup did not reveal any cause.  For the faint small raised area on tip of your nose, I want to use mupirocin alone twice daily for 7 days and give [place] an update on how that area looks.  Unfortunately dermatology appointment has been delayed.  For toenail fungus, I am going to try to refer you to a podiatrist.  Other option is to get opinion from a dermatologist who will evaluate your nose in April  Follow-up date 3 weeks or as needed.  After speaking with the medical assistant, patient expressed that she declined podiatry referral and will continue with Penlac.

## 2017-12-27 ENCOUNTER — Telehealth: Payer: Self-pay | Admitting: Medical

## 2017-12-27 MED ORDER — AZITHROMYCIN 250 MG PO TABS: ORAL_TABLET | ORAL | 0 refills | Status: DC

## 2017-12-27 NOTE — Telephone Encounter (Signed)
Sent I azithromycin to pt pharmacy.

## 2017-12-27 NOTE — Telephone Encounter (Signed)
Pt is asking about the z pack that was supposed to be called in for her yesterday. [facility] [address]  Please advise.

## 2017-12-27 NOTE — Telephone Encounter (Signed)
Copied from [place]. Topic: Quick Communication - See Telephone Encounter >> Dec 27, 2017  9:08 AM Burnis Medin, NT wrote: CRM for notification. See Telephone encounter for: Patient called in and said the she was seen in the office yesterday and the doctor was supposed  to call in a prescription for a Z pap but it hasn't been done yet. [id number] [address] [place] [phone] (Phone) [phone] (Fax)    12/27/17.

## 2018-01-02 ENCOUNTER — Ambulatory Visit (INDEPENDENT_AMBULATORY_CARE_PROVIDER_SITE_OTHER): Payer: Medicare HMO | Admitting: Medical

## 2018-01-02 ENCOUNTER — Telehealth: Payer: Self-pay | Admitting: Medical

## 2018-01-02 ENCOUNTER — Ambulatory Visit (HOSPITAL_BASED_OUTPATIENT_CLINIC_OR_DEPARTMENT_OTHER)
Admission: RE | Admit: 2018-01-02 | Discharge: 2018-01-02 | Disposition: A | Discharge status: Home / Self Care | Payer: Medicare HMO | Source: Ambulatory Visit | Attending: Medical | Admitting: Medical

## 2018-01-02 ENCOUNTER — Ambulatory Visit: Payer: Self-pay | Admitting: *Deleted

## 2018-01-02 ENCOUNTER — Ambulatory Visit: Payer: Medicare HMO | Admitting: Medical

## 2018-01-02 ENCOUNTER — Encounter: Payer: Self-pay | Admitting: Medical

## 2018-01-02 VITALS — BP 151/84 | HR 86 | Temp 97.9°F | Resp 18 | Ht <= 58 in | Wt 94.8 lb

## 2018-01-02 DIAGNOSIS — J439 Emphysema, unspecified: Secondary | ICD-10-CM | POA: Diagnosis not present

## 2018-01-02 DIAGNOSIS — R05 Cough: Secondary | ICD-10-CM | POA: Insufficient documentation

## 2018-01-02 DIAGNOSIS — R059 Cough, unspecified: Secondary | ICD-10-CM

## 2018-01-02 DIAGNOSIS — R062 Wheezing: Secondary | ICD-10-CM

## 2018-01-02 DIAGNOSIS — R0600 Dyspnea, unspecified: Secondary | ICD-10-CM

## 2018-01-02 DIAGNOSIS — J40 Bronchitis, not specified as acute or chronic: Secondary | ICD-10-CM | POA: Insufficient documentation

## 2018-01-02 DIAGNOSIS — I7090 Unspecified atherosclerosis: Secondary | ICD-10-CM | POA: Insufficient documentation

## 2018-01-02 LAB — CBC WITH DIFFERENTIAL/PLATELET
BASOS PCT: 1 % (ref 0.0–3.0)
Basophils Absolute: 0 10*3/uL (ref 0.0–0.1)
EOS ABS: 0.1 10*3/uL (ref 0.0–0.7)
EOS PCT: 1.4 % (ref 0.0–5.0)
HEMATOCRIT: 39.9 % (ref 36.0–46.0)
HEMOGLOBIN: 13.1 g/dL (ref 12.0–15.0)
LYMPHS PCT: 25.2 % (ref 12.0–46.0)
Lymphs Abs: 1.1 10*3/uL (ref 0.7–4.0)
MCHC: 32.8 g/dL (ref 30.0–36.0)
MCV: 87.9 fl (ref 78.0–100.0)
MONO ABS: 0.8 10*3/uL (ref 0.1–1.0)
Monocytes Relative: 17 % — ABNORMAL HIGH (ref 3.0–12.0)
Neutro Abs: 2.5 10*3/uL (ref 1.4–7.7)
Neutrophils Relative %: 55.4 % (ref 43.0–77.0)
Platelets: 186 10*3/uL (ref 150.0–400.0)
RBC: 4.54 Mil/uL (ref 3.87–5.11)
RDW: 14.2 % (ref 11.5–15.5)
WBC: 4.5 10*3/uL (ref 4.0–10.5)

## 2018-01-02 LAB — COMPREHENSIVE METABOLIC PANEL
ALBUMIN: 4.1 g/dL (ref 3.5–5.2)
ALT: 28 U/L (ref 0–35)
AST: 35 U/L (ref 0–37)
Alkaline Phosphatase: 65 U/L (ref 39–117)
BUN: 14 mg/dL (ref 6–23)
CALCIUM: 9.2 mg/dL (ref 8.4–10.5)
CO2: 34 mEq/L — ABNORMAL HIGH (ref 19–32)
CREATININE: 0.58 mg/dL (ref 0.40–1.20)
Chloride: 97 mEq/L (ref 96–112)
GFR: 102.96 mL/min (ref >60.00)
Glucose, Bld: 98 mg/dL (ref 70–99)
Potassium: 4.2 mEq/L (ref 3.5–5.1)
SODIUM: 133 meq/L — AB (ref 135–145)
TOTAL PROTEIN: 6.8 g/dL (ref 6.0–8.3)
Total Bilirubin: 0.6 mg/dL (ref 0.2–1.2)

## 2018-01-02 LAB — CUP PACEART REMOTE DEVICE CHECK
Brady Statistic AP VP Percent: 42 %
Brady Statistic AS VP Percent: 58 %
Brady Statistic AS VS Percent: 1 %
Brady Statistic RA Percent Paced: 41 %
Brady Statistic RV Percent Paced: 99 %
Implantable Lead Implant Date: 20170907
Implantable Lead Location: 753859
Lead Channel Impedance Value: 430 Ohm
Lead Channel Pacing Threshold Amplitude: 0.75 V
Lead Channel Pacing Threshold Pulse Width: 0.4 ms
Lead Channel Sensing Intrinsic Amplitude: 8.8 mV
Lead Channel Setting Pacing Amplitude: 2.5 V
Lead Channel Setting Pacing Amplitude: 2.5 V
MDC IDC LEAD IMPLANT DT: 20170907
MDC IDC LEAD LOCATION: 753860
MDC IDC MSMT BATTERY REMAINING LONGEVITY: 96 mo
MDC IDC MSMT BATTERY REMAINING PERCENTAGE: 95.5 %
MDC IDC MSMT BATTERY VOLTAGE: 2.99 V
MDC IDC MSMT LEADCHNL RA IMPEDANCE VALUE: 410 Ohm
MDC IDC MSMT LEADCHNL RA PACING THRESHOLD PULSEWIDTH: 0.4 ms
MDC IDC MSMT LEADCHNL RA SENSING INTR AMPL: 3.9 mV
MDC IDC MSMT LEADCHNL RV PACING THRESHOLD AMPLITUDE: 0.5 V
MDC IDC PG IMPLANT DT: 20170907
MDC IDC PG SERIAL: 7944486
MDC IDC SESS DTM: 20190130193855
MDC IDC SET LEADCHNL RV PACING PULSEWIDTH: 0.4 ms
MDC IDC SET LEADCHNL RV SENSING SENSITIVITY: 2 mV
MDC IDC STAT BRADY AP VS PERCENT: 1 %

## 2018-01-02 LAB — BRAIN NATRIURETIC PEPTIDE: Pro B Natriuretic peptide (BNP): 76 pg/mL (ref 0.0–100.0)

## 2018-01-02 MED ORDER — SULFAMETHOXAZOLE-TRIMETHOPRIM 400-80 MG PO TABS: 1.0000 | ORAL_TABLET | Freq: Two times a day (BID) | ORAL | 0 refills | Status: DC

## 2018-01-02 MED ORDER — ALBUTEROL SULFATE HFA 108 (90 BASE) MCG/ACT IN AERS: 2.0000 | INHALATION_SPRAY | Freq: Four times a day (QID) | RESPIRATORY_TRACT | 2 refills | Status: DC | PRN

## 2018-01-02 MED ORDER — BENZONATATE 100 MG PO CAPS: 100.0000 mg | ORAL_CAPSULE | Freq: Three times a day (TID) | ORAL | 0 refills | Status: DC | PRN

## 2018-01-02 NOTE — Patient Instructions (Addendum)
You have symptoms of bronchitis with some concern for possible pneumonia.  You do overall appear worse than before despite the use of a azithromycin.  We have the weekend approaching and I do want to go ahead and get chest x-ray, CBC and CMP.  I am going to prescribe benzonatate low-dose for cough and Flonase for nasal congestion.  If you have any wheezing, I am making albuterol inhaler available.  I do want to fall the chest x-ray results and prescribe another antibiotic.  I am leaning probably going to prescribe doxycycline.  It is a good antibiotic that can treat bronchitis and pneumonia.  I do not think azithromycin is a good option as he just finished this recently.  Your history of penicillin allergy and Levaquin allergy does severely restrict choice of antibiotics.  If after chest x-ray review I prescribe doxycycline then please make sure that you do not take doxycycline on an empty stomach.   If you have worsening signs and symptoms after hours or over the weekend then emergency department evaluation.  Follow-up Monday with me or as needed.  Note regarding choice of antibiotics, I discussed options with both patient and her son.  Initially did want to write doxycycline but patient indicates that she has memory of having some side effects with doxycycline and earlier in the visit she mentioned it was not just GI side effect.  She just recently had been on a azithromycin so do not want to repeat that antibiotic.  She has quinolone and penicillin allergy which are moderate to severe.  Keflex would be limited due to penicillin allergy.  Since patient was reluctant to try doxycycline, I will go ahead and write Bactrim.  Chest x-ray did not show pneumonia and her WBC was not elevated.  However with her very rough breath sounds on auscultation I do want to write her another antibiotic.   I did talk with son later today and he wants to check her her tomorrow and on Saturday to see if she is worsening  before starting antibiotic.  I did discuss with him the x-ray findings.  And explained to him my caution on potential early infection getting out of hand in the very elderly population.  He is also going to check her temperature and see if it is above temp is above 100.  If she is worsening or running fevers and will start antibiotic.  I did explain to him she might also benefit from a short taper dose of prednisone in light of the emphysema type findings on x-ray.  But wanted to see how she did first on the antibiotic.  Either way explained to sign that I want her to have appointment on Monday at 2 PM so I can recheck your incident make sure she is improving.  Son expressed understanding.  Notified pt of plan as well.

## 2018-01-02 NOTE — Progress Notes (Signed)
Subjective:    Patient ID: Tracey Holt, female    DOB: 07-28-1922, 84 y.o.   MRN: [id number]  HPI  Pt in for follow up.  On her last visit she had intermittent cough past 2 weeks and some productive. Will bring up some mucus at night when she coughs. cxr in past showed some bronchitis. Possible copd. Smoked 1-2 packs a day for 30 years. Stopped in her 30's.   I had written her azithromycin antibiotic. No chest xray done.  She report last night she go nasal and chest congestion. Also describing productive cough.  She does have little wheeze on and off.   Pt lives at [facility].    Review of Systems  Constitutional: Negative for chills.  HENT: Positive for congestion. Negative for ear discharge, ear pain, hearing loss, sinus pressure, sneezing and sore throat.   Respiratory: Positive for cough and wheezing. Negative for shortness of breath.   Cardiovascular: Negative for chest pain and palpitations.  Gastrointestinal: Negative for abdominal pain, constipation, diarrhea, nausea and vomiting.  Musculoskeletal: Negative for back pain, gait problem, myalgias and neck pain.  Skin: Negative for rash.  Neurological: Negative for dizziness and headaches.  Hematological: Negative for adenopathy. Does not bruise/bleed easily.  Psychiatric/Behavioral: Negative for behavioral problems and confusion.    Past Medical History:  Diagnosis Date  . Bradycardic cardiac arrest ([place]) 07/24/2016   Required CPR, intubation with multiple rib fractures. Had short term cardiac shock with acute heart failure.  Resulted in possible stress-induced cardiomyopathy  . Cardiac related syncope 07/24/2016   Bradycardic arrest requiring CPR 2 with transvenous pacemaker placed followed by permanent pacemaker; complicated by respiratory arrest, type II MI, stress-induced cardiopathy  . Cardiomyopathy ([place]) 07/2016   Moderate severely reduced EF with apical hypokinesis suggestive of stress-induced cardiac  myopathy versus multivessel CAD --> in setting of her to cardiac arrest  . COPD (chronic obstructive pulmonary disease) ([place])    By report  . History of chronic constipation   . Hypercholesterolemia   . Osteoporosis   . Pacemaker 07/26/2016   Abbott dual-chamber pacemaker: Esto #[id number] - Serial [id number]  . Pneumonia 08/2016  . Sarcoidosis of lung ([place])   . Spinal stenosis      Social History   Socioeconomic History  . Marital status: Widowed    Spouse name: Not on file  . Number of children: Not on file  . Years of education: Not on file  . Highest education level: Not on file  Social Needs  . Financial resource strain: Not on file  . Food insecurity - worry: Not on file  . Food insecurity - inability: Not on file  . Transportation needs - medical: Not on file  . Transportation needs - non-medical: Not on file  Occupational History  . Not on file  Tobacco Use  . Smoking status: Former Smoker    Packs/day: 1.50    Years: 17.00    Pack years: 25.50    Types: Cigarettes    Start date: 12/18/1963    Last attempt to quit: 11/20/1983    Years since quitting: 34.1  . Smokeless tobacco: Never Used  Substance and Sexual Activity  . Alcohol use: No  . Drug use: No  . Sexual activity: Not Currently  Other Topics Concern  . Not on file  Social History Narrative   Recently moved to [place] from [place] to live near her daughter.    Past Surgical History:  Procedure Laterality Date  .  ADENOIDECTOMY    . PACEMAKER INSERTION Left 07/26/2016   Abbott Assurity Model #LF81017 - Serial #[id number] -- complicated by pneumothorax requiring chest tube placement  . TEE WITHOUT CARDIOVERSION  02/2013   EF 55-60% with normal global function. No thrombus, mass or vegetation. Trivial-small pericardial effusion. Moderate LA dilation. Mild MR.  . TONSILLECTOMY    . TRANSTHORACIC ECHOCARDIOGRAM  07/24/2016   [facility]: EF 45-50%. Mid and  apical inferior, inferolateral and apical hypokinesis. GR 2 DD. Mild LA and moderate RA dilation. Mild paracardial effusion. Moderate to severe TR. Basal and mid segment hypokinesis with apical hypokinesis. - Suspect stress-induced cardiopathy versus multivessel CAD.  Marland Kitchen TRANSTHORACIC ECHOCARDIOGRAM  07/26/2016   [place]: LIMITED (post PPM) - although the report suggested EF 30-35% with apical akinesis, rounding note suggested this was an improvement    Family History  Problem Relation Age of Onset  . Allergic rhinitis Neg Hx   . Angioedema Neg Hx   . Asthma Neg Hx   . Eczema Neg Hx   . Immunodeficiency Neg Hx   . Urticaria Neg Hx     Allergies  Allergen Reactions  . Lidex [Fluocinonide] Swelling    Caused lip swelling  . Levaquin [Levofloxacin In D5w]     Couldn't breath   . Penicillins     Arms swelling   . Rifampin     Couldn't breath     Current Outpatient Medications on File Prior to Visit  Medication Sig Dispense Refill  . Ascorbic Acid (VITAMIN C) 1000 MG tablet Take 1,000 mg by mouth daily.    Marland Kitchen aspirin EC 81 MG tablet Take 81 mg by mouth daily.    Marland Kitchen atorvastatin (LIPITOR) 10 MG tablet Take 1/2 tablet (5mg ) by mouth daily 45 tablet 3  . azelastine (ASTELIN) 0.1 % nasal spray Place 1 spray into both nostrils 2 (two) times daily. Use in each nostril as directed 30 mL 0  . Biotin 10000 MCG TABS Take 1 capsule by mouth daily.    . Calcium Citrate-Vitamin D (CITRACAL + D PO) Take 1 tablet by mouth 2 (two) times daily.    . ciclopirox (PENLAC) 8 % solution Apply topically at bedtime. Apply over nail and surrounding skin. Apply daily over previous coat. After seven (7) days, may remove with alcohol and continue cycle. 6.6 mL 0  . docusate sodium (COLACE) 100 MG capsule Take 100 mg by mouth daily.    Marland Kitchen ketoconazole (NIZORAL) 2 % cream Apply 1 application topically daily. 15 g 1  . levocetirizine (XYZAL) 5 MG tablet Take 1 tablet (5 mg total) by mouth  every evening. 30 tablet 0  . loratadine (CLARITIN) 10 MG tablet Take 10 mg by mouth daily.    . LUTEIN PO Take 1 tablet by mouth daily.    . Misc Natural Products (GLUCOS-CHONDROIT-MSM COMPLEX) TABS Take 2 tablets by mouth daily.    . Multiple Vitamins-Minerals (MULTIVITAMIN WITH MINERALS) tablet Take 1 tablet by mouth daily.    . mupirocin ointment (BACTROBAN) 2 % Apply to tip of nose twice daily 22 g 0  . Probiotic CAPS Take 1 capsule by mouth daily.     No current facility-administered medications on file prior to visit.     BP (!) 151/84 (BP Location: Left Arm, Patient Position: Sitting, Cuff Size: Normal)   Pulse 86   Temp 97.9 F (36.6 C) (Oral)   Resp 18   Ht 4\' 10"  (1.473 m)   Wt  94 lb 12.8 oz (43 kg)   SpO2 94%   BMI 19.81 kg/m       Objective:   Physical Exam  General  Mental Status - Alert. General Appearance - Well groomed. Not in acute distress.  Skin Rashes- No Rashes.  HEENT Head- Normal. Ear Auditory Canal - Left- Normal. Right - Normal.Tympanic Membrane- Left- Normal. Right- Normal. Eye Sclera/Conjunctiva- Left- Normal. Right- Normal. Nose & Sinuses Nasal Mucosa- Left-  Boggy and Congested. Right-  Boggy and  Congested.Bilateral maxillary and frontal sinus pressure. Mouth & Throat Lips: Upper Lip- Normal: no dryness, cracking, pallor, cyanosis, or vesicular eruption. Lower Lip-Normal: no dryness, cracking, pallor, cyanosis or vesicular eruption. Buccal Mucosa- Bilateral- No Aphthous ulcers. Oropharynx- No Discharge or Erythema. Tonsils: Characteristics- Bilateral- No Erythema or Congestion. Size/Enlargement- Bilateral- No enlargement. Discharge- bilateral-None.  Neck Neck- Supple. No Masses.   Chest and Lung Exam Auscultation: Breath Sounds:- even and unlabored but some diffuse coarse breath sounds. No wheezing heard.  Cardiovascular Auscultation:Rythm- Regular, rate and rhythm. Murmurs & Other Heart Sounds:Ausculatation of the heart reveal-  No Murmurs.  Lymphatic Head & Neck General Head & Neck Lymphatics: Bilateral: Description- No Localized lymphadenopathy.       Assessment & Plan:  You have symptoms of bronchitis with some concern for possible pneumonia.  You do overall appear worse than before despite the use of a azithromycin.  We have the weekend approaching and I do want to go ahead and get chest x-ray, CBC and CMP.  I am going to prescribe benzonatate low-dose for cough and Flonase for nasal congestion.  If you have any wheezing, I am making albuterol inhaler available.  I do want to fall the chest x-ray results and prescribe another antibiotic.  I am leaning probably going to prescribe doxycycline.  It is a good antibiotic that can treat bronchitis and pneumonia.  I do not think azithromycin is a good option as he just finished this recently.  Your history of penicillin allergy and Levaquin allergy does severely restrict choice of antibiotics.  If after chest x-ray review I prescribe doxycycline then please make sure that you do not take doxycycline on an empty stomach.   If you have worsening signs and symptoms after hours or over the weekend then emergency department evaluation.  Follow-up Monday with me or as needed.  Note regarding choice of antibiotics, I discussed options with both patient and her son.  Initially did want to write doxycycline but patient indicates that she has memory of having some side effects with doxycycline and earlier in the visit she mentioned it was not just GI side effect.  She just recently had been on a azithromycin so do not want to repeat that antibiotic.  She has quinolone and penicillin allergy which are moderate to severe.  Keflex would be limited due to penicillin allergy.  Since patient was reluctant to try doxycycline, I will go ahead and write Bactrim.  Chest x-ray did not show pneumonia and her WBC was not elevated.  However with her very rough breath sounds on auscultation I do  want to write her another antibiotic.  Mackie Pai, PA-C

## 2018-01-02 NOTE — Telephone Encounter (Signed)
FYI. Appt at 5:45 PM.

## 2018-01-02 NOTE — Telephone Encounter (Signed)
Pt calling to see if it was ok to take Tylenol along with recently prescribed medication, tessalon and albuterol. Advised pt that is was ok to take tylenol for complaints of headache. Pt states she has 500mg  tablets of tylenol. Pt advised to take one 500mg  tablet to she if it would help with headache and if not she could take a second tablet within an hour of the first one. Explained to pt if two taken tow wait approximately 8 hours in between taking another dose, but to also read the back of the bottle for instructions on how to take the medication as well. Pt verbalized understanding.

## 2018-01-02 NOTE — Telephone Encounter (Signed)
Pt reports increased cough and shortness of breath since last night. Seen in office 12/26/17 for cough and reports finished Z-pack Tuesday. States "feel worse." Unsure of fever, "Runny nose."  States cough productive for "yellow" mucous at times.  Reports increased SOB since last night, moderate, at rest but able to do ADLs. Denies dizziness. States "wheezing with coughing."  Appt made with E. Saguier, PAC for today. Pt needed an evening appt.  Instructed to call back if symptoms worsen until appt. Care advise given. Reason for Disposition . Wheezing is present  Answer Assessment - Initial Assessment Questions 1. RESPIRATORY STATUS: "Describe your breathing?" (e.g., wheezing, shortness of breath, unable to speak, severe coughing)      Short of breath 2. ONSET: "When did this breathing problem begin?"     Last night 3. PATTERN "Does the difficult breathing come and go, or has it been constant since it started?"      constant 4. SEVERITY: "How bad is your breathing?" (e.g., mild, moderate, severe)    - MILD: No SOB at rest, mild SOB with walking, speaks normally in sentences, can lay down, no retractions, pulse < 100.    - MODERATE: SOB at rest, SOB with minimal exertion and prefers to sit, cannot lie down flat, speaks in phrases, mild retractions, audible wheezing, pulse 100-120.    - SEVERE: Very SOB at rest, speaks in single words, struggling to breathe, sitting hunched forward, retractions, pulse > 120      Moderate 5. RECURRENT SYMPTOM: "Have you had difficulty breathing before?" If so, ask: "When was the last time?" and "What happened that time?"      No 6. CARDIAC HISTORY: "Do you have any history of heart disease?" (e.g., heart attack, angina, bypass surgery, angioplasty)      Pacer 7. LUNG HISTORY: "Do you have any history of lung disease?"  (e.g., pulmonary embolus, asthma, emphysema)     No 8. CAUSE: "What do you think is causing the breathing problem?"      Unsure 9. OTHER SYMPTOMS:  "Do you have any other symptoms? (e.g., dizziness, runny nose, cough, chest pain, fever)  Cough, productive at times, yellowish, runny nose       11. TRAVEL: "Have you traveled out of the country in the last month?" (e.g., travel history, exposures)       no  Answer Assessment - Initial Assessment Questions 1. ONSET: "When did the cough begin?"     Several weeks ago 2. SEVERITY: "How bad is the cough today?"      Moderate 3. RESPIRATORY DISTRESS: "Describe your breathing."      "Wheezing when coughing" 4. FEVER: "Do you have a fever?" If so, ask: "What is your temperature, how was it measured, and when did it start?"     Unsure 5. SPUTUM: "Describe the color of your sputum" (clear, white, yellow, green)     Yellowish 6. HEMOPTYSIS: "Are you coughing up any blood?" If so ask: "How much?" (flecks, streaks, tablespoons, etc.)     No 7. CARDIAC HISTORY: "Do you have any history of heart disease?" (e.g., heart attack, congestive heart failure)      Pacer 8. LUNG HISTORY: "Do you have any history of lung disease?"  (e.g., pulmonary embolus, asthma, emphysema)     no 9. PE RISK FACTORS: "Do you have a history of blood clots?" (or: recent major surgery, recent prolonged travel, bedridden )     no 10. OTHER SYMPTOMS: "Do you have any other symptoms?" (e.g.,  runny nose, wheezing, chest pain)       SOB since last night,"wheezing when coughing"  12. TRAVEL: "Have you traveled out of the country in the last month?" (e.g., travel history, exposures)      no  Protocols used: COUGH - ACUTE PRODUCTIVE-A-AH, BREATHING DIFFICULTY-A-AH

## 2018-01-03 ENCOUNTER — Ambulatory Visit: Payer: Self-pay | Admitting: *Deleted

## 2018-01-03 ENCOUNTER — Ambulatory Visit: Payer: Self-pay

## 2018-01-03 ENCOUNTER — Emergency Department (HOSPITAL_COMMUNITY)
Admission: EM | Admit: 2018-01-03 | Discharge: 2018-01-03 | Disposition: A | Discharge status: Home / Self Care | Payer: Medicare HMO | Attending: Emergency Medicine | Admitting: Emergency Medicine

## 2018-01-03 ENCOUNTER — Encounter (HOSPITAL_COMMUNITY): Payer: Self-pay

## 2018-01-03 DIAGNOSIS — I5022 Chronic systolic (congestive) heart failure: Secondary | ICD-10-CM | POA: Diagnosis not present

## 2018-01-03 DIAGNOSIS — Z79899 Other long term (current) drug therapy: Secondary | ICD-10-CM | POA: Diagnosis not present

## 2018-01-03 DIAGNOSIS — Z87891 Personal history of nicotine dependence: Secondary | ICD-10-CM | POA: Diagnosis not present

## 2018-01-03 DIAGNOSIS — G4489 Other headache syndrome: Secondary | ICD-10-CM | POA: Diagnosis not present

## 2018-01-03 DIAGNOSIS — R069 Unspecified abnormalities of breathing: Secondary | ICD-10-CM | POA: Diagnosis not present

## 2018-01-03 DIAGNOSIS — Z7982 Long term (current) use of aspirin: Secondary | ICD-10-CM | POA: Diagnosis not present

## 2018-01-03 DIAGNOSIS — R519 Headache, unspecified: Secondary | ICD-10-CM

## 2018-01-03 DIAGNOSIS — J441 Chronic obstructive pulmonary disease with (acute) exacerbation: Secondary | ICD-10-CM

## 2018-01-03 DIAGNOSIS — R05 Cough: Secondary | ICD-10-CM | POA: Diagnosis not present

## 2018-01-03 DIAGNOSIS — R51 Headache: Secondary | ICD-10-CM | POA: Diagnosis not present

## 2018-01-03 DIAGNOSIS — Z950 Presence of cardiac pacemaker: Secondary | ICD-10-CM | POA: Diagnosis not present

## 2018-01-03 MED ORDER — ALBUTEROL SULFATE HFA 108 (90 BASE) MCG/ACT IN AERS
2.0000 | INHALATION_SPRAY | Freq: Once | RESPIRATORY_TRACT | Status: AC
Start: 2018-01-03 — End: 2018-01-03
  Administered 2018-01-03: 2 via RESPIRATORY_TRACT
  Filled 2018-01-03: qty 6.7

## 2018-01-03 MED ORDER — PREDNISONE 20 MG PO TABS: 40.0000 mg | ORAL_TABLET | Freq: Every day | ORAL | 0 refills | Status: DC

## 2018-01-03 MED ORDER — PREDNISONE 20 MG PO TABS
40.0000 mg | ORAL_TABLET | Freq: Once | ORAL | Status: AC
  Administered 2018-01-03: 40 mg via ORAL
  Filled 2018-01-03: qty 2

## 2018-01-03 MED ORDER — IPRATROPIUM-ALBUTEROL 0.5-2.5 (3) MG/3ML IN SOLN
3.0000 mL | Freq: Once | RESPIRATORY_TRACT | Status: AC
  Administered 2018-01-03: 3 mL via RESPIRATORY_TRACT
  Filled 2018-01-03: qty 3

## 2018-01-03 MED ORDER — AEROCHAMBER PLUS FLO-VU MEDIUM MISC
1.0000 | Freq: Once | Status: AC
  Administered 2018-01-03: 1
  Filled 2018-01-03: qty 1

## 2018-01-03 NOTE — ED Provider Notes (Signed)
[facility] DEPT Provider Note   CSN: [id number] Arrival date & time: 01/03/18  1325     History   Chief Complaint Chief Complaint  Patient presents with  . Headache  . Cough    HPI Tracey Holt is a 84 y.o. female.  84 year old female with past medical history including sarcoidosis, cardiomyopathy, CHF who presents with cough.  The patient has had over 2 weeks of cough.  She has been following with her PCP for this.  She was initially given a course of azithromycin which did not improve her symptoms.  She was evaluated again yesterday and was prescribed Tessalon and an albuterol inhaler to use as needed.  She tested negative for influenza.  Today she complains that she had a mild headache earlier that seemed to get worse after she took Tessalon and she thinks that the medication is the culprit.  She had Tylenol earlier with no relief.  Currently she states that her headache has eased and is currently mild.  She continues to have cough and some wheezing.  Son states that she does not have a formal diagnosis of COPD but does have smoking history.  She has had no fevers, vomiting, diarrhea, or any weakness.   The history is provided by the patient and a relative.  Headache    Cough  Associated symptoms include headaches.    Past Medical History:  Diagnosis Date  . Bradycardic cardiac arrest ([place]) 07/24/2016   Required CPR, intubation with multiple rib fractures. Had short term cardiac shock with acute heart failure.  Resulted in possible stress-induced cardiomyopathy  . Cardiac related syncope 07/24/2016   Bradycardic arrest requiring CPR 2 with transvenous pacemaker placed followed by permanent pacemaker; complicated by respiratory arrest, type II MI, stress-induced cardiopathy  . Cardiomyopathy ([place]) 07/2016   Moderate severely reduced EF with apical hypokinesis suggestive of stress-induced cardiac myopathy versus multivessel CAD --> in setting of  her to cardiac arrest  . COPD (chronic obstructive pulmonary disease) ([place])    By report  . History of chronic constipation   . Hypercholesterolemia   . Osteoporosis   . Pacemaker 07/26/2016   Abbott dual-chamber pacemaker: [place] #[id number] - Serial [id number]  . Pneumonia 08/2016  . Sarcoidosis of lung ([place])   . Spinal stenosis     Patient Active Problem List   Diagnosis Date Noted  . CHB (complete heart block) ([place]) 12/20/2016  . Chronic systolic congestive heart failure ([place]) 12/20/2016  . Angioedema 12/17/2016  . Chronic rhinitis 12/17/2016  . Dyspnea 11/09/2016  . Upper airway cough syndrome 10/29/2016  . Abnormal CT of the chest  c/w lipoid pna 10/29/2016  . Toe ulcer, right ([place]) 10/17/2016  . Community acquired pneumonia of left lower lobe of lung ([place]) 09/19/2016  . S/P placement of cardiac pacemaker 09/19/2016  . History of syncope 09/19/2016  . Congestive dilated cardiomyopathy ([place]) 07/24/2016  . Bradycardic cardiac arrest ([place]) 07/24/2016    Past Surgical History:  Procedure Laterality Date  . ADENOIDECTOMY    . PACEMAKER INSERTION Left 07/26/2016   Abbott Assurity Model #[id number] - Serial #[id number] -- complicated by pneumothorax requiring chest tube placement  . TEE WITHOUT CARDIOVERSION  02/2013   EF 55-60% with normal global function. No thrombus, mass or vegetation. Trivial-small pericardial effusion. Moderate LA dilation. Mild MR.  . TONSILLECTOMY    . TRANSTHORACIC ECHOCARDIOGRAM  07/24/2016   [facility]: EF 45-50%. Mid and apical inferior, inferolateral and apical hypokinesis. GR 2  DD. Mild LA and moderate RA dilation. Mild paracardial effusion. Moderate to severe TR. Basal and mid segment hypokinesis with apical hypokinesis. - Suspect stress-induced cardiopathy versus multivessel CAD.  [facility] TRANSTHORACIC ECHOCARDIOGRAM  07/26/2016   [facility]: LIMITED (post PPM) - although the report suggested EF  30-35% with apical akinesis, rounding note suggested this was an improvement    OB History    No data available       Home Medications    Prior to Admission medications   Medication Sig Start Date End Date Taking? Authorizing Provider  albuterol (PROVENTIL HFA;VENTOLIN HFA) 108 (90 Base) MCG/ACT inhaler Inhale 2 puffs into the lungs every 6 (six) hours as needed for wheezing or shortness of breath. 01/02/18  Yes Saguier, Percell Miller, PA-C  Ascorbic Acid (VITAMIN C) 1000 MG tablet Take 1,000 mg by mouth daily.   Yes [provider]  aspirin EC 81 MG tablet Take 81 mg by mouth daily.   Yes [provider]  atorvastatin (LIPITOR) 10 MG tablet Take 1/2 tablet (5mg ) by mouth daily 03/08/17  Yes Copland, Gay Filler, MD  benzonatate (TESSALON) 100 MG capsule Take 1 capsule (100 mg total) by mouth 3 (three) times daily as needed for cough. 01/02/18  Yes Saguier, Percell Miller, PA-C  Biotin 10000 MCG TABS Take 1 capsule by mouth daily.   Yes [provider]  Calcium Citrate-Vitamin D (CITRACAL + D PO) Take 1 tablet by mouth 2 (two) times daily.   Yes [provider]  ciclopirox (PENLAC) 8 % solution Apply topically at bedtime. Apply over nail and surrounding skin. Apply daily over previous coat. After seven (7) days, may remove with alcohol and continue cycle. 12/04/17  Yes Saguier, Percell Miller, PA-C  docusate sodium (COLACE) 100 MG capsule Take 100 mg by mouth daily.   Yes [provider]  loratadine (CLARITIN) 10 MG tablet Take 10 mg by mouth daily.   Yes [provider]  LUTEIN PO Take 1 tablet by mouth daily.   Yes [provider]  Misc Natural Products (GLUCOS-CHONDROIT-MSM COMPLEX) TABS Take 2 tablets by mouth daily.   Yes [provider]  Multiple Vitamins-Minerals (MULTIVITAMIN WITH MINERALS) tablet Take 1 tablet by mouth daily.   Yes [provider]  Probiotic CAPS Take 1 capsule by mouth daily.   Yes [provider]    azelastine (ASTELIN) 0.1 % nasal spray Place 1 spray into both nostrils 2 (two) times daily. Use in each nostril as directed Patient not taking: Reported on 01/03/2018 12/04/17   Saguier, Percell Miller, PA-C  ketoconazole (NIZORAL) 2 % cream Apply 1 application topically daily. 12/04/17   Saguier, Percell Miller, PA-C  levocetirizine (XYZAL) 5 MG tablet Take 1 tablet (5 mg total) by mouth every evening. 12/20/17   Saguier, Percell Miller, PA-C  mupirocin ointment (BACTROBAN) 2 % Apply to tip of nose twice daily 12/04/17   Saguier, Percell Miller, PA-C  predniSONE (DELTASONE) 20 MG tablet Take 2 tablets (40 mg total) by mouth daily. 01/03/18   Little, Wenda Overland, MD  sulfamethoxazole-trimethoprim (BACTRIM) 400-80 MG tablet Take 1 tablet by mouth 2 (two) times daily. 01/02/18   Saguier, Percell Miller, PA-C    Family History Family History  Problem Relation Age of Onset  . Allergic rhinitis Neg Hx   . Angioedema Neg Hx   . Asthma Neg Hx   . Eczema Neg Hx   . Immunodeficiency Neg Hx   . Urticaria Neg Hx     Social History Social History   Tobacco  Use  . Smoking status: Former Smoker    Packs/day: 1.50    Years: 17.00    Pack years: 25.50    Types: Cigarettes    Start date: 12/18/1963    Last attempt to quit: 11/20/1983    Years since quitting: 34.1  . Smokeless tobacco: Never Used  Substance Use Topics  . Alcohol use: No  . Drug use: No     Allergies   Lidex [fluocinonide]; Levaquin [levofloxacin in d5w]; Penicillins; and Rifampin   Review of Systems Review of Systems  Respiratory: Positive for cough.   Neurological: Positive for headaches.   All other systems reviewed and are negative except that which was mentioned in HPI   Physical Exam Updated Vital Signs BP 134/77 (BP Location: Left Arm)   Pulse 73   Temp 97.9 F (36.6 C)   Resp 18   SpO2 100%   Physical Exam  Constitutional: She is oriented to person, place, and time. She appears well-developed and well-nourished. No distress.  HENT:  Head:  Normocephalic and atraumatic.  Moist mucous membranes  Eyes: Conjunctivae and EOM are normal. Pupils are equal, round, and reactive to light.  Neck: Neck supple.  Cardiovascular: Normal rate and regular rhythm.  Murmur heard. Pulmonary/Chest: Effort normal. She has wheezes.  insp and  expiratory wheezes bilaterally with normal work of breathing  Abdominal: Soft. Bowel sounds are normal. She exhibits no distension. There is no tenderness.  Musculoskeletal: She exhibits no edema.  Neurological: She is alert and oriented to person, place, and time. She has normal strength. No cranial nerve deficit or sensory deficit.  Fluent speech Some confusion during conversation and repetitive questioning  Skin: Skin is warm and dry.  Psychiatric: Judgment normal. Her mood appears anxious.  Nursing note and vitals reviewed.    ED Treatments / Results  Labs (all labs ordered are listed, but only abnormal results are displayed) Labs Reviewed - No data to display  EKG  EKG Interpretation None       Radiology Dg Chest 2 View  Result Date: 01/02/2018 CLINICAL DATA:  Cough and chest congestion for 1 month. EXAM: CHEST  2 VIEW COMPARISON:  PA and lateral chest 11/26/2016, 11/09/2016 and 10/04/2017. CT chest 10/07/2016. FINDINGS: The lungs appear emphysematous. No consolidative process, pneumothorax or effusion. Heart size is upper normal. Atherosclerosis noted. Multiple remote thoracic compression fractures are identified. Convex left scoliosis is also again seen IMPRESSION: Emphysema without acute disease. Atherosclerosis. Electronically Signed   By: Inge Rise M.D.   On: 01/02/2018 14:44    Procedures Procedures (including critical care time)  Medications Ordered in ED Medications  predniSONE (DELTASONE) tablet 40 mg (40 mg Oral Given 01/03/18 1451)  ipratropium-albuterol (DUONEB) 0.5-2.5 (3) MG/3ML nebulizer solution 3 mL (3 mLs Nebulization Given 01/03/18 1452)  AEROCHAMBER PLUS FLO-VU  MEDIUM MISC 1 each (1 each Other Given 01/03/18 1459)  albuterol (PROVENTIL HFA;VENTOLIN HFA) 108 (90 Base) MCG/ACT inhaler 2 puff (2 puffs Inhalation Given 01/03/18 1452)     Initial Impression / Assessment and Plan / ED Course  I have reviewed the triage vital signs and the nursing notes.  Pertinent labs & imaging results that were available during my care of the patient were reviewed by me and considered in my medical decision making (see chart for details).     PT anxious but well appearing on exam, VS stable.  She had diffuse wheezing on exam.  Her chest x-ray yesterday showed emphysema changes but no acute infiltrate.  Her  symptoms are very likely due to COPD exacerbation from viral URI and I feel that she is likely not improved because she has not yet had steroids or scheduled albuterol.  Gave DuoNeb here as well as albuterol with spacer to use at home.  Gave first dose of prednisone and discussed initiating prednisone burst.  She will follow-up with her PCP.  I discussed this plan with son who is in agreement and understands treatment plan as well as return precautions.  During her headache, and improved without intervention and son feels that it was likely related to anxiety.  She has a normal neurologic exam here.  Patient discharged in satisfactory condition.  Final Clinical Impressions(s) / ED Diagnoses   Final diagnoses:  COPD with acute exacerbation ([place])  Acute nonintractable headache, unspecified headache type    ED Discharge Orders        Ordered    predniSONE (DELTASONE) 20 MG tablet  Daily     01/03/18 1538       Little, Wenda Overland, MD 01/03/18 1747

## 2018-01-03 NOTE — Telephone Encounter (Signed)
Pt headed to ED.

## 2018-01-03 NOTE — ED Triage Notes (Addendum)
Patient arrived via [place] from [facility]. Patient c/o headache and cough. Patient has had non productive cough. Patient went to the doctor yesterday and was prescribed cough sepressant and albuterol inhaler. Patient has had a headache for 2 days. Patient states she was swabbed yesterday for the flu and it was negative. Patient has been taking tylenol that is no longer helping. Patient last took 500mg  tylenol at 12:00 today. Patient refused albuterol treatment and asked the EMS to take the mask off her face. Patient felt better without mask on. Patient has a pacemaker. Her son randy will be coming shortly. Hx. Of COPD, CHF, and ASCUD.

## 2018-01-03 NOTE — Telephone Encounter (Signed)
Pt called with complaints of headaches; she states that she has been taking tylenol but very time  she takes the benzonotate she gets a headache; pt agrees that she would like something else for cough because headaches are a side effect of benzonotate; pt seen in office 12/26/17 and 01/02/18; will route to [facility] for notifiction ofpt request. Pt can be reached at [phone].

## 2018-01-03 NOTE — Telephone Encounter (Signed)
Pt. called c/o a severe headache that stated 20 minutes ago.  Crying. Questioned pt. if she suffers from Migraine headaches; denied this.  Reported the headache is "severe"; admitted that it is the worst headache she has had. Questioned location of pain; stated "it is in the back of my head and the front."  Denied any blurred vision.  Pt. reported that the Benzonatate causes her to have headaches.  Advised to go to the ER due to the severity of her headache.  Requested to contact her son, and have him take her to the hospital.  Attempted to contact pt's. son; left voice message to call the office re: mother's symptoms.   Advised pt. unable to reach her son; called EMS for pt, with her verb. consent.  The 911 operator was conferenced-in with the pt.  Son called back to the office.  Advised of pt's complaints, and recommendation to be evaluated in the ER.  Son verb. Understanding; stated pt. Is calling him now.  Son ended call, to be able to call his mother.              Reason for Disposition . [1] SEVERE headache (e.g., excruciating) AND [2] "worst headache" of life  Answer Assessment - Initial Assessment Questions 1. LOCATION: "Where does it hurt?"      Back of head and front of head  2. ONSET: "When did the headache start?" (Minutes, hours or days)      Today, about 20 minutes ago  3. PATTERN: "Does the pain come and go, or has it been constant since it started?"     Constant ; started about 20 minutes ago. 4. SEVERITY: "How bad is the pain?" and "What does it keep you from doing?"  (e.g., Scale 1-10; mild, moderate, or severe)   - MILD (1-3): doesn't interfere with normal activities    - MODERATE (4-7): interferes with normal activities or awakens from sleep    - SEVERE (8-10): excruciating pain, unable to do any normal activities       "Severe" 5. RECURRENT SYMPTOM: "Have you ever had headaches before?" If so, ask: "When was the last time?" and "What happened that time?"      *No  Answer* 6. CAUSE: "What do you think is causing the headache?"     Thinks the Bensonatate is causing it.  7. MIGRAINE: "Have you been diagnosed with migraine headaches?" If so, ask: "Is this headache similar?"      Denies migraine headache hx.   8. HEAD INJURY: "Has there been any recent injury to the head?"      *No Answer* 9. OTHER SYMPTOMS: "Do you have any other symptoms?" (fever, stiff neck, eye pain, sore throat, cold symptoms)     Denied any vision changes 10. PREGNANCY: "Is there any chance you are pregnant?" "When was your last menstrual period?"      no  Protocols used: HEADACHE-A-AH

## 2018-01-03 NOTE — Telephone Encounter (Signed)
Pt calling to see if it was ok to take Tylenol and vitamins with medications recently prescribed to treat bronchitis. Pt states she had a headache on yesterday and took tylenol and she did experience some relief. Advised pt that she could take Tylenol to help with headache along with the other medications. Pt verbalized understanding.  Reason for Disposition . [1] Headache AND [2] has not taken pain medications  Protocols used: HEADACHE-A-AH

## 2018-01-03 NOTE — Discharge Instructions (Signed)
1. TAKE PREDNISONE (STEROID) ONCE DAILY UNTIL FINISHED. YOU MAY START TAKING PRESCRIPTION TOMORROW (Saturday). 2. USE ALBUTEROL 2 PUFFS EVERY 4 TO 6 HOURS FOR BREATHING. 3. STOP TAKING TESSALON. 4. YOU MAY TAKE 500MG  TO 1000MG  TYLENOL (ONE OR TWO EXTRA STRENGTH TYLENOL) EVERY 6 TO 8 HOURS AS NEEDED FOR HEADACHE  RETURN TO ER IF ANY WORSENING BREATHING PROBLEMS, FEVER, OR CONFUSION

## 2018-01-03 NOTE — ED Notes (Signed)
Bed: WA17 Expected date:  Expected time:  Means of arrival:  Comments: 84 yo HA, flu-like s/sx

## 2018-01-06 ENCOUNTER — Telehealth: Payer: Self-pay | Admitting: Medical

## 2018-01-06 ENCOUNTER — Ambulatory Visit: Payer: Medicare HMO | Admitting: Medical

## 2018-01-06 NOTE — Telephone Encounter (Signed)
Pt was seen in ER on 01/03/18.

## 2018-01-06 NOTE — Telephone Encounter (Signed)
Patient was seen in the emergency department on Friday.  She was given some prednisone.  Would you call patient and see how she is doing.

## 2018-01-07 ENCOUNTER — Ambulatory Visit: Payer: Self-pay

## 2018-01-07 ENCOUNTER — Telehealth: Payer: Self-pay | Admitting: Medical

## 2018-01-07 NOTE — Telephone Encounter (Signed)
Pt calling with 2 day h/o headaches. Pt states she has been taking Tylenol and getting some relief but that they keep coming back. Advised pt to be seen tomorrow for evaluation. Pt going to call daughter for transportation and then will call back for appt.  Reason for Disposition . [1] MODERATE headache (e.g., interferes with normal activities) AND [2] present > 24 hours AND [3] unexplained  (Exceptions: analgesics not tried, typical migraine, or headache part of viral illness)  Answer Assessment - Initial Assessment Questions 1. LOCATION: "Where does it hurt?"      Back and front of head 2. ONSET: "When did the headache start?" (Minutes, hours or days)      2 days 3. PATTERN: "Does the pain come and go, or has it been constant since it started?"     constant 4. SEVERITY: "How bad is the pain?" and "What does it keep you from doing?"  (e.g., Scale 1-10; mild, moderate, or severe)   - MILD (1-3): doesn't interfere with normal activities    - MODERATE (4-7): interferes with normal activities or awakens from sleep    - SEVERE (8-10): excruciating pain, unable to do any normal activities        moderate 5. RECURRENT SYMPTOM: "Have you ever had headaches before?" If so, ask: "When was the last time?" and "What happened that time?"      01/03/18 went to ED  6. CAUSE: "What do you think is causing the headache?"     Pt does not know 7. MIGRAINE: "Have you been diagnosed with migraine headaches?" If so, ask: "Is this headache similar?"      no 8. HEAD INJURY: "Has there been any recent injury to the head?"      no 9. OTHER SYMPTOMS: "Do you have any other symptoms?" (fever, stiff neck, eye pain, sore throat, cold symptoms)     no 10. PREGNANCY: "Is there any chance you are pregnant?" "When was your last menstrual period?"       n/a  Protocols used: HEADACHE-A-AH

## 2018-01-07 NOTE — Telephone Encounter (Signed)
Copied from [place]. Topic: Inquiry >> Jan 07, 2018  2:35 PM Conception Chancy, NT wrote: Patient is calling and requesting a call back from [facility] nurse specifically. She states she has had a headache for 2 days that she can not seem to get rid of. She has been taking tylenol every 6 hours. Please advise and contact patient.

## 2018-01-08 ENCOUNTER — Ambulatory Visit (HOSPITAL_BASED_OUTPATIENT_CLINIC_OR_DEPARTMENT_OTHER)
Admission: RE | Admit: 2018-01-08 | Discharge: 2018-01-08 | Disposition: A | Discharge status: Home / Self Care | Payer: Medicare HMO | Source: Ambulatory Visit | Attending: Medical | Admitting: Medical

## 2018-01-08 ENCOUNTER — Encounter: Payer: Self-pay | Admitting: Medical

## 2018-01-08 ENCOUNTER — Ambulatory Visit (INDEPENDENT_AMBULATORY_CARE_PROVIDER_SITE_OTHER): Payer: Medicare HMO | Admitting: Medical

## 2018-01-08 VITALS — BP 139/78 | HR 81 | Temp 97.7°F | Resp 16 | Ht <= 58 in | Wt 91.6 lb

## 2018-01-08 DIAGNOSIS — J449 Chronic obstructive pulmonary disease, unspecified: Secondary | ICD-10-CM

## 2018-01-08 DIAGNOSIS — R059 Cough, unspecified: Secondary | ICD-10-CM

## 2018-01-08 DIAGNOSIS — R05 Cough: Secondary | ICD-10-CM | POA: Insufficient documentation

## 2018-01-08 DIAGNOSIS — R5383 Other fatigue: Secondary | ICD-10-CM

## 2018-01-08 MED ORDER — AZELASTINE HCL 0.1 % NA SOLN: 1.0000 | Freq: Two times a day (BID) | NASAL | 0 refills | Status: DC

## 2018-01-08 MED ORDER — IPRATROPIUM BROMIDE HFA 17 MCG/ACT IN AERS: 2.0000 | INHALATION_SPRAY | Freq: Four times a day (QID) | RESPIRATORY_TRACT | 2 refills | Status: DC | PRN

## 2018-01-08 NOTE — Patient Instructions (Addendum)
Concern for bronchitis versus pneumonia.  Your lungs still sound rough on auscultation.  In addition you are now bringing up thick yellow mucus when you cough.  I want to repeat the chest x-ray and get CBC.  See if there is been any interval change on the x-ray which basically read just COPD at last time.  You have not responded to the prednisone that was given to you by the emergency department.  Though you still might have some component of COPD.  If you do have any wheezing would recommend that you use the albuterol.  Also have concern that you have some sinus pressure/possible infection.  This might be playing a role with your headaches.  Continue Tylenol for your headaches.  For nasal congestion can use Astelin.  You were recently told not to use benzonatate by the ED.  If your cough were to worsen then I might prescribe you low dose Robitussin with codeine.  Presently not prescribing this as it might cause excess sedation.  When x-rays are back we will decide on prescribing either azithromycin or having you pick up the Bactrim which I already sent to your pharmacy last week.  Follow-up in 7 days or as needed.  Note I did review x-rays and the lungs appear to show emphysema but no consolidation/pneumonia.  I did discuss  patient presentation/recent visit with Dr. Lorelei Pont who used to be her PCP and she did recommend Bactrim antibiotic.  This was the antibiotic I did recommend last week.  Will call patient and inform her to start Bactrim.  Also will send in prescription of Atrovent for her to start daily.  Since the x-ray again notes emphysema type changes.  Also she can use the albuterol as well.  She has a prescription already.  Wanted to prescribe Flovent but he keeps bringing up allergic reaction.

## 2018-01-08 NOTE — Progress Notes (Signed)
Subjective:    Patient ID: Tracey Holt, female    DOB: Sep 02, 1922, 84 y.o.   MRN: [id number]  HPI  Pt in states her cough is still persisting. Now she shows me thick yellow colored mucus which  she is coughing up. She does express that she feels that she should have taken the bactim that I wrote her last week.  The ED gave her prednisone and it did not help her symptoms. She states she got worse and stopped prednisone after 3 days.  She does note that day before she got sick that person sitting across from her at lunch had coughed all over her. That person next day had to go to the hospital the next day.     Review of Systems  Constitutional: Positive for fatigue. Negative for chills and fever.  HENT: Positive for congestion. Negative for facial swelling, mouth sores and nosebleeds.   Respiratory: Positive for cough. Negative for chest tightness, shortness of breath and wheezing.        No obvious overt wheezing.   Cardiovascular: Negative for chest pain and palpitations.  Musculoskeletal: Negative for back pain, myalgias and neck pain.  Skin: Negative for rash.  Neurological: Positive for headaches. Negative for dizziness, syncope, weakness and light-headedness.       Does responds to tylenol.  Hematological: Negative for adenopathy. Does not bruise/bleed easily.  Psychiatric/Behavioral: Negative for behavioral problems and confusion.    Past Medical History:  Diagnosis Date  . Bradycardic cardiac arrest ([place]) 07/24/2016   Required CPR, intubation with multiple rib fractures. Had short term cardiac shock with acute heart failure.  Resulted in possible stress-induced cardiomyopathy  . Cardiac related syncope 07/24/2016   Bradycardic arrest requiring CPR 2 with transvenous pacemaker placed followed by permanent pacemaker; complicated by respiratory arrest, type II MI, stress-induced cardiopathy  . Cardiomyopathy ([place]) 07/2016   Moderate severely reduced EF with apical hypokinesis  suggestive of stress-induced cardiac myopathy versus multivessel CAD --> in setting of her to cardiac arrest  . COPD (chronic obstructive pulmonary disease) ([place])    By report  . History of chronic constipation   . Hypercholesterolemia   . Osteoporosis   . Pacemaker 07/26/2016   Abbott dual-chamber pacemaker: [employer] #[id number] - Serial [id number]  . Pneumonia 08/2016  . Sarcoidosis of lung ([place])   . Spinal stenosis      Social History   Socioeconomic History  . Marital status: Widowed    Spouse name: Not on file  . Number of children: Not on file  . Years of education: Not on file  . Highest education level: Not on file  Social Needs  . Financial resource strain: Not on file  . Food insecurity - worry: Not on file  . Food insecurity - inability: Not on file  . Transportation needs - medical: Not on file  . Transportation needs - non-medical: Not on file  Occupational History  . Not on file  Tobacco Use  . Smoking status: Former Smoker    Packs/day: 1.50    Years: 17.00    Pack years: 25.50    Types: Cigarettes    Start date: 12/18/1963    Last attempt to quit: 11/20/1983    Years since quitting: 34.1  . Smokeless tobacco: Never Used  Substance and Sexual Activity  . Alcohol use: No  . Drug use: No  . Sexual activity: Not Currently  Other Topics Concern  . Not on file  Social History Narrative  Recently moved to [place] from [place] to live near her daughter.    Past Surgical History:  Procedure Laterality Date  . ADENOIDECTOMY    . PACEMAKER INSERTION Left 07/26/2016   Abbott Assurity Model #[id number] - Serial #[id number] -- complicated by pneumothorax requiring chest tube placement  . TEE WITHOUT CARDIOVERSION  02/2013   EF 55-60% with normal global function. No thrombus, mass or vegetation. Trivial-small pericardial effusion. Moderate LA dilation. Mild MR.  . TONSILLECTOMY    . TRANSTHORACIC ECHOCARDIOGRAM  07/24/2016   [facility] [place] [facility]: EF 45-50%. Mid and apical inferior, inferolateral and apical hypokinesis. GR 2 DD. Mild LA and moderate RA dilation. Mild paracardial effusion. Moderate to severe TR. Basal and mid segment hypokinesis with apical hypokinesis. - Suspect stress-induced cardiopathy versus multivessel CAD.  Marland Kitchen TRANSTHORACIC ECHOCARDIOGRAM  07/26/2016   [place]: LIMITED (post PPM) - although the report suggested EF 30-35% with apical akinesis, rounding note suggested this was an improvement    Family History  Problem Relation Age of Onset  . Allergic rhinitis Neg Hx   . Angioedema Neg Hx   . Asthma Neg Hx   . Eczema Neg Hx   . Immunodeficiency Neg Hx   . Urticaria Neg Hx     Allergies  Allergen Reactions  . Lidex [Fluocinonide] Swelling    Caused lip swelling  . Levaquin [Levofloxacin In D5w]     Couldn't breath   . Penicillins     Arms swelling   . Rifampin     Couldn't breath     Current Outpatient Medications on File Prior to Visit  Medication Sig Dispense Refill  . albuterol (PROVENTIL HFA;VENTOLIN HFA) 108 (90 Base) MCG/ACT inhaler Inhale 2 puffs into the lungs every 6 (six) hours as needed for wheezing or shortness of breath. 1 Inhaler 2  . Ascorbic Acid (VITAMIN C) 1000 MG tablet Take 1,000 mg by mouth daily.    Marland Kitchen aspirin EC 81 MG tablet Take 81 mg by mouth daily.    Marland Kitchen atorvastatin (LIPITOR) 10 MG tablet Take 1/2 tablet (5mg ) by mouth daily 45 tablet 3  . azelastine (ASTELIN) 0.1 % nasal spray Place 1 spray into both nostrils 2 (two) times daily. Use in each nostril as directed 30 mL 0  . benzonatate (TESSALON) 100 MG capsule Take 1 capsule (100 mg total) by mouth 3 (three) times daily as needed for cough. 30 capsule 0  . Biotin 10000 MCG TABS Take 1 capsule by mouth daily.    . Calcium Citrate-Vitamin D (CITRACAL + D PO) Take 1 tablet by mouth 2 (two) times daily.    . ciclopirox (PENLAC) 8 % solution Apply topically at bedtime. Apply over nail and  surrounding skin. Apply daily over previous coat. After seven (7) days, may remove with alcohol and continue cycle. 6.6 mL 0  . docusate sodium (COLACE) 100 MG capsule Take 100 mg by mouth daily.    Marland Kitchen ketoconazole (NIZORAL) 2 % cream Apply 1 application topically daily. 15 g 1  . levocetirizine (XYZAL) 5 MG tablet Take 1 tablet (5 mg total) by mouth every evening. 30 tablet 0  . loratadine (CLARITIN) 10 MG tablet Take 10 mg by mouth daily.    . LUTEIN PO Take 1 tablet by mouth daily.    . Misc Natural Products (GLUCOS-CHONDROIT-MSM COMPLEX) TABS Take 2 tablets by mouth daily.    . Multiple Vitamins-Minerals (MULTIVITAMIN WITH MINERALS) tablet Take 1 tablet by mouth daily.    Marland Kitchen  mupirocin ointment (BACTROBAN) 2 % Apply to tip of nose twice daily 22 g 0  . predniSONE (DELTASONE) 20 MG tablet Take 2 tablets (40 mg total) by mouth daily. 8 tablet 0  . Probiotic CAPS Take 1 capsule by mouth daily.    Marland Kitchen sulfamethoxazole-trimethoprim (BACTRIM) 400-80 MG tablet Take 1 tablet by mouth 2 (two) times daily. 14 tablet 0   No current facility-administered medications on file prior to visit.     BP 139/78   Pulse 81   Temp 97.7 F (36.5 C) (Oral)   Resp 16   Ht 4\' 10"  (1.473 m)   Wt 91 lb 9.6 oz (41.5 kg)   SpO2 97%   BMI 19.14 kg/m       Objective:   Physical Exam   General  Mental Status - Alert. General Appearance - Well groomed. Not in acute distress.  Skin Rashes- No Rashes.  HEENT Head- Normal. Ear Auditory Canal - Left- Normal. Right - Normal.Tympanic Membrane- Left- Normal. Right- Normal. Eye Sclera/Conjunctiva- Left- Normal. Right- Normal. Nose & Sinuses Nasal Mucosa- Left-  Boggy and Congested. Right-  Boggy and  Congested.Bilateral maxillary and frontal sinus pressure. Mouth & Throat Lips: Upper Lip- Normal: no dryness, cracking, pallor, cyanosis, or vesicular eruption. Lower Lip-Normal: no dryness, cracking, pallor, cyanosis or vesicular eruption. Buccal Mucosa-  Bilateral- No Aphthous ulcers. Oropharynx- No Discharge or Erythema. Tonsils: Characteristics- Bilateral- No Erythema or Congestion. Size/Enlargement- Bilateral- No enlargement. Discharge- bilateral-None.  Neck Neck- Supple. No Masses.   Chest and Lung Exam Auscultation: Breath Sounds:-Diffuse coarse breath sounds.  But no wheezing heard.  Even unlabored.  Cardiovascular Auscultation:Rythm- Regular, rate and rhythm. Murmurs & Other Heart Sounds:Ausculatation of the heart reveal- No Murmurs.  Lymphatic Head & Neck General Head & Neck Lymphatics: Bilateral: Description- No Localized lymphadenopathy.  Lower ext- no pedal edema. Negative homans sign.      Assessment & Plan:  Concern for bronchitis versus pneumonia.  Your lungs still sound rough on auscultation.  In addition you are now bringing up thick yellow mucus when you cough.  I want to repeat the chest x-ray and get CBC.  See if there is been any interval change on the x-ray which basically read just COPD at last time.  You have not responded to the prednisone that was given to you by the emergency department.  Though you still might have some component of COPD.  If you do have any wheezing would recommend that you use the albuterol.  Also have concern that you have some sinus pressure/possible infection.  This might be playing a role with your headaches.  Continue Tylenol for your headaches.  For nasal congestion can use Astelin.  You were recently told not to use benzonatate by the ED.  If your cough were to worsen then I might prescribe you low dose Robitussin with codeine.  Presently not prescribing this as it might cause excess sedation.  When x-rays are back we will decide on prescribing either azithromycin or having you pick up the Bactrim which I already sent to your pharmacy last week.  Note I did review x-rays and the lungs appear to show emphysema but no consolidation/pneumonia.  I did discuss  patient  presentation/recent visit with Dr. Lorelei Pont who used to be her PCP and she did recommend Bactrim antibiotic.  This was the antibiotic I did recommend last week.  Will call patient and inform her to start Bactrim.  Also will send in prescription of Atrovent for her to  start daily.  Since the x-ray again notes emphysema type changes.  Also she can use the albuterol as well.  She has a prescription already.  Follow-up in 7 days or as needed.

## 2018-01-08 NOTE — Telephone Encounter (Signed)
Pt has appointment today.  

## 2018-01-08 NOTE — Telephone Encounter (Signed)
Pt will be seen today.

## 2018-01-09 ENCOUNTER — Telehealth: Payer: Self-pay | Admitting: Medical

## 2018-01-09 ENCOUNTER — Ambulatory Visit: Payer: Self-pay | Admitting: *Deleted

## 2018-01-09 ENCOUNTER — Ambulatory Visit: Payer: Self-pay

## 2018-01-09 LAB — COMPREHENSIVE METABOLIC PANEL
ALT: 31 U/L (ref 0–35)
AST: 35 U/L (ref 0–37)
Albumin: 4.1 g/dL (ref 3.5–5.2)
Alkaline Phosphatase: 63 U/L (ref 39–117)
BILIRUBIN TOTAL: 0.7 mg/dL (ref 0.2–1.2)
BUN: 16 mg/dL (ref 6–23)
CALCIUM: 10.2 mg/dL (ref 8.4–10.5)
CHLORIDE: 96 meq/L (ref 96–112)
CO2: 35 meq/L — AB (ref 19–32)
Creatinine, Ser: 0.63 mg/dL (ref 0.40–1.20)
GFR: 93.58 mL/min (ref >60.00)
GLUCOSE: 85 mg/dL (ref 70–99)
Potassium: 5.1 mEq/L (ref 3.5–5.1)
SODIUM: 134 meq/L — AB (ref 135–145)
Total Protein: 6.9 g/dL (ref 6.0–8.3)

## 2018-01-09 LAB — CBC WITH DIFFERENTIAL/PLATELET
Basophils Absolute: 0.1 10*3/uL (ref 0.0–0.1)
Basophils Relative: 0.9 % (ref 0.0–3.0)
EOS ABS: 0.1 10*3/uL (ref 0.0–0.7)
Eosinophils Relative: 1.4 % (ref 0.0–5.0)
HCT: 42.3 % (ref 36.0–46.0)
Hemoglobin: 13.9 g/dL (ref 12.0–15.0)
LYMPHS ABS: 1.3 10*3/uL (ref 0.7–4.0)
LYMPHS PCT: 18 % (ref 12.0–46.0)
MCHC: 32.8 g/dL (ref 30.0–36.0)
MCV: 87.8 fl (ref 78.0–100.0)
MONO ABS: 0.7 10*3/uL (ref 0.1–1.0)
Monocytes Relative: 9.9 % (ref 3.0–12.0)
NEUTROS ABS: 5.1 10*3/uL (ref 1.4–7.7)
NEUTROS PCT: 69.8 % (ref 43.0–77.0)
PLATELETS: 238 10*3/uL (ref 150.0–400.0)
RBC: 4.82 Mil/uL (ref 3.87–5.11)
RDW: 14.3 % (ref 11.5–15.5)
WBC: 7.3 10*3/uL (ref 4.0–10.5)

## 2018-01-09 NOTE — Telephone Encounter (Addendum)
Pt called because she wants to know what is "all this medicine the [facility] has prescribed [facility] at [facility] and what is it for?" Also she states she wants to know if she has pna or not!!"; reviewed notes and  medications that were prescribed per Mackie Pai on 01/08/18: "Also have concern that you have some sinus pressure/possible infection. This might be playing a role with your headaches, and Note I did review x-rays and the lungs appear to show emphysema but no consolidation/pneumonia"; the medications discussed were: 1. If you do have any wheezing would recommend that you use the albuterol. (2 puffs every 6 hours prn wheezing, shortness of breath)  2. Continue Tylenol for your headaches. 3. For nasal congestion can use Astelin (1 spray in each nostril two (2) times daily) 4. You were recently told not to use benzonatate by the ED. 5. Atrovent for her to start daily. (2 puffs into lungs ever six (6 hours as needed for wheezing); order verified with Mel Almond per Dr Harvie Heck 6. Sulfamethoxazole-trimethoprim 400-80 mg tablet (1 tablet by mouth two (2) times daily)  Will also call [facility] to check on status of prescriptions and call the pt back.  Reason for Disposition . General information question, no triage required and triager able to answer question  Answer Assessment - Initial Assessment Questions 1. REASON FOR CALL or QUESTION: "What is your reason for calling today?" or "How can I best help you?" or "What question do you have that I can help answer?"   what is "all this medicine [facility] and what is it for?" Also she states she wants to know if she has pna or not!!";  Protocols used: INFORMATION ONLY CALL-A-AH

## 2018-01-09 NOTE — Telephone Encounter (Signed)
Copied from [place] [id number]. Topic: Quick Communication - See Telephone Encounter >> Jan 09, 2018  2:36 PM Robina Ade, Helene Kelp D wrote: CRM for notification. See Telephone encounter for: 01/09/18. Patient called and wants to know if she can take vitamins while taking all of her medication and also why she got cough medication prescribe to her. Please call patient back, thanks.

## 2018-01-09 NOTE — Telephone Encounter (Signed)
Spoke with Geni Bers, Pharmacist at [facility] and she states that the atrovent had to be ordered but has come in today, the septra was not picked up because the pt said she had to wait to hear from the MD; dosage and administration also verified;  Geni Bers states that everything will be ready for pick up within the hour (astelin, atrovent, & septra); Mel Almond from [facility] will also call and review medications with pt; the pt can be contacted at [phone].

## 2018-01-09 NOTE — Telephone Encounter (Signed)
The instruction regarding how to take Bactrim is on the prescription.  It is twice a day.  Also advised her she can take her multivitamin.  I did prescribe her benzonatate for cough.  The emergency department advise her to stop that and it is unclear why they told her this.  If her cough is a lot worse I did mention to patient the possibility of giving her very low dose of Robitussin with codeine.  I was considering just 2.5 mL's every 6 hours as needed for cough.  If she wants me to write that can send it to her pharmacy.  Would give a reduced dose to avoid potential side effects in light of her age and weight.  She should be advised that sedation is the most common side effect with narcotic-based cough syrup.  Please call patient and have her start the antibiotic.  I called her last night and no one picked up.  So I left a message on her phone.  Also would recommend that you try to call her son Anaia Frith.  He is a patient of mine.  Explained to above the advisement. Recently Aluel appears to be forgetful and I want to make sure she starts antibiotic.

## 2018-01-09 NOTE — Telephone Encounter (Signed)
Pt. Called to ask if she should take Flonase that she used to take "years ago." Told her it is not on her medication list, so she should not take it. Verbalizes understanding.

## 2018-01-09 NOTE — Telephone Encounter (Signed)
Please verify how and if the patient should take the septra. Patient was instructed to wait  On results before taking septra. Please advise.

## 2018-01-10 ENCOUNTER — Ambulatory Visit: Payer: Self-pay | Admitting: *Deleted

## 2018-01-10 ENCOUNTER — Telehealth: Payer: Self-pay | Admitting: Medical

## 2018-01-10 NOTE — Telephone Encounter (Signed)
I did talk with Tracey Holt this morning.  She was still reporting productive cough.  She was a little bit short of breath at times.  She had not gotten the Atrovent inhaler yet but states that she would get that.  I advised her to take albuterol 2 puffs every 6 hours and Atrovent 2 puffs every 6 hours as well.  I explained again to her that I want her to take the Bactrim I prescribed last week.  She did finally take a 1 dose last night.  Reviewed with her that she does not have any chest pain.  Explained to her that if she has any worse signs or symptoms over the weekend then be seen in the emergency department.  I did review my schedule this afternoon to see if there was any open appointment slot available but there was none.  I did get her scheduled to see me on Monday morning.  I later called her son tried to speak to directly.  No one picked up but I did leave a message trying to explain the above and give him an update on how his mom is doing.  Explained that if she were to worsen over the weekend then to go to the emergency department.

## 2018-01-10 NOTE — Telephone Encounter (Signed)
Pt called because she said that she is short of breath. She has her medication but has not picked up the other one from the pharmacy.  The note in the chart is stating:  Also will send in prescription of Atrovent for her to start daily.  Since the x-ray again notes emphysema type changes.  Also she can use the albuterol as well.  She has a prescription already. On the prescription it states to Sig: Inhale 2 puffs into the lungs every 6 (six) hours as needed for wheezing. So the patient wants to know if she should do it as needed or every 6 hours. Awaiting a call back. Does not want another appointment.  Reason for Disposition . [1] MODERATE longstanding difficulty breathing (e.g., speaks in phrases, SOB even at rest, pulse 100-120) AND [2] SAME as normal  Answer Assessment - Initial Assessment Questions 1. RESPIRATORY STATUS: "Describe your breathing?" (e.g., wheezing, shortness of breath, unable to speak, severe coughing)      Shortness of breath 2. ONSET: "When did this breathing problem begin?"      Last week 3. PATTERN "Does the difficult breathing come and go, or has it been constant since it started?"      constant 4. SEVERITY: "How bad is your breathing?" (e.g., mild, moderate, severe)    - MILD: No SOB at rest, mild SOB with walking, speaks normally in sentences, can lay down, no retractions, pulse < 100.    - MODERATE: SOB at rest, SOB with minimal exertion and prefers to sit, cannot lie down flat, speaks in phrases, mild retractions, audible wheezing, pulse 100-120.    - SEVERE: Very SOB at rest, speaks in single words, struggling to breathe, sitting hunched forward, retractions, pulse > 120      moderate 5. RECURRENT SYMPTOM: "Have you had difficulty breathing before?" If so, ask: "When was the last time?" and "What happened that time?"      Not sure 6. CARDIAC HISTORY: "Do you have any history of heart disease?" (e.g., heart attack, angina, bypass surgery, angioplasty)   pacemaker 7. LUNG HISTORY: "Do you have any history of lung disease?"  (e.g., pulmonary embolus, asthma, emphysema)     emphysema 8. CAUSE: "What do you think is causing the breathing problem?"      Not sure 9. OTHER SYMPTOMS: "Do you have any other symptoms? (e.g., dizziness, runny nose, cough, chest pain, fever)     no 10. PREGNANCY: "Is there any chance you are pregnant?" "When was your last menstrual period?"       no 11. TRAVEL: "Have you traveled out of the country in the last month?" (e.g., travel history, exposures)       no  Protocols used: BREATHING DIFFICULTY-A-AH

## 2018-01-10 NOTE — Telephone Encounter (Signed)
Pt also triaged regarding symptoms. See Triage encounter from 2/22.

## 2018-01-10 NOTE — Telephone Encounter (Signed)
Pt. Calling back stating that albuterol is now giving her a headache and would to speak with Mackie Pai.

## 2018-01-10 NOTE — Telephone Encounter (Signed)
Open to review.  

## 2018-01-13 ENCOUNTER — Encounter: Payer: Self-pay | Admitting: Medical

## 2018-01-13 ENCOUNTER — Ambulatory Visit (INDEPENDENT_AMBULATORY_CARE_PROVIDER_SITE_OTHER): Payer: Medicare HMO | Admitting: Medical

## 2018-01-13 VITALS — BP 121/70 | HR 90 | Temp 97.9°F | Resp 16 | Ht 59.0 in | Wt 92.8 lb

## 2018-01-13 DIAGNOSIS — R059 Cough, unspecified: Secondary | ICD-10-CM

## 2018-01-13 DIAGNOSIS — R05 Cough: Secondary | ICD-10-CM

## 2018-01-13 DIAGNOSIS — J40 Bronchitis, not specified as acute or chronic: Secondary | ICD-10-CM | POA: Diagnosis not present

## 2018-01-13 DIAGNOSIS — J019 Acute sinusitis, unspecified: Secondary | ICD-10-CM

## 2018-01-13 DIAGNOSIS — S46819A Strain of other muscles, fascia and tendons at shoulder and upper arm level, unspecified arm, initial encounter: Secondary | ICD-10-CM

## 2018-01-13 MED ORDER — SULFAMETHOXAZOLE-TRIMETHOPRIM 400-80 MG PO TABS: 1.0000 | ORAL_TABLET | Freq: Two times a day (BID) | ORAL | 0 refills | Status: DC

## 2018-01-13 NOTE — Progress Notes (Signed)
Subjective:    Patient ID: Tracey Holt, female    DOB: 1922-07-28, 84 y.o.   MRN: [id number]  HPI  Pt states she does feel little better overall. Still has some mild cough(less cough than before). She will bring up some mucus at night. Pt has some less sinus pressure than before. Pt feels little bit more energy/less fatigue.  He is not reporting fevers, chills, sweats or body aches.    Pt denies that she is wheezing at all.   Pt is taking bactrim. She just started the medication on Friday night.  Her sinus pain is less.   Pt has some trapezius pain/mild pain occipital insertion point. Son thinks she is tense and anxious(he sent me text message regarding this)  Review of Systems  Constitutional: Negative for chills, diaphoresis and fever.  HENT: Positive for congestion, sinus pressure and sinus pain. Negative for sore throat.   Respiratory: Positive for cough. Negative for choking, shortness of breath and wheezing.   Cardiovascular: Negative for chest pain and palpitations.  Gastrointestinal: Negative for abdominal pain, blood in stool and constipation.  Musculoskeletal: Negative for back pain.       Mild trapezius tenderness to palpation  Neurological: Positive for headaches. Negative for dizziness and weakness.       Mild intermittent headache.  Partially responsive to Tylenol.  Hematological: Negative for adenopathy. Does not bruise/bleed easily.  Psychiatric/Behavioral: Negative for behavioral problems and confusion.    Past Medical History:  Diagnosis Date  . Bradycardic cardiac arrest ([place]) 07/24/2016   Required CPR, intubation with multiple rib fractures. Had short term cardiac shock with acute heart failure.  Resulted in possible stress-induced cardiomyopathy  . Cardiac related syncope 07/24/2016   Bradycardic arrest requiring CPR 2 with transvenous pacemaker placed followed by permanent pacemaker; complicated by respiratory arrest, type II MI, stress-induced cardiopathy    . Cardiomyopathy ([place]) 07/2016   Moderate severely reduced EF with apical hypokinesis suggestive of stress-induced cardiac myopathy versus multivessel CAD --> in setting of her to cardiac arrest  . COPD (chronic obstructive pulmonary disease) ([place])    By report  . History of chronic constipation   . Hypercholesterolemia   . Osteoporosis   . Pacemaker 07/26/2016   Abbott dual-chamber pacemaker: [place] #[id number] - Serial [id number]  . Pneumonia 08/2016  . Sarcoidosis of lung ([place])   . Spinal stenosis      Social History   Socioeconomic History  . Marital status: Widowed    Spouse name: Not on file  . Number of children: Not on file  . Years of education: Not on file  . Highest education level: Not on file  Social Needs  . Financial resource strain: Not on file  . Food insecurity - worry: Not on file  . Food insecurity - inability: Not on file  . Transportation needs - medical: Not on file  . Transportation needs - non-medical: Not on file  Occupational History  . Not on file  Tobacco Use  . Smoking status: Former Smoker    Packs/day: 1.50    Years: 17.00    Pack years: 25.50    Types: Cigarettes    Start date: 12/18/1963    Last attempt to quit: 11/20/1983    Years since quitting: 34.1  . Smokeless tobacco: Never Used  Substance and Sexual Activity  . Alcohol use: No  . Drug use: No  . Sexual activity: Not Currently  Other Topics Concern  . Not on file  Social  History Narrative   Recently moved to [place] from [place] to live near her daughter.    Past Surgical History:  Procedure Laterality Date  . ADENOIDECTOMY    . PACEMAKER INSERTION Left 07/26/2016   Abbott Assurity Model #QZ00923 - Serial #[id number] -- complicated by pneumothorax requiring chest tube placement  . TEE WITHOUT CARDIOVERSION  02/2013   EF 55-60% with normal global function. No thrombus, mass or vegetation. Trivial-small pericardial effusion. Moderate LA dilation. Mild MR.   . TONSILLECTOMY    . TRANSTHORACIC ECHOCARDIOGRAM  07/24/2016   [facility]: EF 45-50%. Mid and apical inferior, inferolateral and apical hypokinesis. GR 2 DD. Mild LA and moderate RA dilation. Mild paracardial effusion. Moderate to severe TR. Basal and mid segment hypokinesis with apical hypokinesis. - Suspect stress-induced cardiopathy versus multivessel CAD.  Marland Kitchen TRANSTHORACIC ECHOCARDIOGRAM  07/26/2016   [place]: LIMITED (post PPM) - although the report suggested EF 30-35% with apical akinesis, rounding note suggested this was an improvement    Family History  Problem Relation Age of Onset  . Allergic rhinitis Neg Hx   . Angioedema Neg Hx   . Asthma Neg Hx   . Eczema Neg Hx   . Immunodeficiency Neg Hx   . Urticaria Neg Hx     Allergies  Allergen Reactions  . Lidex [Fluocinonide] Swelling    Caused lip swelling  . Levaquin [Levofloxacin In D5w]     Couldn't breath   . Penicillins     Arms swelling   . Rifampin     Couldn't breath     Current Outpatient Medications on File Prior to Visit  Medication Sig Dispense Refill  . albuterol (PROVENTIL HFA;VENTOLIN HFA) 108 (90 Base) MCG/ACT inhaler Inhale 2 puffs into the lungs every 6 (six) hours as needed for wheezing or shortness of breath. 1 Inhaler 2  . Ascorbic Acid (VITAMIN C) 1000 MG tablet Take 1,000 mg by mouth daily.    Marland Kitchen aspirin EC 81 MG tablet Take 81 mg by mouth daily.    Marland Kitchen atorvastatin (LIPITOR) 10 MG tablet Take 1/2 tablet (5mg ) by mouth daily 45 tablet 3  . azelastine (ASTELIN) 0.1 % nasal spray Place 1 spray into both nostrils 2 (two) times daily. Use in each nostril as directed 30 mL 0  . benzonatate (TESSALON) 100 MG capsule Take 1 capsule (100 mg total) by mouth 3 (three) times daily as needed for cough. 30 capsule 0  . Biotin 10000 MCG TABS Take 1 capsule by mouth daily.    . Calcium Citrate-Vitamin D (CITRACAL + D PO) Take 1 tablet by mouth 2 (two) times daily.     . ciclopirox (PENLAC) 8 % solution Apply topically at bedtime. Apply over nail and surrounding skin. Apply daily over previous coat. After seven (7) days, may remove with alcohol and continue cycle. 6.6 mL 0  . docusate sodium (COLACE) 100 MG capsule Take 100 mg by mouth daily.    Marland Kitchen ipratropium (ATROVENT HFA) 17 MCG/ACT inhaler Inhale 2 puffs into the lungs every 6 (six) hours as needed for wheezing. 1 Inhaler 2  . ketoconazole (NIZORAL) 2 % cream Apply 1 application topically daily. 15 g 1  . levocetirizine (XYZAL) 5 MG tablet Take 1 tablet (5 mg total) by mouth every evening. 30 tablet 0  . loratadine (CLARITIN) 10 MG tablet Take 10 mg by mouth daily.    . LUTEIN PO Take 1 tablet by mouth daily.    . Misc Natural  Products (GLUCOS-CHONDROIT-MSM COMPLEX) TABS Take 2 tablets by mouth daily.    . Multiple Vitamins-Minerals (MULTIVITAMIN WITH MINERALS) tablet Take 1 tablet by mouth daily.    . mupirocin ointment (BACTROBAN) 2 % Apply to tip of nose twice daily 22 g 0  . predniSONE (DELTASONE) 20 MG tablet Take 2 tablets (40 mg total) by mouth daily. 8 tablet 0  . Probiotic CAPS Take 1 capsule by mouth daily.    Marland Kitchen sulfamethoxazole-trimethoprim (BACTRIM) 400-80 MG tablet Take 1 tablet by mouth 2 (two) times daily. 14 tablet 0   No current facility-administered medications on file prior to visit.     BP 121/70   Pulse 90   Temp 97.9 F (36.6 C) (Oral)   Resp 16   Ht 4\' 11"  (1.499 m)   Wt 92 lb 12.8 oz (42.1 kg)   SpO2 96%   BMI 18.74 kg/m       Objective:   Physical Exam  General  Mental Status - Alert. General Appearance - Well groomed. Not in acute distress.  Overall appearance looks better than last time.  Skin Rashes- No Rashes.  HEENT Head- Normal. Ear Auditory Canal - Left- Normal. Right - Normal.Tympanic Membrane- Left- Normal. Right- Normal. Eye Sclera/Conjunctiva- Left- Normal. Right- Normal. Nose & Sinuses Nasal Mucosa- Left-  Boggy and Congested. Right-  Boggy and   Congested.Bilateral faint maxillary and faint frontal sinus pressure. Mouth & Throat Lips: Upper Lip- Normal: no dryness, cracking, pallor, cyanosis, or vesicular eruption. Lower Lip-Normal: no dryness, cracking, pallor, cyanosis or vesicular eruption. Buccal Mucosa- Bilateral- No Aphthous ulcers. Oropharynx- No Discharge or Erythema. Tonsils: Characteristics- Bilateral- No Erythema or Congestion. Size/Enlargement- Bilateral- No enlargement. Discharge- bilateral-None.  Neck Neck- Supple. No Masses.   Chest and Lung Exam Auscultation: Breath Sounds:-Clear even and unlabored.  No rough breath sounds heard today.  Cardiovascular Auscultation:Rythm- Regular, rate and rhythm. Murmurs & Other Heart Sounds:Ausculatation of the heart reveal- No Murmurs.  Lymphatic Head & Neck General Head & Neck Lymphatics: Bilateral: Description- No Localized lymphadenopathy.       Assessment & Plan:  You do appear to be getting better.  I think your sinuses are less tender and your lungs sound much clear today than  on the prior 2 visits.  I want you to continue the Bactrim antibiotic but I am sending another 3 days of Bactrim to your pharmacy.  You will take a full 10-day course of Bactrim.  For nasal congestion continue Astelin.  If you have any wheezing then I want you to use both albuterol and Atrovent as instructed.  For cough you can continue Robitussin cough syrup over-the-counter.  For mild headache and trapezius tightness, you can use low-dose ibuprofen 200-400 mg every 8 hours as needed.   Follow-up in 7-10 days for any lingering/persisting symptoms or as needed.

## 2018-01-13 NOTE — Patient Instructions (Addendum)
You do appear to be getting better.  I think your sinuses are less tender and your lungs sound much clear today than  on the prior 2 visits.  I want you to continue the Bactrim antibiotic but I am sending another 3 days of Bactrim to your pharmacy.  You will take a full 10-day course of Bactrim.  For nasal congestion continue Astelin.  If you have any wheezing then I want you to use both albuterol and Atrovent as instructed.  For cough you can continue Robitussin cough syrup over-the-counter.  For mild headache and trapezius tightness, you can use low-dose ibuprofen 200-400 mg every 8 hours as needed.  Follow-up in 7-10 days for any lingering/persisting symptoms or as needed.

## 2018-01-20 ENCOUNTER — Ambulatory Visit: Payer: Self-pay | Admitting: *Deleted

## 2018-01-20 NOTE — Telephone Encounter (Signed)
I saw this message after hours. Was informed pt had no bm recently early in morning. Only found out last night at 10 pm that she stated no bm for 5 days. This falls into potential obstruction category or severe fecal impaction. Please get someone to talk to me directly in the future in presentation such as this since no guarantee I will see this in epic when I have to address note, labs, my chart message and then get to patient calls.   So please call patient and advise she can try one bottle of magnesium citrate early in the morning(can get at pharmacy). If by noon tomorrow no BM then advise ED evaluation. I have half a day tomorrow and if she does not have BM by noon then work up in ED would be appropiate.  If you call pt tomorrow. Find out if she has abdomen pain, nauseau or vomiting. If that is the case then just advise go to ED rather than waiting.

## 2018-01-20 NOTE — Telephone Encounter (Signed)
Pt  States   She   Tried  To give herself  An  Enema   Tried  Suppository    And  nothing   Is   Working  Pt  Reports   abd   Is bloated   And  Is  Hard  Last  Bm  1.  5 days   Ago Pt  Thinks   She  Is  Impacted .  Pt 3  Way     conference   With   Maudie Mercury  Who  Spoke   With  [employer]   Pt  Advised  To  Either try otc   remidies   Or  Go to  Er  If  Symptoms  Symptoms  Worse   No  Nausea  No  Vomiting         Reason for Disposition . Mild constipation  Answer Assessment - Initial Assessment Questions 1. STOOL PATTERN OR FREQUENCY: "How often do you pass bowel movements (BMs)?"  (Normal range: tid to q 3 days)  "When was the last BM passed?"      2  Days    2. STRAINING: "Do you have to strain to have a BM?"       yes 3. RECTAL PAIN: "Does your rectum hurt when the stool comes out?" If so, ask: "Do you have hemorrhoids? How bad is the pain?"  (Scale 1-10; or mild, moderate, severe)      no 4. STOOL COMPOSITION: "Are the stools hard?"     yes 5. BLOOD ON STOOLS: "Has there been any blood on the toilet tissue or on the surface of the BM?" If so, ask: "When was the last time?"      no 6. CHRONIC CONSTIPATION: "Is this a new problem for you?"  If no, ask: How long have you had this problem?" (days, weeks, months)      yes7. CHANGES IN DIET: "Have there been any recent changes in your diet?"             no 8. MEDICATIONS: "Have you been taking any new medications?"      jUST    FINISHED  ANTI  BIOTIC    NEW   NOSE   SPRAY   9. LAXATIVES: "Have you been using any laxatives or enemas?"  If yes, ask "What, how often, and when was the last time?"     TRIED   ENEMA  DID  NOT  WORK    STOOL  SOFTNERS   PERI  COLACE    10. CAUSE: "What do you think is causing the constipation?"         POSSIBLE   MEDICATION   11. OTHER SYMPTOMS: "Do you have any other symptoms?" (e.g., abdominal pain, fever, vomiting)       ABD  BLOATED   NO  NAUSEA  OR  VOMITING   12. PREGNANCY: "Is there any chance you are  pregnant?" "When was your last menstrual period?"       N/A  Protocols used: CONSTIPATION-A-AH

## 2018-01-20 NOTE — Telephone Encounter (Signed)
FYI

## 2018-01-21 ENCOUNTER — Ambulatory Visit: Payer: Self-pay | Admitting: *Deleted

## 2018-01-21 NOTE — Telephone Encounter (Signed)
Triage  Encounter created  Today -  Called  Patient  Patient is  Doing  Much  Better     Had  Bm  Yesterday  After noon  After  Taking  Stool   softners     As   Well  As  Mag  Citrate    Pt seems  In no  Distress  At this  Time  And  Is  Upbeat  On the  Telephone  At this  Time

## 2018-01-21 NOTE — Telephone Encounter (Signed)
Please send to the right office.

## 2018-01-21 NOTE — Telephone Encounter (Signed)
Patient  Doing  Much  Better  Today   She  Had  A  Good  BM   Yesterday    And   Is  Not  Constipated  At this  Time   -  She  Denies  Any  Pain  Pt  Taking  Mag  Citrate  She  States   She    Sounds  In  No  Acute  Distress at this  Time    Reason for Disposition . Mild constipation  Answer Assessment - Initial Assessment Questions 1. STOOL PATTERN OR FREQUENCY: "How often do you pass bowel movements (BMs)?"  (Normal range: tid to q 3 days)  "When was the last BM passed?"      Had   Good  bm  This  Am      After  Taking  Peri  collace   2. STRAINING: "Do you have to strain to have a BM?"      no3. RECTAL PAIN: "Does your rectum hurt when the stool comes out?" If so, ask: "Do you have hemorrhoids? How bad is the pain?"  (Scale 1-10; or mild, moderate, severe)     no 4. STOOL COMPOSITION: "Are the stools hard?"        no 5. BLOOD ON STOOLS: "Has there been any blood on the toilet tissue or on the surface of the BM?" If so, ask: "When was the last time?"      no 6. CHRONIC CONSTIPATION: "Is this a new problem for you?"  If no, ask: How long have you had this problem?" (days, weeks, months)       Had  Chronic  Constipation in  Past   7. CHANGES IN DIET: "Have there been any recent changes in your diet?"      none 8. MEDICATIONS: "Have you been taking any new medications?"     Peri colace   Magnesium  Citrate    9. LAXATIVES: "Have you been using any laxatives or enemas?"  If yes, ask "What, how often, and when was the last time?"     Yes   10. CAUSE: "What do you think is causing the constipation?"        *No Answer* 11. OTHER SYMPTOMS: "Do you have any other symptoms?" (e.g., abdominal pain, fever, vomiting)       *No Answer* 12. PREGNANCY: "Is there any chance you are pregnant?" "When was your last menstrual period?"       *No Answer*  Protocols used: CONSTIPATION-A-AH

## 2018-01-21 NOTE — Telephone Encounter (Signed)
Thanks for the update

## 2018-02-04 ENCOUNTER — Telehealth: Payer: Self-pay | Admitting: *Deleted

## 2018-02-04 NOTE — Telephone Encounter (Signed)
Looks like Ronnie order claritin for pt or historical med she used in past and Ronnie made note. I prescribed xyzal. Advise pt to stop claritin. I had only intended her to use xyzal.  Thanks.

## 2018-02-04 NOTE — Telephone Encounter (Signed)
Received fax from [facility] that pt called because pharmacist told her she should not be taking claritin and Xyzal both together. Pt reports that she has taken both of these for a long time.  Please advise?

## 2018-02-05 NOTE — Telephone Encounter (Signed)
Left message for pt to return my call.

## 2018-02-10 ENCOUNTER — Telehealth: Payer: Self-pay | Admitting: Medical

## 2018-02-10 NOTE — Telephone Encounter (Signed)
Copied from [place] [id number]. Topic: Quick Communication - See Telephone Encounter >> Feb 10, 2018 11:09 AM Burnis Medin, NT wrote: CRM for notification. See Telephone encounter for: 02/10/18. Patient called and wanted to let the doctor know the reason she is taking levocetirizine (XYZAL) 5 MG tablet is because it helps her at night. Patient said she has day and night allergies and would like a call back.

## 2018-02-10 NOTE — Telephone Encounter (Signed)
Left detailed message on pt's voicemail and to call if any further questions.

## 2018-02-11 NOTE — Telephone Encounter (Signed)
I think I won't have adequate time to call patient to discuss use of xyzal.. Let her know xyzal works all day. But often recommended to take at night. She might sleep better if she takes at night.  What is her specific question regarding xyzal?  I can try to call between patients but often very busy/major challenge to call back.

## 2018-02-12 ENCOUNTER — Ambulatory Visit: Payer: Medicare HMO | Admitting: Family Medicine

## 2018-02-12 DIAGNOSIS — Z0289 Encounter for other administrative examinations: Secondary | ICD-10-CM

## 2018-02-12 DIAGNOSIS — J188 Other pneumonia, unspecified organism: Secondary | ICD-10-CM | POA: Diagnosis not present

## 2018-02-14 ENCOUNTER — Telehealth: Payer: Self-pay | Admitting: Medical

## 2018-02-14 NOTE — Telephone Encounter (Signed)
Patient states she was seen at [facility] and diagnosed with pneumonia on 3/27. Patient states she had a chest X-ray and she started a loading dose of a Z pak that day. Patient wants to make sure that this is the appropriate treatment and she also wants to know when to follow up. Told patient I would forward her questions and get answers for her.

## 2018-02-14 NOTE — Telephone Encounter (Unsigned)
Copied from [place]. Topic: Quick Communication - Rx Refill/Question >> Feb 14, 2018 12:59 PM Neva Seat wrote: Pt requesting another Z-pak be prescribed for her.  [id number] [place], [place] [address] [place] [address] Phone: [phone] Fax: [phone]

## 2018-02-17 ENCOUNTER — Ambulatory Visit (INDEPENDENT_AMBULATORY_CARE_PROVIDER_SITE_OTHER): Payer: Medicare HMO | Admitting: Medical

## 2018-02-17 ENCOUNTER — Ambulatory Visit (HOSPITAL_BASED_OUTPATIENT_CLINIC_OR_DEPARTMENT_OTHER)
Admission: RE | Admit: 2018-02-17 | Discharge: 2018-02-17 | Disposition: A | Discharge status: Home / Self Care | Payer: Medicare HMO | Source: Ambulatory Visit | Attending: Medical | Admitting: Medical

## 2018-02-17 ENCOUNTER — Telehealth: Payer: Self-pay | Admitting: Medical

## 2018-02-17 ENCOUNTER — Encounter: Payer: Self-pay | Admitting: Medical

## 2018-02-17 VITALS — BP 142/86 | HR 85 | Temp 98.2°F | Resp 16 | Ht 59.0 in | Wt 94.8 lb

## 2018-02-17 DIAGNOSIS — R05 Cough: Secondary | ICD-10-CM | POA: Diagnosis not present

## 2018-02-17 DIAGNOSIS — Z8701 Personal history of pneumonia (recurrent): Secondary | ICD-10-CM | POA: Diagnosis not present

## 2018-02-17 DIAGNOSIS — R059 Cough, unspecified: Secondary | ICD-10-CM

## 2018-02-17 DIAGNOSIS — J189 Pneumonia, unspecified organism: Secondary | ICD-10-CM | POA: Diagnosis not present

## 2018-02-17 NOTE — Progress Notes (Signed)
Subjective:    Patient ID: Tracey Holt, female    DOB: 1922/09/16, 84 y.o.   MRN: [id number]  HPI  Pt in for follow up. She went to urgent care last week. Pt state provider at ED told her she had pneumonia. She was placed on azithromycin. Pt states lower rib area hurt at time diagnosed with pneumonia.   Pain in lower rib area has stopped. None pain presently.  Pt went to [facility].  Pt just got otc tussin for cough.  Pt states she is still fatigued but not as fatigued before taking the antibiotic. No fever. No chills or sweats. She states never get fever in the past.  Pt just finished the zpack just yesterday.     Review of Systems  Constitutional: Positive for fatigue. Negative for chills and fever.  HENT: Negative for congestion, ear pain, sinus pressure and sinus pain.   Respiratory: Positive for cough. Negative for chest tightness, shortness of breath and wheezing.        Very minimal residual cough at this point.  Cardiovascular: Negative for chest pain and palpitations.  Gastrointestinal: Negative for abdominal pain.  Musculoskeletal: Negative for myalgias.  Skin: Negative for rash.  Neurological: Negative for dizziness, speech difficulty, weakness and headaches.  Hematological: Negative for adenopathy. Does not bruise/bleed easily.  Psychiatric/Behavioral: Negative for behavioral problems and confusion.    Past Medical History:  Diagnosis Date  . Bradycardic cardiac arrest ([place]) 07/24/2016   Required CPR, intubation with multiple rib fractures. Had short term cardiac shock with acute heart failure.  Resulted in possible stress-induced cardiomyopathy  . Cardiac related syncope 07/24/2016   Bradycardic arrest requiring CPR 2 with transvenous pacemaker placed followed by permanent pacemaker; complicated by respiratory arrest, type II MI, stress-induced cardiopathy  . Cardiomyopathy ([place]) 07/2016   Moderate severely reduced EF with apical hypokinesis suggestive of  stress-induced cardiac myopathy versus multivessel CAD --> in setting of her to cardiac arrest  . COPD (chronic obstructive pulmonary disease) ([place])    By report  . History of chronic constipation   . Hypercholesterolemia   . Osteoporosis   . Pacemaker 07/26/2016   Abbott dual-chamber pacemaker: [place] #[id number] - Serial [id number]  . Pneumonia 08/2016  . Sarcoidosis of lung ([place])   . Spinal stenosis      Social History   Socioeconomic History  . Marital status: Widowed    Spouse name: Not on file  . Number of children: Not on file  . Years of education: Not on file  . Highest education level: Not on file  Occupational History  . Not on file  Social Needs  . Financial resource strain: Not on file  . Food insecurity:    Worry: Not on file    Inability: Not on file  . Transportation needs:    Medical: Not on file    Non-medical: Not on file  Tobacco Use  . Smoking status: Former Smoker    Packs/day: 1.50    Years: 17.00    Pack years: 25.50    Types: Cigarettes    Start date: 12/18/1963    Last attempt to quit: 11/20/1983    Years since quitting: 34.2  . Smokeless tobacco: Never Used  Substance and Sexual Activity  . Alcohol use: No  . Drug use: No  . Sexual activity: Not Currently  Lifestyle  . Physical activity:    Days per week: Not on file    Minutes per session: Not on file  .  Stress: Not on file  Relationships  . Social connections:    Talks on phone: Not on file    Gets together: Not on file    Attends religious service: Not on file    Active member of club or organization: Not on file    Attends meetings of clubs or organizations: Not on file    Relationship status: Not on file  . Intimate partner violence:    Fear of current or ex partner: Not on file    Emotionally abused: Not on file    Physically abused: Not on file    Forced sexual activity: Not on file  Other Topics Concern  . Not on file  Social History Narrative   Recently moved  to [place] from [place] to live near her daughter.    Past Surgical History:  Procedure Laterality Date  . ADENOIDECTOMY    . PACEMAKER INSERTION Left 07/26/2016   Abbott Assurity Model #[id number] - Serial #[id number] -- complicated by pneumothorax requiring chest tube placement  . TEE WITHOUT CARDIOVERSION  02/2013   EF 55-60% with normal global function. No thrombus, mass or vegetation. Trivial-small pericardial effusion. Moderate LA dilation. Mild MR.  . TONSILLECTOMY    . TRANSTHORACIC ECHOCARDIOGRAM  07/24/2016   [facility]: EF 45-50%. Mid and apical inferior, inferolateral and apical hypokinesis. GR 2 DD. Mild LA and moderate RA dilation. Mild paracardial effusion. Moderate to severe TR. Basal and mid segment hypokinesis with apical hypokinesis. - Suspect stress-induced cardiopathy versus multivessel CAD.  Marland Kitchen TRANSTHORACIC ECHOCARDIOGRAM  07/26/2016   [place]: LIMITED (post PPM) - although the report suggested EF 30-35% with apical akinesis, rounding note suggested this was an improvement    Family History  Problem Relation Age of Onset  . Allergic rhinitis Neg Hx   . Angioedema Neg Hx   . Asthma Neg Hx   . Eczema Neg Hx   . Immunodeficiency Neg Hx   . Urticaria Neg Hx     Allergies  Allergen Reactions  . Lidex [Fluocinonide] Swelling    Caused lip swelling  . Levaquin [Levofloxacin In D5w]     Couldn't breath   . Penicillins     Arms swelling   . Rifampin     Couldn't breath     Current Outpatient Medications on File Prior to Visit  Medication Sig Dispense Refill  . albuterol (PROVENTIL HFA;VENTOLIN HFA) 108 (90 Base) MCG/ACT inhaler Inhale 2 puffs into the lungs every 6 (six) hours as needed for wheezing or shortness of breath. 1 Inhaler 2  . Ascorbic Acid (VITAMIN C) 1000 MG tablet Take 1,000 mg by mouth daily.    Marland Kitchen aspirin EC 81 MG tablet Take 81 mg by mouth daily.    Marland Kitchen atorvastatin (LIPITOR) 10 MG tablet  Take 1/2 tablet (5mg ) by mouth daily 45 tablet 3  . azelastine (ASTELIN) 0.1 % nasal spray Place 1 spray into both nostrils 2 (two) times daily. Use in each nostril as directed 30 mL 0  . azithromycin (ZITHROMAX) 250 MG tablet TAKE 2 TABLETS BY MOUTH TODAY, THEN TAKE 1 TABLET DAILY FOR 4 DAYS  0  . benzonatate (TESSALON) 100 MG capsule Take 1 capsule (100 mg total) by mouth 3 (three) times daily as needed for cough. 30 capsule 0  . Biotin 10000 MCG TABS Take 1 capsule by mouth daily.    . Calcium Citrate-Vitamin D (CITRACAL + D PO) Take 1 tablet by mouth 2 (two) times daily.    Marland Kitchen  ciclopirox (PENLAC) 8 % solution Apply topically at bedtime. Apply over nail and surrounding skin. Apply daily over previous coat. After seven (7) days, may remove with alcohol and continue cycle. 6.6 mL 0  . docusate sodium (COLACE) 100 MG capsule Take 100 mg by mouth daily.    Marland Kitchen ipratropium (ATROVENT HFA) 17 MCG/ACT inhaler Inhale 2 puffs into the lungs every 6 (six) hours as needed for wheezing. 1 Inhaler 2  . ketoconazole (NIZORAL) 2 % cream Apply 1 application topically daily. 15 g 1  . levocetirizine (XYZAL) 5 MG tablet Take 1 tablet (5 mg total) by mouth every evening. 30 tablet 0  . LUTEIN PO Take 1 tablet by mouth daily.    . Misc Natural Products (GLUCOS-CHONDROIT-MSM COMPLEX) TABS Take 2 tablets by mouth daily.    . Multiple Vitamins-Minerals (MULTIVITAMIN WITH MINERALS) tablet Take 1 tablet by mouth daily.    . mupirocin ointment (BACTROBAN) 2 % Apply to tip of nose twice daily 22 g 0  . Probiotic CAPS Take 1 capsule by mouth daily.     No current facility-administered medications on file prior to visit.     BP (!) 142/86   Pulse 85   Temp 98.2 F (36.8 C) (Oral)   Resp 16   Ht 4\' 11"  (1.499 m)   Wt 94 lb 12.8 oz (43 kg)   SpO2 96%   BMI 19.15 kg/m       Objective:   Physical Exam   General  Mental Status - Alert. General Appearance - Well groomed. Not in acute distress.  Skin Rashes- No  Rashes.  HEENT Head- Normal. Ear Auditory Canal - Left- Normal. Right - Normal.Tympanic Membrane- Left- Normal. Right- Normal. Eye Sclera/Conjunctiva- Left- Normal. Right- Normal. Nose & Sinuses Nasal Mucosa- Left-  Boggy and Congested. Right-  Boggy and  Congested.Bilateral no  maxillary and no  frontal sinus pressure. Mouth & Throat Lips: Upper Lip- Normal: no dryness, cracking, pallor, cyanosis, or vesicular eruption. Lower Lip-Normal: no dryness, cracking, pallor, cyanosis or vesicular eruption. Buccal Mucosa- Bilateral- No Aphthous ulcers. Oropharynx- No Discharge or Erythema. Tonsils: Characteristics- Bilateral- No Erythema or Congestion. Size/Enlargement- Bilateral- No enlargement. Discharge- bilateral-None.  Neck Neck- Supple. No Masses.   Chest and Lung Exam Auscultation: Breath Sounds:-Clear even and unlabored.  Cardiovascular Auscultation:Rythm- Regular, rate and rhythm. Murmurs & Other Heart Sounds:Ausculatation of the heart reveal- No Murmurs.  Lymphatic Head & Neck General Head & Neck Lymphatics: Bilateral: Description- No Localized lymphadenopathy.      Assessment & Plan:  For recent pneumonia will repeat cxr today to see if lungs look clear. You report some residual cough and fatigue. Do think it would be best to follow xray and see if any residual pneumonia seen. Also please sign release form so we can get records from [facility].  Presently recommend continue tussin for cough. Will follow xray and see if further antibiotic need.  Follow-up date to be determined after x-ray review.  Mackie Pai, PA-C

## 2018-02-17 NOTE — Patient Instructions (Addendum)
For recent pneumonia will repeat cxr today to see if lungs look clear. You report some residual cough and fatigue. Do think it would be best to follow xray and see if any residual pneumonia seen. Also please sign release form so we can get records from [facility].  Presently recommend continue tussin for cough. Will follow xray and see if further antibiotic need.  Follow-up date to be determined after x-ray review.

## 2018-02-17 NOTE — Telephone Encounter (Signed)
I sent result note message to [place] earlier today.  Not sure if she got that message.  Patient was waiting downstairs for her chest x-ray result.  I did not instruct her to wait but she currently wanted results quickly.  Late in the afternoon medical assistant left and was not sure if she talked with patient.  So I called and left a message advising patient that the x-ray was negative and did not show any pneumonia.

## 2018-02-18 ENCOUNTER — Telehealth: Payer: Self-pay | Admitting: Medical

## 2018-02-18 NOTE — Telephone Encounter (Signed)
Pt was seen in office yesterday.

## 2018-02-18 NOTE — Telephone Encounter (Signed)
Pt returned call to get results of her chest xray. Results given to her with verbal understanding.  She also understands that sometimes it takes post pneumonia energy takes a while to get back to normal.

## 2018-02-19 ENCOUNTER — Ambulatory Visit: Payer: Medicare HMO | Admitting: Medical

## 2018-02-20 ENCOUNTER — Telehealth: Payer: Self-pay | Admitting: Medical

## 2018-02-20 ENCOUNTER — Other Ambulatory Visit: Payer: Self-pay | Admitting: *Deleted

## 2018-02-20 MED ORDER — LEVOCETIRIZINE DIHYDROCHLORIDE 5 MG PO TABS: 5.0000 mg | ORAL_TABLET | Freq: Every evening | ORAL | 0 refills | Status: DC

## 2018-02-20 NOTE — Telephone Encounter (Signed)
Patient calling back and states this helps with night time allergies and needs med asap

## 2018-02-20 NOTE — Telephone Encounter (Signed)
Copied from [place] [id number]. Topic: General - Other >> Feb 20, 2018  9:36 AM Darl Householder, RMA wrote: Reason for CRM: Medication refill request for levocetirizine (XYZAL) 5 MG tablet to be sent to [facility]

## 2018-02-20 NOTE — Telephone Encounter (Signed)
Rx refilled per protocol- LOV: addressed 09/19/17- to continue

## 2018-02-26 DIAGNOSIS — D485 Neoplasm of uncertain behavior of skin: Secondary | ICD-10-CM | POA: Diagnosis not present

## 2018-03-05 DIAGNOSIS — J189 Pneumonia, unspecified organism: Secondary | ICD-10-CM | POA: Diagnosis not present

## 2018-03-05 DIAGNOSIS — R05 Cough: Secondary | ICD-10-CM | POA: Diagnosis not present

## 2018-03-12 ENCOUNTER — Ambulatory Visit: Payer: Self-pay | Admitting: *Deleted

## 2018-03-12 NOTE — Telephone Encounter (Signed)
Pt wanted to know if she can have an xray of her knee. She denies problem ambulating, and fever. She states her left hip is flat and has been that way for a while and wanted to know if this will affect her knee.  Home care advice given to patient with verbal understanding. Pt will call back if not able to walk, fever or any worsening symptoms. Appointment made per request of patient.  Reason for Disposition . Health Information question, no triage required and triager able to answer question  Answer Assessment - Initial Assessment Questions 1. REASON FOR CALL or QUESTION: "What is your reason for calling today?" or "How can I best help you?" or "What question do you have that I can help answer?"     Pt has a question about having an xray done for pain in her left knee.  Protocols used: INFORMATION ONLY CALL-A-AH

## 2018-03-13 ENCOUNTER — Telehealth: Payer: Self-pay | Admitting: Medical

## 2018-03-13 NOTE — Telephone Encounter (Signed)
Copied from [place]. Topic: Quick Communication - See Telephone Encounter >> Mar 13, 2018  5:34 PM Rutherford Nail, NT wrote: CRM for notification. See Telephone encounter for: 03/13/18. Patient is calling and wants to speak with someone about a question that she has. States that she does not have a hip on the left side. Wants to know what the doctor will do about it before her appointment on Friday 03/19/18. She is very addiment that someone give her a call to try and help her understand. Please advise. CB#: [phone]

## 2018-03-14 ENCOUNTER — Telehealth: Payer: Self-pay | Admitting: Medical

## 2018-03-14 ENCOUNTER — Ambulatory Visit (HOSPITAL_BASED_OUTPATIENT_CLINIC_OR_DEPARTMENT_OTHER)
Admission: RE | Admit: 2018-03-14 | Discharge: 2018-03-14 | Disposition: A | Discharge status: Home / Self Care | Payer: Medicare HMO | Source: Ambulatory Visit | Attending: Family Medicine | Admitting: Family Medicine

## 2018-03-14 ENCOUNTER — Encounter: Payer: Self-pay | Admitting: Family Medicine

## 2018-03-14 ENCOUNTER — Ambulatory Visit (INDEPENDENT_AMBULATORY_CARE_PROVIDER_SITE_OTHER): Payer: Medicare HMO | Admitting: Family Medicine

## 2018-03-14 VITALS — BP 120/82 | HR 76 | Temp 97.7°F | Ht 59.0 in | Wt 92.5 lb

## 2018-03-14 DIAGNOSIS — M25552 Pain in left hip: Secondary | ICD-10-CM | POA: Diagnosis not present

## 2018-03-14 DIAGNOSIS — M25562 Pain in left knee: Secondary | ICD-10-CM | POA: Diagnosis not present

## 2018-03-14 DIAGNOSIS — M25752 Osteophyte, left hip: Secondary | ICD-10-CM | POA: Diagnosis not present

## 2018-03-14 NOTE — Patient Instructions (Addendum)
Ice/cold pack over area for 10-15 min twice daily.  OK to take Tylenol 1000 mg (2 extra strength tabs) or 975 mg (3 regular strength tabs) every 6 hours as needed.  We will be in contact regarding your X-ray results.   Let us know if you need anything.

## 2018-03-14 NOTE — Progress Notes (Signed)
Pre visit review using our clinic review tool, if applicable. No additional management support is needed unless otherwise documented below in the visit note. 

## 2018-03-14 NOTE — Telephone Encounter (Signed)
Open to review.  

## 2018-03-14 NOTE — Progress Notes (Signed)
Musculoskeletal Exam  Patient: Tracey Holt DOB: Oct 07, 1922  DOS: 03/14/2018  SUBJECTIVE:  Chief Complaint:   Chief Complaint  Patient presents with  . Knee Pain    left    Tracey Holt is a 84 y.o.  female for evaluation and treatment of L knee pain. She is by herself in the exam room.  Onset:  2 weeks ago.  No inj or change in activity.  Location: L outer knee; also has hip pain that she is unable to allocate (points to iliac crest but does not state there is pain there) Associated symptoms: Difficulty walking Denies swelling or bruising The patient states that she has a hip condition that she cannot remember the name of, when she was told about it, or who told her about it.  She states that if her "hip is gone, her leg will fall off."  I specifically asked her about degeneration, osteoporosis, inflammation, arthritis, leg length discrepancy; she did not believe any of these were what she has had. Treatment: to date has been acetaminophen.   Neurovascular symptoms: no  ROS: Musculoskeletal/Extremities: +L hip and knee pain  Past Medical History:  Diagnosis Date  . Bradycardic cardiac arrest ([place]) 07/24/2016   Required CPR, intubation with multiple rib fractures. Had short term cardiac shock with acute heart failure.  Resulted in possible stress-induced cardiomyopathy  . Cardiac related syncope 07/24/2016   Bradycardic arrest requiring CPR 2 with transvenous pacemaker placed followed by permanent pacemaker; complicated by respiratory arrest, type II MI, stress-induced cardiopathy  . Cardiomyopathy ([place]) 07/2016   Moderate severely reduced EF with apical hypokinesis suggestive of stress-induced cardiac myopathy versus multivessel CAD --> in setting of her to cardiac arrest  . COPD (chronic obstructive pulmonary disease) ([place])    By report  . History of chronic constipation   . Hypercholesterolemia   . Osteoporosis   . Pacemaker 07/26/2016   Abbott dual-chamber pacemaker:  [place] #[id number] - Serial [id number]  . Pneumonia 08/2016  . Sarcoidosis of lung ([place])   . Spinal stenosis     Objective: VITAL SIGNS: BP 120/82 (BP Location: Left Arm, Patient Position: Sitting, Cuff Size: Normal)   Pulse 76   Temp 97.7 F (36.5 C) (Oral)   Ht 4\' 11"  (1.499 m)   Wt 92 lb 8 oz (42 kg)   SpO2 93%   BMI 18.68 kg/m  Constitutional: Well formed, well developed. No acute distress. Cardiovascular: Brisk cap refill Thorax & Lungs: No accessory muscle use Musculoskeletal: L knee.   Normal active range of motion: decreased flexion  Normal passive range of motion: decreased flexion Tenderness to palpation: no Deformity: no Ecchymosis: no Tests positive: Stine's elicited pain Tests negative: Lachman's, varus/valgus, patellar apprehension, McMurray's L hip- ttp over greater troch, no ttp over iliac crest, neg log roll, FADDIR, FABER, [place] Neurologic: Normal sensory function. Antalgic gait. Psychiatric: Normal mood. Age appropriate judgment and insight. Alert & oriented x 3.    Assessment:  Pain of left hip joint - Plan: DG HIP UNILAT W OR W/O PELVIS 2-3 VIEWS LEFT  Acute pain of left knee  Plan: Orders as above. Ck XR regarding hip issue. Hopefully I will be able to have some light shed on what she could be referring to.  I think she has a greater trochanteric bursitis and osteoarthritis of the left knee.  There could be acute inflammation due to her hip issue.  She does not agree with my assessment, however we did agree on the plan. Review  of previous records show osteoarthritis of the left knee.  She does not believe she has arthritis though. Ice. Cont Tylenol.  Offered stretches/exercises, injection and rx med, but she declined.  F/u prn. The patient voiced understanding and agreement to the above plan.  Greater than 25 minutes were spent face to face with the patient with greater than 50% of this time spent counseling on my diagnosis, imaging  results, and plan.    She did ask how long I been practicing after disagreeing with some of the x-ray results I found.  I would prefer not to see her in the future as I do not believe she has confidence in my medical decision making.  [place], DO 03/14/18  3:43 PM

## 2018-03-14 NOTE — Telephone Encounter (Signed)
Future x-ray order of knee placed.

## 2018-03-14 NOTE — Telephone Encounter (Signed)
Pt called this morning and is requesting an appointment with Percell Miller today, does not want to see anyone else.   Pt has an appointment with Dr Karl Luke 3:15 today, but does not want to see him. I advised pt I woul send a message. Pt states Percell Miller knows her and is the only one that can help her.  Please advise if pt can be worked in.

## 2018-03-17 ENCOUNTER — Other Ambulatory Visit: Payer: Self-pay | Admitting: Medical

## 2018-03-18 DIAGNOSIS — M25572 Pain in left ankle and joints of left foot: Secondary | ICD-10-CM | POA: Diagnosis not present

## 2018-03-18 DIAGNOSIS — M25562 Pain in left knee: Secondary | ICD-10-CM | POA: Diagnosis not present

## 2018-03-19 ENCOUNTER — Telehealth: Payer: Self-pay | Admitting: Medical

## 2018-03-19 ENCOUNTER — Ambulatory Visit (INDEPENDENT_AMBULATORY_CARE_PROVIDER_SITE_OTHER): Payer: Medicare HMO | Admitting: *Deleted

## 2018-03-19 ENCOUNTER — Telehealth: Payer: Self-pay

## 2018-03-19 ENCOUNTER — Ambulatory Visit: Payer: Medicare HMO | Admitting: Medical

## 2018-03-19 DIAGNOSIS — I442 Atrioventricular block, complete: Secondary | ICD-10-CM

## 2018-03-19 DIAGNOSIS — L989 Disorder of the skin and subcutaneous tissue, unspecified: Secondary | ICD-10-CM

## 2018-03-19 NOTE — Progress Notes (Signed)
Remote pacemaker transmission.   

## 2018-03-19 NOTE — Telephone Encounter (Signed)
Copied from [place] [id number]. Topic: Referral - Request >> Mar 19, 2018 11:11 AM Aurelio Brash B wrote: Reason for CRM:  PT is requesting a referral  to [facility]  dermatology  for Recurrent mild skin lesion on tip of nose.  She  is requesting a female Dr.

## 2018-03-19 NOTE — Telephone Encounter (Signed)
New referral placed at pt request.

## 2018-03-19 NOTE — Telephone Encounter (Signed)
Refer to derm placed. Pt wants female dermatologist. REcently saw one. So send to new practice.

## 2018-03-20 ENCOUNTER — Encounter: Payer: Self-pay | Admitting: Cardiology

## 2018-03-24 ENCOUNTER — Other Ambulatory Visit: Payer: Self-pay | Admitting: Medical

## 2018-03-24 NOTE — Telephone Encounter (Signed)
Copied from [place] [id number]. Topic: Quick Communication - Rx Refill/Question >> Mar 24, 2018  8:29 AM Aurelio Brash B wrote:  Pt wants to  pick up today if possible  Medication: levocetirizine (XYZAL) 5 MG tablet   Has the patient contacted their pharmacy? No   (Agent: If no, request that the patient contact the pharmacy for the refill.)  Preferred Pharmacy (with phone number or street name):[id number] [address] [place] [phone] (Phone) [phone] (Fax)    Agent: Please be advised that RX refills may take up to 3 business days. We ask that you follow-up with your pharmacy.

## 2018-03-24 NOTE — Telephone Encounter (Signed)
Copied from [place] [id number]. Topic: Inquiry >> Mar 24, 2018  8:56 AM Pricilla Handler wrote: Reason for CRM: Patient called requesting to speak with Gillis Santa assistant. Please call patient back this morning.Marland KitchenMarland KitchenMarland Kitchen

## 2018-03-31 ENCOUNTER — Encounter (HOSPITAL_BASED_OUTPATIENT_CLINIC_OR_DEPARTMENT_OTHER): Payer: Self-pay

## 2018-03-31 ENCOUNTER — Other Ambulatory Visit: Payer: Self-pay

## 2018-03-31 ENCOUNTER — Emergency Department (HOSPITAL_BASED_OUTPATIENT_CLINIC_OR_DEPARTMENT_OTHER): Payer: Medicare HMO

## 2018-03-31 ENCOUNTER — Emergency Department (HOSPITAL_BASED_OUTPATIENT_CLINIC_OR_DEPARTMENT_OTHER)
Admission: EM | Admit: 2018-03-31 | Discharge: 2018-03-31 | Disposition: A | Discharge status: Home / Self Care | Payer: Medicare HMO | Attending: Emergency Medicine | Admitting: Emergency Medicine

## 2018-03-31 DIAGNOSIS — I5022 Chronic systolic (congestive) heart failure: Secondary | ICD-10-CM | POA: Diagnosis not present

## 2018-03-31 DIAGNOSIS — Z7982 Long term (current) use of aspirin: Secondary | ICD-10-CM | POA: Diagnosis not present

## 2018-03-31 DIAGNOSIS — Z79899 Other long term (current) drug therapy: Secondary | ICD-10-CM | POA: Insufficient documentation

## 2018-03-31 DIAGNOSIS — R51 Headache: Secondary | ICD-10-CM | POA: Insufficient documentation

## 2018-03-31 DIAGNOSIS — Z950 Presence of cardiac pacemaker: Secondary | ICD-10-CM | POA: Diagnosis not present

## 2018-03-31 DIAGNOSIS — Z87891 Personal history of nicotine dependence: Secondary | ICD-10-CM | POA: Diagnosis not present

## 2018-03-31 DIAGNOSIS — E860 Dehydration: Secondary | ICD-10-CM

## 2018-03-31 DIAGNOSIS — R519 Headache, unspecified: Secondary | ICD-10-CM

## 2018-03-31 DIAGNOSIS — J449 Chronic obstructive pulmonary disease, unspecified: Secondary | ICD-10-CM | POA: Insufficient documentation

## 2018-03-31 NOTE — ED Notes (Signed)
Pt returned from CT °

## 2018-03-31 NOTE — ED Notes (Signed)
Patient transported to CT 

## 2018-03-31 NOTE — ED Triage Notes (Signed)
C/o HA-points to entire head for pain site c/o-NAD-steady gait

## 2018-03-31 NOTE — Discharge Instructions (Addendum)
Drink AT LEAST 6-8 glasses of water daily  Take tylenol for pain

## 2018-03-31 NOTE — ED Provider Notes (Addendum)
[facility] EMERGENCY DEPARTMENT Provider Note   CSN: [id number] Arrival date & time: 03/31/18  2006     History   Chief Complaint Chief Complaint  Patient presents with  . Headache    HPI Tracey Holt is a 84 y.o. female.  HPI   84 yo F with PMHx cardiomyopathy, COPD, sarcoidosis, here with headache. Pt reportedly was outside throughout the day with family, was herself and doing well. She noticed that there was a 'grasshopper" on her forehead and she reportedly became very worried about it. She started to complain of a mild frontal headache where the grasshopper was shortly thereafter and has c/o headache since then. Pt has h/o anxiety and has been very anxious about it, reportedly concerned that she was poisoned by it. She was well and normal throughout her day prior to the incident. No fever, chills. HA is mild, generalized, frontal. No vision changes, photophobia, phonophobia. No neck pain. No recent falls. No specific alleviating or aggravating factors.  Past Medical History:  Diagnosis Date  . Bradycardic cardiac arrest ([place]) 07/24/2016   Required CPR, intubation with multiple rib fractures. Had short term cardiac shock with acute heart failure.  Resulted in possible stress-induced cardiomyopathy  . Cardiac related syncope 07/24/2016   Bradycardic arrest requiring CPR 2 with transvenous pacemaker placed followed by permanent pacemaker; complicated by respiratory arrest, type II MI, stress-induced cardiopathy  . Cardiomyopathy ([place]) 07/2016   Moderate severely reduced EF with apical hypokinesis suggestive of stress-induced cardiac myopathy versus multivessel CAD --> in setting of her to cardiac arrest  . COPD (chronic obstructive pulmonary disease) ([place])    By report  . History of chronic constipation   . Hypercholesterolemia   . Osteoporosis   . Pacemaker 07/26/2016   Abbott dual-chamber pacemaker: [place] #[id number] - Serial [id number]  . Pneumonia  08/2016  . Sarcoidosis of lung ([place])   . Spinal stenosis     Patient Active Problem List   Diagnosis Date Noted  . CHB (complete heart block) ([place]) 12/20/2016  . Chronic systolic congestive heart failure ([place]) 12/20/2016  . Angioedema 12/17/2016  . Chronic rhinitis 12/17/2016  . Dyspnea 11/09/2016  . Upper airway cough syndrome 10/29/2016  . Abnormal CT of the chest  c/w lipoid pna 10/29/2016  . Toe ulcer, right ([place]) 10/17/2016  . Community acquired pneumonia of left lower lobe of lung ([place]) 09/19/2016  . S/P placement of cardiac pacemaker 09/19/2016  . History of syncope 09/19/2016  . Congestive dilated cardiomyopathy ([place]) 07/24/2016  . Bradycardic cardiac arrest ([place]) 07/24/2016    Past Surgical History:  Procedure Laterality Date  . ADENOIDECTOMY    . PACEMAKER INSERTION Left 07/26/2016   Abbott Assurity Model #[id number] - Serial #[id number] -- complicated by pneumothorax requiring chest tube placement  . TEE WITHOUT CARDIOVERSION  02/2013   EF 55-60% with normal global function. No thrombus, mass or vegetation. Trivial-small pericardial effusion. Moderate LA dilation. Mild MR.  . TONSILLECTOMY    . TRANSTHORACIC ECHOCARDIOGRAM  07/24/2016   [facility]: EF 45-50%. Mid and apical inferior, inferolateral and apical hypokinesis. GR 2 DD. Mild LA and moderate RA dilation. Mild paracardial effusion. Moderate to severe TR. Basal and mid segment hypokinesis with apical hypokinesis. - Suspect stress-induced cardiopathy versus multivessel CAD.  Marland Kitchen TRANSTHORACIC ECHOCARDIOGRAM  07/26/2016   [facility]: LIMITED (post PPM) - although the report suggested EF 30-35% with apical akinesis, rounding note suggested this was an improvement     OB  History   None      Home Medications    Prior to Admission medications   Medication Sig Start Date End Date Taking? Authorizing Provider  loratadine (CLARITIN) 10 MG tablet Take 10 mg by mouth daily.    Yes [provider]  albuterol (PROVENTIL HFA;VENTOLIN HFA) 108 (90 Base) MCG/ACT inhaler Inhale 2 puffs into the lungs every 6 (six) hours as needed for wheezing or shortness of breath. 01/02/18   Saguier, Percell Miller, PA-C  Ascorbic Acid (VITAMIN C) 1000 MG tablet Take 1,000 mg by mouth daily.    [provider]  aspirin EC 81 MG tablet Take 81 mg by mouth daily.    [provider]  atorvastatin (LIPITOR) 10 MG tablet Take 1/2 tablet (5mg ) by mouth daily 03/08/17   Copland, Gay Filler, MD  azelastine (ASTELIN) 0.1 % nasal spray Place 1 spray into both nostrils 2 (two) times daily. Use in each nostril as directed 01/08/18   Saguier, Percell Miller, PA-C  azithromycin (ZITHROMAX) 250 MG tablet TAKE 2 TABLETS BY MOUTH TODAY, THEN TAKE 1 TABLET DAILY FOR 4 DAYS 02/12/18   [provider]  benzonatate (TESSALON) 100 MG capsule Take 1 capsule (100 mg total) by mouth 3 (three) times daily as needed for cough. 01/02/18   Saguier, Percell Miller, PA-C  Biotin 10000 MCG TABS Take 1 capsule by mouth daily.    [provider]  Calcium Citrate-Vitamin D (CITRACAL + D PO) Take 1 tablet by mouth 2 (two) times daily.    [provider]  ciclopirox (PENLAC) 8 % solution Apply topically at bedtime. Apply over nail and surrounding skin. Apply daily over previous coat. After seven (7) days, may remove with alcohol and continue cycle. 12/04/17   Saguier, Percell Miller, PA-C  docusate sodium (COLACE) 100 MG capsule Take 100 mg by mouth daily.    [provider]  ipratropium (ATROVENT HFA) 17 MCG/ACT inhaler Inhale 2 puffs into the lungs every 6 (six) hours as needed for wheezing. 01/08/18   Saguier, Percell Miller, PA-C  ketoconazole (NIZORAL) 2 % cream Apply 1 application topically daily. 12/04/17   Saguier, Percell Miller, PA-C  levocetirizine (XYZAL) 5 MG tablet TAKE 1 TABLET BY MOUTH EVERY DAY IN THE EVENING 03/17/18   Saguier, Percell Miller, PA-C  LUTEIN PO Take 1 tablet by mouth daily.    [provider]   Misc Natural Products (GLUCOS-CHONDROIT-MSM COMPLEX) TABS Take 2 tablets by mouth daily.    [provider]  Multiple Vitamins-Minerals (MULTIVITAMIN WITH MINERALS) tablet Take 1 tablet by mouth daily.    [provider]  mupirocin ointment (BACTROBAN) 2 % Apply to tip of nose twice daily 12/04/17   Saguier, Percell Miller, PA-C  Probiotic CAPS Take 1 capsule by mouth daily.    [provider]    Family History Family History  Problem Relation Age of Onset  . Allergic rhinitis Neg Hx   . Angioedema Neg Hx   . Asthma Neg Hx   . Eczema Neg Hx   . Immunodeficiency Neg Hx   . Urticaria Neg Hx     Social History Social History   Tobacco Use  . Smoking status: Former Smoker    Packs/day: 1.50    Years: 17.00    Pack years: 25.50    Types: Cigarettes    Start date: 12/18/1963    Last attempt to quit: 11/20/1983    Years since quitting: 34.3  . Smokeless tobacco: Never Used  Substance Use Topics  . Alcohol use: No  .  Drug use: No     Allergies   Lidex [fluocinonide]; Levaquin [levofloxacin in d5w]; Penicillins; and Rifampin   Review of Systems Review of Systems  Constitutional: Negative for chills and fever.  HENT: Negative for congestion and rhinorrhea.   Eyes: Negative for visual disturbance.  Respiratory: Negative for cough, shortness of breath and wheezing.   Cardiovascular: Negative for chest pain and leg swelling.  Gastrointestinal: Negative for abdominal pain, diarrhea, nausea and vomiting.  Genitourinary: Negative for dysuria and flank pain.  Musculoskeletal: Negative for neck pain and neck stiffness.  Skin: Negative for rash and wound.  Allergic/Immunologic: Negative for immunocompromised state.  Neurological: Positive for headaches. Negative for syncope and weakness.  All other systems reviewed and are negative.    Physical Exam Updated Vital Signs BP (!) 146/102 (BP Location: Left Arm)   Pulse 73   Temp 97.7 F (36.5 C) (Oral)   Resp  18   Ht 4\' 10"  (1.473 m)   Wt 43.7 kg (96 lb 5.5 oz)   SpO2 93%   BMI 20.14 kg/m   Physical Exam  Constitutional: She is oriented to person, place, and time. She appears well-developed and well-nourished. No distress.  HENT:  Head: Normocephalic and atraumatic.  Mouth/Throat: Oropharynx is clear and moist.  Eyes: Conjunctivae are normal.  Neck: Neck supple.  Cardiovascular: Normal rate, regular rhythm and normal heart sounds. Exam reveals no friction rub.  No murmur heard. Pulmonary/Chest: Effort normal and breath sounds normal. No respiratory distress. She has no wheezes. She has no rales.  Abdominal: Soft. She exhibits no distension.  Musculoskeletal: She exhibits no edema.  Neurological: She is alert and oriented to person, place, and time. She exhibits normal muscle tone. GCS eye subscore is 4. GCS verbal subscore is 5. GCS motor subscore is 6.  Skin: Skin is warm. Capillary refill takes less than 2 seconds.  Psychiatric: She has a normal mood and affect.  Nursing note and vitals reviewed.   Neurological Exam:  Mental Status: Alert and oriented to person, place, and time. Attention and concentration normal. Speech clear. Recent memory is intact. Cranial Nerves: Visual fields grossly intact. EOMI and PERRLA. No nystagmus noted. Facial sensation intact at forehead, maxillary cheek, and chin/mandible bilaterally. No facial asymmetry or weakness. Hearing grossly normal. Uvula is midline, and palate elevates symmetrically. Normal SCM and trapezius strength. Tongue midline without fasciculations. Motor: Muscle strength 5/5 in proximal and distal UE and LE bilaterally. No pronator drift. Muscle tone normal. Reflexes: 2+ and symmetrical in all four extremities.  Sensation: Intact to light touch in upper and lower extremities distally bilaterally.  Gait: Normal without ataxia. Coordination: Normal FTN bilaterally.   ED Treatments / Results  Labs (all labs ordered are listed, but only  abnormal results are displayed) Labs Reviewed - No data to display  EKG None  Radiology Ct Head Wo Contrast  Result Date: 03/31/2018 CLINICAL DATA:  Acute onset of generalized headache. EXAM: CT HEAD WITHOUT CONTRAST TECHNIQUE: Contiguous axial images were obtained from the base of the skull through the vertex without intravenous contrast. COMPARISON:  Paranasal sinus CT performed 11/06/2016 FINDINGS: Brain: No evidence of acute infarction, hemorrhage, hydrocephalus, extra-axial collection or mass lesion / mass effect. Prominence of the ventricles and sulci reflects mild cortical volume loss. Scattered periventricular and subcortical white matter change likely reflects small vessel ischemic microangiopathy. Mild cerebellar atrophy is noted. The brainstem and fourth ventricle are within normal limits. The basal ganglia are unremarkable in appearance. The cerebral hemispheres demonstrate grossly  normal gray-white differentiation. No mass effect or midline shift is seen. Vascular: No hyperdense vessel or unexpected calcification. Skull: There is no evidence of fracture; visualized osseous structures are unremarkable in appearance. Sinuses/Orbits: The orbits are within normal limits. The paranasal sinuses and mastoid air cells are well-aerated. Other: No significant soft tissue abnormalities are seen. IMPRESSION: 1. No acute intracranial pathology seen on CT. 2. Mild cortical volume loss and scattered small vessel ischemic microangiopathy. Electronically Signed   By: Garald Balding M.D.   On: 03/31/2018 21:54    Procedures Procedures (including critical care time)  Medications Ordered in ED Medications - No data to display   Initial Impression / Assessment and Plan / ED Course  I have reviewed the triage vital signs and the nursing notes.  Pertinent labs & imaging results that were available during my care of the patient were reviewed by me and considered in my medical decision making (see chart  for details).     84 yo F with PMHx as above here with mild frontal headache. Reportedly began after pt noticed a grasshopper on her forehead and was paranoid about this yesterday. Pt also outside for most of the day yesterday. On exam, pt is well appearing and in NAD. She does appear mildly dehydrated. Neuro exam non-focal.   CT head is negative. HA is mild, improved with tylenol prior to arrival. Etiology unclear - tp has h/o similar sx 2/2 anxiety in past. She is hypertensive here but states this is baseline when she is in hospital, and CT head neg for bleed, no vision changes, no other s/s HTN urgency. No flocal TTP over temporal arteries or signs of GCA. No neck stiffness, fever, or signs of meningitis or encephalitis. No focal neuro deficits to suggest CVA. CT neg for bleed and history, time course is not c/w this.  Discussed that I suspect there's a component of dehydration, would recommend fluids, labs but pt states she is reassured that CT head is neg and would like to return home. Family confirms she's at baseline and well appearing. Discussed that cannot r/o other etiologies, lyte abnormalities and pt requests d/c. Shared decision making discussion had. Will d/c with tylenol, encouraged fluids, good return precautions.  Final Clinical Impressions(s) / ED Diagnoses   Final diagnoses:  Bad headache  Dehydration    ED Discharge Orders    None       Duffy Bruce, MD 03/31/18 Gaetano Hawthorne    Duffy Bruce, MD 03/31/18 [phone]

## 2018-04-02 ENCOUNTER — Telehealth (HOSPITAL_BASED_OUTPATIENT_CLINIC_OR_DEPARTMENT_OTHER): Payer: Self-pay | Admitting: Emergency Medicine

## 2018-04-02 NOTE — Telephone Encounter (Signed)
Pt states she wants to change doctors and requesting information from Dr. Chriss Czar in that regard.  Informed patient of other providers in [facility] and to follow up as directed by EDP. Pt called back x2, wanting a referral from Dr. Chriss Czar.  Pt also requesting to speak to him regarding her headaches.  Informed patient on all three calls that she could return to her PCP or return to ED for any concerns.  Pt continues to want to speak to someone regarding ED provider.  Referred patient to our ED director.

## 2018-04-06 ENCOUNTER — Observation Stay (HOSPITAL_BASED_OUTPATIENT_CLINIC_OR_DEPARTMENT_OTHER)
Admission: EM | Admit: 2018-04-06 | Discharge: 2018-04-08 | Disposition: A | Discharge status: Home / Self Care | Payer: Medicare HMO | Attending: Internal Medicine | Admitting: Internal Medicine

## 2018-04-06 ENCOUNTER — Other Ambulatory Visit: Payer: Self-pay

## 2018-04-06 ENCOUNTER — Encounter (HOSPITAL_BASED_OUTPATIENT_CLINIC_OR_DEPARTMENT_OTHER): Payer: Self-pay | Admitting: *Deleted

## 2018-04-06 ENCOUNTER — Emergency Department (HOSPITAL_BASED_OUTPATIENT_CLINIC_OR_DEPARTMENT_OTHER): Payer: Medicare HMO

## 2018-04-06 DIAGNOSIS — I5022 Chronic systolic (congestive) heart failure: Secondary | ICD-10-CM | POA: Insufficient documentation

## 2018-04-06 DIAGNOSIS — R935 Abnormal findings on diagnostic imaging of other abdominal regions, including retroperitoneum: Secondary | ICD-10-CM | POA: Insufficient documentation

## 2018-04-06 DIAGNOSIS — K5909 Other constipation: Secondary | ICD-10-CM | POA: Diagnosis not present

## 2018-04-06 DIAGNOSIS — E7800 Pure hypercholesterolemia, unspecified: Secondary | ICD-10-CM | POA: Insufficient documentation

## 2018-04-06 DIAGNOSIS — Z950 Presence of cardiac pacemaker: Secondary | ICD-10-CM | POA: Diagnosis not present

## 2018-04-06 DIAGNOSIS — R51 Headache: Principal | ICD-10-CM | POA: Insufficient documentation

## 2018-04-06 DIAGNOSIS — I429 Cardiomyopathy, unspecified: Secondary | ICD-10-CM | POA: Insufficient documentation

## 2018-04-06 DIAGNOSIS — R188 Other ascites: Secondary | ICD-10-CM | POA: Diagnosis not present

## 2018-04-06 DIAGNOSIS — Z79899 Other long term (current) drug therapy: Secondary | ICD-10-CM | POA: Diagnosis not present

## 2018-04-06 DIAGNOSIS — R140 Abdominal distension (gaseous): Secondary | ICD-10-CM | POA: Diagnosis not present

## 2018-04-06 DIAGNOSIS — Z538 Procedure and treatment not carried out for other reasons: Secondary | ICD-10-CM | POA: Insufficient documentation

## 2018-04-06 DIAGNOSIS — F419 Anxiety disorder, unspecified: Secondary | ICD-10-CM | POA: Diagnosis not present

## 2018-04-06 DIAGNOSIS — Z8674 Personal history of sudden cardiac arrest: Secondary | ICD-10-CM | POA: Insufficient documentation

## 2018-04-06 DIAGNOSIS — J449 Chronic obstructive pulmonary disease, unspecified: Secondary | ICD-10-CM | POA: Diagnosis not present

## 2018-04-06 DIAGNOSIS — R079 Chest pain, unspecified: Secondary | ICD-10-CM | POA: Diagnosis not present

## 2018-04-06 DIAGNOSIS — Z7982 Long term (current) use of aspirin: Secondary | ICD-10-CM | POA: Diagnosis not present

## 2018-04-06 DIAGNOSIS — Z87891 Personal history of nicotine dependence: Secondary | ICD-10-CM | POA: Diagnosis not present

## 2018-04-06 DIAGNOSIS — K5900 Constipation, unspecified: Secondary | ICD-10-CM | POA: Diagnosis not present

## 2018-04-06 DIAGNOSIS — I110 Hypertensive heart disease with heart failure: Secondary | ICD-10-CM | POA: Diagnosis not present

## 2018-04-06 DIAGNOSIS — R519 Headache, unspecified: Secondary | ICD-10-CM | POA: Diagnosis present

## 2018-04-06 DIAGNOSIS — R0789 Other chest pain: Secondary | ICD-10-CM | POA: Diagnosis not present

## 2018-04-06 DIAGNOSIS — K651 Peritoneal abscess: Secondary | ICD-10-CM

## 2018-04-06 DIAGNOSIS — R69 Illness, unspecified: Secondary | ICD-10-CM | POA: Diagnosis not present

## 2018-04-06 DIAGNOSIS — N3289 Other specified disorders of bladder: Secondary | ICD-10-CM | POA: Insufficient documentation

## 2018-04-06 LAB — COMPREHENSIVE METABOLIC PANEL
ALBUMIN: 4 g/dL (ref 3.5–5.0)
ALT: 37 U/L (ref 14–54)
ANION GAP: 11 (ref 5–15)
AST: 42 U/L — AB (ref 15–41)
Alkaline Phosphatase: 58 U/L (ref 38–126)
BUN: 6 mg/dL (ref 6–20)
CHLORIDE: 94 mmol/L — AB (ref 101–111)
CO2: 26 mmol/L (ref 22–32)
Calcium: 8.8 mg/dL — ABNORMAL LOW (ref 8.9–10.3)
Creatinine, Ser: 0.36 mg/dL — ABNORMAL LOW (ref 0.44–1.00)
GFR calc Af Amer: >60 mL/min (ref >60)
GFR calc non Af Amer: >60 mL/min (ref >60)
Glucose, Bld: 105 mg/dL — ABNORMAL HIGH (ref 65–99)
POTASSIUM: 3.8 mmol/L (ref 3.5–5.1)
Sodium: 131 mmol/L — ABNORMAL LOW (ref 135–145)
Total Bilirubin: 0.7 mg/dL (ref 0.3–1.2)
Total Protein: 6.1 g/dL — ABNORMAL LOW (ref 6.5–8.1)

## 2018-04-06 LAB — CBC WITH DIFFERENTIAL/PLATELET
Basophils Absolute: 0 10*3/uL (ref 0.0–0.1)
Basophils Relative: 0 %
Eosinophils Absolute: 0.1 10*3/uL (ref 0.0–0.7)
Eosinophils Relative: 2 %
HEMATOCRIT: 36.3 % (ref 36.0–46.0)
Hemoglobin: 12.7 g/dL (ref 12.0–15.0)
Lymphocytes Relative: 20 %
Lymphs Abs: 1.3 10*3/uL (ref 0.7–4.0)
MCH: 30 pg (ref 26.0–34.0)
MCHC: 35 g/dL (ref 30.0–36.0)
MCV: 85.8 fL (ref 78.0–100.0)
MONO ABS: 0.9 10*3/uL (ref 0.1–1.0)
Monocytes Relative: 14 %
NEUTROS ABS: 4.1 10*3/uL (ref 1.7–7.7)
NEUTROS PCT: 64 %
Platelets: 180 10*3/uL (ref 150–400)
RBC: 4.23 MIL/uL (ref 3.87–5.11)
RDW: 14.7 % (ref 11.5–15.5)
WBC: 6.3 10*3/uL (ref 4.0–10.5)

## 2018-04-06 LAB — TROPONIN I: Troponin I: <0.03 ng/mL (ref <0.03)

## 2018-04-06 LAB — LIPASE, BLOOD: LIPASE: 28 U/L (ref 11–51)

## 2018-04-06 MED ORDER — FENTANYL CITRATE (PF) 100 MCG/2ML IJ SOLN
25.0000 ug | Freq: Once | INTRAMUSCULAR | Status: AC
  Administered 2018-04-06: 25 ug via INTRAVENOUS
  Filled 2018-04-06: qty 2

## 2018-04-06 MED ORDER — ACETAMINOPHEN 325 MG PO TABS
650.0000 mg | ORAL_TABLET | Freq: Once | ORAL | Status: AC
  Administered 2018-04-06: 650 mg via ORAL

## 2018-04-06 MED ORDER — ACETAMINOPHEN 325 MG PO TABS
ORAL_TABLET | ORAL | Status: AC
  Filled 2018-04-06: qty 2

## 2018-04-06 MED ORDER — SODIUM CHLORIDE 0.9 % IV BOLUS
500.0000 mL | Freq: Once | INTRAVENOUS | Status: AC
  Administered 2018-04-06: 500 mL via INTRAVENOUS

## 2018-04-06 MED ORDER — MAGNESIUM SULFATE 2 GM/50ML IV SOLN
2.0000 g | Freq: Once | INTRAVENOUS | Status: AC
  Administered 2018-04-07: 2 g via INTRAVENOUS
  Filled 2018-04-06: qty 50

## 2018-04-06 NOTE — ED Provider Notes (Signed)
[facility] EMERGENCY DEPARTMENT Provider Note   CSN: [id number] Arrival date & time: 04/06/18  1951     History   Chief Complaint Chief Complaint  Patient presents with  . Headache    HPI Yanilen Adamik is a 84 y.o. female.  HPI  84 year old female presents with a chief complaint of headache.  She states the headache is severe.  She was seen here a few days ago for the same but the headache seems to be worsening.  It comes and goes and has not been present every day.  She took Tylenol this morning and then was given Tylenol prior to me seeing her without any relief.  The headache is diffuse.  She states it is similar to the headache she was seen here earlier for but more severe.  There is no blurry vision, dizziness, weakness or numbness in the extremities.  No vomiting.  Since arrival to the ER she is starting to feel some chest burning in her lower chest.  She denies abdominal pain at this time but states it is on and off firm and distended. No urinary symptoms.  Past Medical History:  Diagnosis Date  . Bradycardic cardiac arrest ([place]) 07/24/2016   Required CPR, intubation with multiple rib fractures. Had short term cardiac shock with acute heart failure.  Resulted in possible stress-induced cardiomyopathy  . Cardiac related syncope 07/24/2016   Bradycardic arrest requiring CPR 2 with transvenous pacemaker placed followed by permanent pacemaker; complicated by respiratory arrest, type II MI, stress-induced cardiopathy  . Cardiomyopathy ([place]) 07/2016   Moderate severely reduced EF with apical hypokinesis suggestive of stress-induced cardiac myopathy versus multivessel CAD --> in setting of her to cardiac arrest  . COPD (chronic obstructive pulmonary disease) ([place])    By report  . History of chronic constipation   . Hypercholesterolemia   . Osteoporosis   . Pacemaker 07/26/2016   Abbott dual-chamber pacemaker: [place] #[id number] - Serial [id number]  .  Pneumonia 08/2016  . Sarcoidosis of lung ([place])   . Spinal stenosis     Patient Active Problem List   Diagnosis Date Noted  . CHB (complete heart block) ([place]) 12/20/2016  . Chronic systolic congestive heart failure ([place]) 12/20/2016  . Angioedema 12/17/2016  . Chronic rhinitis 12/17/2016  . Dyspnea 11/09/2016  . Upper airway cough syndrome 10/29/2016  . Abnormal CT of the chest  c/w lipoid pna 10/29/2016  . Toe ulcer, right ([place]) 10/17/2016  . Community acquired pneumonia of left lower lobe of lung ([place]) 09/19/2016  . S/P placement of cardiac pacemaker 09/19/2016  . History of syncope 09/19/2016  . Congestive dilated cardiomyopathy ([place]) 07/24/2016  . Bradycardic cardiac arrest ([place]) 07/24/2016    Past Surgical History:  Procedure Laterality Date  . ADENOIDECTOMY    . PACEMAKER INSERTION Left 07/26/2016   Abbott Assurity Model #[id number] - Serial #[id number] -- complicated by pneumothorax requiring chest tube placement  . TEE WITHOUT CARDIOVERSION  02/2013   EF 55-60% with normal global function. No thrombus, mass or vegetation. Trivial-small pericardial effusion. Moderate LA dilation. Mild MR.  . TONSILLECTOMY    . TRANSTHORACIC ECHOCARDIOGRAM  07/24/2016   [facility]: EF 45-50%. Mid and apical inferior, inferolateral and apical hypokinesis. GR 2 DD. Mild LA and moderate RA dilation. Mild paracardial effusion. Moderate to severe TR. Basal and mid segment hypokinesis with apical hypokinesis. - Suspect stress-induced cardiopathy versus multivessel CAD.  Marland Kitchen TRANSTHORACIC ECHOCARDIOGRAM  07/26/2016   [facility]: LIMITED (post  PPM) - although the report suggested EF 30-35% with apical akinesis, rounding note suggested this was an improvement     OB History   None      Home Medications    Prior to Admission medications   Medication Sig Start Date End Date Taking? Authorizing Provider  Ascorbic Acid (VITAMIN C) 1000 MG tablet Take 1,000  mg by mouth daily.   Yes [provider]  atorvastatin (LIPITOR) 10 MG tablet Take 1/2 tablet (5mg ) by mouth daily 03/08/17  Yes Copland, Gay Filler, MD  albuterol (PROVENTIL HFA;VENTOLIN HFA) 108 (90 Base) MCG/ACT inhaler Inhale 2 puffs into the lungs every 6 (six) hours as needed for wheezing or shortness of breath. 01/02/18   Saguier, Percell Miller, PA-C  aspirin EC 81 MG tablet Take 81 mg by mouth daily.    [provider]  azelastine (ASTELIN) 0.1 % nasal spray Place 1 spray into both nostrils 2 (two) times daily. Use in each nostril as directed 01/08/18   Saguier, Percell Miller, PA-C  azithromycin (ZITHROMAX) 250 MG tablet TAKE 2 TABLETS BY MOUTH TODAY, THEN TAKE 1 TABLET DAILY FOR 4 DAYS 02/12/18   [provider]  benzonatate (TESSALON) 100 MG capsule Take 1 capsule (100 mg total) by mouth 3 (three) times daily as needed for cough. 01/02/18   Saguier, Percell Miller, PA-C  Biotin 10000 MCG TABS Take 1 capsule by mouth daily.    [provider]  Calcium Citrate-Vitamin D (CITRACAL + D PO) Take 1 tablet by mouth 2 (two) times daily.    [provider]  ciclopirox (PENLAC) 8 % solution Apply topically at bedtime. Apply over nail and surrounding skin. Apply daily over previous coat. After seven (7) days, may remove with alcohol and continue cycle. 12/04/17   Saguier, Percell Miller, PA-C  docusate sodium (COLACE) 100 MG capsule Take 100 mg by mouth daily.    [provider]  ipratropium (ATROVENT HFA) 17 MCG/ACT inhaler Inhale 2 puffs into the lungs every 6 (six) hours as needed for wheezing. 01/08/18   Saguier, Percell Miller, PA-C  ketoconazole (NIZORAL) 2 % cream Apply 1 application topically daily. 12/04/17   Saguier, Percell Miller, PA-C  levocetirizine (XYZAL) 5 MG tablet TAKE 1 TABLET BY MOUTH EVERY DAY IN THE EVENING 03/17/18   Saguier, Percell Miller, PA-C  loratadine (CLARITIN) 10 MG tablet Take 10 mg by mouth daily.    [provider]  LUTEIN PO Take 1 tablet by mouth daily.    [provider]  Misc Natural Products (GLUCOS-CHONDROIT-MSM COMPLEX) TABS Take 2 tablets by mouth daily.    [provider]  Multiple Vitamins-Minerals (MULTIVITAMIN WITH MINERALS) tablet Take 1 tablet by mouth daily.    [provider]  mupirocin ointment (BACTROBAN) 2 % Apply to tip of nose twice daily 12/04/17   Saguier, Percell Miller, PA-C  Probiotic CAPS Take 1 capsule by mouth daily.    [provider]    Family History Family History  Problem Relation Age of Onset  . Allergic rhinitis Neg Hx   . Angioedema Neg Hx   . Asthma Neg Hx   . Eczema Neg Hx   . Immunodeficiency Neg Hx   . Urticaria Neg Hx     Social History Social History   Tobacco Use  . Smoking status: Former Smoker    Packs/day: 1.50    Years: 17.00    Pack years: 25.50    Types: Cigarettes    Start date: 12/18/1963    Last attempt to quit: 11/20/1983  Years since quitting: 34.4  . Smokeless tobacco: Never Used  Substance Use Topics  . Alcohol use: No  . Drug use: No     Allergies   Lidex [fluocinonide]; Levaquin [levofloxacin in d5w]; Penicillins; and Rifampin   Review of Systems Review of Systems  Constitutional: Negative for fever.  Eyes: Negative for photophobia and visual disturbance.  Respiratory: Negative for shortness of breath.   Cardiovascular: Positive for chest pain.  Gastrointestinal: Positive for abdominal distention and abdominal pain. Negative for vomiting.  Genitourinary: Negative for dysuria.  Musculoskeletal: Negative for neck pain and neck stiffness.  Neurological: Positive for headaches. Negative for weakness and numbness.  All other systems reviewed and are negative.    Physical Exam Updated Vital Signs BP (!) 163/90 (BP Location: Right Arm)   Pulse 70   Temp 97.9 F (36.6 C) (Oral)   Resp 12   Ht 4\' 10"  (1.473 m)   Wt 43.5 kg (96 lb)   SpO2 95%   BMI 20.06 kg/m   Physical Exam  Constitutional: She is oriented to person, place, and time. She  appears well-developed.  Non-toxic appearance. She does not appear ill. No distress.  frail  HENT:  Head: Normocephalic and atraumatic.  Right Ear: External ear normal.  Left Ear: External ear normal.  Nose: Nose normal.  Eyes: Pupils are equal, round, and reactive to light. EOM are normal. Right eye exhibits no discharge. Left eye exhibits no discharge.  Neck: Normal range of motion. Neck supple. No spinous process tenderness and no muscular tenderness present. Normal range of motion present.  Cardiovascular: Normal rate, regular rhythm and normal heart sounds.  Pulmonary/Chest: Effort normal and breath sounds normal.  Abdominal: Soft. She exhibits distension. There is tenderness (upper abdomen).  Neurological: She is alert and oriented to person, place, and time.  CN 3-12 grossly intact. 5/5 strength in all 4 extremities. Grossly normal sensation. Normal finger to nose.   Skin: Skin is warm and dry.  Nursing note and vitals reviewed.    ED Treatments / Results  Labs (all labs ordered are listed, but only abnormal results are displayed) Labs Reviewed  COMPREHENSIVE METABOLIC PANEL - Abnormal; Notable for the following components:      Result Value   Sodium 131 (*)    Chloride 94 (*)    Glucose, Bld 105 (*)    Creatinine, Ser 0.36 (*)    Calcium 8.8 (*)    Total Protein 6.1 (*)    AST 42 (*)    All other components within normal limits  LIPASE, BLOOD  TROPONIN I  CBC WITH DIFFERENTIAL/PLATELET  SEDIMENTATION RATE  URINALYSIS, ROUTINE W REFLEX MICROSCOPIC    EKG EKG Interpretation  Date/Time:  Sunday Apr 06 2018 22:30:37 EDT Ventricular Rate:  70 PR Interval:    QRS Duration: 146 QT Interval:  448 QTC Calculation: 484 R Axis:   -80 Text Interpretation:  Atrial-ventricular dual-paced rhythm No further analysis attempted due to paced rhythm No old tracing to compare Confirmed by Sherwood Gambler [phone]) on 04/06/2018 10:46:23 PM   Radiology No results  found.  Procedures Procedures (including critical care time)  Medications Ordered in ED Medications  magnesium sulfate IVPB 2 g 50 mL (2 g Intravenous New Bag/Given 04/07/18 0004)  acetaminophen (TYLENOL) tablet 650 mg (650 mg Oral Given 04/06/18 2115)  sodium chloride 0.9 % bolus 500 mL (0 mLs Intravenous Stopped 04/07/18 0005)  fentaNYL (SUBLIMAZE) injection 25 mcg (25 mcg Intravenous Given 04/06/18 2252)  Initial Impression / Assessment and Plan / ED Course  I have reviewed the triage vital signs and the nursing notes.  Pertinent labs & imaging results that were available during my care of the patient were reviewed by me and considered in my medical decision making (see chart for details).     Patient has severe headache.  This is not improved with Tylenol and then with a small bolus of fluids and small dose of fentanyl.  She is noted to be quite hypertensive with a maximum of 170/90.  Unclear if this is from having a severe headache or causing her headache.  Head CT will be repeated.  She is noted to have significant abdominal tenderness in the upper abdomen which could be causing her chest burning as well.  She will have a CT of the abdomen obtained.  Lab work is initially unremarkable.  If no other emergent findings are found, she may need blood pressure control as this may be the cause of her hypertension.  Her chest pain is quite atypical.  If not much better and still hypertensive, probably needs admission. Care to Dr. Dina Rich.  Final Clinical Impressions(s) / ED Diagnoses   Final diagnoses:  None    ED Discharge Orders    None       Sherwood Gambler, MD 04/07/18 [phone]

## 2018-04-06 NOTE — ED Triage Notes (Signed)
Pt seen here earlier this week for same. Family states headache resolved but then came back. States HA has been intermittent since then. Pt has been taking tylenol with some relief. States pain is in the back of her head.

## 2018-04-07 ENCOUNTER — Encounter (HOSPITAL_COMMUNITY): Payer: Self-pay | Admitting: General Surgery

## 2018-04-07 ENCOUNTER — Observation Stay (HOSPITAL_COMMUNITY): Payer: Medicare HMO

## 2018-04-07 DIAGNOSIS — Z7982 Long term (current) use of aspirin: Secondary | ICD-10-CM | POA: Diagnosis not present

## 2018-04-07 DIAGNOSIS — R1909 Other intra-abdominal and pelvic swelling, mass and lump: Secondary | ICD-10-CM | POA: Diagnosis not present

## 2018-04-07 DIAGNOSIS — F419 Anxiety disorder, unspecified: Secondary | ICD-10-CM | POA: Diagnosis not present

## 2018-04-07 DIAGNOSIS — Z538 Procedure and treatment not carried out for other reasons: Secondary | ICD-10-CM | POA: Diagnosis not present

## 2018-04-07 DIAGNOSIS — R519 Headache, unspecified: Secondary | ICD-10-CM | POA: Diagnosis present

## 2018-04-07 DIAGNOSIS — R935 Abnormal findings on diagnostic imaging of other abdominal regions, including retroperitoneum: Secondary | ICD-10-CM | POA: Diagnosis present

## 2018-04-07 DIAGNOSIS — K5909 Other constipation: Secondary | ICD-10-CM | POA: Diagnosis not present

## 2018-04-07 DIAGNOSIS — K5900 Constipation, unspecified: Secondary | ICD-10-CM | POA: Diagnosis not present

## 2018-04-07 DIAGNOSIS — I429 Cardiomyopathy, unspecified: Secondary | ICD-10-CM | POA: Diagnosis not present

## 2018-04-07 DIAGNOSIS — R51 Headache: Secondary | ICD-10-CM

## 2018-04-07 DIAGNOSIS — R140 Abdominal distension (gaseous): Secondary | ICD-10-CM | POA: Diagnosis not present

## 2018-04-07 DIAGNOSIS — Z950 Presence of cardiac pacemaker: Secondary | ICD-10-CM | POA: Diagnosis not present

## 2018-04-07 DIAGNOSIS — E7800 Pure hypercholesterolemia, unspecified: Secondary | ICD-10-CM | POA: Diagnosis not present

## 2018-04-07 DIAGNOSIS — Z79899 Other long term (current) drug therapy: Secondary | ICD-10-CM | POA: Diagnosis not present

## 2018-04-07 DIAGNOSIS — Z87891 Personal history of nicotine dependence: Secondary | ICD-10-CM | POA: Diagnosis not present

## 2018-04-07 DIAGNOSIS — I110 Hypertensive heart disease with heart failure: Secondary | ICD-10-CM | POA: Diagnosis not present

## 2018-04-07 DIAGNOSIS — J449 Chronic obstructive pulmonary disease, unspecified: Secondary | ICD-10-CM | POA: Diagnosis not present

## 2018-04-07 DIAGNOSIS — N3289 Other specified disorders of bladder: Secondary | ICD-10-CM | POA: Diagnosis not present

## 2018-04-07 DIAGNOSIS — Z8674 Personal history of sudden cardiac arrest: Secondary | ICD-10-CM | POA: Diagnosis not present

## 2018-04-07 DIAGNOSIS — R69 Illness, unspecified: Secondary | ICD-10-CM | POA: Diagnosis not present

## 2018-04-07 DIAGNOSIS — R079 Chest pain, unspecified: Secondary | ICD-10-CM | POA: Diagnosis not present

## 2018-04-07 DIAGNOSIS — I5022 Chronic systolic (congestive) heart failure: Secondary | ICD-10-CM | POA: Diagnosis not present

## 2018-04-07 DIAGNOSIS — R188 Other ascites: Secondary | ICD-10-CM | POA: Diagnosis not present

## 2018-04-07 LAB — URINALYSIS, ROUTINE W REFLEX MICROSCOPIC
BILIRUBIN URINE: NEGATIVE
GLUCOSE, UA: NEGATIVE mg/dL
Hgb urine dipstick: NEGATIVE
KETONES UR: NEGATIVE mg/dL
Leukocytes, UA: NEGATIVE
NITRITE: NEGATIVE
PH: 7.5 (ref 5.0–8.0)
Protein, ur: NEGATIVE mg/dL
Specific Gravity, Urine: 1.01 (ref 1.005–1.030)

## 2018-04-07 LAB — PROTIME-INR
INR: 1.08
Prothrombin Time: 13.9 seconds (ref 11.4–15.2)

## 2018-04-07 LAB — SEDIMENTATION RATE: Sed Rate: 14 mm/hr (ref 0–22)

## 2018-04-07 LAB — C-REACTIVE PROTEIN: CRP: 4.1 mg/dL — ABNORMAL HIGH (ref <1.0)

## 2018-04-07 LAB — MRSA PCR SCREENING: MRSA by PCR: NEGATIVE

## 2018-04-07 MED ORDER — ADULT MULTIVITAMIN W/MINERALS CH
1.0000 | ORAL_TABLET | Freq: Every day | ORAL | Status: DC
  Administered 2018-04-08: 1 via ORAL
  Filled 2018-04-07: qty 1

## 2018-04-07 MED ORDER — DOCUSATE SODIUM 100 MG PO CAPS
100.0000 mg | ORAL_CAPSULE | Freq: Every day | ORAL | Status: DC
  Administered 2018-04-08 (×2): 100 mg via ORAL
  Filled 2018-04-07 (×2): qty 1

## 2018-04-07 MED ORDER — DIPHENHYDRAMINE HCL 50 MG/ML IJ SOLN
12.5000 mg | Freq: Once | INTRAMUSCULAR | Status: AC
  Administered 2018-04-07: 12.5 mg via INTRAVENOUS
  Filled 2018-04-07: qty 1

## 2018-04-07 MED ORDER — KETOROLAC TROMETHAMINE 15 MG/ML IJ SOLN: 15.0000 mg | Freq: Four times a day (QID) | INTRAMUSCULAR | Status: DC | PRN

## 2018-04-07 MED ORDER — IPRATROPIUM-ALBUTEROL 0.5-2.5 (3) MG/3ML IN SOLN: 3.0000 mL | Freq: Four times a day (QID) | RESPIRATORY_TRACT | Status: DC | PRN

## 2018-04-07 MED ORDER — NAPROXEN 375 MG PO TABS
375.0000 mg | ORAL_TABLET | Freq: Two times a day (BID) | ORAL | Status: DC | PRN
  Filled 2018-04-07: qty 1

## 2018-04-07 MED ORDER — LIDOCAINE-EPINEPHRINE (PF) 1 %-1:200000 IJ SOLN
INTRAMUSCULAR | Status: AC | PRN
  Administered 2018-04-07: 10 mL via INTRADERMAL

## 2018-04-07 MED ORDER — FENTANYL CITRATE (PF) 100 MCG/2ML IJ SOLN
INTRAMUSCULAR | Status: AC
  Filled 2018-04-07: qty 4

## 2018-04-07 MED ORDER — LORATADINE 10 MG PO TABS: 10.0000 mg | ORAL_TABLET | Freq: Every day | ORAL | Status: DC

## 2018-04-07 MED ORDER — ONDANSETRON HCL 4 MG/2ML IJ SOLN: 4.0000 mg | Freq: Four times a day (QID) | INTRAMUSCULAR | Status: DC | PRN

## 2018-04-07 MED ORDER — ONDANSETRON HCL 4 MG PO TABS: 4.0000 mg | ORAL_TABLET | Freq: Four times a day (QID) | ORAL | Status: DC | PRN

## 2018-04-07 MED ORDER — ACETAMINOPHEN 650 MG RE SUPP: 650.0000 mg | Freq: Four times a day (QID) | RECTAL | Status: DC | PRN

## 2018-04-07 MED ORDER — LEVOCETIRIZINE DIHYDROCHLORIDE 5 MG PO TABS: 5.0000 mg | ORAL_TABLET | Freq: Every evening | ORAL | Status: DC

## 2018-04-07 MED ORDER — HYDRALAZINE HCL 20 MG/ML IJ SOLN: 5.0000 mg | INTRAMUSCULAR | Status: DC | PRN

## 2018-04-07 MED ORDER — ATORVASTATIN CALCIUM 10 MG PO TABS
5.0000 mg | ORAL_TABLET | Freq: Every day | ORAL | Status: DC
  Administered 2018-04-07: 5 mg via ORAL
  Filled 2018-04-07: qty 1

## 2018-04-07 MED ORDER — LORATADINE 10 MG PO TABS
10.0000 mg | ORAL_TABLET | Freq: Every day | ORAL | Status: DC
  Administered 2018-04-07 – 2018-04-08 (×2): 10 mg via ORAL
  Filled 2018-04-07 (×2): qty 1

## 2018-04-07 MED ORDER — IOPAMIDOL (ISOVUE-300) INJECTION 61%
100.0000 mL | Freq: Once | INTRAVENOUS | Status: AC | PRN
  Administered 2018-04-07: 100 mL via INTRAVENOUS

## 2018-04-07 MED ORDER — ACETAMINOPHEN 325 MG PO TABS
650.0000 mg | ORAL_TABLET | Freq: Four times a day (QID) | ORAL | Status: DC | PRN
  Administered 2018-04-07 – 2018-04-08 (×2): 650 mg via ORAL
  Filled 2018-04-07 (×3): qty 2

## 2018-04-07 MED ORDER — IPRATROPIUM BROMIDE 0.02 % IN SOLN: 2.5000 mL | Freq: Four times a day (QID) | RESPIRATORY_TRACT | Status: DC | PRN

## 2018-04-07 MED ORDER — ENOXAPARIN SODIUM 30 MG/0.3ML ~~LOC~~ SOLN: 30.0000 mg | Freq: Every day | SUBCUTANEOUS | Status: DC

## 2018-04-07 MED ORDER — ALBUTEROL SULFATE (2.5 MG/3ML) 0.083% IN NEBU: 3.0000 mL | INHALATION_SOLUTION | Freq: Four times a day (QID) | RESPIRATORY_TRACT | Status: DC | PRN

## 2018-04-07 MED ORDER — MIDAZOLAM HCL 2 MG/2ML IJ SOLN
INTRAMUSCULAR | Status: AC
  Filled 2018-04-07: qty 4

## 2018-04-07 MED ORDER — ACETAMINOPHEN 325 MG PO TABS
650.0000 mg | ORAL_TABLET | Freq: Four times a day (QID) | ORAL | Status: DC | PRN
  Administered 2018-04-07: 650 mg via ORAL
  Filled 2018-04-07: qty 2

## 2018-04-07 MED ORDER — HYDRALAZINE HCL 20 MG/ML IJ SOLN
5.0000 mg | Freq: Once | INTRAMUSCULAR | Status: AC
  Administered 2018-04-07: 5 mg via INTRAVENOUS
  Filled 2018-04-07: qty 1

## 2018-04-07 MED ORDER — SODIUM CHLORIDE 0.9 % IV SOLN
INTRAVENOUS | Status: DC
  Administered 2018-04-07: 06:00:00 via INTRAVENOUS
  Administered 2018-04-07: 1000 mL via INTRAVENOUS

## 2018-04-07 MED ORDER — GUAIFENESIN-DM 100-10 MG/5ML PO SYRP
5.0000 mL | ORAL_SOLUTION | ORAL | Status: DC | PRN
  Filled 2018-04-07: qty 10

## 2018-04-07 NOTE — ED Notes (Signed)
Pt sats dropped slightly after being given Benadryl. Placed on O2 2L/[place].

## 2018-04-07 NOTE — ED Notes (Signed)
Called to room. Pt c/o "chest burning" and head still hurting. EDP advised and will see pt. VSS.

## 2018-04-07 NOTE — ED Notes (Signed)
Returned from CT.

## 2018-04-07 NOTE — Progress Notes (Signed)
Please see Dr. Pascal Lux with IR's note regarding cancelled procedure.  On noncontrasted scan today, this area of concern on initial scan was air filled and appeared to be bowel as opposed to abscess.  This would correlate with no abdominal pain, normal WBC, and no fevers.  No further intervention warranted for this patient's abdomen.  She has been started on a regular diet.  Surgery will sign off.  Henreitta Cea 1:04 PM 04/07/2018

## 2018-04-07 NOTE — Progress Notes (Signed)
Brief note: lovenox rx adjusted Lovenox to 30 mg daily in pt with CrCl<30 ml/min and wt <45 kg.  Thanks Dorrene German 04/07/2018

## 2018-04-07 NOTE — ED Notes (Signed)
Report given to Tray Printmaker) and Magda Paganini [facility])

## 2018-04-07 NOTE — ED Provider Notes (Signed)
Patient signed out pending CT imaging of the head and abdomen.  In brief, this is a 84 year old female who presents with headache.  Was seen and evaluated several days ago.  At that time declined further work-up with the exception of imaging which was negative.  Had recurrence of headache.  Son reports over the last several weeks she has had more frequent headaches.  She was notably hypertensive here.  Son reports that usually her blood pressure is "low."  Initial lab work-up is largely unremarkable.  Prior provider with exam that was nonfocal.  However, he noted abdominal distention and some tenderness specifically in the right mid abdomen.  Given her age and comorbidities, CT head and abdomen were obtained.  2:24 AM Received a call from radiology.  CT head is negative.  CT abdomen with a difficult to characterize possible early fluid collection in the abdomen.  It does appear extraluminal.  Question organizing abscess.  Clinically, patient is afebrile and has no significant leukocytosis or left shift.  On my repeat exam she does have some tenderness and distention.  She continues to plan complain of headache, chest pain, and abdominal pain.  Patient was given a small dose of Benadryl and hydralazine given that her blood pressure continues to be somewhat elevated.  It is hard to know exactly the clinical significance of the CT finding.  However, given her age and inability to aggressively treat her headache secondary to sensitivity and effective medication, will admit for further management.  She may need neurology evaluation.  Of note, sed rate was normal.  She has been normal.  Her neurologic exam remains reassuring.  Will discuss with admitting hospitalist.  We will defer surgical consultation.   Merryl Hacker, MD 04/07/18 [phone]

## 2018-04-07 NOTE — Progress Notes (Signed)
Chief Complaint: Patient was seen in consultation today for abdominal abscess at the request of Saverio Danker, PA-C  Referring Physician(s): Saverio Danker PA-C  Supervising Physician: Sandi Mariscal  Patient Status: [facility] - In-pt  History of Present Illness: Tracey Holt is a 84 y.o. female admitted with c/o headache and abd bloating. She was tender on abdominal exam so CT was ordered. This showed an intra-abdominal air/fluid collection concerning for abscess, however no apparent inflammatory changes to suggest a source. The pt does report chronic constipation. Surgery was consulted and recommends IR guided aspiration/drainage of fluid collection. Chart, imaging, meds, labs reviewed. Son at bedside. Has been NPO since admission.  Past Medical History:  Diagnosis Date  . Bradycardic cardiac arrest ([place]) 07/24/2016   Required CPR, intubation with multiple rib fractures. Had short term cardiac shock with acute heart failure.  Resulted in possible stress-induced cardiomyopathy  . Cardiac related syncope 07/24/2016   Bradycardic arrest requiring CPR 2 with transvenous pacemaker placed followed by permanent pacemaker; complicated by respiratory arrest, type II MI, stress-induced cardiopathy  . Cardiomyopathy ([place]) 07/2016   Moderate severely reduced EF with apical hypokinesis suggestive of stress-induced cardiac myopathy versus multivessel CAD --> in setting of her to cardiac arrest  . COPD (chronic obstructive pulmonary disease) ([place])    By report  . History of chronic constipation   . Hypercholesterolemia   . Osteoporosis   . Pacemaker 07/26/2016   Abbott dual-chamber pacemaker: [place] #[id number] - Serial [id number]  . Pneumonia 08/2016  . Sarcoidosis of lung ([place])   . Spinal stenosis     Past Surgical History:  Procedure Laterality Date  . ADENOIDECTOMY    . PACEMAKER INSERTION Left 07/26/2016   Abbott Assurity Model #[id number] - Serial #[id number] -- complicated by  pneumothorax requiring chest tube placement  . TEE WITHOUT CARDIOVERSION  02/2013   EF 55-60% with normal global function. No thrombus, mass or vegetation. Trivial-small pericardial effusion. Moderate LA dilation. Mild MR.  . TONSILLECTOMY    . TRANSTHORACIC ECHOCARDIOGRAM  07/24/2016   [facility]: EF 45-50%. Mid and apical inferior, inferolateral and apical hypokinesis. GR 2 DD. Mild LA and moderate RA dilation. Mild paracardial effusion. Moderate to severe TR. Basal and mid segment hypokinesis with apical hypokinesis. - Suspect stress-induced cardiopathy versus multivessel CAD.  Marland Kitchen TRANSTHORACIC ECHOCARDIOGRAM  07/26/2016   [place]: LIMITED (post PPM) - although the report suggested EF 30-35% with apical akinesis, rounding note suggested this was an improvement    Allergies: Dust mite extract; Lidex [fluocinonide]; Molds & smuts; Lactose; Levaquin [levofloxacin in d5w]; Penicillins; and Rifampin  Medications:  Current Facility-Administered Medications:  .  0.9 %  sodium chloride infusion, , Intravenous, Continuous, Etta Quill, DO, Last Rate: 50 mL/hr at 04/07/18 0533 .  acetaminophen (TYLENOL) tablet 650 mg, 650 mg, Oral, Q6H PRN, 650 mg at 04/07/18 0531 **OR** acetaminophen (TYLENOL) suppository 650 mg, 650 mg, Rectal, Q6H PRN, Etta Quill, DO .  atorvastatin (LIPITOR) tablet 5 mg, 5 mg, Oral, q1800, Alcario Drought, Jared M, DO .  docusate sodium (COLACE) capsule 100 mg, 100 mg, Oral, Daily, Alcario Drought, Jared M, DO .  hydrALAZINE (APRESOLINE) injection 5 mg, 5 mg, Intravenous, Q4H PRN, Alcario Drought, Jared M, DO .  ipratropium-albuterol (DUONEB) 0.5-2.5 (3) MG/3ML nebulizer solution 3 mL, 3 mL, Nebulization, Q6H PRN, Alcario Drought, Jared M, DO .  loratadine (CLARITIN) tablet 10 mg, 10 mg, Oral, Daily, Alcario Drought, Jared M, DO .  multivitamin with minerals tablet 1 tablet, 1  tablet, Oral, Daily, Alcario Drought, Jared M, DO .  naproxen (NAPROSYN) tablet 375 mg, 375  mg, Oral, BID PRN, Alcario Drought, Jared M, DO .  ondansetron [facility]) tablet 4 mg, 4 mg, Oral, Q6H PRN **OR** ondansetron (ZOFRAN) injection 4 mg, 4 mg, Intravenous, Q6H PRN, Etta Quill, DO    Family History  Problem Relation Age of Onset  . Allergic rhinitis Neg Hx   . Angioedema Neg Hx   . Asthma Neg Hx   . Eczema Neg Hx   . Immunodeficiency Neg Hx   . Urticaria Neg Hx     Social History   Socioeconomic History  . Marital status: Widowed    Spouse name: Not on file  . Number of children: Not on file  . Years of education: Not on file  . Highest education level: Not on file  Occupational History  . Not on file  Social Needs  . Financial resource strain: Not on file  . Food insecurity:    Worry: Not on file    Inability: Not on file  . Transportation needs:    Medical: Not on file    Non-medical: Not on file  Tobacco Use  . Smoking status: Former Smoker    Packs/day: 1.50    Years: 17.00    Pack years: 25.50    Types: Cigarettes    Start date: 12/18/1963    Last attempt to quit: 11/20/1983    Years since quitting: 34.4  . Smokeless tobacco: Never Used  Substance and Sexual Activity  . Alcohol use: No  . Drug use: No  . Sexual activity: Not on file  Lifestyle  . Physical activity:    Days per week: Not on file    Minutes per session: Not on file  . Stress: Not on file  Relationships  . Social connections:    Talks on phone: Not on file    Gets together: Not on file    Attends religious service: Not on file    Active member of club or organization: Not on file    Attends meetings of clubs or organizations: Not on file    Relationship status: Not on file  Other Topics Concern  . Not on file  Social History Narrative   Recently moved to [place] from [place] to live near her daughter.     Review of Systems: A 12 point ROS discussed and pertinent positives are indicated in the HPI above.  All other systems are negative.  Review of  Systems  Vital Signs: BP (!) 150/85 (BP Location: Right Arm) Comment: nurse notified  Pulse 71   Temp 97.6 F (36.4 C) (Oral)   Resp 18   Ht 4\' 10"  (1.473 m)   Wt 96 lb (43.5 kg)   SpO2 99%   BMI 20.06 kg/m   Physical Exam  Constitutional: She is oriented to person, place, and time. She appears well-developed. No distress.  HENT:  Head: Normocephalic.  Mouth/Throat: Oropharynx is clear and moist.  Cardiovascular: Normal rate, regular rhythm and normal heart sounds.  Pulmonary/Chest: Effort normal and breath sounds normal. No respiratory distress.  Abdominal: Soft. She exhibits distension. There is tenderness.  Neurological: She is alert and oriented to person, place, and time.  Skin: Skin is warm and dry.    Imaging: Dg Chest 2 View  Result Date: 04/07/2018 CLINICAL DATA:  Chest pain EXAM: CHEST - 2 VIEW COMPARISON:  02/17/2018 FINDINGS: Left-sided pacing device as before. Diffuse coarse chronic interstitial opacity. Atelectasis  at the left base. Cardiomegaly with aortic atherosclerosis. No pneumothorax. Kyphosis of the spine with multiple compression deformities as before. Small hiatal hernia. IMPRESSION: 1. Streaky atelectasis at the left base 2. Cardiomegaly Electronically Signed   By: Donavan Foil M.D.   On: 04/07/2018 01:41   Ct Head Wo Contrast  Result Date: 04/07/2018 CLINICAL DATA:  Severe headache EXAM: CT HEAD WITHOUT CONTRAST TECHNIQUE: Contiguous axial images were obtained from the base of the skull through the vertex without intravenous contrast. COMPARISON:  03/31/2018, 11/06/2016 FINDINGS: Brain: No acute territorial infarction, hemorrhage or intracranial mass. Stable atrophy and small vessel ischemic changes of the white matter. Stable ventricle size Vascular: No hyperdense vessels.  Carotid vascular calcification Skull: No fracture Sinuses/Orbits: No acute finding. Other: None IMPRESSION: 1. No CT evidence for acute intracranial abnormality. 2. Atrophy and small  vessel ischemic changes of the white matter Electronically Signed   By: Donavan Foil M.D.   On: 04/07/2018 01:44   Ct Head Wo Contrast  Result Date: 03/31/2018 CLINICAL DATA:  Acute onset of generalized headache. EXAM: CT HEAD WITHOUT CONTRAST TECHNIQUE: Contiguous axial images were obtained from the base of the skull through the vertex without intravenous contrast. COMPARISON:  Paranasal sinus CT performed 11/06/2016 FINDINGS: Brain: No evidence of acute infarction, hemorrhage, hydrocephalus, extra-axial collection or mass lesion / mass effect. Prominence of the ventricles and sulci reflects mild cortical volume loss. Scattered periventricular and subcortical white matter change likely reflects small vessel ischemic microangiopathy. Mild cerebellar atrophy is noted. The brainstem and fourth ventricle are within normal limits. The basal ganglia are unremarkable in appearance. The cerebral hemispheres demonstrate grossly normal gray-white differentiation. No mass effect or midline shift is seen. Vascular: No hyperdense vessel or unexpected calcification. Skull: There is no evidence of fracture; visualized osseous structures are unremarkable in appearance. Sinuses/Orbits: The orbits are within normal limits. The paranasal sinuses and mastoid air cells are well-aerated. Other: No significant soft tissue abnormalities are seen. IMPRESSION: 1. No acute intracranial pathology seen on CT. 2. Mild cortical volume loss and scattered small vessel ischemic microangiopathy. Electronically Signed   By: Garald Balding M.D.   On: 03/31/2018 21:54   Ct Abdomen Pelvis W Contrast  Result Date: 04/07/2018 CLINICAL DATA:  Abdominal bloating and distension EXAM: CT ABDOMEN AND PELVIS WITH CONTRAST TECHNIQUE: Multidetector CT imaging of the abdomen and pelvis was performed using the standard protocol following bolus administration of intravenous contrast. CONTRAST:  144mL ISOVUE-300 IOPAMIDOL (ISOVUE-300) INJECTION 61%  COMPARISON:  Head CT 10/12/2016 FINDINGS: Lower chest: Lung bases demonstrate stable 2.6 cm soft tissue density in the lingula. Smaller stellate soft tissue density 17 mm in the right middle lobe, no change since 2017 PET-CT and therefore likely benign. Cardiomegaly with partially visualized cardiac pacing leads. Hepatobiliary: No calcified gallstone or biliary dilatation. No focal hepatic abnormality. Pancreas: Unremarkable. No pancreatic ductal dilatation or surrounding inflammatory changes. Spleen: Normal in size without focal abnormality. Adrenals/Urinary Tract: Adrenal glands are within normal limits. Exophytic cyst upper pole right kidney. No hydronephrosis. Markedly distended urinary bladder Stomach/Bowel: Stomach nonenlarged. No dilated small bowel. No colon wall thickening. Appendix not confidently identified. Vascular/Lymphatic: Moderate to marked aortic atherosclerosis. No aneurysmal dilatation. No significantly enlarged lymph nodes Reproductive: Uterus and bilateral adnexa are unremarkable. Other: Small amount of free fluid within the abdomen and pelvis. Featureless fluid and gas collection in the right abdomen, best seen on coronal views, series 5, image number 27, measuring 8.9 x 2.4 cm, appears to be paralleling the adjacent collapsed colon.  Musculoskeletal: Scoliosis and degenerative changes of the spine. No acute or suspicious abnormality IMPRESSION: 1. Featureless gas and fluid collection in the right mid abdomen, best seen on coronal views, appears to be paralleling the collapsed adjacent colon, appearance on the coronal views suggests possible extraluminal collection as may be seen with organizing abscess or occult/contained perforation. Of note, there is no significant bowel wall thickening identified within the abdomen and the appendix is not confidently identified. 2. Small amount of ascites within the abdomen 3. No evidence for a bowel obstruction 4. Marked distension of the urinary bladder  5. Stable lingular and right middle lobe soft tissue densities. Critical Value/emergent results were called by telephone at the time of interpretation on 04/07/2018 at 2:20 am to Dr. Dina Rich, who verbally acknowledged these results. Electronically Signed   By: Donavan Foil M.D.   On: 04/07/2018 02:21   Dg Hip Unilat W Or W/o Pelvis 2-3 Views Left  Result Date: 03/14/2018 CLINICAL DATA:  Left hip joint pain. EXAM: DG HIP (WITH OR WITHOUT PELVIS) 2-3V LEFT COMPARISON:  None. FINDINGS: Both hips show normal joint space height. There are small femoral head osteophytes on the left. No evidence of fracture. No evidence of advanced sacroiliac or symphysis pubis degenerative change. No other focal finding. There is curvature in degenerative change of the spine. IMPRESSION: No significant degenerative change. No traumatic finding. Small left femoral head marginal osteophytes. Electronically Signed   By: Nelson Chimes M.D.   On: 03/14/2018 16:06    Labs:  CBC: Recent Labs    12/04/17 1251 01/02/18 1421 01/08/18 1405 04/06/18 2236  WBC 5.4 4.5 7.3 6.3  HGB 13.1 13.1 13.9 12.7  HCT 39.8 39.9 42.3 36.3  PLT 201.0 186.0 238.0 180    COAGS: No results for input(s): INR, APTT in the last 8760 hours.  BMP: Recent Labs    12/04/17 1251 01/02/18 1421 01/08/18 1405 04/06/18 2236  NA 135 133* 134* 131*  K 4.8 4.2 5.1 3.8  CL 96 97 96 94*  CO2 33* 34* 35* 26  GLUCOSE 105* 98 85 105*  BUN 15 14 16 6   CALCIUM 10.2 9.2 10.2 8.8*  CREATININE 0.56 0.58 0.63 0.36*  GFRNONAA  --   --   --  >60  GFRAA  --   --   --  >60    LIVER FUNCTION TESTS: Recent Labs    12/04/17 1251 01/02/18 1421 01/08/18 1405 04/06/18 2236  BILITOT 0.5 0.6 0.7 0.7  AST 37 35 35 42*  ALT 28 28 31  37  ALKPHOS 66 65 63 58  PROT 7.0 6.8 6.9 6.1*  ALBUMIN 4.1 4.1 4.1 4.0    TUMOR MARKERS: No results for input(s): AFPTM, CEA, CA199, CHROMGRNA in the last 8760 hours.  Assessment and Plan: Intra-abdominal fluid  collection, unknown underlying source. Imaging reviewed, amenable to perc aspiration/drain Labs ok Risks and benefits discussed with the patient including bleeding, infection, damage to adjacent structures, bowel perforation/fistula connection, and sepsis.  All of the patient's questions were answered, patient is agreeable to proceed. Consent signed and in chart.    Thank you for this interesting consult.  I greatly enjoyed meeting Tracey Holt and look forward to participating in their care.  A copy of this report was sent to the requesting provider on this date.  Electronically Signed: Ascencion Dike, PA-C 04/07/2018, 11:34 AM   I spent a total of 20 minutes in face to face in clinical consultation, greater than 50% of which was  counseling/coordinating care for abdominal fluid collection drainage

## 2018-04-07 NOTE — ED Notes (Signed)
ED Provider at bedside. 

## 2018-04-07 NOTE — Progress Notes (Signed)
PROGRESS NOTE   Tracey Holt  OZD:[id number]    DOB: 1922-02-10    DOA: 04/06/2018  PCP: Elise Benne   I have briefly reviewed patients previous medical records in [facility].  Brief Narrative:  84 year old female, resides at [facility] [place], independent, not on home oxygen, PMH of bradycardic cardiac arrest, cardiogenic shock, acute heart failure, stress-induced cardiomyopathy 07/24/2016, COPD, chronic constipation, HLD, PPM, sarcoidosis, anxiety was seen in ED 03/31/2018 for headache that began after patient noticed a Grasshopper on her forehead and was paranoid about it, CT head negative, improved with Tylenol, discharged home, tented to the ED 04/07/2018 with complaints of severe headache.  CT head, ESR negative.  CT abdomen and pelvis concerning for perforation and collection.  Admitted for observation.  General surgery consulted.   Assessment & Plan:   Principal Problem:   Intractable headache Active Problems:   S/P placement of cardiac pacemaker   Abnormal CT of the abdomen   Headache: Unclear etiology.  CT head 5/13 and 5/20 without acute abnormality.  ESR 14.  CRP 4.1, unclear significance.  No focal deficits on exam.  No prior history of chronic headaches but does have it infrequently and intermittently.  Had her eyes checked couple of months back and told to come back in a year.  Does report some neck pain?  Cervicogenic headache.  Does not seem related to allergies/sinus disease.  Low index of suspicion for migraine or tension headache or temporal arteritis (normal ESR and no temporal artery tenderness) or viral meningitis.  Good relief with Tylenol, continue.  Anxiety likely contributing.  K pad to the neck.  Adequate hydration.  Discussed with neuro hospitalist, agrees with above and may use Naprosyn couple times a week if Tylenol alone does not help.  Abnormal CT abdomen: CT abdomen 5/20 were concerning for extraluminal collection in the abdomen.  General surgery was  consulted and were concerned that this was a infected fluid collection and requested IR to aspirate.  However as per IR, the air and fluid collection along the right pericolic gutter is now favored to represent a patulous loop of intestine and not an air and fluid collection (based on repeat CT abdomen without contrast).  Resumed diet.  Monitor.  Chronic constipation: Bowel regimen.  Hypertension: PRN IV hydralazine.  History of bradycardic cardiac arrest/CHF, likely acute systolic then/PPM: TTE 2595 showed EF 50-55%.  Stable without features of volume overload.  COPD: Stable without clinical bronchospasm.  Anxiety/suspect cognitive impairment: Possible underlying dementia.  Delirium precautions.  Minimize opioids.  Markedly distended bladder: Seen on CT abdomen.  History of frequent urination?  Incomplete emptying.  This may also be contributing to abdominal symptoms.  Check bladder scan and in and out catheterization if needed.   DVT prophylaxis: SCDs. Code Status: Full Family Communication: Discussed in detail with patient's son at bedside.  Updated care and answered questions Disposition: DC home pending clinical improvement, possibly 5/21.   Consultants:  General surgery Interventional radiology  Procedures:  None  Antimicrobials:  None   Subjective: Seen this morning.  Patient and son at bedside provided history.  Prior history of occasional mild headaches and neck pains.  No history of migraines.  Had episode of severe headache prompting ED visit a week ago.  Currently says headache significantly improved after a dose of Tylenol.  Denies visual symptoms.  Was n.p.o. for possible procedure and desperately wanted something to eat.  Does not drink excess coffee.  Abdomen tender only to palpation.  Chronic constipation.  ROS: As above.  Objective:  Vitals:   04/07/18 0240 04/07/18 0450 04/07/18 1240 04/07/18 1407  BP: (!) 154/84 (!) 150/85 (!) 159/90 (!) 141/75  Pulse: 70  71 79 82  Resp: 18 18 (!) 24   Temp:  97.6 F (36.4 C)  98.1 F (36.7 C)  TempSrc:  Oral  Oral  SpO2: 99% 99% 97% 97%  Weight:      Height:        Examination:  General exam: Elderly female, small built and frail, lying comfortably propped up in bed.  Does not appear in any distress. Respiratory system: Clear to auscultation. Respiratory effort normal. Cardiovascular system: S1 & S2 heard, RRR. No JVD, murmurs, rubs, gallops or clicks. No pedal edema.  Telemetry personally reviewed: V paced rhythm. Gastrointestinal system: Abdomen is nondistended, soft and mild suprapubic tenderness without rigidity, guarding or rebound. No organomegaly or masses felt. Normal bowel sounds heard. Central nervous system: Alert and oriented x2. No focal neurological deficits. Neck: Supple without stiffness. Extremities: Symmetric 5 x 5 power. Skin: No rashes, lesions or ulcers.  No temporal artery tenderness. Psychiatry: Judgement and insight appear impaired. Mood & affect anxious.     Data Reviewed: I have personally reviewed following labs and imaging studies  CBC: Recent Labs  Lab 04/06/18 2236  WBC 6.3  NEUTROABS 4.1  HGB 12.7  HCT 36.3  MCV 85.8  PLT 626   Basic Metabolic Panel: Recent Labs  Lab 04/06/18 2236  NA 131*  K 3.8  CL 94*  CO2 26  GLUCOSE 105*  BUN 6  CREATININE 0.36*  CALCIUM 8.8*   Liver Function Tests: Recent Labs  Lab 04/06/18 2236  AST 42*  ALT 37  ALKPHOS 58  BILITOT 0.7  PROT 6.1*  ALBUMIN 4.0   Coagulation Profile: Recent Labs  Lab 04/07/18 1035  INR 1.08   Cardiac Enzymes: Recent Labs  Lab 04/06/18 2236  TROPONINI <0.03     Recent Results (from the past 240 hour(s))  MRSA PCR Screening     Status: None   Collection Time: 04/07/18  6:17 AM  Result Value Ref Range Status   MRSA by PCR NEGATIVE NEGATIVE Final    Comment:        The GeneXpert MRSA Assay (FDA approved for NASAL specimens only), is one component of a comprehensive  MRSA colonization surveillance program. It is not intended to diagnose MRSA infection nor to guide or monitor treatment for MRSA infections. Performed at [facility], [place] [address] [place], [place] [address]          Radiology Studies: Dg Chest 2 View  Result Date: 04/07/2018 CLINICAL DATA:  Chest pain EXAM: CHEST - 2 VIEW COMPARISON:  02/17/2018 FINDINGS: Left-sided pacing device as before. Diffuse coarse chronic interstitial opacity. Atelectasis at the left base. Cardiomegaly with aortic atherosclerosis. No pneumothorax. Kyphosis of the spine with multiple compression deformities as before. Small hiatal hernia. IMPRESSION: 1. Streaky atelectasis at the left base 2. Cardiomegaly Electronically Signed   By: Donavan Foil M.D.   On: 04/07/2018 01:41   Ct Head Wo Contrast  Result Date: 04/07/2018 CLINICAL DATA:  Severe headache EXAM: CT HEAD WITHOUT CONTRAST TECHNIQUE: Contiguous axial images were obtained from the base of the skull through the vertex without intravenous contrast. COMPARISON:  03/31/2018, 11/06/2016 FINDINGS: Brain: No acute territorial infarction, hemorrhage or intracranial mass. Stable atrophy and small vessel ischemic changes of the white matter. Stable ventricle size Vascular: No hyperdense  vessels.  Carotid vascular calcification Skull: No fracture Sinuses/Orbits: No acute finding. Other: None IMPRESSION: 1. No CT evidence for acute intracranial abnormality. 2. Atrophy and small vessel ischemic changes of the white matter Electronically Signed   By: Donavan Foil M.D.   On: 04/07/2018 01:44   Ct Pelvis Wo Contrast  Result Date: 04/07/2018 INDICATION: Patient presented to the emergency department with chief complaint of headache however given concern of constipation, abdominal CT was obtained demonstrating a questionable abscess within the right mid hemiabdomen. Clinically, the patient denies abdominal pain and is afebrile with normal white blood cell  count. Please perform CT-guided aspiration and/or drainage catheter placement. EXAM: CT PELVIS WITHOUT CONTRAST COMPARISON:  CT abdomen pelvis-earlier same day MEDICATIONS: The patient is currently admitted to the hospital and receiving intravenous antibiotics. The antibiotics were administered within an appropriate time frame prior to the initiation of the procedure. ANESTHESIA/SEDATION: None CONTRAST:  None COMPLICATIONS: None immediate. PROCEDURE: Informed written consent was obtained from the patient and the patient's son after a discussion of the risks, benefits and alternatives to treatment. The patient was placed supine on the CT gantry and a pre procedural CT was obtained, including the acquisition coronal and sagittal reformatted images. The questioned air and fluid collection along the right mid hemiabdomen is no longer identified, rather there is a patulous prominently air-filled loop of intestines at this location. As such, CT-guided aspiration and/or drainage catheter placement was not attempted. IMPRESSION: Questioned air-fluid level within the right mid hemiabdomen now favored to represent a patulous loop of distal small bowel and NOT a discrete air and fluid collection. No procedure attempted. Above findings discussed with Saverio Danker at the time of procedure cancellation. Electronically Signed   By: Sandi Mariscal M.D.   On: 04/07/2018 13:12   Ct Abdomen Pelvis W Contrast  Result Date: 04/07/2018 CLINICAL DATA:  Abdominal bloating and distension EXAM: CT ABDOMEN AND PELVIS WITH CONTRAST TECHNIQUE: Multidetector CT imaging of the abdomen and pelvis was performed using the standard protocol following bolus administration of intravenous contrast. CONTRAST:  159m ISOVUE-300 IOPAMIDOL (ISOVUE-300) INJECTION 61% COMPARISON:  Head CT 10/12/2016 FINDINGS: Lower chest: Lung bases demonstrate stable 2.6 cm soft tissue density in the lingula. Smaller stellate soft tissue density 17 mm in the right middle  lobe, no change since 2017 PET-CT and therefore likely benign. Cardiomegaly with partially visualized cardiac pacing leads. Hepatobiliary: No calcified gallstone or biliary dilatation. No focal hepatic abnormality. Pancreas: Unremarkable. No pancreatic ductal dilatation or surrounding inflammatory changes. Spleen: Normal in size without focal abnormality. Adrenals/Urinary Tract: Adrenal glands are within normal limits. Exophytic cyst upper pole right kidney. No hydronephrosis. Markedly distended urinary bladder Stomach/Bowel: Stomach nonenlarged. No dilated small bowel. No colon wall thickening. Appendix not confidently identified. Vascular/Lymphatic: Moderate to marked aortic atherosclerosis. No aneurysmal dilatation. No significantly enlarged lymph nodes Reproductive: Uterus and bilateral adnexa are unremarkable. Other: Small amount of free fluid within the abdomen and pelvis. Featureless fluid and gas collection in the right abdomen, best seen on coronal views, series 5, image number 27, measuring 8.9 x 2.4 cm, appears to be paralleling the adjacent collapsed colon. Musculoskeletal: Scoliosis and degenerative changes of the spine. No acute or suspicious abnormality IMPRESSION: 1. Featureless gas and fluid collection in the right mid abdomen, best seen on coronal views, appears to be paralleling the collapsed adjacent colon, appearance on the coronal views suggests possible extraluminal collection as may be seen with organizing abscess or occult/contained perforation. Of note, there is no significant bowel wall  thickening identified within the abdomen and the appendix is not confidently identified. 2. Small amount of ascites within the abdomen 3. No evidence for a bowel obstruction 4. Marked distension of the urinary bladder 5. Stable lingular and right middle lobe soft tissue densities. Critical Value/emergent results were called by telephone at the time of interpretation on 04/07/2018 at 2:20 am to Dr. Dina Rich,  who verbally acknowledged these results. Electronically Signed   By: Donavan Foil M.D.   On: 04/07/2018 02:21        Scheduled Meds: . atorvastatin  5 mg Oral q1800  . docusate sodium  100 mg Oral Daily  . loratadine  10 mg Oral Daily  . multivitamin with minerals  1 tablet Oral Daily   Continuous Infusions: . sodium chloride 50 mL/hr at 04/07/18 0533     LOS: 0 days     Vernell Leep, MD, FACP, [facility]. [facility] Pager [phone]  If 7PM-7AM, please contact night-coverage [email or web address] Password [facility] 04/07/2018, 2:27 PM

## 2018-04-07 NOTE — Plan of Care (Signed)
EDP wants obs for intractable headache.  Also has incidental fluid collection in abdomen: no abd pain, no fever, no WBC, and normal ESR (probably not a perf). 

## 2018-04-07 NOTE — Procedures (Signed)
Pre procedural non-contrast imaging was negative from the presence of an intra-abdominal abscess.  Previously questioned air and fluid collection along the right pericolic gutter is now favored to represent a patulous loop of intestines and NOT an air and fluid collection.  As such, NO procedure was attempted.  D/W Saverio Danker following the cancellation of the procedure.    Ronny Bacon, MD Pager #: [phone]

## 2018-04-07 NOTE — Progress Notes (Signed)
Patient arrived to room 1501 from Med [facility] via EMS. Patient is Alert and Oriented but very HOH. Reports son has her clothing, both dentures and both hearing aids. Patient has right AC SL in placed, site flushed. Patient has O2 at 1L Via nasal cannula. Patient skin intact. Assisted to [facility] x1 assist, tolerated well. Has some shortness of breath with dyspnea. Patient oriented to room, call bed, bed use. Will ct monitor.

## 2018-04-07 NOTE — H&P (Addendum)
History and Physical    Tracey Holt WNI:[id number] DOB: [birth year] DOA: 04/06/2018  PCP: Mackie Pai, PA-C  Patient coming from: Home  I have personally briefly reviewed patient's old medical records in [place]  Chief Complaint: Headache  HPI: Tracey Holt is a 84 y.o. female with medical history significant of PPM following bradycardic cardiac arrest in 2017, chronic constipation.  Patient presents to the ED at [facility] for c/o severe headache.  Seen a few days ago for same but says headache is worsening.  Took tylenol yesterday AM with relief, then took tylenol again when headache reoccurred without relief prompting her to go to [facility].  No blurry vision, dizziness, weakness nor numbness of extremities.   ED Course: CT head negative, ESR neg.  Nl WBC, no SIRS, no fever.  CT abd/pelvis performed for questionable abdominal tenderness showing a fluid collection ? perf.   Review of Systems: As per HPI otherwise 10 point review of systems negative.   Past Medical History:  Diagnosis Date  . Bradycardic cardiac arrest ([place]) 07/24/2016   Required CPR, intubation with multiple rib fractures. Had short term cardiac shock with acute heart failure.  Resulted in possible stress-induced cardiomyopathy  . Cardiac related syncope 07/24/2016   Bradycardic arrest requiring CPR 2 with transvenous pacemaker placed followed by permanent pacemaker; complicated by respiratory arrest, type II MI, stress-induced cardiopathy  . Cardiomyopathy ([place]) 07/2016   Moderate severely reduced EF with apical hypokinesis suggestive of stress-induced cardiac myopathy versus multivessel CAD --> in setting of her to cardiac arrest  . COPD (chronic obstructive pulmonary disease) ([place])    By report  . History of chronic constipation   . Hypercholesterolemia   . Osteoporosis   . Pacemaker 07/26/2016   Abbott dual-chamber pacemaker: [employer] #[id number] - Serial [id number]  . Pneumonia 08/2016  .  Sarcoidosis of lung ([place])   . Spinal stenosis     Past Surgical History:  Procedure Laterality Date  . ADENOIDECTOMY    . PACEMAKER INSERTION Left 07/26/2016   Abbott Assurity Model #[id number] - Serial #[id number] -- complicated by pneumothorax requiring chest tube placement  . TEE WITHOUT CARDIOVERSION  02/2013   EF 55-60% with normal global function. No thrombus, mass or vegetation. Trivial-small pericardial effusion. Moderate LA dilation. Mild MR.  . TONSILLECTOMY    . TRANSTHORACIC ECHOCARDIOGRAM  07/24/2016   [facility]: EF 45-50%. Mid and apical inferior, inferolateral and apical hypokinesis. GR 2 DD. Mild LA and moderate RA dilation. Mild paracardial effusion. Moderate to severe TR. Basal and mid segment hypokinesis with apical hypokinesis. - Suspect stress-induced cardiopathy versus multivessel CAD.  Marland Kitchen TRANSTHORACIC ECHOCARDIOGRAM  07/26/2016   [place]: LIMITED (post PPM) - although the report suggested EF 30-35% with apical akinesis, rounding note suggested this was an improvement     reports that she quit smoking about 34 years ago. Her smoking use included cigarettes. She started smoking about 54 years ago. She has a 25.50 pack-year smoking history. She has never used smokeless tobacco. She reports that she does not drink alcohol or use drugs.  Allergies  Allergen Reactions  . Dust Mite Extract Shortness Of Breath  . Lidex [Fluocinonide] Swelling    Caused lip swelling  . Molds & Smuts Shortness Of Breath  . Lactose   . Levaquin [Levofloxacin In D5w]     Couldn't breath   . Penicillins     Arms swelling   . Rifampin     Couldn't breath  Family History  Problem Relation Age of Onset  . Allergic rhinitis Neg Hx   . Angioedema Neg Hx   . Asthma Neg Hx   . Eczema Neg Hx   . Immunodeficiency Neg Hx   . Urticaria Neg Hx      Prior to Admission medications   Medication Sig Start Date End Date Taking? Authorizing  Provider  Ascorbic Acid (VITAMIN C) 1000 MG tablet Take 1,000 mg by mouth daily.   Yes [provider]  atorvastatin (LIPITOR) 10 MG tablet Take 1/2 tablet (72m) by mouth daily 03/08/17  Yes Copland, JGay Filler MD  albuterol (PROVENTIL HFA;VENTOLIN HFA) 108 (90 Base) MCG/ACT inhaler Inhale 2 puffs into the lungs every 6 (six) hours as needed for wheezing or shortness of breath. 01/02/18   Saguier, EPercell Miller PA-C  azelastine (ASTELIN) 0.1 % nasal spray Place 1 spray into both nostrils 2 (two) times daily. Use in each nostril as directed 01/08/18   Saguier, EPercell Miller PA-C  Biotin 10000 MCG TABS Take 1 capsule by mouth daily.    [provider]  Calcium Citrate-Vitamin D (CITRACAL + D PO) Take 1 tablet by mouth 2 (two) times daily.    [provider]  ciclopirox (PENLAC) 8 % solution Apply topically at bedtime. Apply over nail and surrounding skin. Apply daily over previous coat. After seven (7) days, may remove with alcohol and continue cycle. 12/04/17   Saguier, EPercell Miller PA-C  docusate sodium (COLACE) 100 MG capsule Take 100 mg by mouth daily.    [provider]  ipratropium (ATROVENT HFA) 17 MCG/ACT inhaler Inhale 2 puffs into the lungs every 6 (six) hours as needed for wheezing. 01/08/18   Saguier, EPercell Miller PA-C  ketoconazole (NIZORAL) 2 % cream Apply 1 application topically daily. 12/04/17   Saguier, EPercell Miller PA-C  levocetirizine (XYZAL) 5 MG tablet TAKE 1 TABLET BY MOUTH EVERY DAY IN THE EVENING 03/17/18   Saguier, EPercell Miller PA-C  loratadine (CLARITIN) 10 MG tablet Take 10 mg by mouth daily.    [provider]  LUTEIN PO Take 1 tablet by mouth daily.    [provider]  Misc Natural Products (GLUCOS-CHONDROIT-MSM COMPLEX) TABS Take 2 tablets by mouth daily.    [provider]  Multiple Vitamins-Minerals (MULTIVITAMIN WITH MINERALS) tablet Take 1 tablet by mouth daily.    [provider]  mupirocin ointment (BACTROBAN) 2 % Apply to tip of nose  twice daily 12/04/17   Saguier, EPercell Miller PA-C  Probiotic CAPS Take 1 capsule by mouth daily.    [provider]    Physical Exam: Vitals:   04/07/18 0200 04/07/18 0237 04/07/18 0240 04/07/18 0450  BP: (!) 156/83 (!) 161/84 (!) 154/84 (!) 150/85  Pulse: 70 72 70 71  Resp: (!) 21 (!) _0 Temp:    97.6 F (36.4 C)  TempSrc:    Oral  SpO2: 99% 100% 99% 99%  Weight:      Height:        Constitutional: NAD, calm, comfortable Eyes: PERRL, lids and conjunctivae normal ENMT: Mucous membranes are moist. Posterior pharynx clear of any exudate or lesions.Normal dentition.  Neck: normal, supple, no masses, no thyromegaly Respiratory: clear to auscultation bilaterally, no wheezing, no crackles. Normal respiratory effort. No accessory muscle use.  Cardiovascular: Regular rate and rhythm, no murmurs / rubs / gallops. No extremity edema. 2+ pedal pulses. No carotid bruits.  Abdomen: Soft, non-tender, no guarding, no rebound, mild distention Musculoskeletal: no clubbing / cyanosis. No joint deformity  upper and lower extremities. Good ROM, no contractures. Normal muscle tone.  Skin: no rashes, lesions, ulcers. No induration Neurologic: CN 2-12 grossly intact. Sensation intact, DTR normal. Strength 5/5 in all 4.  Psychiatric: Normal judgment and insight. Alert and oriented x 3. Normal mood.    Labs on Admission: I have personally reviewed following labs and imaging studies  CBC: Recent Labs  Lab 04/06/18 2236  WBC 6.3  NEUTROABS 4.1  HGB 12.7  HCT 36.3  MCV 85.8  PLT 161   Basic Metabolic Panel: Recent Labs  Lab 04/06/18 2236  NA 131*  K 3.8  CL 94*  CO2 26  GLUCOSE 105*  BUN 6  CREATININE 0.36*  CALCIUM 8.8*   GFR: Estimated Creatinine Clearance: 28.4 mL/min (A) (by C-G formula based on SCr of 0.36 mg/dL (L)). Liver Function Tests: Recent Labs  Lab 04/06/18 2236  AST 42*  ALT 37  ALKPHOS 58  BILITOT 0.7  PROT 6.1*  ALBUMIN 4.0   Recent Labs  Lab  04/06/18 2236  LIPASE 28   No results for input(s): AMMONIA in the last 168 hours. Coagulation Profile: No results for input(s): INR, PROTIME in the last 168 hours. Cardiac Enzymes: Recent Labs  Lab 04/06/18 2236  TROPONINI <0.03   BNP (last 3 results) Recent Labs    01/02/18 1443  PROBNP 76.0   HbA1C: No results for input(s): HGBA1C in the last 72 hours. CBG: No results for input(s): GLUCAP in the last 168 hours. Lipid Profile: No results for input(s): CHOL, HDL, LDLCALC, TRIG, CHOLHDL, LDLDIRECT in the last 72 hours. Thyroid Function Tests: No results for input(s): TSH, T4TOTAL, FREET4, T3FREE, THYROIDAB in the last 72 hours. Anemia Panel: No results for input(s): VITAMINB12, FOLATE, FERRITIN, TIBC, IRON, RETICCTPCT in the last 72 hours. Urine analysis:    Component Value Date/Time   COLORURINE YELLOW 04/06/2018 2359   APPEARANCEUR CLEAR 04/06/2018 2359   LABSPEC 1.010 04/06/2018 2359   PHURINE 7.5 04/06/2018 2359   GLUCOSEU NEGATIVE 04/06/2018 2359   HGBUR NEGATIVE 04/06/2018 2359   BILIRUBINUR NEGATIVE 04/06/2018 2359   BILIRUBINUR negative 10/23/2017 1607   KETONESUR NEGATIVE 04/06/2018 2359   PROTEINUR NEGATIVE 04/06/2018 2359   UROBILINOGEN 0.2 10/23/2017 1607   NITRITE NEGATIVE 04/06/2018 2359   LEUKOCYTESUR NEGATIVE 04/06/2018 2359    Radiological Exams on Admission: Dg Chest 2 View  Result Date: 04/07/2018 CLINICAL DATA:  Chest pain EXAM: CHEST - 2 VIEW COMPARISON:  02/17/2018 FINDINGS: Left-sided pacing device as before. Diffuse coarse chronic interstitial opacity. Atelectasis at the left base. Cardiomegaly with aortic atherosclerosis. No pneumothorax. Kyphosis of the spine with multiple compression deformities as before. Small hiatal hernia. IMPRESSION: 1. Streaky atelectasis at the left base 2. Cardiomegaly Electronically Signed   By: Donavan Foil M.D.   On: 04/07/2018 01:41   Ct Head Wo Contrast  Result Date: 04/07/2018 CLINICAL DATA:  Severe  headache EXAM: CT HEAD WITHOUT CONTRAST TECHNIQUE: Contiguous axial images were obtained from the base of the skull through the vertex without intravenous contrast. COMPARISON:  03/31/2018, 11/06/2016 FINDINGS: Brain: No acute territorial infarction, hemorrhage or intracranial mass. Stable atrophy and small vessel ischemic changes of the white matter. Stable ventricle size Vascular: No hyperdense vessels.  Carotid vascular calcification Skull: No fracture Sinuses/Orbits: No acute finding. Other: None IMPRESSION: 1. No CT evidence for acute intracranial abnormality. 2. Atrophy and small vessel ischemic changes of the white matter Electronically Signed   By: Donavan Foil M.D.   On: 04/07/2018 01:44  Ct Abdomen Pelvis W Contrast  Result Date: 04/07/2018 CLINICAL DATA:  Abdominal bloating and distension EXAM: CT ABDOMEN AND PELVIS WITH CONTRAST TECHNIQUE: Multidetector CT imaging of the abdomen and pelvis was performed using the standard protocol following bolus administration of intravenous contrast. CONTRAST:  141m ISOVUE-300 IOPAMIDOL (ISOVUE-300) INJECTION 61% COMPARISON:  Head CT 10/12/2016 FINDINGS: Lower chest: Lung bases demonstrate stable 2.6 cm soft tissue density in the lingula. Smaller stellate soft tissue density 17 mm in the right middle lobe, no change since 2017 PET-CT and therefore likely benign. Cardiomegaly with partially visualized cardiac pacing leads. Hepatobiliary: No calcified gallstone or biliary dilatation. No focal hepatic abnormality. Pancreas: Unremarkable. No pancreatic ductal dilatation or surrounding inflammatory changes. Spleen: Normal in size without focal abnormality. Adrenals/Urinary Tract: Adrenal glands are within normal limits. Exophytic cyst upper pole right kidney. No hydronephrosis. Markedly distended urinary bladder Stomach/Bowel: Stomach nonenlarged. No dilated small bowel. No colon wall thickening. Appendix not confidently identified. Vascular/Lymphatic: Moderate to  marked aortic atherosclerosis. No aneurysmal dilatation. No significantly enlarged lymph nodes Reproductive: Uterus and bilateral adnexa are unremarkable. Other: Small amount of free fluid within the abdomen and pelvis. Featureless fluid and gas collection in the right abdomen, best seen on coronal views, series 5, image number 27, measuring 8.9 x 2.4 cm, appears to be paralleling the adjacent collapsed colon. Musculoskeletal: Scoliosis and degenerative changes of the spine. No acute or suspicious abnormality IMPRESSION: 1. Featureless gas and fluid collection in the right mid abdomen, best seen on coronal views, appears to be paralleling the collapsed adjacent colon, appearance on the coronal views suggests possible extraluminal collection as may be seen with organizing abscess or occult/contained perforation. Of note, there is no significant bowel wall thickening identified within the abdomen and the appendix is not confidently identified. 2. Small amount of ascites within the abdomen 3. No evidence for a bowel obstruction 4. Marked distension of the urinary bladder 5. Stable lingular and right middle lobe soft tissue densities. Critical Value/emergent results were called by telephone at the time of interpretation on 04/07/2018 at 2:20 am to Dr. HDina Rich who verbally acknowledged these results. Electronically Signed   By: KDonavan FoilM.D.   On: 04/07/2018 02:21    EKG: Independently reviewed.  Assessment/Plan Principal Problem:   Intractable headache Active Problems:   S/P placement of cardiac pacemaker   Abnormal CT of the abdomen    1. Intractable headache - 1. Possibly dehydration?  Vs HTN headache 2. ESR nl, no anorexia, no fever, no wt loss - GCA seems unlikely 1. Will check CRP 3. IVF: NS at 50 cc/hr 4. Tylenol 5. Aleve if tylenol ineffective 6. If persists consider MRI, has PPM but MRI compatible probably: model '2272 Assurity MRI', leads are 'Tendril MRI [id number] 2. Abnormal fluid  collection in abdomen - 1. Doesn't clinically correlate with abscess given no fever, no WBC, no ESR elevation, and on my exam no tenderness. 2. Will run the findings by general surgery to see what they think. 1. Dr. BNinfa Lindensays he will sign it out to person coming on at 7. 3. Will keep her NPO just in case. 3. Elevated blood pressure - 1. Add PRN hydralazine if needed 4. H/o CHF - post arrest in 2017 but return to EF 50-55% in 2018 echo  DVT prophylaxis: SCDs for now till surgery clears patient Code Status: Full Family Communication: No family in room Disposition Plan: Home after admit Consults called: Dr. BNinfa LindenAdmission [place]: [place]  DO [facility] Pager [phone]  If 7AM-7PM, please contact day team taking care of patient [email or web address] Password [email or web address]  04/07/2018, 5:15 AM

## 2018-04-07 NOTE — Consult Note (Signed)
Tracey Holt 1922-09-14  [id number].    Requesting MD: Dr. Jennette Kettle  Chief Complaint/Reason for Consult: RMQ fluid collection  HPI:  This is a 84 yo female who lives at [facility] in [place] living who has been having a HA for the last week or so.  She apparently told the nurse a grasshopper landed on her head and injected poison and that's what's causing her headache; however, she does not tell me this story.  She just asks why she has the headache.  She came to the ED for evaluation of this HA.  She had a CT scan that was negative.  She was apparently slightly tender on abdominal exam and despite no history to go with this discomfort a CT scan was ordered which revealed a RMQ fluid collection with air present.  She denies any abdominal pain, just peeing frequently.  She denies N/V.  She admits to a lot of constipation for which she takes colace everyday for.  No history of diverticulitis or abdominal surgery per patient.  She has been eating well and has no other abdominal complaints.  We have been asked to evaluate the patient for CT scan findings.   ROS: ROS  Family History  Problem Relation Age of Onset  . Allergic rhinitis Neg Hx   . Angioedema Neg Hx   . Asthma Neg Hx   . Eczema Neg Hx   . Immunodeficiency Neg Hx   . Urticaria Neg Hx     Past Medical History:  Diagnosis Date  . Bradycardic cardiac arrest ([place]) 07/24/2016   Required CPR, intubation with multiple rib fractures. Had short term cardiac shock with acute heart failure.  Resulted in possible stress-induced cardiomyopathy  . Cardiac related syncope 07/24/2016   Bradycardic arrest requiring CPR 2 with transvenous pacemaker placed followed by permanent pacemaker; complicated by respiratory arrest, type II MI, stress-induced cardiopathy  . Cardiomyopathy ([place]) 07/2016   Moderate severely reduced EF with apical hypokinesis suggestive of stress-induced cardiac myopathy versus multivessel CAD --> in  setting of her to cardiac arrest  . COPD (chronic obstructive pulmonary disease) ([place])    By report  . History of chronic constipation   . Hypercholesterolemia   . Osteoporosis   . Pacemaker 07/26/2016   Abbott dual-chamber pacemaker: [place] #[id number] - Serial [id number]  . Pneumonia 08/2016  . Sarcoidosis of lung ([place])   . Spinal stenosis     Past Surgical History:  Procedure Laterality Date  . ADENOIDECTOMY    . PACEMAKER INSERTION Left 07/26/2016   Abbott Assurity Model #[id number] - Serial #[id number] -- complicated by pneumothorax requiring chest tube placement  . TEE WITHOUT CARDIOVERSION  02/2013   EF 55-60% with normal global function. No thrombus, mass or vegetation. Trivial-small pericardial effusion. Moderate LA dilation. Mild MR.  . TONSILLECTOMY    . TRANSTHORACIC ECHOCARDIOGRAM  07/24/2016   [facility]: EF 45-50%. Mid and apical inferior, inferolateral and apical hypokinesis. GR 2 DD. Mild LA and moderate RA dilation. Mild paracardial effusion. Moderate to severe TR. Basal and mid segment hypokinesis with apical hypokinesis. - Suspect stress-induced cardiopathy versus multivessel CAD.  [place] Kitchen TRANSTHORACIC ECHOCARDIOGRAM  07/26/2016   [facility]: LIMITED (post PPM) - although the report suggested EF 30-35% with apical akinesis, rounding note suggested this was an improvement    Social History:  reports that she quit smoking about 34 years ago. Her smoking use included cigarettes. She started smoking about 54 years  ago. She has a 25.50 pack-year smoking history. She has never used smokeless tobacco. She reports that she does not drink alcohol or use drugs.  Allergies:  Allergies  Allergen Reactions  . Dust Mite Extract Shortness Of Breath  . Lidex [Fluocinonide] Swelling    Caused lip swelling  . Molds & Smuts Shortness Of Breath  . Lactose   . Levaquin [Levofloxacin In D5w]     Couldn't breath   . Penicillins      Arms swelling   . Rifampin     Couldn't breath     Medications Prior to Admission  Medication Sig Dispense Refill  . acetaminophen (TYLENOL) 500 MG tablet Take 500-1,000 mg by mouth every 6 (six) hours as needed for moderate pain or headache.    . Ascorbic Acid (VITAMIN C) 1000 MG tablet Take 1,000 mg by mouth daily.    [place] Kitchen atorvastatin (LIPITOR) 10 MG tablet Take 1/2 tablet (93m) by mouth daily 45 tablet 3  . B Complex Vitamins (VITAMIN B COMPLEX) TABS Take by mouth.    . Biotin 10000 MCG TABS Take 10,000 mcg by mouth daily.     . Calcium Citrate-Vitamin D (CITRACAL + D PO) Take 1 tablet by mouth 2 (two) times daily.    . ciclopirox (PENLAC) 8 % solution Apply topically at bedtime. Apply over nail and surrounding skin. Apply daily over previous coat. After seven (7) days, may remove with alcohol and continue cycle. (Patient taking differently: Apply 1 application topically at bedtime. Apply over nail and surrounding skin. Apply daily over previous coat. After seven (7) days, may remove with alcohol and continue cycle.) 6.6 mL 0  . Coenzyme Q10 (CO Q 10) 100 MG CAPS Take 100 mg by mouth daily.    .[place] Kitchendocusate sodium (COLACE) 100 MG capsule Take 100 mg by mouth daily.    . fluticasone (FLONASE) 50 MCG/ACT nasal spray Place 1 spray into both nostrils daily.    . hydroxypropyl methylcellulose / hypromellose (ISOPTO TEARS / GONIOVISC) 2.5 % ophthalmic solution Place 1 drop into both eyes 3 (three) times daily as needed for dry eyes.    .[place] Kitchenlevocetirizine (XYZAL) 5 MG tablet TAKE 1 TABLET BY MOUTH EVERY DAY IN THE EVENING 30 tablet 0  . loratadine (CLARITIN) 10 MG tablet Take 10 mg by mouth daily.    . LUTEIN PO Take 1 tablet by mouth daily.    . Misc Natural Products (GLUCOS-CHONDROIT-MSM COMPLEX) TABS Take 2 tablets by mouth daily.    . Multiple Vitamins-Minerals (MULTIVITAMIN WITH MINERALS) tablet Take 1 tablet by mouth daily.    . mupirocin ointment (BACTROBAN) 2 % Apply to tip of nose twice daily  (Patient taking differently: Apply 1 application topically 2 (two) times daily as needed. Wound Care) 22 g 0  . Probiotic CAPS Take 1 capsule by mouth daily.    .[place] Kitchenalbuterol (PROVENTIL HFA;VENTOLIN HFA) 108 (90 Base) MCG/ACT inhaler Inhale 2 puffs into the lungs every 6 (six) hours as needed for wheezing or shortness of breath. 1 Inhaler 2  . azelastine (ASTELIN) 0.1 % nasal spray Place 1 spray into both nostrils 2 (two) times daily. Use in each nostril as directed 30 mL 0  . ipratropium (ATROVENT HFA) 17 MCG/ACT inhaler Inhale 2 puffs into the lungs every 6 (six) hours as needed for wheezing. 1 Inhaler 2  . ketoconazole (NIZORAL) 2 % cream Apply 1 application topically daily. (Patient not taking: Reported on 04/07/2018) 15 g 1     Physical  Exam: Blood pressure (!) 150/85, pulse 71, temperature 97.6 F (36.4 C), temperature source Oral, resp. rate 18, height 4' 10"  (1.473 m), weight 43.5 kg (96 lb), SpO2 99 %. General: elderly, spunky, but frail-appearing white female who is laying in bed in NAD HEENT: head is normocephalic, atraumatic.  Sclera are noninjected.  PERRL.  Ears and nose without any masses or lesions.  Mouth is pink and dry Heart: regular, rate, and rhythm.  Normal s1,s2. No obvious murmurs, gallops, or rubs noted.  Palpable radial and pedal pulses bilaterally Lungs: CTAB, no wheezes, rhonchi, or rales noted.  Respiratory effort nonlabored Abd: soft, NT, except over bladder, ND, +BS, no masses, hernias, or organomegaly MS: all 4 extremities are symmetrical with no cyanosis, clubbing, or edema. Skin: warm and dry with no masses, lesions, or rashes Psych: A&Ox3 but despite being oriented, she does repeat her questions quite frequently suggesting some forgetfulness.    Results for orders placed or performed during the hospital encounter of 04/06/18 (from the past 48 hour(s))  Comprehensive metabolic panel     Status: Abnormal   Collection Time: 04/06/18 10:36 PM  Result Value Ref  Range   Sodium 131 (L) 135 - 145 mmol/L   Potassium 3.8 3.5 - 5.1 mmol/L   Chloride 94 (L) 101 - 111 mmol/L   CO2 26 22 - 32 mmol/L   Glucose, Bld 105 (H) 65 - 99 mg/dL   BUN 6 6 - 20 mg/dL   Creatinine, Ser 0.36 (L) 0.44 - 1.00 mg/dL   Calcium 8.8 (L) 8.9 - 10.3 mg/dL   Total Protein 6.1 (L) 6.5 - 8.1 g/dL   Albumin 4.0 3.5 - 5.0 g/dL   AST 42 (H) 15 - 41 U/L   ALT 37 14 - 54 U/L   Alkaline Phosphatase 58 38 - 126 U/L   Total Bilirubin 0.7 0.3 - 1.2 mg/dL   GFR calc non Af Amer >60 >60 mL/min   GFR calc Af Amer >60 >60 mL/min    Comment: (NOTE) The eGFR has been calculated using the CKD EPI equation. This calculation has not been validated in all clinical situations. eGFR's persistently <60 mL/min signify possible Chronic Kidney Disease.    Anion gap 11 5 - 15    Comment: Performed at [facility], [address] [place], [place] [address]  Lipase, blood     Status: None   Collection Time: 04/06/18 10:36 PM  Result Value Ref Range   Lipase 28 11 - 51 U/L    Comment: Performed at [facility], [address] [place], [place] [address]  Troponin I     Status: None   Collection Time: 04/06/18 10:36 PM  Result Value Ref Range   Troponin I <0.03 <0.03 ng/mL    Comment: Performed at [facility], [address] [place], [place] [address]  CBC with Differential     Status: None   Collection Time: 04/06/18 10:36 PM  Result Value Ref Range   WBC 6.3 4.0 - 10.5 K/uL   RBC 4.23 3.87 - 5.11 MIL/uL   Hemoglobin 12.7 12.0 - 15.0 g/dL   HCT 36.3 36.0 - 46.0 %   MCV 85.8 78.0 - 100.0 fL   MCH 30.0 26.0 - 34.0 pg   MCHC 35.0 30.0 - 36.0 g/dL   RDW 14.7 11.5 - 15.5 %   Platelets 180 150 - 400 K/uL   Neutrophils Relative % 64 %   Neutro  Abs 4.1 1.7 - 7.7 K/uL   Lymphocytes Relative 20 %   Lymphs Abs 1.3 0.7 - 4.0 K/uL   Monocytes Relative 14 %   Monocytes Absolute 0.9 0.1 - 1.0 K/uL   Eosinophils Relative 2 %   Eosinophils Absolute 0.1 0.0 - 0.7  K/uL   Basophils Relative 0 %   Basophils Absolute 0.0 0.0 - 0.1 K/uL    Comment: Performed at [facility], [address] [place], [place] [address]  Sedimentation rate     Status: None   Collection Time: 04/06/18 10:36 PM  Result Value Ref Range   Sed Rate 14 0 - 22 mm/hr    Comment: Performed at [facility], [address] [place], [place] [address]  Urinalysis, Routine w reflex microscopic     Status: None   Collection Time: 04/06/18 11:59 PM  Result Value Ref Range   Color, Urine YELLOW YELLOW   APPearance CLEAR CLEAR   Specific Gravity, Urine 1.010 1.005 - 1.030   pH 7.5 5.0 - 8.0   Glucose, UA NEGATIVE NEGATIVE mg/dL   Hgb urine dipstick NEGATIVE NEGATIVE   Bilirubin Urine NEGATIVE NEGATIVE   Ketones, ur NEGATIVE NEGATIVE mg/dL   Protein, ur NEGATIVE NEGATIVE mg/dL   Nitrite NEGATIVE NEGATIVE   Leukocytes, UA NEGATIVE NEGATIVE    Comment: Microscopic not done on urines with negative protein, blood, leukocytes, nitrite, or glucose < 500 mg/dL. Performed at [facility], [address] [place], [place] [address]   C-reactive protein     Status: Abnormal   Collection Time: 04/07/18  5:50 AM  Result Value Ref Range   CRP 4.1 (H) <1.0 mg/dL    Comment: Performed at [facility], [place] [address], [place], [place] [address]  MRSA PCR Screening     Status: None   Collection Time: 04/07/18  6:17 AM  Result Value Ref Range   MRSA by PCR NEGATIVE NEGATIVE    Comment:        The GeneXpert MRSA Assay (FDA approved for NASAL specimens only), is one component of a comprehensive MRSA colonization surveillance program. It is not intended to diagnose MRSA infection nor to guide or monitor treatment for MRSA infections. Performed at [facility], [place] [address], [place], [place] [address]    Dg Chest 2 View  Result Date: 04/07/2018 CLINICAL DATA:  Chest pain EXAM: CHEST - 2 VIEW COMPARISON:  02/17/2018 FINDINGS:  Left-sided pacing device as before. Diffuse coarse chronic interstitial opacity. Atelectasis at the left base. Cardiomegaly with aortic atherosclerosis. No pneumothorax. Kyphosis of the spine with multiple compression deformities as before. Small hiatal hernia. IMPRESSION: 1. Streaky atelectasis at the left base 2. Cardiomegaly Electronically Signed   By: Donavan Foil M.D.   On: 04/07/2018 01:41   Ct Head Wo Contrast  Result Date: 04/07/2018 CLINICAL DATA:  Severe headache EXAM: CT HEAD WITHOUT CONTRAST TECHNIQUE: Contiguous axial images were obtained from the base of the skull through the vertex without intravenous contrast. COMPARISON:  03/31/2018, 11/06/2016 FINDINGS: Brain: No acute territorial infarction, hemorrhage or intracranial mass. Stable atrophy and small vessel ischemic changes of the white matter. Stable ventricle size Vascular: No hyperdense vessels.  Carotid vascular calcification Skull: No fracture Sinuses/Orbits: No acute finding. Other: None IMPRESSION: 1. No CT evidence for acute intracranial abnormality. 2. Atrophy and small vessel ischemic changes of the white matter Electronically Signed   By: Donavan Foil M.D.   On:  04/07/2018 01:44   Ct Abdomen Pelvis W Contrast  Result Date: 04/07/2018 CLINICAL DATA:  Abdominal bloating and distension EXAM: CT ABDOMEN AND PELVIS WITH CONTRAST TECHNIQUE: Multidetector CT imaging of the abdomen and pelvis was performed using the standard protocol following bolus administration of intravenous contrast. CONTRAST:  111m ISOVUE-300 IOPAMIDOL (ISOVUE-300) INJECTION 61% COMPARISON:  Head CT 10/12/2016 FINDINGS: Lower chest: Lung bases demonstrate stable 2.6 cm soft tissue density in the lingula. Smaller stellate soft tissue density 17 mm in the right middle lobe, no change since 2017 PET-CT and therefore likely benign. Cardiomegaly with partially visualized cardiac pacing leads. Hepatobiliary: No calcified gallstone or biliary dilatation. No focal  hepatic abnormality. Pancreas: Unremarkable. No pancreatic ductal dilatation or surrounding inflammatory changes. Spleen: Normal in size without focal abnormality. Adrenals/Urinary Tract: Adrenal glands are within normal limits. Exophytic cyst upper pole right kidney. No hydronephrosis. Markedly distended urinary bladder Stomach/Bowel: Stomach nonenlarged. No dilated small bowel. No colon wall thickening. Appendix not confidently identified. Vascular/Lymphatic: Moderate to marked aortic atherosclerosis. No aneurysmal dilatation. No significantly enlarged lymph nodes Reproductive: Uterus and bilateral adnexa are unremarkable. Other: Small amount of free fluid within the abdomen and pelvis. Featureless fluid and gas collection in the right abdomen, best seen on coronal views, series 5, image number 27, measuring 8.9 x 2.4 cm, appears to be paralleling the adjacent collapsed colon. Musculoskeletal: Scoliosis and degenerative changes of the spine. No acute or suspicious abnormality IMPRESSION: 1. Featureless gas and fluid collection in the right mid abdomen, best seen on coronal views, appears to be paralleling the collapsed adjacent colon, appearance on the coronal views suggests possible extraluminal collection as may be seen with organizing abscess or occult/contained perforation. Of note, there is no significant bowel wall thickening identified within the abdomen and the appendix is not confidently identified. 2. Small amount of ascites within the abdomen 3. No evidence for a bowel obstruction 4. Marked distension of the urinary bladder 5. Stable lingular and right middle lobe soft tissue densities. Critical Value/emergent results were called by telephone at the time of interpretation on 04/07/2018 at 2:20 am to Dr. HDina Rich who verbally acknowledged these results. Electronically Signed   By: KDonavan FoilM.D.   On: 04/07/2018 02:21      Assessment/Plan Intra-abdominal fluid collection The patient has no  abdominal complaints except for frequent urination.  Her bladder was very full on CT which makes me question some incomplete voiding with residual.  Nevertheless, she has no other abdominal pain, normal WBC, no fevers, N/V/D, etc.  Her CT scan shows this fluid collection on the right side with no definite source. This collection does have air present which makes it more concerning for infection as opposed to just a simple collection.  Because of this, we will ask IR to aspirate this collection.  If this appears infected then we would recommend a drain to be placed.  At this time, no plans for surgical intervention.  This was explained multiple times to the patient as well as her son who is present in the room during the majority of this discussion.  All questions were answered.  HA -unknown etiology COPD Cardiomyopathy Chronic constipation  FEN - NPO VTE - SCDs ID - none currently  KHenreitta Cea [facility] 04/07/2018, 9:00 AM Pager: [phone]

## 2018-04-08 DIAGNOSIS — R935 Abnormal findings on diagnostic imaging of other abdominal regions, including retroperitoneum: Secondary | ICD-10-CM | POA: Diagnosis not present

## 2018-04-08 DIAGNOSIS — R51 Headache: Secondary | ICD-10-CM | POA: Diagnosis not present

## 2018-04-08 LAB — CUP PACEART REMOTE DEVICE CHECK
Battery Remaining Longevity: 96 mo
Brady Statistic AP VP Percent: 41 %
Brady Statistic AP VS Percent: 1 %
Brady Statistic AS VP Percent: 59 %
Date Time Interrogation Session: 20190501060012
Implantable Lead Implant Date: 20170907
Implantable Lead Location: 753859
Lead Channel Impedance Value: 430 Ohm
Lead Channel Pacing Threshold Amplitude: 0.5 V
Lead Channel Pacing Threshold Pulse Width: 0.4 ms
Lead Channel Sensing Intrinsic Amplitude: 3.8 mV
Lead Channel Sensing Intrinsic Amplitude: 7.3 mV
Lead Channel Setting Pacing Amplitude: 2.5 V
Lead Channel Setting Pacing Pulse Width: 0.4 ms
MDC IDC LEAD IMPLANT DT: 20170907
MDC IDC LEAD LOCATION: 753860
MDC IDC MSMT BATTERY REMAINING PERCENTAGE: 95.5 %
MDC IDC MSMT BATTERY VOLTAGE: 2.99 V
MDC IDC MSMT LEADCHNL RA PACING THRESHOLD AMPLITUDE: 1.125 V
MDC IDC MSMT LEADCHNL RV IMPEDANCE VALUE: 410 Ohm
MDC IDC MSMT LEADCHNL RV PACING THRESHOLD PULSEWIDTH: 0.4 ms
MDC IDC PG IMPLANT DT: 20170907
MDC IDC SET LEADCHNL RV PACING AMPLITUDE: 2.5 V
MDC IDC SET LEADCHNL RV SENSING SENSITIVITY: 2 mV
MDC IDC STAT BRADY AS VS PERCENT: 1 %
MDC IDC STAT BRADY RA PERCENT PACED: 41 %
MDC IDC STAT BRADY RV PERCENT PACED: 99 %
Pulse Gen Model: 2272
Pulse Gen Serial Number: 7944486

## 2018-04-08 LAB — BASIC METABOLIC PANEL
Anion gap: 10 (ref 5–15)
BUN: 6 mg/dL (ref 6–20)
CO2: 26 mmol/L (ref 22–32)
CREATININE: 0.38 mg/dL — AB (ref 0.44–1.00)
Calcium: 9.4 mg/dL (ref 8.9–10.3)
Chloride: 101 mmol/L (ref 101–111)
GFR calc Af Amer: >60 mL/min (ref >60)
Glucose, Bld: 97 mg/dL (ref 65–99)
POTASSIUM: 3.6 mmol/L (ref 3.5–5.1)
SODIUM: 137 mmol/L (ref 135–145)

## 2018-04-08 MED ORDER — MUPIROCIN 2 % EX OINT: 1.0000 "application " | TOPICAL_OINTMENT | Freq: Two times a day (BID) | CUTANEOUS | Status: DC | PRN

## 2018-04-08 NOTE — Discharge Instructions (Signed)
Please get your medications reviewed and adjusted by your Primary MD. ° °Please request your Primary MD to go over all Hospital Tests and Procedure/Radiological results at the follow up, please get all Hospital records sent to your Prim MD by signing hospital release before you go home. ° °If you had Pneumonia of Lung problems at the Hospital: °Please get a 2 view Chest X ray done in 6-8 weeks after hospital discharge or sooner if instructed by your Primary MD. ° °If you have Congestive Heart Failure: °Please call your Cardiologist or Primary MD anytime you have any of the following symptoms:  °1) 3 pound weight gain in 24 hours or 5 pounds in 1 week  °2) shortness of breath, with or without a dry hacking cough  °3) swelling in the hands, feet or stomach  °4) if you have to sleep on extra pillows at night in order to breathe ° °Follow cardiac low salt diet and 1.5 lit/day fluid restriction. ° °If you have diabetes °Accuchecks 4 times/day, Once in AM empty stomach and then before each meal. °Log in all results and show them to your primary doctor at your next visit. °If any glucose reading is under 80 or above 300 call your primary MD immediately. ° °If you have Seizure/Convulsions/Epilepsy: °Please do not drive, operate heavy machinery, participate in activities at heights or participate in high speed sports until you have seen by Primary MD or a Neurologist and advised to do so again. ° °If you had Gastrointestinal Bleeding: °Please ask your Primary MD to check a complete blood count within one week of discharge or at your next visit. Your endoscopic/colonoscopic biopsies that are pending at the time of discharge, will also need to followed by your Primary MD. ° °Get Medicines reviewed and adjusted. °Please take all your medications with you for your next visit with your Primary MD ° °Please request your Primary MD to go over all hospital tests and procedure/radiological results at the follow up, please ask your  Primary MD to get all Hospital records sent to his/her office. ° °If you experience worsening of your admission symptoms, develop shortness of breath, life threatening emergency, suicidal or homicidal thoughts you must seek medical attention immediately by calling 911 or calling your MD immediately  if symptoms less severe. ° °You must read complete instructions/literature along with all the possible adverse reactions/side effects for all the Medicines you take and that have been prescribed to you. Take any new Medicines after you have completely understood and accpet all the possible adverse reactions/side effects.  ° °Do not drive or operate heavy machinery when taking Pain medications.  ° °Do not take more than prescribed Pain, Sleep and Anxiety Medications ° °Special Instructions: If you have smoked or chewed Tobacco  in the last 2 yrs please stop smoking, stop any regular Alcohol  and or any Recreational drug use. ° °Wear Seat belts while driving. ° °Please note °You were cared for by a hospitalist during your hospital stay. If you have any questions about your discharge medications or the care you received while you were in the hospital after you are discharged, you can call the unit and asked to speak with the hospitalist on call if the hospitalist that took care of you is not available. Once you are discharged, your primary care physician will handle any further medical issues. Please note that NO REFILLS for any discharge medications will be authorized once you are discharged, as it is imperative that you   return to your primary care physician (or establish a relationship with a primary care physician if you do not have one) for your aftercare needs so that they can reassess your need for medications and monitor your lab values.  You can reach the hospitalist office at phone [phone] or fax [phone]   If you do not have a primary care physician, you can call [phone] for a physician  referral.   General Headache Without Cause A headache is pain or discomfort felt around the head or neck area. The specific cause of a headache may not be found. There are many causes and types of headaches. A few common ones are:  Tension headaches.  Migraine headaches.  Cluster headaches.  Chronic daily headaches.  Follow these instructions at home: Watch your condition for any changes. Take these steps to help with your condition: Managing pain  Take over-the-counter and prescription medicines only as told by your health care provider.  Lie down in a dark, quiet room when you have a headache.  If directed, apply ice to the head and neck area: ? Put ice in a plastic bag. ? Place a towel between your skin and the bag. ? Leave the ice on for 20 minutes, 2-3 times per day.  Use a heating pad or hot shower to apply heat to the head and neck area as told by your health care provider.  Keep lights dim if bright lights bother you or make your headaches worse. Eating and drinking  Eat meals on a regular schedule.  Limit alcohol use.  Decrease the amount of caffeine you drink, or stop drinking caffeine. General instructions  Keep all follow-up visits as told by your health care provider. This is important.  Keep a headache journal to help find out what may trigger your headaches. For example, write down: ? What you eat and drink. ? How much sleep you get. ? Any change to your diet or medicines.  Try massage or other relaxation techniques.  Limit stress.  Sit up straight, and do not tense your muscles.  Do not use tobacco products, including cigarettes, chewing tobacco, or e-cigarettes. If you need help quitting, ask your health care provider.  Exercise regularly as told by your health care provider.  Sleep on a regular schedule. Get 7-9 hours of sleep, or the amount recommended by your health care provider. Contact a health care provider if:  Your symptoms are not  helped by medicine.  You have a headache that is different from the usual headache.  You have nausea or you vomit.  You have a fever. Get help right away if:  Your headache becomes severe.  You have repeated vomiting.  You have a stiff neck.  You have a loss of vision.  You have problems with speech.  You have pain in the eye or ear.  You have muscular weakness or loss of muscle control.  You lose your balance or have trouble walking.  You feel faint or pass out.  You have confusion. This information is not intended to replace advice given to you by your health care provider. Make sure you discuss any questions you have with your health care provider. Document Released: 11/05/2005 Document Revised: 04/12/2016 Document Reviewed: 02/28/2015 Elsevier Interactive Patient Education  [employer].

## 2018-04-08 NOTE — Discharge Summary (Signed)
Physician Discharge Summary  Tracey Holt PQZ:[id number] DOB: [birth year]  PCP: Mackie Pai, PA-C  Admit date: 04/06/2018 Discharge date: 04/08/2018  Recommendations for Outpatient Follow-up:  1. Mackie Pai, PA-C/PCP in 1 week with repeat labs (CBC & BMP).  Home Health: None Equipment/Devices: None  Discharge Condition: Improved and stable CODE STATUS: Full Diet recommendation: Heart healthy diet.  Discharge Diagnoses:  Principal Problem:   Intractable headache Active Problems:   S/P placement of cardiac pacemaker   Abnormal CT of the abdomen   Brief Summary: 84 year old female, resides at [facility] [place], independent, not on home oxygen, PMH of bradycardic cardiac arrest, cardiogenic shock, acute heart failure, stress-induced cardiomyopathy 07/24/2016, COPD, chronic constipation, HLD, PPM, sarcoidosis, anxiety was seen in ED 03/31/2018 for headache that began after patient noticed a Grasshopper on her forehead and was paranoid about it, CT head negative, improved with Tylenol, discharged home, presented to the ED 04/07/2018 with complaints of severe headache.  CT head, ESR negative.  CT abdomen and pelvis concerning for perforation and collection.  Admitted for observation.  General surgery consulted.   Assessment & Plan:  Headache: Unclear etiology.  CT head 5/13 and 5/20 without acute abnormality.  ESR 14.  CRP 4.1, unclear significance.  No focal deficits on exam.  No prior history of chronic headaches but does have it infrequently and intermittently.  Had her eyes checked couple of months back and told to come back in a year.  Does report some neck pain?  Cervicogenic headache.  Does not seem related to allergies/sinus disease.  Low index of suspicion for migraine or tension headache or temporal arteritis (normal ESR and no temporal artery tenderness) or viral meningitis.  Good relief with Tylenol, continue PRN.  Anxiety likely contributing.  K pad to the neck.  Adequate  hydration.  Discussed with neuro hospitalist, agrees with above and may use Naprosyn couple times a week if Tylenol alone does not help.  Has been without a headache for greater than 24 hours.  Abnormal CT abdomen: CT abdomen 5/20 were concerning for extraluminal collection in the abdomen.  General surgery was consulted and were concerned that this was a infected fluid collection and requested IR to aspirate.  However as per IR, the air and fluid collection along the right pericolic gutter is now favored to represent a patulous loop of intestine and not an air and fluid collection (based on repeat CT abdomen without contrast).  Resumed diet.    No abdominal pain reported.  Chronic constipation: Bowel regimen.  No features suggestive of bowel obstruction.  Hypertension: Mildly uncontrolled at times.  Not on medications PTA.  Some of this likely driven by anxiety.  Reassess during outpatient follow-up and consider antihypertensives if needed.  History of bradycardic cardiac arrest/CHF, likely acute systolic then/PPM: TTE 3354 showed EF 50-55%.  Stable without features of volume overload.  COPD: Stable without clinical bronchospasm.  Anxiety/suspect cognitive impairment: Possible underlying dementia.  Delirium precautions.  Minimize opioids.  Discussed in detail with patient's son today and recommend outpatient Geriatrician follow-up or Neurology follow-up to further evaluate for dementia and anxiety.  Markedly distended bladder: Seen on CT abdomen.  History of frequent urination?  Incomplete emptying.  As per discussion with patient and nursing, patient has been voiding well without difficulty or complaints.   Consultants:  General surgery Interventional radiology  Procedures:  None   Discharge Instructions  Discharge Instructions    Call MD for:  difficulty breathing, headache or visual disturbances   Complete by:  As directed    Call MD for:  extreme fatigue   Complete by:  As  directed    Call MD for:  persistant dizziness or light-headedness   Complete by:  As directed    Call MD for:  persistant nausea and vomiting   Complete by:  As directed    Call MD for:  severe uncontrolled pain   Complete by:  As directed    Call MD for:  temperature >100.4   Complete by:  As directed    Diet - low sodium heart healthy   Complete by:  As directed    Increase activity slowly   Complete by:  As directed        Medication List    STOP taking these medications   ketoconazole 2 % cream Commonly known as:  NIZORAL   levocetirizine 5 MG tablet Commonly known as:  XYZAL     TAKE these medications   acetaminophen 500 MG tablet Commonly known as:  TYLENOL Take 500-1,000 mg by mouth every 6 (six) hours as needed for moderate pain or headache.   albuterol 108 (90 Base) MCG/ACT inhaler Commonly known as:  PROVENTIL HFA;VENTOLIN HFA Inhale 2 puffs into the lungs every 6 (six) hours as needed for wheezing or shortness of breath.   atorvastatin 10 MG tablet Commonly known as:  LIPITOR Take 1/2 tablet ('5mg'$ ) by mouth daily   azelastine 0.1 % nasal spray Commonly known as:  ASTELIN Place 1 spray into both nostrils 2 (two) times daily. Use in each nostril as directed   Biotin 10000 MCG Tabs Take 10,000 mcg by mouth daily.   ciclopirox 8 % solution Commonly known as:  PENLAC Apply topically at bedtime. Apply over nail and surrounding skin. Apply daily over previous coat. After seven (7) days, may remove with alcohol and continue cycle. What changed:    how much to take  additional instructions   CITRACAL + D PO Take 1 tablet by mouth 2 (two) times daily.   Co Q 10 100 MG Caps Take 100 mg by mouth daily.   docusate sodium 100 MG capsule Commonly known as:  COLACE Take 100 mg by mouth daily.   fluticasone 50 MCG/ACT nasal spray Commonly known as:  FLONASE Place 1 spray into both nostrils daily.   GLUCOS-CHONDROIT-MSM COMPLEX Tabs Take 2 tablets by  mouth daily.   hydroxypropyl methylcellulose / hypromellose 2.5 % ophthalmic solution Commonly known as:  ISOPTO TEARS / GONIOVISC Place 1 drop into both eyes 3 (three) times daily as needed for dry eyes.   ipratropium 17 MCG/ACT inhaler Commonly known as:  ATROVENT HFA Inhale 2 puffs into the lungs every 6 (six) hours as needed for wheezing.   loratadine 10 MG tablet Commonly known as:  CLARITIN Take 10 mg by mouth daily.   LUTEIN PO Take 1 tablet by mouth daily.   multivitamin with minerals tablet Take 1 tablet by mouth daily.   mupirocin ointment 2 % Commonly known as:  BACTROBAN Apply 1 application topically 2 (two) times daily as needed. Wound Care What changed:    how much to take  how to take this  when to take this  reasons to take this  additional instructions   Probiotic Caps Take 1 capsule by mouth daily.   Vitamin B Complex Tabs Take by mouth.   vitamin C 1000 MG tablet Take 1,000 mg by mouth daily.      Follow-up Information    Saguier, Iris Pert.  Schedule an appointment as soon as possible for a visit in 1 week(s).   Specialties:  Internal Medicine, Family Medicine Why:  To be seen with repeat labs (CBC & BMP). Contact information: [address] [place] [address] [phone]          Allergies  Allergen Reactions  . Dust Mite Extract Shortness Of Breath  . Lidex [Fluocinonide] Swelling    Caused lip swelling  . Molds & Smuts Shortness Of Breath  . Lactose   . Levaquin [Levofloxacin In D5w]     Couldn't breath   . Penicillins     Arms swelling   . Rifampin     Couldn't breath       Procedures/Studies: Dg Chest 2 View  Result Date: 04/07/2018 CLINICAL DATA:  Chest pain EXAM: CHEST - 2 VIEW COMPARISON:  02/17/2018 FINDINGS: Left-sided pacing device as before. Diffuse coarse chronic interstitial opacity. Atelectasis at the left base. Cardiomegaly with aortic atherosclerosis. No pneumothorax. Kyphosis of  the spine with multiple compression deformities as before. Small hiatal hernia. IMPRESSION: 1. Streaky atelectasis at the left base 2. Cardiomegaly Electronically Signed   By: Donavan Foil M.D.   On: 04/07/2018 01:41   Ct Head Wo Contrast  Result Date: 04/07/2018 CLINICAL DATA:  Severe headache EXAM: CT HEAD WITHOUT CONTRAST TECHNIQUE: Contiguous axial images were obtained from the base of the skull through the vertex without intravenous contrast. COMPARISON:  03/31/2018, 11/06/2016 FINDINGS: Brain: No acute territorial infarction, hemorrhage or intracranial mass. Stable atrophy and small vessel ischemic changes of the white matter. Stable ventricle size Vascular: No hyperdense vessels.  Carotid vascular calcification Skull: No fracture Sinuses/Orbits: No acute finding. Other: None IMPRESSION: 1. No CT evidence for acute intracranial abnormality. 2. Atrophy and small vessel ischemic changes of the white matter Electronically Signed   By: Donavan Foil M.D.   On: 04/07/2018 01:44   Ct Head Wo Contrast  Result Date: 03/31/2018 CLINICAL DATA:  Acute onset of generalized headache. EXAM: CT HEAD WITHOUT CONTRAST TECHNIQUE: Contiguous axial images were obtained from the base of the skull through the vertex without intravenous contrast. COMPARISON:  Paranasal sinus CT performed 11/06/2016 FINDINGS: Brain: No evidence of acute infarction, hemorrhage, hydrocephalus, extra-axial collection or mass lesion / mass effect. Prominence of the ventricles and sulci reflects mild cortical volume loss. Scattered periventricular and subcortical white matter change likely reflects small vessel ischemic microangiopathy. Mild cerebellar atrophy is noted. The brainstem and fourth ventricle are within normal limits. The basal ganglia are unremarkable in appearance. The cerebral hemispheres demonstrate grossly normal gray-white differentiation. No mass effect or midline shift is seen. Vascular: No hyperdense vessel or unexpected  calcification. Skull: There is no evidence of fracture; visualized osseous structures are unremarkable in appearance. Sinuses/Orbits: The orbits are within normal limits. The paranasal sinuses and mastoid air cells are well-aerated. Other: No significant soft tissue abnormalities are seen. IMPRESSION: 1. No acute intracranial pathology seen on CT. 2. Mild cortical volume loss and scattered small vessel ischemic microangiopathy. Electronically Signed   By: Garald Balding M.D.   On: 03/31/2018 21:54   Ct Pelvis Wo Contrast  Result Date: 04/07/2018 INDICATION: Patient presented to the emergency department with chief complaint of headache however given concern of constipation, abdominal CT was obtained demonstrating a questionable abscess within the right mid hemiabdomen. Clinically, the patient denies abdominal pain and is afebrile with normal white blood cell count. Please perform CT-guided aspiration and/or drainage catheter placement. EXAM: CT PELVIS WITHOUT CONTRAST  COMPARISON:  CT abdomen pelvis-earlier same day MEDICATIONS: The patient is currently admitted to the hospital and receiving intravenous antibiotics. The antibiotics were administered within an appropriate time frame prior to the initiation of the procedure. ANESTHESIA/SEDATION: None CONTRAST:  None COMPLICATIONS: None immediate. PROCEDURE: Informed written consent was obtained from the patient and the patient's son after a discussion of the risks, benefits and alternatives to treatment. The patient was placed supine on the CT gantry and a pre procedural CT was obtained, including the acquisition coronal and sagittal reformatted images. The questioned air and fluid collection along the right mid hemiabdomen is no longer identified, rather there is a patulous prominently air-filled loop of intestines at this location. As such, CT-guided aspiration and/or drainage catheter placement was not attempted. IMPRESSION: Questioned air-fluid level within the  right mid hemiabdomen now favored to represent a patulous loop of distal small bowel and NOT a discrete air and fluid collection. No procedure attempted. Above findings discussed with Saverio Danker at the time of procedure cancellation. Electronically Signed   By: Sandi Mariscal M.D.   On: 04/07/2018 13:12   Ct Abdomen Pelvis W Contrast  Result Date: 04/07/2018 CLINICAL DATA:  Abdominal bloating and distension EXAM: CT ABDOMEN AND PELVIS WITH CONTRAST TECHNIQUE: Multidetector CT imaging of the abdomen and pelvis was performed using the standard protocol following bolus administration of intravenous contrast. CONTRAST:  173m ISOVUE-300 IOPAMIDOL (ISOVUE-300) INJECTION 61% COMPARISON:  Head CT 10/12/2016 FINDINGS: Lower chest: Lung bases demonstrate stable 2.6 cm soft tissue density in the lingula. Smaller stellate soft tissue density 17 mm in the right middle lobe, no change since 2017 PET-CT and therefore likely benign. Cardiomegaly with partially visualized cardiac pacing leads. Hepatobiliary: No calcified gallstone or biliary dilatation. No focal hepatic abnormality. Pancreas: Unremarkable. No pancreatic ductal dilatation or surrounding inflammatory changes. Spleen: Normal in size without focal abnormality. Adrenals/Urinary Tract: Adrenal glands are within normal limits. Exophytic cyst upper pole right kidney. No hydronephrosis. Markedly distended urinary bladder Stomach/Bowel: Stomach nonenlarged. No dilated small bowel. No colon wall thickening. Appendix not confidently identified. Vascular/Lymphatic: Moderate to marked aortic atherosclerosis. No aneurysmal dilatation. No significantly enlarged lymph nodes Reproductive: Uterus and bilateral adnexa are unremarkable. Other: Small amount of free fluid within the abdomen and pelvis. Featureless fluid and gas collection in the right abdomen, best seen on coronal views, series 5, image number 27, measuring 8.9 x 2.4 cm, appears to be paralleling the adjacent  collapsed colon. Musculoskeletal: Scoliosis and degenerative changes of the spine. No acute or suspicious abnormality IMPRESSION: 1. Featureless gas and fluid collection in the right mid abdomen, best seen on coronal views, appears to be paralleling the collapsed adjacent colon, appearance on the coronal views suggests possible extraluminal collection as may be seen with organizing abscess or occult/contained perforation. Of note, there is no significant bowel wall thickening identified within the abdomen and the appendix is not confidently identified. 2. Small amount of ascites within the abdomen 3. No evidence for a bowel obstruction 4. Marked distension of the urinary bladder 5. Stable lingular and right middle lobe soft tissue densities. Critical Value/emergent results were called by telephone at the time of interpretation on 04/07/2018 at 2:20 am to Dr. HDina Rich who verbally acknowledged these results. Electronically Signed   By: KDonavan FoilM.D.   On: 04/07/2018 02:21     Subjective: Patient interviewed and examined with RN in room.  Patient denies complaints and is anxious to be discharged soon.  Reports no headache >24 hours.  Tolerated diet  without nausea, vomiting or abdominal pain.  Last BM 5/19.  Passing flatus.  As per patient and RN, patient has been urinating well without difficulty.  No dysuria or urinary frequency reported.  As per RN, patient ambulated steadily with supervision.  Patient declines PT evaluation.  Discharge Exam:  Vitals:   04/07/18 1240 04/07/18 1407 04/07/18 2056 04/08/18 0611  BP: (!) 159/90 (!) 141/75 (!) 154/92 (!) 153/90  Pulse: 79 82 82 79  Resp: (!) 24  17 18   Temp:  98.1 F (36.7 C) 98.2 F (36.8 C) 98.1 F (36.7 C)  TempSrc:  Oral    SpO2: 97% 97% 96% 94%  Weight:      Height:        General exam: Elderly female, small built and frail, lying comfortably propped up in bed.  Does not appear in any distress. Respiratory system: Clear to auscultation.  Respiratory effort normal. Cardiovascular system: S1 & S2 heard, RRR. No JVD, murmurs, rubs, gallops or clicks. No pedal edema.   Telemetry personally reviewed: V paced or AV paced rhythm. Gastrointestinal system: Abdomen is nondistended, soft and nontender.  No organomegaly or masses appreciated. Normal bowel sounds heard. Central nervous system: Alert and oriented x 3. No focal neurological deficits. Neck: Supple without stiffness. Extremities: Symmetric 5 x 5 power. Skin: No rashes, lesions or ulcers.  No temporal artery tenderness. Psychiatry: Judgement and insight appear impaired. Mood & affect anxious.       The results of significant diagnostics from this hospitalization (including imaging, microbiology, ancillary and laboratory) are listed below for reference.     Microbiology: Recent Results (from the past 240 hour(s))  MRSA PCR Screening     Status: None   Collection Time: 04/07/18  6:17 AM  Result Value Ref Range Status   MRSA by PCR NEGATIVE NEGATIVE Final    Comment:        The GeneXpert MRSA Assay (FDA approved for NASAL specimens only), is one component of a comprehensive MRSA colonization surveillance program. It is not intended to diagnose MRSA infection nor to guide or monitor treatment for MRSA infections. Performed at [facility], [place] [address] [place], [place] [address]      Labs: CBC: Recent Labs  Lab 04/06/18 2236  WBC 6.3  NEUTROABS 4.1  HGB 12.7  HCT 36.3  MCV 85.8  PLT 937   Basic Metabolic Panel: Recent Labs  Lab 04/06/18 2236 04/08/18 0557  NA 131* 137  K 3.8 3.6  CL 94* 101  CO2 26 26  GLUCOSE 105* 97  BUN 6 6  CREATININE 0.36* 0.38*  CALCIUM 8.8* 9.4   Liver Function Tests: Recent Labs  Lab 04/06/18 2236  AST 42*  ALT 37  ALKPHOS 58  BILITOT 0.7  PROT 6.1*  ALBUMIN 4.0   Cardiac Enzymes: Recent Labs  Lab 04/06/18 2236  TROPONINI <0.03    Urinalysis    Component Value Date/Time    COLORURINE YELLOW 04/06/2018 2359   APPEARANCEUR CLEAR 04/06/2018 2359   LABSPEC 1.010 04/06/2018 2359   PHURINE 7.5 04/06/2018 2359   GLUCOSEU NEGATIVE 04/06/2018 2359   HGBUR NEGATIVE 04/06/2018 2359   BILIRUBINUR NEGATIVE 04/06/2018 2359   BILIRUBINUR negative 10/23/2017 1607   KETONESUR NEGATIVE 04/06/2018 2359   PROTEINUR NEGATIVE 04/06/2018 2359   UROBILINOGEN 0.2 10/23/2017 1607   NITRITE NEGATIVE 04/06/2018 2359   LEUKOCYTESUR NEGATIVE 04/06/2018 2359    Discussed in detail with patient's son.  Updated care and answered questions.  Time  coordinating discharge: 30 minutes  SIGNED:  Vernell Leep, MD, FACP, [facility]. [facility] Pager [phone]  If 7PM-7AM, please contact night-coverage [email or web address] Password [facility] 04/08/2018, 11:35 AM

## 2018-04-09 ENCOUNTER — Telehealth: Payer: Self-pay

## 2018-04-09 NOTE — Telephone Encounter (Signed)
04/09/18  Transition Care Management Follow-up Telephone Call  ADMISSION DATE: 04/06/18  DISCHARGE DATE: 04/08/18   How have you been since you were released from the hospital?  Patient states she is still having headaches.  Do you understand why you were in the hospital? Yes per patient.   Do you understand the discharge instrcutions? Yes per patient.    Items Reviewed:  Medications reviewed: No patient states she needed to lay down because of headache will review when she comes in. Did clarify Tylenol orders.   Allergies reviewed: Yes   Dietary changes reviewed:Low sodium. Heart healthy   Referrals reviewed: Appointment scheduled with E. Saguier, PA-C   Functional Questionnaire:   Activities of Daily Living (ADLs): Able to perform all per patient.   Any patient concerns? Patient does not understand why she still has headaches.   Confirmed importance and date/time of follow-up visits scheduled:Yes   Confirmed with patient if condition begins to worsen call PCP or go to the ER. Yes    Patient was given the office number and encouragred to call back with questions or concerns. Yes

## 2018-04-16 ENCOUNTER — Encounter: Payer: Self-pay | Admitting: Medical

## 2018-04-16 ENCOUNTER — Ambulatory Visit (HOSPITAL_BASED_OUTPATIENT_CLINIC_OR_DEPARTMENT_OTHER)
Admission: RE | Admit: 2018-04-16 | Discharge: 2018-04-16 | Disposition: A | Discharge status: Home / Self Care | Payer: Medicare HMO | Source: Ambulatory Visit | Attending: Medical | Admitting: Medical

## 2018-04-16 ENCOUNTER — Ambulatory Visit (INDEPENDENT_AMBULATORY_CARE_PROVIDER_SITE_OTHER): Payer: Medicare HMO | Admitting: Medical

## 2018-04-16 VITALS — BP 145/74 | HR 75 | Temp 97.6°F | Resp 16 | Ht 59.0 in | Wt 95.0 lb

## 2018-04-16 DIAGNOSIS — M79631 Pain in right forearm: Secondary | ICD-10-CM

## 2018-04-16 DIAGNOSIS — M79601 Pain in right arm: Secondary | ICD-10-CM | POA: Diagnosis not present

## 2018-04-16 DIAGNOSIS — J449 Chronic obstructive pulmonary disease, unspecified: Secondary | ICD-10-CM

## 2018-04-16 DIAGNOSIS — I10 Essential (primary) hypertension: Secondary | ICD-10-CM | POA: Diagnosis not present

## 2018-04-16 DIAGNOSIS — R413 Other amnesia: Secondary | ICD-10-CM | POA: Diagnosis not present

## 2018-04-16 MED ORDER — AMLODIPINE BESYLATE 2.5 MG PO TABS: 2.5000 mg | ORAL_TABLET | Freq: Every day | ORAL | 0 refills | Status: DC

## 2018-04-16 MED ORDER — SULFAMETHOXAZOLE-TRIMETHOPRIM 400-80 MG PO TABS: 1.0000 | ORAL_TABLET | Freq: Two times a day (BID) | ORAL | 0 refills | Status: DC

## 2018-04-16 MED ORDER — MUPIROCIN CALCIUM 2 % EX CREA: 1.0000 "application " | TOPICAL_CREAM | Freq: Two times a day (BID) | CUTANEOUS | 0 refills | Status: DC

## 2018-04-16 NOTE — Patient Instructions (Signed)
You do have some borderline high blood pressures consistently since January.  This might be contributing factor to your occasional headaches.  I do think is a good idea to give you amlodipine 2.5 mg daily as that will bring down your blood pressure slightly and may help if headaches related to slight blood pressure elevation.  Also regarding occasional headaches, please make sure that you are well-hydrated.  You can use occasional Tylenol for a headache as well.(also get sed rate)  For your right forearm small red area that is mildly inflamed, I am going to prescribe you mupirocin on topical ointment to apply twice daily.  If you see any dramatic redness or tracking up the arm with warmth then start Bactrim oral antibiotic and notify me.  You also have concern for more serious diagnoses after talking to pharmacist.  So I am going to go ahead and arrange for right upper extremity ultrasound to evaluate your veins.  Your breathing is recently stable.  You have any wheezing please let us know and would recommend restarting her albuterol. MD in the hospital thought she might benefit with appointment with neurologist to evaluate your memory.  I will go ahead and make that referral.  Will get cbc an CMP to follow-up from your hospitalization.  Your labs recommended on discharge summary.  Follow-up in 10 days or as needed.

## 2018-04-16 NOTE — Progress Notes (Signed)
Subjective:    Patient ID: Tracey Holt, female    DOB: Jun 18, 1922, 84 y.o.   MRN: [id number]  HPI  Pt in for follow up from hospital.   She however is more worried about slight red area on her proximal forearm. Pt speculated that she may have heard a pop. But then not sure. She does report this initially but when questions states not sure.  When she moves her forearm she feels very minimal pain in this area. No itching. No trauma or fall.   Pt called pharmacist or MD. She states they told her to be seen immediatly  Also follow up on her hospitalization.  Pt has pacemaker. Hx of.  Pt had ha that brought her to the ED. After 2-3 hours HA sent away. HA has not returned. Ct of head was negative. Then later in exam states maybe faint low level ha started. Never escalated. No neurologic associated signs or symptoms.   Pt had mild htn in ED. Also some blood pressure moderate elevated as well since january. On DC summary some speculation of anxiety effect on her bp.  She had abdomen CT that showed. CT abdomen 5/20 were concerning for extraluminal collection in the abdomen. General surgery was consulted and were concerned that this was a infected fluid collection and requested IR to aspirate. However as per IR, the air and fluid collection along the right pericolic gutter is now favored to represent a patulous loop of intestine and not an air and fluid collection (based on repeat CT abdomen without contrast). Resumed diet.   No abdominal pain reported.  COPD stable on dc. Pt does not h having sob.   Hospitalist thinks patient may have some underlying dementia.      Review of Systems  Constitutional: Negative for chills, fatigue and fever.  HENT: Negative for congestion, drooling, facial swelling, mouth sores, postnasal drip, rhinorrhea and sinus pain.   Respiratory: Negative for chest tightness and shortness of breath.   Cardiovascular: Negative for chest pain and palpitations.    Gastrointestinal: Negative for abdominal distention, abdominal pain, blood in stool, constipation, diarrhea, nausea and vomiting.  Endocrine: Negative for polydipsia, polyphagia and polyuria.  Musculoskeletal:       Faint rt forearm pain. See hpi.  Skin:       See hpi.  Neurological: Negative for dizziness, speech difficulty, weakness, light-headedness and numbness.       Pt has obvious short term memory changes. She often times answers question that I just answered.  Hematological: Negative for adenopathy. Does not bruise/bleed easily.  Psychiatric/Behavioral: Negative for agitation, behavioral problems, dysphoric mood, sleep disturbance and suicidal ideas. The patient is not nervous/anxious and is not hyperactive.    Past Medical History:  Diagnosis Date  . Bradycardic cardiac arrest ([place]) 07/24/2016   Required CPR, intubation with multiple rib fractures. Had short term cardiac shock with acute heart failure.  Resulted in possible stress-induced cardiomyopathy  . Cardiac related syncope 07/24/2016   Bradycardic arrest requiring CPR 2 with transvenous pacemaker placed followed by permanent pacemaker; complicated by respiratory arrest, type II MI, stress-induced cardiopathy  . Cardiomyopathy ([place]) 07/2016   Moderate severely reduced EF with apical hypokinesis suggestive of stress-induced cardiac myopathy versus multivessel CAD --> in setting of her to cardiac arrest  . COPD (chronic obstructive pulmonary disease) ([place])    By report  . History of chronic constipation   . Hypercholesterolemia   . Osteoporosis   . Pacemaker 07/26/2016   Abbott dual-chamber pacemaker:  Abbott Assurity Model [id number] - Serial [id number]  . Pneumonia 08/2016  . Sarcoidosis of lung ([place])   . Spinal stenosis      Social History   Socioeconomic History  . Marital status: Widowed    Spouse name: Not on file  . Number of children: Not on file  . Years of education: Not on file  . Highest education level:  Not on file  Occupational History  . Not on file  Social Needs  . Financial resource strain: Not on file  . Food insecurity:    Worry: Not on file    Inability: Not on file  . Transportation needs:    Medical: Not on file    Non-medical: Not on file  Tobacco Use  . Smoking status: Former Smoker    Packs/day: 1.50    Years: 17.00    Pack years: 25.50    Types: Cigarettes    Start date: 12/18/1963    Last attempt to quit: 11/20/1983    Years since quitting: 34.4  . Smokeless tobacco: Never Used  Substance and Sexual Activity  . Alcohol use: No  . Drug use: No  . Sexual activity: Not on file  Lifestyle  . Physical activity:    Days per week: Not on file    Minutes per session: Not on file  . Stress: Not on file  Relationships  . Social connections:    Talks on phone: Not on file    Gets together: Not on file    Attends religious service: Not on file    Active member of club or organization: Not on file    Attends meetings of clubs or organizations: Not on file    Relationship status: Not on file  . Intimate partner violence:    Fear of current or ex partner: Not on file    Emotionally abused: Not on file    Physically abused: Not on file    Forced sexual activity: Not on file  Other Topics Concern  . Not on file  Social History Narrative   Recently moved to [place] from [place] to live near her daughter.    Past Surgical History:  Procedure Laterality Date  . ADENOIDECTOMY    . PACEMAKER INSERTION Left 07/26/2016   Abbott Assurity Model #[id number] - Serial #[id number] -- complicated by pneumothorax requiring chest tube placement  . TEE WITHOUT CARDIOVERSION  02/2013   EF 55-60% with normal global function. No thrombus, mass or vegetation. Trivial-small pericardial effusion. Moderate LA dilation. Mild MR.  . TONSILLECTOMY    . TRANSTHORACIC ECHOCARDIOGRAM  07/24/2016   [facility]: EF 45-50%. Mid and apical inferior, inferolateral and  apical hypokinesis. GR 2 DD. Mild LA and moderate RA dilation. Mild paracardial effusion. Moderate to severe TR. Basal and mid segment hypokinesis with apical hypokinesis. - Suspect stress-induced cardiopathy versus multivessel CAD.  Marland Kitchen TRANSTHORACIC ECHOCARDIOGRAM  07/26/2016   [place]: LIMITED (post PPM) - although the report suggested EF 30-35% with apical akinesis, rounding note suggested this was an improvement    Family History  Problem Relation Age of Onset  . Allergic rhinitis Neg Hx   . Angioedema Neg Hx   . Asthma Neg Hx   . Eczema Neg Hx   . Immunodeficiency Neg Hx   . Urticaria Neg Hx     Allergies  Allergen Reactions  . Dust Mite Extract Shortness Of Breath  . Lidex [Fluocinonide] Swelling    Caused lip swelling  .  Molds & Smuts Shortness Of Breath  . Lactose   . Levaquin [Levofloxacin In D5w]     Couldn't breath   . Penicillins     Arms swelling   . Rifampin     Couldn't breath     Current Outpatient Medications on File Prior to Visit  Medication Sig Dispense Refill  . acetaminophen (TYLENOL) 500 MG tablet Take 500-1,000 mg by mouth every 6 (six) hours as needed for moderate pain or headache.    . albuterol (PROVENTIL HFA;VENTOLIN HFA) 108 (90 Base) MCG/ACT inhaler Inhale 2 puffs into the lungs every 6 (six) hours as needed for wheezing or shortness of breath. 1 Inhaler 2  . Ascorbic Acid (VITAMIN C) 1000 MG tablet Take 1,000 mg by mouth daily.    Marland Kitchen atorvastatin (LIPITOR) 10 MG tablet Take 1/2 tablet (5mg ) by mouth daily 45 tablet 3  . azelastine (ASTELIN) 0.1 % nasal spray Place 1 spray into both nostrils 2 (two) times daily. Use in each nostril as directed 30 mL 0  . B Complex Vitamins (VITAMIN B COMPLEX) TABS Take by mouth.    . Biotin 10000 MCG TABS Take 10,000 mcg by mouth daily.     . Calcium Citrate-Vitamin D (CITRACAL + D PO) Take 1 tablet by mouth 2 (two) times daily.    . ciclopirox (PENLAC) 8 % solution Apply topically at  bedtime. Apply over nail and surrounding skin. Apply daily over previous coat. After seven (7) days, may remove with alcohol and continue cycle. (Patient taking differently: Apply 1 application topically at bedtime. Apply over nail and surrounding skin. Apply daily over previous coat. After seven (7) days, may remove with alcohol and continue cycle.) 6.6 mL 0  . Coenzyme Q10 (CO Q 10) 100 MG CAPS Take 100 mg by mouth daily.    Marland Kitchen docusate sodium (COLACE) 100 MG capsule Take 100 mg by mouth daily.    . fluticasone (FLONASE) 50 MCG/ACT nasal spray Place 1 spray into both nostrils daily.    . hydroxypropyl methylcellulose / hypromellose (ISOPTO TEARS / GONIOVISC) 2.5 % ophthalmic solution Place 1 drop into both eyes 3 (three) times daily as needed for dry eyes.    Marland Kitchen ipratropium (ATROVENT HFA) 17 MCG/ACT inhaler Inhale 2 puffs into the lungs every 6 (six) hours as needed for wheezing. 1 Inhaler 2  . loratadine (CLARITIN) 10 MG tablet Take 10 mg by mouth daily.    . LUTEIN PO Take 1 tablet by mouth daily.    . Misc Natural Products (GLUCOS-CHONDROIT-MSM COMPLEX) TABS Take 2 tablets by mouth daily.    . Multiple Vitamins-Minerals (MULTIVITAMIN WITH MINERALS) tablet Take 1 tablet by mouth daily.    . mupirocin ointment (BACTROBAN) 2 % Apply 1 application topically 2 (two) times daily as needed. Wound Care    . Probiotic CAPS Take 1 capsule by mouth daily.     No current facility-administered medications on file prior to visit.     BP (!) 145/74   Pulse 75   Temp 97.6 F (36.4 C) (Oral)   Resp 16   Ht 4\' 11"  (1.499 m)   Wt 95 lb (43.1 kg)   SpO2 97%   BMI 19.19 kg/m       Objective:   Physical Exam  General Mental Status- Alert. General Appearance- Not in acute distress.   Skin General: Color- Normal Color. Moisture- Normal Moisture.  Neck Carotid Arteries- Normal color. Moisture- Normal Moisture. No carotid bruits. No JVD.  Chest and Lung  Exam Auscultation: Breath  Sounds:-Normal.  Cardiovascular Auscultation:Rythm- Regular. Murmurs & Other Heart Sounds:Auscultation of the heart reveals- No Murmurs.  Abdomen Inspection:-Inspeection Normal. Palpation/Percussion:Note:No mass. Palpation and Percussion of the abdomen reveal- Non Tender, Non Distended + BS, no rebound or guarding.    Neurologic Cranial Nerve exam:- CN III-XII intact(No nystagmus), symmetric smile. Drift Test:- No drift. Romberg Exam:- Negative.  Strength:- 5/5 equal and symmetric strength both upper and lower extremities.     Assessment & Plan:  You do have some borderline high blood pressures consistently since January.  This might be contributing factor to your occasional headaches.  I do think is a good idea to give you amlodipine 2.5 mg daily as that will bring down your blood pressure slightly and may help if headaches related to slight blood pressure elevation.  Also regarding occasional headaches, please make sure that you are well-hydrated.  You can use occasional Tylenol for a headache as well.(also get sed rate)  For your right forearm small red area that is mildly inflamed, I am going to prescribe you mupirocin on topical ointment to apply twice daily.  If you see any dramatic redness or tracking up the arm with warmth then start Bactrim oral antibiotic and notify me.  You also have concern for more serious diagnoses after talking to pharmacist.  So I am going to go ahead and arrange for right upper extremity ultrasound to evaluate your veins.  Your breathing is recently stable.  You have any wheezing please let us know and would recommend restarting her albuterol. MD in the hospital thought she might benefit with appointment with neurologist to evaluate your memory.  I will go ahead and make that referral.  Will get cbc an CMP to follow-up from your hospitalization.  Your labs recommended on discharge summary.  Follow-up in 10 days or as needed.  Mackie Pai, PA-C

## 2018-04-17 LAB — COMPREHENSIVE METABOLIC PANEL
ALT: 32 U/L (ref 0–35)
AST: 33 U/L (ref 0–37)
Albumin: 4.3 g/dL (ref 3.5–5.2)
Alkaline Phosphatase: 53 U/L (ref 39–117)
BUN: 13 mg/dL (ref 6–23)
CHLORIDE: 94 meq/L — AB (ref 96–112)
CO2: 31 meq/L (ref 19–32)
CREATININE: 0.54 mg/dL (ref 0.40–1.20)
Calcium: 9.5 mg/dL (ref 8.4–10.5)
GFR: 111.74 mL/min (ref >60.00)
GLUCOSE: 104 mg/dL — AB (ref 70–99)
Potassium: 4.9 mEq/L (ref 3.5–5.1)
SODIUM: 131 meq/L — AB (ref 135–145)
Total Bilirubin: 0.6 mg/dL (ref 0.2–1.2)
Total Protein: 6.7 g/dL (ref 6.0–8.3)

## 2018-04-17 LAB — CBC WITH DIFFERENTIAL/PLATELET
BASOS PCT: 0.8 % (ref 0.0–3.0)
Basophils Absolute: 0 10*3/uL (ref 0.0–0.1)
EOS ABS: 0.1 10*3/uL (ref 0.0–0.7)
Eosinophils Relative: 0.9 % (ref 0.0–5.0)
HCT: 36.6 % (ref 36.0–46.0)
Hemoglobin: 12.4 g/dL (ref 12.0–15.0)
Lymphocytes Relative: 15.4 % (ref 12.0–46.0)
Lymphs Abs: 1 10*3/uL (ref 0.7–4.0)
MCHC: 34 g/dL (ref 30.0–36.0)
MCV: 87.6 fl (ref 78.0–100.0)
MONO ABS: 0.7 10*3/uL (ref 0.1–1.0)
Monocytes Relative: 11.2 % (ref 3.0–12.0)
NEUTROS ABS: 4.4 10*3/uL (ref 1.4–7.7)
NEUTROS PCT: 71.7 % (ref 43.0–77.0)
PLATELETS: 264 10*3/uL (ref 150.0–400.0)
RBC: 4.17 Mil/uL (ref 3.87–5.11)
RDW: 15.3 % (ref 11.5–15.5)
WBC: 6.2 10*3/uL (ref 4.0–10.5)

## 2018-04-18 ENCOUNTER — Telehealth: Payer: Self-pay | Admitting: Medical

## 2018-04-18 NOTE — Telephone Encounter (Signed)
If the surface area of redness is expanding, warmth to skin or tenderness then would recommend she start the oral antibiotic.

## 2018-04-18 NOTE — Telephone Encounter (Signed)
Pt.notified

## 2018-04-18 NOTE — Telephone Encounter (Signed)
Notified pt PCP advised to started antibiotic. Pt states it only a little redder. Pt wants to know if that makes a difference. Please  Advise.

## 2018-04-18 NOTE — Telephone Encounter (Signed)
Copied from [place] [phone]. Topic: Quick Communication - See Telephone Encounter >> Apr 18, 2018 12:36 PM Neva Seat wrote: Pt wanted to let Dr. Harvie Heck know her arm is a little redder start oral antibiotic. Marland Kitchen  Please call pt back to discuss.

## 2018-04-18 NOTE — Telephone Encounter (Signed)
Since area is getting redder start antibiotic.

## 2018-04-20 ENCOUNTER — Other Ambulatory Visit: Payer: Self-pay | Admitting: Medical

## 2018-04-22 ENCOUNTER — Telehealth: Payer: Self-pay | Admitting: Medical

## 2018-04-22 ENCOUNTER — Other Ambulatory Visit: Payer: Self-pay | Admitting: Medical

## 2018-04-22 NOTE — Telephone Encounter (Signed)
Copied from [place] [id number]. Topic: Quick Communication - See Telephone Encounter >> Apr 22, 2018 10:14 AM Percell Belt A wrote: CRM for notification. See Telephone encounter for: 04/22/18. Pt called in and stated that her previous dr had her taking Claritin during the day and levocetirizine (XYZAL) 5 MG tablet at night.  Pt stated she has NIGHT allergies??  She would like this filled   Please advise  Best number [phone]

## 2018-04-22 NOTE — Telephone Encounter (Signed)
April 22, 2018   Richardson Landry [place], [place]       1:07 PM  Note    Left pt a message, PCP only wants pt on one allergy medication. PCP will send in Xyzal and pt is to stop Claritin.

## 2018-04-22 NOTE — Telephone Encounter (Signed)
Pt. returned call.  Advised of recommendation by Ed Saguier to only take one allergy medication.  Advised that he will send Rx for Xyzal and to d/c the Claritin.  The pt. stated "I can't stop the Claritin.  I need both of the medications; I've been on them for years.  Tell him please don't stop the Claritin."  Advised will send message to Ed Saguier with this request.  Pt. stated to leave a voice message, if she doesn't answer.

## 2018-04-22 NOTE — Telephone Encounter (Signed)
Left pt a message, PCP only wants pt on one allergy medication. PCP will send in Xyzal and pt is to stop Claritin.

## 2018-04-22 NOTE — Telephone Encounter (Signed)
Copied from [place] [phone]. Topic: Quick Communication - See Telephone Encounter >> Apr 22, 2018 10:14 AM Percell Belt A wrote: CRM for notification. See Telephone encounter for: 04/22/18. Pt called in and stated that her previous dr had her taking Claritin during the day and levocetirizine (XYZAL) 5 MG tablet at night.  Pt stated she has NIGHT allergies??  She would like this filled   Please advise  Best number [phone]

## 2018-04-23 NOTE — Telephone Encounter (Signed)
I got pt message about her  feeling that she needs both xyzal and claritin. I can send xyzal to pharmacy. It is up to her if she wants to add on claritin.Since she has been using both or years she can continue if she wants. I can't prescribe both and I am just obligated to advise her typically 2 antihistamines not recommened/advised. Claritin is over the counter.

## 2018-04-24 ENCOUNTER — Ambulatory Visit (INDEPENDENT_AMBULATORY_CARE_PROVIDER_SITE_OTHER): Payer: Medicare HMO | Admitting: Medical

## 2018-04-24 ENCOUNTER — Encounter: Payer: Self-pay | Admitting: Medical

## 2018-04-24 VITALS — BP 133/71 | HR 73 | Temp 98.2°F | Resp 16 | Ht 59.0 in | Wt 92.8 lb

## 2018-04-24 DIAGNOSIS — J301 Allergic rhinitis due to pollen: Secondary | ICD-10-CM | POA: Diagnosis not present

## 2018-04-24 DIAGNOSIS — I10 Essential (primary) hypertension: Secondary | ICD-10-CM | POA: Diagnosis not present

## 2018-04-24 NOTE — Progress Notes (Signed)
Subjective:    Patient ID: Tracey Holt, female    DOB: [birth year], 84 y.o.   MRN: [id number]  HPI  Pt in to discuss allergy meds. I had written xyzal and she states it helped her sleep as well as control her allergies. She wants to continue with claritin. She states she has been on  both and reported no side effects. She is adamant that she wants to use both.   Pt states her rt forearm red area never got worse. She place mupirocin on area and seemed to lighten up some. But never got warm or tender. Redness never expanded.  Pt bp is controlled today and she did not take medication. She does not want to be on bp medication and questions need for.   Review of Systems  HENT: Positive for congestion and postnasal drip. Negative for drooling, ear pain, facial swelling, mouth sores, nosebleeds, sinus pressure and sinus pain.        Daily mild symptoms.  Respiratory: Negative for cough, chest tightness, shortness of breath and wheezing.   Cardiovascular: Negative for chest pain and palpitations.  Gastrointestinal: Negative for abdominal pain.  Neurological: Negative for facial asymmetry, speech difficulty and headaches.  Hematological: Negative for adenopathy. Does not bruise/bleed easily.  Psychiatric/Behavioral: Negative for agitation, behavioral problems, hallucinations, sleep disturbance and suicidal ideas. The patient is not nervous/anxious.     Past Medical History:  Diagnosis Date  . Bradycardic cardiac arrest ([place]) 07/24/2016   Required CPR, intubation with multiple rib fractures. Had short term cardiac shock with acute heart failure.  Resulted in possible stress-induced cardiomyopathy  . Cardiac related syncope 07/24/2016   Bradycardic arrest requiring CPR 2 with transvenous pacemaker placed followed by permanent pacemaker; complicated by respiratory arrest, type II MI, stress-induced cardiopathy  . Cardiomyopathy ([place]) 07/2016   Moderate severely reduced EF with apical hypokinesis  suggestive of stress-induced cardiac myopathy versus multivessel CAD --> in setting of her to cardiac arrest  . COPD (chronic obstructive pulmonary disease) ([place])    By report  . History of chronic constipation   . Hypercholesterolemia   . Osteoporosis   . Pacemaker 07/26/2016   Abbott dual-chamber pacemaker: [place] #[id number] - Serial [id number]  . Pneumonia 08/2016  . Sarcoidosis of lung ([place])   . Spinal stenosis      Social History   Socioeconomic History  . Marital status: Widowed    Spouse name: Not on file  . Number of children: Not on file  . Years of education: Not on file  . Highest education level: Not on file  Occupational History  . Not on file  Social Needs  . Financial resource strain: Not on file  . Food insecurity:    Worry: Not on file    Inability: Not on file  . Transportation needs:    Medical: Not on file    Non-medical: Not on file  Tobacco Use  . Smoking status: Former Smoker    Packs/day: 1.50    Years: 17.00    Pack years: 25.50    Types: Cigarettes    Start date: 12/18/1963    Last attempt to quit: 11/20/1983    Years since quitting: 34.4  . Smokeless tobacco: Never Used  Substance and Sexual Activity  . Alcohol use: No  . Drug use: No  . Sexual activity: Not on file  Lifestyle  . Physical activity:    Days per week: Not on file    Minutes per session: Not on  file  . Stress: Not on file  Relationships  . Social connections:    Talks on phone: Not on file    Gets together: Not on file    Attends religious service: Not on file    Active member of club or organization: Not on file    Attends meetings of clubs or organizations: Not on file    Relationship status: Not on file  . Intimate partner violence:    Fear of current or ex partner: Not on file    Emotionally abused: Not on file    Physically abused: Not on file    Forced sexual activity: Not on file  Other Topics Concern  . Not on file  Social History Narrative    Recently moved to [place] from [place] to live near her daughter.    Past Surgical History:  Procedure Laterality Date  . ADENOIDECTOMY    . PACEMAKER INSERTION Left 07/26/2016   Abbott Assurity Model #[id number] - Serial #[id number] -- complicated by pneumothorax requiring chest tube placement  . TEE WITHOUT CARDIOVERSION  02/2013   EF 55-60% with normal global function. No thrombus, mass or vegetation. Trivial-small pericardial effusion. Moderate LA dilation. Mild MR.  . TONSILLECTOMY    . TRANSTHORACIC ECHOCARDIOGRAM  07/24/2016   [facility]: EF 45-50%. Mid and apical inferior, inferolateral and apical hypokinesis. GR 2 DD. Mild LA and moderate RA dilation. Mild paracardial effusion. Moderate to severe TR. Basal and mid segment hypokinesis with apical hypokinesis. - Suspect stress-induced cardiopathy versus multivessel CAD.  Marland Kitchen TRANSTHORACIC ECHOCARDIOGRAM  07/26/2016   [place]: LIMITED (post PPM) - although the report suggested EF 30-35% with apical akinesis, rounding note suggested this was an improvement    Family History  Problem Relation Age of Onset  . Allergic rhinitis Neg Hx   . Angioedema Neg Hx   . Asthma Neg Hx   . Eczema Neg Hx   . Immunodeficiency Neg Hx   . Urticaria Neg Hx     Allergies  Allergen Reactions  . Dust Mite Extract Shortness Of Breath  . Lidex [Fluocinonide] Swelling    Caused lip swelling  . Molds & Smuts Shortness Of Breath  . Lactose   . Levaquin [Levofloxacin In D5w]     Couldn't breath   . Penicillins     Arms swelling   . Rifampin     Couldn't breath     Current Outpatient Medications on File Prior to Visit  Medication Sig Dispense Refill  . acetaminophen (TYLENOL) 500 MG tablet Take 500-1,000 mg by mouth every 6 (six) hours as needed for moderate pain or headache.    . albuterol (PROVENTIL HFA;VENTOLIN HFA) 108 (90 Base) MCG/ACT inhaler Inhale 2 puffs into the lungs every 6 (six)  hours as needed for wheezing or shortness of breath. 1 Inhaler 2  . amLODipine (NORVASC) 2.5 MG tablet Take 1 tablet (2.5 mg total) by mouth daily. 30 tablet 0  . Ascorbic Acid (VITAMIN C) 1000 MG tablet Take 1,000 mg by mouth daily.    Marland Kitchen atorvastatin (LIPITOR) 10 MG tablet Take 1/2 tablet (5mg ) by mouth daily 45 tablet 3  . azelastine (ASTELIN) 0.1 % nasal spray Place 1 spray into both nostrils 2 (two) times daily. Use in each nostril as directed 30 mL 0  . B Complex Vitamins (VITAMIN B COMPLEX) TABS Take by mouth.    . Biotin 10000 MCG TABS Take 10,000 mcg by mouth daily.     Marland Kitchen  Calcium Citrate-Vitamin D (CITRACAL + D PO) Take 1 tablet by mouth 2 (two) times daily.    . ciclopirox (PENLAC) 8 % solution Apply topically at bedtime. Apply over nail and surrounding skin. Apply daily over previous coat. After seven (7) days, may remove with alcohol and continue cycle. (Patient taking differently: Apply 1 application topically at bedtime. Apply over nail and surrounding skin. Apply daily over previous coat. After seven (7) days, may remove with alcohol and continue cycle.) 6.6 mL 0  . Coenzyme Q10 (CO Q 10) 100 MG CAPS Take 100 mg by mouth daily.    Marland Kitchen docusate sodium (COLACE) 100 MG capsule Take 100 mg by mouth daily.    . fluticasone (FLONASE) 50 MCG/ACT nasal spray Place 1 spray into both nostrils daily.    . hydroxypropyl methylcellulose / hypromellose (ISOPTO TEARS / GONIOVISC) 2.5 % ophthalmic solution Place 1 drop into both eyes 3 (three) times daily as needed for dry eyes.    Marland Kitchen ipratropium (ATROVENT HFA) 17 MCG/ACT inhaler Inhale 2 puffs into the lungs every 6 (six) hours as needed for wheezing. 1 Inhaler 2  . LUTEIN PO Take 1 tablet by mouth daily.    . Misc Natural Products (GLUCOS-CHONDROIT-MSM COMPLEX) TABS Take 2 tablets by mouth daily.    . Multiple Vitamins-Minerals (MULTIVITAMIN WITH MINERALS) tablet Take 1 tablet by mouth daily.    . mupirocin cream (BACTROBAN) 2 % Apply 1 application  topically 2 (two) times daily. 30 g 0  . mupirocin ointment (BACTROBAN) 2 % Apply 1 application topically 2 (two) times daily as needed. Wound Care    . Probiotic CAPS Take 1 capsule by mouth daily.    Marland Kitchen sulfamethoxazole-trimethoprim (BACTRIM) 400-80 MG tablet Take 1 tablet by mouth 2 (two) times daily. Generic ok 14 tablet 0   No current facility-administered medications on file prior to visit.     BP 133/71   Pulse 73   Temp 98.2 F (36.8 C) (Oral)   Resp 16   Ht 4\' 11"  (1.499 m)   Wt 92 lb 12.8 oz (42.1 kg)   SpO2 96%   BMI 18.74 kg/m        Objective:   Physical Exam  General  Mental Status - Alert. General Appearance - Well groomed. Not in acute distress.  Skin Rashes- No Rashes.  HEENT Head- Normal. Ear Auditory Canal - Left- Normal. Right - Normal.Tympanic Membrane- Left- Normal. Right- Normal. Eye Sclera/Conjunctiva- Left- Normal. Right- Normal. Nose & Sinuses Nasal Mucosa- Left-  Boggy and Congested. Right-  Boggy and  Congested.Bilateral  No maxillary and No frontal sinus pressure. Mouth & Throat Lips: Upper Lip- Normal: no dryness, cracking, pallor, cyanosis, or vesicular eruption. Lower Lip-Normal: no dryness, cracking, pallor, cyanosis or vesicular eruption. Buccal Mucosa- Bilateral- No Aphthous ulcers. Oropharynx- No Discharge or Erythema. Tonsils: Characteristics- Bilateral- No Erythema or Congestion. Size/Enlargement- Bilateral- No enlargement. Discharge- bilateral-None.  Neck Neck- Supple. No Masses.   Chest and Lung Exam Auscultation: Breath Sounds:-Clear even and unlabored.  Cardiovascular Auscultation:Rythm- Regular, rate and rhythm. Murmurs & Other Heart Sounds:Ausculatation of the heart reveal- No Murmurs.  Lymphatic Head & Neck General Head & Neck Lymphatics: Bilateral: Description- No Localized lymphadenopathy.  Skin- small area on forearm 1 mm x 2 mm. Slight hyperpigmented area. No redness. No wamth and not tender.         Assessment & Plan:  For your allergies, you can use the Xyzal 2.5mg  daily.  You can get this over-the-counter.  In addition  you report that you want to take both Claritin and Xyzal.  Official recommendation is just use 1 of the antihistamines but you can get Claritin over-the-counter if you choose.  Your blood pressure is well controlled today.  You have been taking low-dose amlodipine on a daily basis but did not take medicine today..  You question whether or not you need to be on amlodipine.  I would recommend that you get a wrist blood pressure cuff and check blood pressure on daily basis.  If you get blood pressure readings over 140/90 then definitely take amlodipine on a daily basis.  Your slight red area on forearm region has improved.  You can use mupirocin on twice daily for 7 more days.  Also you can use a moisturizer with vitamin E twice daily or you could get vitamin E oil over-the-counter and apply small dab twice daily.  Follow-up in 1 month or as needed.  Mackie Pai, PA-C

## 2018-04-24 NOTE — Telephone Encounter (Signed)
Pt has appointment today.  

## 2018-04-24 NOTE — Patient Instructions (Addendum)
For your allergies, you can use the Xyzal 2.5mg  daily.  You can get this over-the-counter.  In addition you report that you want to take both Claritin and Xyzal.  Official recommendation is just use 1 of the antihistamines but you can get Claritin over-the-counter if you choose.  Your blood pressure is well controlled today.  You have been taking low-dose amlodipine on a daily basis but did not take medicine today..  You question whether or not you need to be on amlodipine.  I would recommend that you get a wrist blood pressure cuff and check blood pressure on daily basis.  If you get blood pressure readings over 140/90 then definitely take amlodipine on a daily basis.  Your slight red area on forearm region has improved.  You can use mupirocin on twice daily for 7 more days.  Also you can use a moisturizer with vitamin E twice daily or you could get vitamin E oil over-the-counter and apply small dab twice daily.  Follow-up in 1 month or as needed.

## 2018-05-08 ENCOUNTER — Other Ambulatory Visit: Payer: Self-pay | Admitting: Medical

## 2018-05-16 ENCOUNTER — Telehealth: Payer: Self-pay | Admitting: Medical

## 2018-05-16 MED ORDER — AMLODIPINE BESYLATE 2.5 MG PO TABS: 2.5000 mg | ORAL_TABLET | Freq: Every day | ORAL | 5 refills | Status: DC

## 2018-05-16 NOTE — Telephone Encounter (Signed)
Mediation sent to pharmacy for patient.

## 2018-05-16 NOTE — Telephone Encounter (Signed)
Copied from [place] [id number]. Topic: Quick Communication - Rx Refill/Question >> May 16, 2018  8:12 AM Carolyn Stare wrote: Medication amLODipine (NORVASC) 2.5 MG tablet   Preferred Pharmacy  [facility]   Agent: Please be advised that RX refills may take up to 3 business days. We ask that you follow-up with your pharmacy.

## 2018-05-19 ENCOUNTER — Telehealth: Payer: Self-pay

## 2018-05-19 NOTE — Telephone Encounter (Signed)
Pt was requesting sooner appointment with dermatologist. She has appointment already in one month.  Also she had requested to be seen by female dermatologist.   Is she seeing female dermatologist??  Let me know how long wait times are for derm are recently. If switching to other derm makes sense?

## 2018-05-19 NOTE — Telephone Encounter (Signed)
Copied from [place] [phone]. Topic: Referral - Question >> May 19, 2018  9:18 AM Antonieta Iba C wrote: Reason for CRM: pt has a referral with dermatology, she said that its not until a month away, pt would like to know if provider could have her seen sooner?    CB: [phone]

## 2018-05-20 NOTE — Telephone Encounter (Signed)
All dermatologist are out til aug/nov, if pt has an appt already she will need to call them and request to see a female.

## 2018-05-20 NOTE — Telephone Encounter (Signed)
Per [facility] wait time/soonest appointment with dermatologist other offices august earliest.Some office first available November. So would recommend that she keep her appointment with current dermatologist in one month. She could call there office and ask to be put on cancellation list. That way if someone cancels she may get in sooner.

## 2018-05-26 ENCOUNTER — Encounter: Payer: Self-pay | Admitting: Medical

## 2018-05-26 ENCOUNTER — Ambulatory Visit (INDEPENDENT_AMBULATORY_CARE_PROVIDER_SITE_OTHER): Payer: Medicare HMO | Admitting: Medical

## 2018-05-26 ENCOUNTER — Ambulatory Visit (HOSPITAL_BASED_OUTPATIENT_CLINIC_OR_DEPARTMENT_OTHER)
Admission: RE | Admit: 2018-05-26 | Discharge: 2018-05-26 | Disposition: A | Discharge status: Home / Self Care | Payer: Medicare HMO | Source: Ambulatory Visit | Attending: Medical | Admitting: Medical

## 2018-05-26 ENCOUNTER — Telehealth: Payer: Self-pay | Admitting: Medical

## 2018-05-26 VITALS — BP 130/79 | HR 84 | Temp 98.0°F | Resp 16 | Ht 59.0 in | Wt 96.4 lb

## 2018-05-26 DIAGNOSIS — J449 Chronic obstructive pulmonary disease, unspecified: Secondary | ICD-10-CM | POA: Diagnosis not present

## 2018-05-26 DIAGNOSIS — R918 Other nonspecific abnormal finding of lung field: Secondary | ICD-10-CM | POA: Insufficient documentation

## 2018-05-26 DIAGNOSIS — R05 Cough: Secondary | ICD-10-CM

## 2018-05-26 DIAGNOSIS — R059 Cough, unspecified: Secondary | ICD-10-CM

## 2018-05-26 DIAGNOSIS — I700 Atherosclerosis of aorta: Secondary | ICD-10-CM | POA: Diagnosis not present

## 2018-05-26 DIAGNOSIS — I10 Essential (primary) hypertension: Secondary | ICD-10-CM

## 2018-05-26 DIAGNOSIS — J189 Pneumonia, unspecified organism: Secondary | ICD-10-CM

## 2018-05-26 DIAGNOSIS — R5383 Other fatigue: Secondary | ICD-10-CM | POA: Diagnosis not present

## 2018-05-26 MED ORDER — AZITHROMYCIN 250 MG PO TABS: ORAL_TABLET | ORAL | 0 refills | Status: DC

## 2018-05-26 MED ORDER — MUPIROCIN 2 % EX OINT: 1.0000 "application " | TOPICAL_OINTMENT | Freq: Two times a day (BID) | CUTANEOUS | Status: DC | PRN

## 2018-05-26 NOTE — Telephone Encounter (Signed)
Pt states she has appointment with dermatologist. She is little forgetful and doe not remember exact date or location. She thinks it is in one month. Will you look into that and let me know.   Call pt and notify her on exact date. Please.

## 2018-05-26 NOTE — Patient Instructions (Addendum)
For your intermittent productive cough and fatigue, I do have concern that you may have bronchitis versus pneumonia.  You can continue with your over-the-counter cough medication that seems to be suppressing the cough adequately.  In addition I am going to send you in a azithromycin antibiotic.  We will follow the chest  x-ray and let you know the result.  Even if your x-ray does not show any obvious pneumonia, I do think that starting antibiotic in light of your 2 weeks duration of illness would be a good idea.  For reported fatigue recently, I am getting CBC, CMP and TSH.  Your blood pressure is well controlled recently.  I would recommend that you continue amlodipine.  Follow-up in 7 to 10 days or as needed.   After xray reviewed and evaluating pt finger.Decided to continue azithromycin. Do warm salt water soaks to thumb and apply mupirocin twice daily to tip of thumb region. If area of thumb worsens advised pt to notify me. In that event would consider switching to doxycycline. If any rapid expansion up arm or redness then advised ED evaluation. Not however cough and possible pneumonia primary area of concern.

## 2018-05-26 NOTE — Progress Notes (Signed)
Subjective:    Patient ID: Tracey Holt, female    DOB: January 06, 1922, 84 y.o.   MRN: [id number]  HPI  Pt in with cough with mild fatigue. No fever, no chills or sweats. A little bit of colored mucus on and off.  Pt over all rarely gets bronchitis Pt states doing well with tussin cough med.   Pt also has htn. She is on amlodipine 2.5 mg tablet a day. Started about 3-4 days ago. No side effects.   Review of Systems  Constitutional: Positive for fatigue. Negative for chills and fever.  HENT: Negative for congestion, hearing loss, nosebleeds, rhinorrhea, sinus pressure and sinus pain.   Respiratory: Positive for cough. Negative for chest tightness, shortness of breath and wheezing.   Cardiovascular: Negative for chest pain and palpitations.  Gastrointestinal: Negative for abdominal pain.  Musculoskeletal: Negative for back pain.       Rt thumb pain at pad tip. Just this recently past week. She forgot to mention this and came back up after cxr. I rechecked area.  Neurological: Negative for dizziness, syncope and headaches.  Hematological: Negative for adenopathy. Does not bruise/bleed easily.  Psychiatric/Behavioral: Negative for behavioral problems, confusion and hallucinations.       Objective:   Physical Exam   General  Mental Status - Alert. General Appearance - Well groomed. Not in acute distress.  Skin Rashes- No Rashes.  HEENT Head- Normal. Ear Auditory Canal - Left- Normal. Right - Normal.Tympanic Membrane- Left- Normal. Right- Normal. Eye Sclera/Conjunctiva- Left- Normal. Right- Normal. Nose & Sinuses Nasal Mucosa- Left-  Boggy and Congested. Right-  Boggy and  Congested.Bilateral no  maxillary and no  frontal sinus pressure. Mouth & Throat Lips: Upper Lip- Normal: no dryness, cracking, pallor, cyanosis, or vesicular eruption. Lower Lip-Normal: no dryness, cracking, pallor, cyanosis or vesicular eruption. Buccal Mucosa- Bilateral- No Aphthous ulcers. Oropharynx- No  Discharge or Erythema. Tonsils: Characteristics- Bilateral- No Erythema or Congestion. Size/Enlargement- Bilateral- No enlargement. Discharge- bilateral-None.  Neck Neck- Supple. No Masses.   Chest and Lung Exam Auscultation: Breath Sounds:- even and unlabored. Faint upper lobe rough breath sound. Worse on rt upper side.  Cardiovascular Auscultation:Rythm- Regular, rate and rhythm. Murmurs & Other Heart Sounds:Ausculatation of the heart reveal- No Murmurs.  Lymphatic Head & Neck General Head & Neck Lymphatics: Bilateral: Description- No Localized lymphadenopathy.  Rt thumb- mild pink appearance to distal tip of thumb Mlld tender just at distal nail edge. Faint pink/redness at pad.     Assessment & Plan:  For your intermittent productive cough and fatigue, I do have concern that you may have bronchitis versus pneumonia.  You can continue with your over-the-counter cough medication that seems to be suppressing the cough adequately.  In addition I am going to send you in a azithromycin antibiotic.  We will follow the chest  x-ray and let you know the result.  Even if your x-ray does not show any obvious pneumonia, I do think that starting antibiotic in light of your 2 weeks duration of illness would be a good idea.  For reported fatigue recently, I am getting CBC, CMP and TSH.  Your blood pressure is well controlled recently.  I would recommend that you continue amlodipine.  Follow-up in 7 to 10 days or as needed.  After xray reviewed and evaluating pt finger.Decided to continue azithromycin. Do warm salt water soaks to thumb and apply mupirocin twice daily to tip of thumb region. If area of thumb worsens advised pt to notify me. In  that event would consider switching to doxycycline. If any rapid expansion up arm or redness then advised ED evaluation.    Mackie Pai, PA-C

## 2018-05-27 ENCOUNTER — Telehealth: Payer: Self-pay | Admitting: Medical

## 2018-05-27 DIAGNOSIS — R7989 Other specified abnormal findings of blood chemistry: Secondary | ICD-10-CM

## 2018-05-27 LAB — COMPREHENSIVE METABOLIC PANEL
ALK PHOS: 56 U/L (ref 39–117)
ALT: 29 U/L (ref 0–35)
AST: 33 U/L (ref 0–37)
Albumin: 4.1 g/dL (ref 3.5–5.2)
BUN: 19 mg/dL (ref 6–23)
CO2: 32 mEq/L (ref 19–32)
Calcium: 9.4 mg/dL (ref 8.4–10.5)
Chloride: 98 mEq/L (ref 96–112)
Creatinine, Ser: 0.69 mg/dL (ref 0.40–1.20)
GFR: 84.19 mL/min (ref >60.00)
GLUCOSE: 85 mg/dL (ref 70–99)
POTASSIUM: 4.2 meq/L (ref 3.5–5.1)
SODIUM: 136 meq/L (ref 135–145)
TOTAL PROTEIN: 6.3 g/dL (ref 6.0–8.3)
Total Bilirubin: 0.7 mg/dL (ref 0.2–1.2)

## 2018-05-27 LAB — CBC WITH DIFFERENTIAL/PLATELET
BASOS ABS: 0 10*3/uL (ref 0.0–0.1)
Basophils Relative: 0.2 % (ref 0.0–3.0)
Eosinophils Absolute: 0.1 10*3/uL (ref 0.0–0.7)
Eosinophils Relative: 2 % (ref 0.0–5.0)
HCT: 37 % (ref 36.0–46.0)
Hemoglobin: 12.4 g/dL (ref 12.0–15.0)
LYMPHS ABS: 1.1 10*3/uL (ref 0.7–4.0)
Lymphocytes Relative: 25.1 % (ref 12.0–46.0)
MCHC: 33.5 g/dL (ref 30.0–36.0)
MCV: 89 fl (ref 78.0–100.0)
MONO ABS: 0.5 10*3/uL (ref 0.1–1.0)
Monocytes Relative: 11.4 % (ref 3.0–12.0)
NEUTROS PCT: 61.3 % (ref 43.0–77.0)
Neutro Abs: 2.7 10*3/uL (ref 1.4–7.7)
Platelets: 189 10*3/uL (ref 150.0–400.0)
RBC: 4.16 Mil/uL (ref 3.87–5.11)
RDW: 14.6 % (ref 11.5–15.5)
WBC: 4.4 10*3/uL (ref 4.0–10.5)

## 2018-05-27 LAB — TSH: TSH: 5 u[IU]/mL — ABNORMAL HIGH (ref 0.35–4.50)

## 2018-05-27 NOTE — Telephone Encounter (Signed)
Future tsh and t4 placed. 

## 2018-05-27 NOTE — Telephone Encounter (Signed)
Pt had an appt in April not sure if she has any upcoming appts pt need to contact [facility] to see if she has upcoming appts

## 2018-05-28 NOTE — Telephone Encounter (Signed)
Tracey Holt is not sure when your appointment with dermatology is? Tracey Holt recommends pt call [name] dermatologist to get clarification on when her appointment is. Pt thinks it is one month out. Will you pass message to her. Also will you ask patient which practice she though she appointment with. She told me she had appointment with someone in one month?

## 2018-05-29 NOTE — Telephone Encounter (Signed)
Left message to return call. Ok for pec to discuss.  

## 2018-06-09 ENCOUNTER — Telehealth: Payer: Self-pay | Admitting: Medical

## 2018-06-09 NOTE — Telephone Encounter (Signed)
Pt returned phone call. Per [facility] note, pt. has dermatology appointment with Jorene Minors on [address] on 8/7. Percell Miller, PCP and Meredith Mody, referral coordinator made aware.

## 2018-06-09 NOTE — Telephone Encounter (Signed)
June 25, 2018 is just around the corner. So advise her to keep that appointment with dermatologist.

## 2018-06-09 NOTE — Telephone Encounter (Signed)
Copied from [place] [id number]. Topic: Quick Communication - See Telephone Encounter >> Jun 09, 2018 10:33 AM Mylinda Latina, NT wrote: CRM for notification. See Telephone encounter for: 06/09/18. Patient called and states she an appt on August 7th w/ [facility] on [address] . Please advise

## 2018-06-12 NOTE — Telephone Encounter (Signed)
Pt is 84 year old. I do have some opening tomorrow afternoon. If you could get her in early afternoon so she does not have to wait long that would be best. I am not sure if needs antibiotic but need to be cautious due to her age and as the weekend approaches.

## 2018-06-12 NOTE — Telephone Encounter (Signed)
Copied from [place] [id number]. Topic: General - Other >> Jun 12, 2018  3:18 PM Carolyn Stare wrote:  Pt said her deep cough is back and is asking if [place] will call in a [place]

## 2018-06-13 ENCOUNTER — Ambulatory Visit (HOSPITAL_BASED_OUTPATIENT_CLINIC_OR_DEPARTMENT_OTHER)
Admission: RE | Admit: 2018-06-13 | Discharge: 2018-06-13 | Disposition: A | Discharge status: Home / Self Care | Payer: Medicare HMO | Source: Ambulatory Visit | Attending: Medical | Admitting: Medical

## 2018-06-13 ENCOUNTER — Encounter: Payer: Self-pay | Admitting: Medical

## 2018-06-13 ENCOUNTER — Ambulatory Visit (INDEPENDENT_AMBULATORY_CARE_PROVIDER_SITE_OTHER): Payer: Medicare HMO | Admitting: Medical

## 2018-06-13 VITALS — BP 125/78 | HR 88 | Temp 97.5°F | Resp 16 | Ht 59.0 in | Wt 93.8 lb

## 2018-06-13 DIAGNOSIS — L089 Local infection of the skin and subcutaneous tissue, unspecified: Secondary | ICD-10-CM | POA: Diagnosis not present

## 2018-06-13 DIAGNOSIS — J40 Bronchitis, not specified as acute or chronic: Secondary | ICD-10-CM

## 2018-06-13 DIAGNOSIS — J209 Acute bronchitis, unspecified: Secondary | ICD-10-CM | POA: Diagnosis not present

## 2018-06-13 DIAGNOSIS — J449 Chronic obstructive pulmonary disease, unspecified: Secondary | ICD-10-CM | POA: Diagnosis not present

## 2018-06-13 DIAGNOSIS — M79644 Pain in right finger(s): Secondary | ICD-10-CM

## 2018-06-13 DIAGNOSIS — M85841 Other specified disorders of bone density and structure, right hand: Secondary | ICD-10-CM | POA: Insufficient documentation

## 2018-06-13 DIAGNOSIS — M189 Osteoarthritis of first carpometacarpal joint, unspecified: Secondary | ICD-10-CM | POA: Diagnosis not present

## 2018-06-13 DIAGNOSIS — R5383 Other fatigue: Secondary | ICD-10-CM

## 2018-06-13 DIAGNOSIS — R7989 Other specified abnormal findings of blood chemistry: Secondary | ICD-10-CM | POA: Diagnosis not present

## 2018-06-13 DIAGNOSIS — L03011 Cellulitis of right finger: Secondary | ICD-10-CM

## 2018-06-13 DIAGNOSIS — M19041 Primary osteoarthritis, right hand: Secondary | ICD-10-CM | POA: Diagnosis not present

## 2018-06-13 LAB — CBC WITH DIFFERENTIAL/PLATELET
BASOS ABS: 48 {cells}/uL (ref 0–200)
Basophils Relative: 1 %
Eosinophils Absolute: 101 cells/uL (ref 15–500)
Eosinophils Relative: 2.1 %
HCT: 38 % (ref 35.0–45.0)
Hemoglobin: 12.7 g/dL (ref 11.7–15.5)
Lymphs Abs: 1166 cells/uL (ref 850–3900)
MCH: 29.5 pg (ref 27.0–33.0)
MCHC: 33.4 g/dL (ref 32.0–36.0)
MCV: 88.2 fL (ref 80.0–100.0)
MONOS PCT: 11.6 %
MPV: 11.3 fL (ref 7.5–12.5)
NEUTROS PCT: 61 %
Neutro Abs: 2928 cells/uL (ref 1500–7800)
PLATELETS: 181 10*3/uL (ref 140–400)
RBC: 4.31 10*6/uL (ref 3.80–5.10)
RDW: 12.6 % (ref 11.0–15.0)
TOTAL LYMPHOCYTE: 24.3 %
WBC: 4.8 10*3/uL (ref 3.8–10.8)
WBCMIX: 557 {cells}/uL (ref 200–950)

## 2018-06-13 LAB — TSH: TSH: 4.32 m[IU]/L (ref 0.40–4.50)

## 2018-06-13 LAB — T4, FREE: Free T4: 1 ng/dL (ref 0.8–1.8)

## 2018-06-13 MED ORDER — SULFAMETHOXAZOLE-TRIMETHOPRIM 400-80 MG PO TABS: 1.0000 | ORAL_TABLET | Freq: Two times a day (BID) | ORAL | 0 refills | Status: DC

## 2018-06-13 MED ORDER — BENZONATATE 100 MG PO CAPS: 100.0000 mg | ORAL_CAPSULE | Freq: Three times a day (TID) | ORAL | 0 refills | Status: DC | PRN

## 2018-06-13 NOTE — Patient Instructions (Signed)
You do appear to have bronchitis and will get x-ray to make sure no pneumonia present.  I am making benzonatate cough tablet available in the event you have severe cough.  Rx advisement given regarding sedation.  You appear to have recent paronychia and concern for skin infection of the pad on thumb as well.   I prescribed Bactrim antibiotic for potential thumb infection and bronchitis.  If x-ray would show pneumonia then might need to change the antibiotic choice.  You can soak your thumb in warm salt water twice daily for possibly 10 minutes.  If your thumb worsens despite treatment or respiratory symptoms worsen/change then please let us know.  Follow-up in 7 days or as needed.

## 2018-06-13 NOTE — Telephone Encounter (Signed)
Called patient to schedule appointment. Patient states she has already been scheduled appointment for today regarding he thumb infection. Patient did not mention her cough.

## 2018-06-13 NOTE — Progress Notes (Signed)
Subjective:    Patient ID: Tracey Holt, female    DOB: 09/25/1922, 84 y.o.   MRN: [id number]  HPI  Pt in some recurrent cough for about one week. Some productive cough but rare. No fever, no chills or sweats. She does feel tired. No sinus pressure.    Pt did have mild elevated tsh in past/2 weeks ago. Will repeat tsh and t4.  Pt has htn. She is only on amlodipine. No cardiac or neurologic signs or symptoms.  She also has slight redness at base of her rt thumb nail. Mild red and tender.  About 3 weeks ago I prescribed zpack for bronchitis and mupirocin for her thumb/paronychia. Thumb has not improved. She reports nail tender when manipulated. And thumb is mild  Swollen. Last time I saw her did rx mupirocin and advised her to apply to base of thumb nail twice daily.  She describes that her bronchitis symptoms  got better up until one week ago when symptoms seemed to recoccur.   Review of Systems  Constitutional: Positive for fatigue. Negative for chills and fever.  HENT: Negative for congestion, ear pain, sinus pressure and sinus pain.   Respiratory: Positive for cough. Negative for chest tightness, shortness of breath and wheezing.   Cardiovascular: Negative for chest pain and palpitations.  Gastrointestinal: Negative for abdominal pain.  Musculoskeletal:       Rt thumb pain  Skin: Negative for rash.       Mild pain base of thumb rt side.  Hematological: Negative for adenopathy. Does not bruise/bleed easily.  Psychiatric/Behavioral: Negative for behavioral problems and confusion.    Past Medical History:  Diagnosis Date  . Bradycardic cardiac arrest ([place]) 07/24/2016   Required CPR, intubation with multiple rib fractures. Had short term cardiac shock with acute heart failure.  Resulted in possible stress-induced cardiomyopathy  . Cardiac related syncope 07/24/2016   Bradycardic arrest requiring CPR 2 with transvenous pacemaker placed followed by permanent pacemaker;  complicated by respiratory arrest, type II MI, stress-induced cardiopathy  . Cardiomyopathy ([place]) 07/2016   Moderate severely reduced EF with apical hypokinesis suggestive of stress-induced cardiac myopathy versus multivessel CAD --> in setting of her to cardiac arrest  . COPD (chronic obstructive pulmonary disease) ([place])    By report  . History of chronic constipation   . Hypercholesterolemia   . Osteoporosis   . Pacemaker 07/26/2016   Abbott dual-chamber pacemaker: [employer] #[id number] - Serial [id number]  . Pneumonia 08/2016  . Sarcoidosis of lung ([place])   . Spinal stenosis      Social History   Socioeconomic History  . Marital status: Widowed    Spouse name: Not on file  . Number of children: Not on file  . Years of education: Not on file  . Highest education level: Not on file  Occupational History  . Not on file  Social Needs  . Financial resource strain: Not on file  . Food insecurity:    Worry: Not on file    Inability: Not on file  . Transportation needs:    Medical: Not on file    Non-medical: Not on file  Tobacco Use  . Smoking status: Former Smoker    Packs/day: 1.50    Years: 17.00    Pack years: 25.50    Types: Cigarettes    Start date: 12/18/1963    Last attempt to quit: 11/20/1983    Years since quitting: 34.5  . Smokeless tobacco: Never Used  Substance and Sexual  Activity  . Alcohol use: No  . Drug use: No  . Sexual activity: Not on file  Lifestyle  . Physical activity:    Days per week: Not on file    Minutes per session: Not on file  . Stress: Not on file  Relationships  . Social connections:    Talks on phone: Not on file    Gets together: Not on file    Attends religious service: Not on file    Active member of club or organization: Not on file    Attends meetings of clubs or organizations: Not on file    Relationship status: Not on file  . Intimate partner violence:    Fear of current or ex partner: Not on file    Emotionally  abused: Not on file    Physically abused: Not on file    Forced sexual activity: Not on file  Other Topics Concern  . Not on file  Social History Narrative   Recently moved to [place] from [place] to live near her daughter.    Past Surgical History:  Procedure Laterality Date  . ADENOIDECTOMY    . PACEMAKER INSERTION Left 07/26/2016   Abbott Assurity Model #[id number] - Serial #[id number] -- complicated by pneumothorax requiring chest tube placement  . TEE WITHOUT CARDIOVERSION  02/2013   EF 55-60% with normal global function. No thrombus, mass or vegetation. Trivial-small pericardial effusion. Moderate LA dilation. Mild MR.  . TONSILLECTOMY    . TRANSTHORACIC ECHOCARDIOGRAM  07/24/2016   [facility]: EF 45-50%. Mid and apical inferior, inferolateral and apical hypokinesis. GR 2 DD. Mild LA and moderate RA dilation. Mild paracardial effusion. Moderate to severe TR. Basal and mid segment hypokinesis with apical hypokinesis. - Suspect stress-induced cardiopathy versus multivessel CAD.  Marland Kitchen TRANSTHORACIC ECHOCARDIOGRAM  07/26/2016   [place]: LIMITED (post PPM) - although the report suggested EF 30-35% with apical akinesis, rounding note suggested this was an improvement    Family History  Problem Relation Age of Onset  . Allergic rhinitis Neg Hx   . Angioedema Neg Hx   . Asthma Neg Hx   . Eczema Neg Hx   . Immunodeficiency Neg Hx   . Urticaria Neg Hx     Allergies  Allergen Reactions  . Dust Mite Extract Shortness Of Breath  . Lidex [Fluocinonide] Swelling    Caused lip swelling  . Molds & Smuts Shortness Of Breath  . Lactose   . Levaquin [Levofloxacin In D5w]     Couldn't breath   . Penicillins     Arms swelling   . Rifampin     Couldn't breath     Current Outpatient Medications on File Prior to Visit  Medication Sig Dispense Refill  . acetaminophen (TYLENOL) 500 MG tablet Take 500-1,000 mg by mouth every 6 (six)  hours as needed for moderate pain or headache.    . albuterol (PROVENTIL HFA;VENTOLIN HFA) 108 (90 Base) MCG/ACT inhaler Inhale 2 puffs into the lungs every 6 (six) hours as needed for wheezing or shortness of breath. 1 Inhaler 2  . amLODipine (NORVASC) 2.5 MG tablet Take 1 tablet (2.5 mg total) by mouth daily. 30 tablet 5  . Ascorbic Acid (VITAMIN C) 1000 MG tablet Take 1,000 mg by mouth daily.    Marland Kitchen atorvastatin (LIPITOR) 10 MG tablet Take 1/2 tablet (5mg ) by mouth daily 45 tablet 3  . azelastine (ASTELIN) 0.1 % nasal spray Place 1 spray into both nostrils 2 (two)  times daily. Use in each nostril as directed 30 mL 0  . azithromycin (ZITHROMAX) 250 MG tablet Take 2 tablets by mouth on day 1, followed by 1 tablet by mouth daily for 4 days. 6 tablet 0  . B Complex Vitamins (VITAMIN B COMPLEX) TABS Take by mouth.    . Biotin 10000 MCG TABS Take 10,000 mcg by mouth daily.     . Calcium Citrate-Vitamin D (CITRACAL + D PO) Take 1 tablet by mouth 2 (two) times daily.    . ciclopirox (PENLAC) 8 % solution Apply topically at bedtime. Apply over nail and surrounding skin. Apply daily over previous coat. After seven (7) days, may remove with alcohol and continue cycle. (Patient taking differently: Apply 1 application topically at bedtime. Apply over nail and surrounding skin. Apply daily over previous coat. After seven (7) days, may remove with alcohol and continue cycle.) 6.6 mL 0  . Coenzyme Q10 (CO Q 10) 100 MG CAPS Take 100 mg by mouth daily.    Marland Kitchen docusate sodium (COLACE) 100 MG capsule Take 100 mg by mouth daily.    . fluticasone (FLONASE) 50 MCG/ACT nasal spray Place 1 spray into both nostrils daily.    . hydroxypropyl methylcellulose / hypromellose (ISOPTO TEARS / GONIOVISC) 2.5 % ophthalmic solution Place 1 drop into both eyes 3 (three) times daily as needed for dry eyes.    Marland Kitchen ipratropium (ATROVENT HFA) 17 MCG/ACT inhaler Inhale 2 puffs into the lungs every 6 (six) hours as needed for wheezing. 1 Inhaler  2  . LUTEIN PO Take 1 tablet by mouth daily.    . Misc Natural Products (GLUCOS-CHONDROIT-MSM COMPLEX) TABS Take 2 tablets by mouth daily.    . Multiple Vitamins-Minerals (MULTIVITAMIN WITH MINERALS) tablet Take 1 tablet by mouth daily.    . mupirocin cream (BACTROBAN) 2 % Apply 1 application topically 2 (two) times daily. 30 g 0  . mupirocin ointment (BACTROBAN) 2 % Apply 1 application topically 2 (two) times daily as needed. 22 g   . Probiotic CAPS Take 1 capsule by mouth daily.    Marland Kitchen sulfamethoxazole-trimethoprim (BACTRIM) 400-80 MG tablet Take 1 tablet by mouth 2 (two) times daily. Generic ok 14 tablet 0   No current facility-administered medications on file prior to visit.     BP 125/78   Pulse 88   Temp (!) 97.5 F (36.4 C) (Oral)   Resp 16   Ht 4\' 11"  (0.093 m)   Wt 93 lb 12.8 oz (42.5 kg)   SpO2 98%   BMI 18.95 kg/m      Objective:   Physical Exam  General  Mental Status - Alert. General Appearance - Well groomed. Not in acute distress.  Skin Rashes- No Rashes.  HEENT Head- Normal. Ear Auditory Canal - Left- Normal. Right - Normal.Tympanic Membrane- Left- Normal. Right- Normal. Eye Sclera/Conjunctiva- Left- Normal. Right- Normal. Nose & Sinuses Nasal Mucosa- Left-  Boggy and Congested. Right-  Boggy and  Congested.Bilateral no  maxillary and no  frontal sinus pressure. Mouth & Throat Lips: Upper Lip- Normal: no dryness, cracking, pallor, cyanosis, or vesicular eruption. Lower Lip-Normal: no dryness, cracking, pallor, cyanosis or vesicular eruption. Buccal Mucosa- Bilateral- No Aphthous ulcers. Oropharynx- No Discharge or Erythema. Tonsils: Characteristics- Bilateral- No Erythema or Congestion. Size/Enlargement- Bilateral- No enlargement. Discharge- bilateral-None.  Neck Neck- Supple. No Masses.   Chest and Lung Exam Auscultation: Breath Sounds:- even and unlabored but rough upper breath sounds.  Cardiovascular Auscultation:Rythm- Regular, rate and  rhythm. Murmurs & Other  Heart Sounds:Ausculatation of the heart reveal- No Murmurs.  Lymphatic Head & Neck General Head & Neck Lymphatics: Bilateral: Description- No Localized lymphadenopathy.   Rt thumb- mild pink appearance to distal tip of thumb Mlld tender just at distal nail edge. Faint pink/redness at pad.The pad does look more mild swollen more than last time but pad is not tender to palpation.        Assessment & Plan:  You do appear to have bronchitis and will get x-ray to make sure no pneumonia present.  I am making benzonatate cough tablet available in the event you have severe cough.  Rx advisement given regarding sedation.  You appear to have recent paronychia and concern for skin infection of the pad on thumb as well.   I prescribed Bactrim antibiotic for potential thumb infection and bronchitis.  If x-ray would show pneumonia then might need to change the antibiotic choice.  You can soak your thumb in warm salt water twice daily for possibly 10 minutes.  If your thumb worsens despite treatment or respiratory symptoms worsen/change then please let us know.  Follow-up in 7 days or as needed.  Mackie Pai, PA-C

## 2018-06-18 ENCOUNTER — Ambulatory Visit (INDEPENDENT_AMBULATORY_CARE_PROVIDER_SITE_OTHER): Payer: Medicare HMO | Admitting: *Deleted

## 2018-06-18 ENCOUNTER — Encounter: Payer: Self-pay | Admitting: Cardiology

## 2018-06-18 DIAGNOSIS — I442 Atrioventricular block, complete: Secondary | ICD-10-CM

## 2018-06-18 NOTE — Progress Notes (Signed)
Remote pacemaker transmission.   

## 2018-06-18 NOTE — Progress Notes (Signed)
Letter  

## 2018-06-25 ENCOUNTER — Telehealth: Payer: Self-pay

## 2018-06-25 ENCOUNTER — Ambulatory Visit: Payer: Self-pay | Admitting: *Deleted

## 2018-06-25 DIAGNOSIS — C44311 Basal cell carcinoma of skin of nose: Secondary | ICD-10-CM | POA: Diagnosis not present

## 2018-06-25 DIAGNOSIS — R609 Edema, unspecified: Secondary | ICD-10-CM | POA: Diagnosis not present

## 2018-06-25 NOTE — Telephone Encounter (Signed)
Pt called with complaints of left hand pain, and swelling which started on 06/24/18; the pt states that her fingers on that hand are also stiff; recommendations made per nurse triage protocol to include going to ED now; she verbalizes understanding and says that her son will take her; will route to office for notification of this encounter.  Reason for Disposition . Patient sounds very sick or weak to the triager  Answer Assessment - Initial Assessment Questions 1. ONSET: "When did the pain start?"     06/24/18 2. LOCATION: "Where is the pain located?"     Left palm 3. PAIN: "How bad is the pain?" (Scale 1-10; or mild, moderate, severe)   - MILD (1-3): doesn't interfere with normal activities   - MODERATE (4-7): interferes with normal activities (e.g., work or school) or awakens from sleep   - SEVERE (8-10): excruciating pain, unable to use hand at all     severe 4. WORK OR EXERCISE: "Has there been any recent work or exercise that involved this part of the body?"     no 5. CAUSE: "What do you think is causing the pain?"     unsure 6. AGGRAVATING FACTORS: "What makes the pain worse?" (e.g., using computer)     no 7. OTHER SYMPTOMS: "Do you have any other symptoms?" (e.g., neck pain, swelling, rash, numbness, fever)     swelling 8. PREGNANCY: "Is there any chance you are pregnant?" "When was your last menstrual period?"     no  Protocols used: HAND AND WRIST PAIN-A-AH

## 2018-06-25 NOTE — Telephone Encounter (Signed)
Needs OV for further evaluation please.

## 2018-06-25 NOTE — Telephone Encounter (Signed)
Copied from [place] [id number]. Topic: General - Other >> Jun 24, 2018  1:09 PM Keene Breath wrote: Reason for CRM: Patient called to inform doctor that she thinks that her BP medication amLODipine (NORVASC) 2.5 MG tablet is making her feel very tired.  She has no energy and thinks that this is the reason.  Patient did not want to make an appointment to come in to see doctor.  Please advise.  CB# [phone].

## 2018-06-26 NOTE — Telephone Encounter (Signed)
LVM for pt to call the office and schedule an OV regarding about her BP medication with provider.

## 2018-07-08 DIAGNOSIS — Z85828 Personal history of other malignant neoplasm of skin: Secondary | ICD-10-CM | POA: Diagnosis not present

## 2018-07-08 DIAGNOSIS — C44311 Basal cell carcinoma of skin of nose: Secondary | ICD-10-CM | POA: Diagnosis not present

## 2018-07-15 LAB — CUP PACEART REMOTE DEVICE CHECK
Brady Statistic AP VP Percent: 41 %
Brady Statistic AP VS Percent: 1 %
Brady Statistic AS VP Percent: 59 %
Brady Statistic RA Percent Paced: 41 %
Brady Statistic RV Percent Paced: 99 %
Date Time Interrogation Session: 20190731060012
Implantable Lead Implant Date: 20170907
Implantable Lead Location: 753860
Lead Channel Impedance Value: 410 Ohm
Lead Channel Pacing Threshold Pulse Width: 0.4 ms
Lead Channel Sensing Intrinsic Amplitude: 6.4 mV
Lead Channel Setting Pacing Amplitude: 2.5 V
MDC IDC LEAD IMPLANT DT: 20170907
MDC IDC LEAD LOCATION: 753859
MDC IDC MSMT BATTERY REMAINING LONGEVITY: 96 mo
MDC IDC MSMT BATTERY REMAINING PERCENTAGE: 95.5 %
MDC IDC MSMT BATTERY VOLTAGE: 2.99 V
MDC IDC MSMT LEADCHNL RA IMPEDANCE VALUE: 400 Ohm
MDC IDC MSMT LEADCHNL RA PACING THRESHOLD AMPLITUDE: 0.75 V
MDC IDC MSMT LEADCHNL RA SENSING INTR AMPL: 3.8 mV
MDC IDC MSMT LEADCHNL RV PACING THRESHOLD AMPLITUDE: 0.5 V
MDC IDC MSMT LEADCHNL RV PACING THRESHOLD PULSEWIDTH: 0.4 ms
MDC IDC PG IMPLANT DT: 20170907
MDC IDC SET LEADCHNL RV PACING AMPLITUDE: 2.5 V
MDC IDC SET LEADCHNL RV PACING PULSEWIDTH: 0.4 ms
MDC IDC SET LEADCHNL RV SENSING SENSITIVITY: 2 mV
MDC IDC STAT BRADY AS VS PERCENT: 1 %
Pulse Gen Model: 2272
Pulse Gen Serial Number: 7944486

## 2018-07-16 ENCOUNTER — Other Ambulatory Visit: Payer: Self-pay

## 2018-07-16 ENCOUNTER — Emergency Department (HOSPITAL_BASED_OUTPATIENT_CLINIC_OR_DEPARTMENT_OTHER)
Admission: EM | Admit: 2018-07-16 | Discharge: 2018-07-16 | Disposition: A | Discharge status: Home / Self Care | Payer: Medicare HMO | Attending: Emergency Medicine | Admitting: Emergency Medicine

## 2018-07-16 ENCOUNTER — Emergency Department (HOSPITAL_BASED_OUTPATIENT_CLINIC_OR_DEPARTMENT_OTHER): Payer: Medicare HMO

## 2018-07-16 ENCOUNTER — Encounter (HOSPITAL_BASED_OUTPATIENT_CLINIC_OR_DEPARTMENT_OTHER): Payer: Self-pay | Admitting: *Deleted

## 2018-07-16 DIAGNOSIS — Z79899 Other long term (current) drug therapy: Secondary | ICD-10-CM | POA: Diagnosis not present

## 2018-07-16 DIAGNOSIS — Z87891 Personal history of nicotine dependence: Secondary | ICD-10-CM | POA: Diagnosis not present

## 2018-07-16 DIAGNOSIS — Z950 Presence of cardiac pacemaker: Secondary | ICD-10-CM | POA: Diagnosis not present

## 2018-07-16 DIAGNOSIS — I5022 Chronic systolic (congestive) heart failure: Secondary | ICD-10-CM | POA: Insufficient documentation

## 2018-07-16 DIAGNOSIS — R0989 Other specified symptoms and signs involving the circulatory and respiratory systems: Secondary | ICD-10-CM | POA: Diagnosis not present

## 2018-07-16 DIAGNOSIS — J449 Chronic obstructive pulmonary disease, unspecified: Secondary | ICD-10-CM | POA: Insufficient documentation

## 2018-07-16 DIAGNOSIS — R1310 Dysphagia, unspecified: Secondary | ICD-10-CM | POA: Diagnosis not present

## 2018-07-16 NOTE — ED Triage Notes (Signed)
Pt states she feels like she have something stuck on her throat after having dinner tonight . Pt states she doesn't know what it is.

## 2018-07-16 NOTE — Discharge Instructions (Signed)
I suspect you have some minor irritation of your esophagus (food pipe). I do not think you have a food impaction/blockage. If you develop difficulty with breathing, increasing pain or inability to get food/fluid down then I want you immediately re-evaluated. Otherwise I expect this feeling to resolve within a couple days.

## 2018-07-25 NOTE — ED Provider Notes (Signed)
[facility] EMERGENCY DEPARTMENT Provider Note   CSN: [id number] Arrival date & time: 07/16/18  1918     History   Chief Complaint Chief Complaint  Patient presents with  . Sore Throat    HPI Tracey Holt is a 84 y.o. female.  HPI   84 year old female with foreign body sensation in her throat.  Began while she was eating dinner earlier tonight.  She has a mild discomfort which is worsened when swallowing.  She states that she cannot swallow foods and liquids though since onset.  No drooling.  No shortness of breath.  Past Medical History:  Diagnosis Date  . Bradycardic cardiac arrest ([place]) 07/24/2016   Required CPR, intubation with multiple rib fractures. Had short term cardiac shock with acute heart failure.  Resulted in possible stress-induced cardiomyopathy  . Cardiac related syncope 07/24/2016   Bradycardic arrest requiring CPR 2 with transvenous pacemaker placed followed by permanent pacemaker; complicated by respiratory arrest, type II MI, stress-induced cardiopathy  . Cardiomyopathy ([place]) 07/2016   Moderate severely reduced EF with apical hypokinesis suggestive of stress-induced cardiac myopathy versus multivessel CAD --> in setting of her to cardiac arrest  . COPD (chronic obstructive pulmonary disease) ([place])    By report  . History of chronic constipation   . Hypercholesterolemia   . Osteoporosis   . Pacemaker 07/26/2016   Abbott dual-chamber pacemaker: [place] #[id number] - Serial [id number]  . Pneumonia 08/2016  . Sarcoidosis of lung ([place])   . Spinal stenosis     Patient Active Problem List   Diagnosis Date Noted  . Intractable headache 04/07/2018  . Abnormal CT of the abdomen 04/07/2018  . CHB (complete heart block) ([place]) 12/20/2016  . Chronic systolic congestive heart failure ([place]) 12/20/2016  . Angioedema 12/17/2016  . Chronic rhinitis 12/17/2016  . Dyspnea 11/09/2016  . Upper airway cough syndrome 10/29/2016  . Abnormal CT of the  chest  c/w lipoid pna 10/29/2016  . Toe ulcer, right ([place]) 10/17/2016  . Community acquired pneumonia of left lower lobe of lung ([place]) 09/19/2016  . S/P placement of cardiac pacemaker 09/19/2016  . History of syncope 09/19/2016  . Bradycardic cardiac arrest ([place]) 07/24/2016    Past Surgical History:  Procedure Laterality Date  . ADENOIDECTOMY    . PACEMAKER INSERTION Left 07/26/2016   Abbott Assurity Model #[id number] - Serial #[id number] -- complicated by pneumothorax requiring chest tube placement  . TEE WITHOUT CARDIOVERSION  02/2013   EF 55-60% with normal global function. No thrombus, mass or vegetation. Trivial-small pericardial effusion. Moderate LA dilation. Mild MR.  . TONSILLECTOMY    . TRANSTHORACIC ECHOCARDIOGRAM  07/24/2016   [facility]: EF 45-50%. Mid and apical inferior, inferolateral and apical hypokinesis. GR 2 DD. Mild LA and moderate RA dilation. Mild paracardial effusion. Moderate to severe TR. Basal and mid segment hypokinesis with apical hypokinesis. - Suspect stress-induced cardiopathy versus multivessel CAD.  Marland Kitchen TRANSTHORACIC ECHOCARDIOGRAM  07/26/2016   [place]: LIMITED (post PPM) - although the report suggested EF 30-35% with apical akinesis, rounding note suggested this was an improvement     OB History   None      Home Medications    Prior to Admission medications   Medication Sig Start Date End Date Taking? Authorizing Provider  acetaminophen (TYLENOL) 500 MG tablet Take 500-1,000 mg by mouth every 6 (six) hours as needed for moderate pain or headache.    [provider]  albuterol (PROVENTIL HFA;VENTOLIN HFA) 108 (  90 Base) MCG/ACT inhaler Inhale 2 puffs into the lungs every 6 (six) hours as needed for wheezing or shortness of breath. 01/02/18   Saguier, Percell Miller, PA-C  amLODipine (NORVASC) 2.5 MG tablet Take 1 tablet (2.5 mg total) by mouth daily. 05/16/18   Saguier, Percell Miller, PA-C  Ascorbic Acid (VITAMIN  C) 1000 MG tablet Take 1,000 mg by mouth daily.    [provider]  atorvastatin (LIPITOR) 10 MG tablet Take 1/2 tablet (5mg ) by mouth daily 03/08/17   [place], Gay Filler, MD  azelastine (ASTELIN) 0.1 % nasal spray Place 1 spray into both nostrils 2 (two) times daily. Use in each nostril as directed 01/08/18   Saguier, Percell Miller, PA-C  B Complex Vitamins (VITAMIN B COMPLEX) TABS Take by mouth.    [provider]  benzonatate (TESSALON) 100 MG capsule Take 1 capsule (100 mg total) by mouth 3 (three) times daily as needed for cough. 06/13/18   Saguier, Percell Miller, PA-C  Biotin 10000 MCG TABS Take 10,000 mcg by mouth daily.     [provider]  Calcium Citrate-Vitamin D (CITRACAL + D PO) Take 1 tablet by mouth 2 (two) times daily.    [provider]  ciclopirox (PENLAC) 8 % solution Apply topically at bedtime. Apply over nail and surrounding skin. Apply daily over previous coat. After seven (7) days, may remove with alcohol and continue cycle. Patient taking differently: Apply 1 application topically at bedtime. Apply over nail and surrounding skin. Apply daily over previous coat. After seven (7) days, may remove with alcohol and continue cycle. 12/04/17   Saguier, Percell Miller, PA-C  Coenzyme Q10 (CO Q 10) 100 MG CAPS Take 100 mg by mouth daily.    [provider]  docusate sodium (COLACE) 100 MG capsule Take 100 mg by mouth daily.    [provider]  fluticasone (FLONASE) 50 MCG/ACT nasal spray Place 1 spray into both nostrils daily.    [provider]  hydroxypropyl methylcellulose / hypromellose (ISOPTO TEARS / GONIOVISC) 2.5 % ophthalmic solution Place 1 drop into both eyes 3 (three) times daily as needed for dry eyes.    [provider]  ipratropium (ATROVENT HFA) 17 MCG/ACT inhaler Inhale 2 puffs into the lungs every 6 (six) hours as needed for wheezing. 01/08/18   Saguier, Percell Miller, PA-C  LUTEIN PO Take 1 tablet by mouth daily.    [provider]  Misc Natural Products (GLUCOS-CHONDROIT-MSM COMPLEX) TABS Take 2 tablets by mouth daily.    [provider]  Multiple Vitamins-Minerals (MULTIVITAMIN WITH MINERALS) tablet Take 1 tablet by mouth daily.    [provider]  mupirocin cream (BACTROBAN) 2 % Apply 1 application topically 2 (two) times daily. 04/16/18   Saguier, Percell Miller, PA-C  mupirocin ointment (BACTROBAN) 2 % Apply 1 application topically 2 (two) times daily as needed. 05/26/18   Saguier, Percell Miller, PA-C  Probiotic CAPS Take 1 capsule by mouth daily.    [provider]    Family History Family History  Problem Relation Age of Onset  . Allergic rhinitis Neg Hx   . Angioedema Neg Hx   . Asthma Neg Hx   . Eczema Neg Hx   . Immunodeficiency Neg Hx   . Urticaria Neg Hx     Social History Social History   Tobacco Use  . Smoking status: Former Smoker    Packs/day: 1.50    Years: 17.00    Pack years: 25.50    Types: Cigarettes    Start date: 12/18/1963  Last attempt to quit: 11/20/1983    Years since quitting: 34.7  . Smokeless tobacco: Never Used  Substance Use Topics  . Alcohol use: No  . Drug use: No     Allergies   Dust mite extract; Lidex [fluocinonide]; Molds & smuts; Lactose; Levaquin [levofloxacin in d5w]; Penicillins; and Rifampin   Review of Systems Review of Systems  All systems reviewed and negative, other than as noted in HPI.\  Physical Exam Updated Vital Signs BP (!) 148/87   Pulse 74   Temp 97.9 F (36.6 C)   Resp 16   Ht 4\' 10"  (1.473 m)   Wt 42.6 kg   SpO2 96%   BMI 19.65 kg/m   Physical Exam  Constitutional: She appears well-developed and well-nourished. No distress.  HENT:  Head: Normocephalic and atraumatic.  Mouth/Throat: Oropharynx is clear and moist. No oral lesions. No uvula swelling. No posterior oropharyngeal edema or posterior oropharyngeal erythema.  External neck is normal to inspection.  Trachea seems midline.  No stridor.  No neck  tenderness.  Posterior pharynx looks fine.  She is handling secretions.  Eyes: Conjunctivae are normal. Right eye exhibits no discharge. Left eye exhibits no discharge.  Neck: Neck supple.  Cardiovascular: Normal rate, regular rhythm and normal heart sounds. Exam reveals no gallop and no friction rub.  No murmur heard. Pulmonary/Chest: Effort normal and breath sounds normal. No respiratory distress.  Abdominal: Soft. She exhibits no distension. There is no tenderness.  Musculoskeletal: She exhibits no edema or tenderness.  Neurological: She is alert.  Skin: Skin is warm and dry.  Psychiatric: She has a normal mood and affect. Her behavior is normal. Thought content normal.  Nursing note and vitals reviewed.    ED Treatments / Results  Labs (all labs ordered are listed, but only abnormal results are displayed) Labs Reviewed - No data to display  EKG None  Radiology No results found.   Dg Neck Soft Tissue  Result Date: 07/16/2018 CLINICAL DATA:  Sensation of something stuck in throat. EXAM: NECK SOFT TISSUES - 1+ VIEW COMPARISON:  None. FINDINGS: No visible radiopaque foreign body. Advanced degenerative disc disease and facet disease throughout the cervical spine. Airways patent. Epiglottis normal. IMPRESSION: No visible radiopaque foreign body. Advanced spondylosis. Electronically Signed   By: Rolm Baptise M.D.   On: 07/16/2018 20:05    Procedures Procedures (including critical care time)  Medications Ordered in ED Medications - No data to display   Initial Impression / Assessment and Plan / ED Course  I have reviewed the triage vital signs and the nursing notes.  Pertinent labs & imaging results that were available during my care of the patient were reviewed by me and considered in my medical decision making (see chart for details).     84 year old female with neck pain/foreign body sensation.  I suspect that this is some esophageal irritation from what ever she was  eating at the time.  She is not describing symptoms and her exam is not consistent with esophageal obstruction, at least not complete.  I watched her drink in the emergency room without any difficulty although she did have burping after each time she swallowed.  Her son at bedside states that this is pretty typical of her though.  Her exam is reassuring.  Return precautions were discussed.  I expect that her symptoms should improve over the period the next couple days.  I have reviewed the triage vital signs and the nursing notes. Prior records were reviewed  for additional information.    Pertinent labs & imaging results that were available during my care of the patient were reviewed by me and considered in my medical decision making (see chart for details).   Final Clinical Impressions(s) / ED Diagnoses   Final diagnoses:  Foreign body sensation in throat    ED Discharge Orders    None       Virgel Manifold, MD 07/25/18 1129

## 2018-07-28 DIAGNOSIS — J069 Acute upper respiratory infection, unspecified: Secondary | ICD-10-CM | POA: Diagnosis not present

## 2018-08-13 ENCOUNTER — Emergency Department (HOSPITAL_COMMUNITY): Payer: Medicare HMO

## 2018-08-13 ENCOUNTER — Inpatient Hospital Stay (HOSPITAL_COMMUNITY): Payer: Medicare HMO

## 2018-08-13 ENCOUNTER — Inpatient Hospital Stay (HOSPITAL_COMMUNITY)
Admission: EM | Admit: 2018-08-13 | Discharge: 2018-08-15 | DRG: 640 | Disposition: A | Discharge status: Home / Self Care | Payer: Medicare HMO | Source: Skilled Nursing Facility | Attending: Family Medicine | Admitting: Family Medicine

## 2018-08-13 ENCOUNTER — Other Ambulatory Visit: Payer: Self-pay

## 2018-08-13 ENCOUNTER — Encounter (HOSPITAL_COMMUNITY): Payer: Self-pay | Admitting: Emergency Medicine

## 2018-08-13 DIAGNOSIS — Z880 Allergy status to penicillin: Secondary | ICD-10-CM

## 2018-08-13 DIAGNOSIS — E785 Hyperlipidemia, unspecified: Secondary | ICD-10-CM | POA: Diagnosis present

## 2018-08-13 DIAGNOSIS — I429 Cardiomyopathy, unspecified: Secondary | ICD-10-CM | POA: Diagnosis present

## 2018-08-13 DIAGNOSIS — Z87891 Personal history of nicotine dependence: Secondary | ICD-10-CM | POA: Diagnosis not present

## 2018-08-13 DIAGNOSIS — J9811 Atelectasis: Secondary | ICD-10-CM | POA: Diagnosis present

## 2018-08-13 DIAGNOSIS — Z8674 Personal history of sudden cardiac arrest: Secondary | ICD-10-CM

## 2018-08-13 DIAGNOSIS — R05 Cough: Secondary | ICD-10-CM | POA: Diagnosis present

## 2018-08-13 DIAGNOSIS — R058 Other specified cough: Secondary | ICD-10-CM | POA: Diagnosis present

## 2018-08-13 DIAGNOSIS — R0602 Shortness of breath: Secondary | ICD-10-CM | POA: Diagnosis not present

## 2018-08-13 DIAGNOSIS — E7800 Pure hypercholesterolemia, unspecified: Secondary | ICD-10-CM | POA: Diagnosis present

## 2018-08-13 DIAGNOSIS — G9341 Metabolic encephalopathy: Secondary | ICD-10-CM | POA: Diagnosis not present

## 2018-08-13 DIAGNOSIS — R51 Headache: Secondary | ICD-10-CM | POA: Diagnosis present

## 2018-08-13 DIAGNOSIS — Z7951 Long term (current) use of inhaled steroids: Secondary | ICD-10-CM

## 2018-08-13 DIAGNOSIS — I442 Atrioventricular block, complete: Secondary | ICD-10-CM | POA: Diagnosis not present

## 2018-08-13 DIAGNOSIS — Z950 Presence of cardiac pacemaker: Secondary | ICD-10-CM | POA: Diagnosis not present

## 2018-08-13 DIAGNOSIS — I5022 Chronic systolic (congestive) heart failure: Secondary | ICD-10-CM | POA: Diagnosis present

## 2018-08-13 DIAGNOSIS — R4182 Altered mental status, unspecified: Secondary | ICD-10-CM | POA: Diagnosis not present

## 2018-08-13 DIAGNOSIS — G92 Toxic encephalopathy: Secondary | ICD-10-CM | POA: Diagnosis present

## 2018-08-13 DIAGNOSIS — E739 Lactose intolerance, unspecified: Secondary | ICD-10-CM | POA: Diagnosis present

## 2018-08-13 DIAGNOSIS — D860 Sarcoidosis of lung: Secondary | ICD-10-CM | POA: Diagnosis present

## 2018-08-13 DIAGNOSIS — Z79899 Other long term (current) drug therapy: Secondary | ICD-10-CM

## 2018-08-13 DIAGNOSIS — I071 Rheumatic tricuspid insufficiency: Secondary | ICD-10-CM | POA: Diagnosis present

## 2018-08-13 DIAGNOSIS — M810 Age-related osteoporosis without current pathological fracture: Secondary | ICD-10-CM | POA: Diagnosis present

## 2018-08-13 DIAGNOSIS — E869 Volume depletion, unspecified: Secondary | ICD-10-CM | POA: Diagnosis not present

## 2018-08-13 DIAGNOSIS — R0902 Hypoxemia: Secondary | ICD-10-CM | POA: Diagnosis not present

## 2018-08-13 DIAGNOSIS — Z888 Allergy status to other drugs, medicaments and biological substances status: Secondary | ICD-10-CM

## 2018-08-13 DIAGNOSIS — I252 Old myocardial infarction: Secondary | ICD-10-CM

## 2018-08-13 DIAGNOSIS — J449 Chronic obstructive pulmonary disease, unspecified: Secondary | ICD-10-CM | POA: Diagnosis present

## 2018-08-13 DIAGNOSIS — R079 Chest pain, unspecified: Secondary | ICD-10-CM | POA: Diagnosis not present

## 2018-08-13 DIAGNOSIS — Z781 Physical restraint status: Secondary | ICD-10-CM

## 2018-08-13 DIAGNOSIS — R0789 Other chest pain: Secondary | ICD-10-CM | POA: Diagnosis not present

## 2018-08-13 DIAGNOSIS — J690 Pneumonitis due to inhalation of food and vomit: Secondary | ICD-10-CM | POA: Diagnosis not present

## 2018-08-13 DIAGNOSIS — R410 Disorientation, unspecified: Secondary | ICD-10-CM | POA: Diagnosis not present

## 2018-08-13 DIAGNOSIS — E871 Hypo-osmolality and hyponatremia: Principal | ICD-10-CM | POA: Diagnosis present

## 2018-08-13 DIAGNOSIS — I10 Essential (primary) hypertension: Secondary | ICD-10-CM | POA: Diagnosis not present

## 2018-08-13 DIAGNOSIS — K5909 Other constipation: Secondary | ICD-10-CM | POA: Diagnosis present

## 2018-08-13 LAB — BASIC METABOLIC PANEL
Anion gap: 8 (ref 5–15)
Anion gap: 7 (ref 5–15)
Anion gap: 6 (ref 5–15)
BUN: 7 mg/dL — ABNORMAL LOW (ref 8–23)
BUN: 6 mg/dL — ABNORMAL LOW (ref 8–23)
BUN: 5 mg/dL — ABNORMAL LOW (ref 8–23)
CHLORIDE: 87 mmol/L — AB (ref 98–111)
CHLORIDE: 88 mmol/L — AB (ref 98–111)
CHLORIDE: 94 mmol/L — AB (ref 98–111)
CO2: 25 mmol/L (ref 22–32)
CO2: 26 mmol/L (ref 22–32)
CO2: 27 mmol/L (ref 22–32)
Calcium: 7.7 mg/dL — ABNORMAL LOW (ref 8.9–10.3)
Calcium: 7.6 mg/dL — ABNORMAL LOW (ref 8.9–10.3)
Calcium: 8.3 mg/dL — ABNORMAL LOW (ref 8.9–10.3)
Creatinine, Ser: 0.48 mg/dL (ref 0.44–1.00)
Creatinine, Ser: 0.51 mg/dL (ref 0.44–1.00)
Creatinine, Ser: 0.5 mg/dL (ref 0.44–1.00)
GFR calc Af Amer: >60 mL/min (ref >60)
GFR calc Af Amer: >60 mL/min (ref >60)
GFR calc non Af Amer: >60 mL/min (ref >60)
GFR calc non Af Amer: >60 mL/min (ref >60)
GFR calc non Af Amer: >60 mL/min (ref >60)
Glucose, Bld: 119 mg/dL — ABNORMAL HIGH (ref 70–99)
Glucose, Bld: 126 mg/dL — ABNORMAL HIGH (ref 70–99)
Glucose, Bld: 103 mg/dL — ABNORMAL HIGH (ref 70–99)
POTASSIUM: 3.3 mmol/L — AB (ref 3.5–5.1)
POTASSIUM: 4 mmol/L (ref 3.5–5.1)
Potassium: 4 mmol/L (ref 3.5–5.1)
SODIUM: 120 mmol/L — AB (ref 135–145)
SODIUM: 121 mmol/L — AB (ref 135–145)
SODIUM: 127 mmol/L — AB (ref 135–145)

## 2018-08-13 LAB — GLUCOSE, CAPILLARY
GLUCOSE-CAPILLARY: 92 mg/dL (ref 70–99)
Glucose-Capillary: 121 mg/dL — ABNORMAL HIGH (ref 70–99)
Glucose-Capillary: 96 mg/dL (ref 70–99)

## 2018-08-13 LAB — BASIC METABOLIC PANEL WITH GFR
Anion gap: 6 (ref 5–15)
BUN: 5 mg/dL — ABNORMAL LOW (ref 8–23)
CO2: 26 mmol/L (ref 22–32)
Calcium: 8.1 mg/dL — ABNORMAL LOW (ref 8.9–10.3)
Chloride: 96 mmol/L — ABNORMAL LOW (ref 98–111)
Creatinine, Ser: 0.52 mg/dL (ref 0.44–1.00)
GFR calc Af Amer: >60 mL/min
GFR calc non Af Amer: >60 mL/min
Glucose, Bld: 97 mg/dL (ref 70–99)
Potassium: 3.7 mmol/L (ref 3.5–5.1)
Sodium: 128 mmol/L — ABNORMAL LOW (ref 135–145)

## 2018-08-13 LAB — URINALYSIS, ROUTINE W REFLEX MICROSCOPIC
Bilirubin Urine: NEGATIVE
Glucose, UA: NEGATIVE mg/dL
KETONES UR: 5 mg/dL — AB
Leukocytes, UA: NEGATIVE
NITRITE: NEGATIVE
PH: 8 (ref 5.0–8.0)
Protein, ur: NEGATIVE mg/dL
Specific Gravity, Urine: 1.005 (ref 1.005–1.030)

## 2018-08-13 LAB — COMPREHENSIVE METABOLIC PANEL
ALBUMIN: 3.7 g/dL (ref 3.5–5.0)
ALK PHOS: 55 U/L (ref 38–126)
ALT: 29 U/L (ref 0–44)
ANION GAP: 11 (ref 5–15)
AST: 44 U/L — ABNORMAL HIGH (ref 15–41)
BUN: 10 mg/dL (ref 8–23)
CALCIUM: 8.2 mg/dL — AB (ref 8.9–10.3)
CHLORIDE: 84 mmol/L — AB (ref 98–111)
CO2: 22 mmol/L (ref 22–32)
Creatinine, Ser: 0.5 mg/dL (ref 0.44–1.00)
GFR calc Af Amer: >60 mL/min (ref >60)
GFR calc non Af Amer: >60 mL/min (ref >60)
GLUCOSE: 114 mg/dL — AB (ref 70–99)
Potassium: 3.7 mmol/L (ref 3.5–5.1)
SODIUM: 117 mmol/L — AB (ref 135–145)
Total Bilirubin: 1.7 mg/dL — ABNORMAL HIGH (ref 0.3–1.2)
Total Protein: 5.9 g/dL — ABNORMAL LOW (ref 6.5–8.1)

## 2018-08-13 LAB — CBC WITH DIFFERENTIAL/PLATELET
ABS IMMATURE GRANULOCYTES: 0 10*3/uL (ref 0.0–0.1)
BASOS PCT: 1 %
Basophils Absolute: 0 10*3/uL (ref 0.0–0.1)
EOS PCT: 1 %
Eosinophils Absolute: 0 10*3/uL (ref 0.0–0.7)
HCT: 35.4 % — ABNORMAL LOW (ref 36.0–46.0)
HEMOGLOBIN: 11.7 g/dL — AB (ref 12.0–15.0)
Immature Granulocytes: 0 %
LYMPHS PCT: 16 %
Lymphs Abs: 0.9 10*3/uL (ref 0.7–4.0)
MCH: 29.3 pg (ref 26.0–34.0)
MCHC: 33.1 g/dL (ref 30.0–36.0)
MCV: 88.7 fL (ref 78.0–100.0)
Monocytes Absolute: 0.5 10*3/uL (ref 0.1–1.0)
Monocytes Relative: 9 %
NEUTROS ABS: 4.2 10*3/uL (ref 1.7–7.7)
Neutrophils Relative %: 73 %
PLATELETS: 180 10*3/uL (ref 150–400)
RBC: 3.99 MIL/uL (ref 3.87–5.11)
RDW: 13.4 % (ref 11.5–15.5)
WBC: 5.7 10*3/uL (ref 4.0–10.5)

## 2018-08-13 LAB — SODIUM, URINE, RANDOM
Sodium, Ur: 101 mmol/L
Sodium, Ur: 67 mmol/L

## 2018-08-13 LAB — SODIUM
Sodium: 119 mmol/L — CL (ref 135–145)
Sodium: 119 mmol/L — CL (ref 135–145)

## 2018-08-13 LAB — I-STAT TROPONIN, ED: TROPONIN I, POC: 0.01 ng/mL (ref 0.00–0.08)

## 2018-08-13 LAB — CBC
HCT: 34.1 % — ABNORMAL LOW (ref 36.0–46.0)
Hemoglobin: 11.4 g/dL — ABNORMAL LOW (ref 12.0–15.0)
MCH: 29.5 pg (ref 26.0–34.0)
MCHC: 33.4 g/dL (ref 30.0–36.0)
MCV: 88.3 fL (ref 78.0–100.0)
Platelets: 145 10*3/uL — ABNORMAL LOW (ref 150–400)
RBC: 3.86 MIL/uL — ABNORMAL LOW (ref 3.87–5.11)
RDW: 13.1 % (ref 11.5–15.5)
WBC: 6.4 10*3/uL (ref 4.0–10.5)

## 2018-08-13 LAB — OSMOLALITY
OSMOLALITY: 247 mosm/kg — AB (ref 275–295)
OSMOLALITY: 244 mosm/kg — AB (ref 275–295)

## 2018-08-13 LAB — VITAMIN B12: Vitamin B-12: 1067 pg/mL — ABNORMAL HIGH (ref 180–914)

## 2018-08-13 LAB — MRSA PCR SCREENING: MRSA by PCR: NEGATIVE

## 2018-08-13 LAB — BRAIN NATRIURETIC PEPTIDE: B Natriuretic Peptide: 90.7 pg/mL (ref 0.0–100.0)

## 2018-08-13 LAB — TSH: TSH: 3.36 u[IU]/mL (ref 0.350–4.500)

## 2018-08-13 LAB — OSMOLALITY, URINE
Osmolality, Ur: 282 mOsm/kg — ABNORMAL LOW (ref 300–900)
Osmolality, Ur: 360 mosm/kg (ref 300–900)

## 2018-08-13 MED ORDER — FENTANYL CITRATE (PF) 100 MCG/2ML IJ SOLN: 25.0000 ug | Freq: Once | INTRAMUSCULAR | Status: DC

## 2018-08-13 MED ORDER — ENOXAPARIN SODIUM 30 MG/0.3ML ~~LOC~~ SOLN
30.0000 mg | SUBCUTANEOUS | Status: DC
  Administered 2018-08-13 – 2018-08-15 (×3): 30 mg via SUBCUTANEOUS
  Filled 2018-08-13 (×4): qty 0.3

## 2018-08-13 MED ORDER — LORAZEPAM 2 MG/ML IJ SOLN
0.5000 mg | Freq: Once | INTRAMUSCULAR | Status: AC
  Administered 2018-08-13: 0.5 mg via INTRAVENOUS
  Filled 2018-08-13: qty 1

## 2018-08-13 MED ORDER — ACETAMINOPHEN 325 MG PO TABS
650.0000 mg | ORAL_TABLET | Freq: Four times a day (QID) | ORAL | Status: DC | PRN
  Administered 2018-08-13 – 2018-08-15 (×4): 650 mg via ORAL
  Filled 2018-08-13 (×6): qty 2

## 2018-08-13 MED ORDER — SODIUM CHLORIDE 0.9 % IV SOLN
INTRAVENOUS | Status: DC
  Filled 2018-08-13: qty 1000

## 2018-08-13 MED ORDER — ALBUTEROL SULFATE (2.5 MG/3ML) 0.083% IN NEBU: 5.0000 mg | INHALATION_SOLUTION | Freq: Once | RESPIRATORY_TRACT | Status: DC

## 2018-08-13 MED ORDER — SODIUM CHLORIDE 3 % IV SOLN
INTRAVENOUS | Status: DC
  Administered 2018-08-13: 30 mL/h via INTRAVENOUS
  Filled 2018-08-13: qty 500

## 2018-08-13 MED ORDER — ONDANSETRON HCL 4 MG PO TABS: 4.0000 mg | ORAL_TABLET | Freq: Four times a day (QID) | ORAL | Status: DC | PRN

## 2018-08-13 MED ORDER — SODIUM CHLORIDE 0.9 % IV SOLN
3.0000 g | Freq: Two times a day (BID) | INTRAVENOUS | Status: DC
  Administered 2018-08-13 – 2018-08-14 (×3): 3 g via INTRAVENOUS
  Filled 2018-08-13 (×3): qty 3

## 2018-08-13 MED ORDER — METHOCARBAMOL 1000 MG/10ML IJ SOLN
500.0000 mg | Freq: Once | INTRAVENOUS | Status: DC
  Filled 2018-08-13: qty 5

## 2018-08-13 MED ORDER — SODIUM CHLORIDE 0.9 % IV BOLUS
250.0000 mL | Freq: Once | INTRAVENOUS | Status: AC
  Administered 2018-08-13: 250 mL via INTRAVENOUS

## 2018-08-13 MED ORDER — HALOPERIDOL LACTATE 5 MG/ML IJ SOLN
0.5000 mg | Freq: Four times a day (QID) | INTRAMUSCULAR | Status: DC | PRN
  Administered 2018-08-13 – 2018-08-14 (×3): 0.5 mg via INTRAVENOUS
  Filled 2018-08-13 (×4): qty 1

## 2018-08-13 MED ORDER — SODIUM CHLORIDE 3 % IV SOLN: INTRAVENOUS | Status: DC

## 2018-08-13 MED ORDER — SODIUM CHLORIDE 0.9 % IV BOLUS
1000.0000 mL | Freq: Once | INTRAVENOUS | Status: DC
  Administered 2018-08-13: 1000 mL via INTRAVENOUS

## 2018-08-13 MED ORDER — SODIUM CHLORIDE 0.9 % IV SOLN
INTRAVENOUS | Status: DC
  Administered 2018-08-13: 15:00:00 via INTRAVENOUS

## 2018-08-13 MED ORDER — POTASSIUM CHLORIDE IN NACL 40-0.9 MEQ/L-% IV SOLN
INTRAVENOUS | Status: DC
  Administered 2018-08-13: 50 mL/h via INTRAVENOUS
  Filled 2018-08-13: qty 1000

## 2018-08-13 MED ORDER — SODIUM CHLORIDE 0.9 % IV BOLUS: 250.0000 mL | Freq: Once | INTRAVENOUS | Status: AC

## 2018-08-13 MED ORDER — ONDANSETRON HCL 4 MG/2ML IJ SOLN
4.0000 mg | Freq: Once | INTRAMUSCULAR | Status: AC
  Administered 2018-08-13: 4 mg via INTRAVENOUS
  Filled 2018-08-13 (×2): qty 2

## 2018-08-13 MED ORDER — ACETAMINOPHEN 650 MG RE SUPP: 650.0000 mg | Freq: Four times a day (QID) | RECTAL | Status: DC | PRN

## 2018-08-13 MED ORDER — ONDANSETRON HCL 4 MG/2ML IJ SOLN: 4.0000 mg | Freq: Four times a day (QID) | INTRAMUSCULAR | Status: DC | PRN

## 2018-08-13 MED ORDER — PIPERACILLIN-TAZOBACTAM 3.375 G IVPB 30 MIN
3.3750 g | Freq: Once | INTRAVENOUS | Status: AC
  Administered 2018-08-13: 3.375 g via INTRAVENOUS
  Filled 2018-08-13: qty 50

## 2018-08-13 MED ORDER — ACETAMINOPHEN 500 MG PO TABS
1000.0000 mg | ORAL_TABLET | Freq: Once | ORAL | Status: AC
  Administered 2018-08-13: 1000 mg via ORAL
  Filled 2018-08-13: qty 2

## 2018-08-13 NOTE — ED Provider Notes (Addendum)
[facility] EMERGENCY DEPARTMENT Provider Note  CSN: [id number] Arrival date & time: 08/13/18 0245  Chief Complaint(s) Shortness of Breath  Triage Note 0251 Pt BIB EMS from [facility] assisted living center. Pt told staff that she wasn't able to sleep at all last night because she was SOB. Denies CP, N/V/D. Per EMS, no wheezing or rhonci noted. SpO2 93% on RA, so placed on 2Lpm via [place], which incr'd sats to 98%. Per EMS, pt initially insisting on transport to doctor's office or [facility]. Brought here d/t COPD, CHF.     HPI Tracey Holt is a 84 y.o. female here the patient is only complaining of neck and occipital headache.  Cannot provide other details regarding this.  Oriented to person and place but not time.   Remainder of history, ROS, and physical exam limited due to patient's condition (AMS). Additional information was obtained from EMS.   Level V Caveat.    HPI  Past Medical History Past Medical History:  Diagnosis Date  . Bradycardic cardiac arrest ([place]) 07/24/2016   Required CPR, intubation with multiple rib fractures. Had short term cardiac shock with acute heart failure.  Resulted in possible stress-induced cardiomyopathy  . Cardiac related syncope 07/24/2016   Bradycardic arrest requiring CPR 2 with transvenous pacemaker placed followed by permanent pacemaker; complicated by respiratory arrest, type II MI, stress-induced cardiopathy  . Cardiomyopathy ([place]) 07/2016   Moderate severely reduced EF with apical hypokinesis suggestive of stress-induced cardiac myopathy versus multivessel CAD --> in setting of her to cardiac arrest  . COPD (chronic obstructive pulmonary disease) ([place])    By report  . History of chronic constipation   . Hypercholesterolemia   . Osteoporosis   . Pacemaker 07/26/2016   Abbott dual-chamber pacemaker: [place] #[id number] - Serial [id number]  . Pneumonia 08/2016  . Sarcoidosis of lung ([place])   .  Spinal stenosis    Patient Active Problem List   Diagnosis Date Noted  . Hyponatremia 08/13/2018  . Intractable headache 04/07/2018  . Abnormal CT of the abdomen 04/07/2018  . CHB (complete heart block) ([place]) 12/20/2016  . Chronic systolic congestive heart failure ([place]) 12/20/2016  . Angioedema 12/17/2016  . Chronic rhinitis 12/17/2016  . Dyspnea 11/09/2016  . Upper airway cough syndrome 10/29/2016  . Abnormal CT of the chest  c/w lipoid pna 10/29/2016  . Toe ulcer, right ([place]) 10/17/2016  . S/P placement of cardiac pacemaker 09/19/2016  . History of syncope 09/19/2016  . Bradycardic cardiac arrest ([place]) 07/24/2016   Home Medication(s) Prior to Admission medications   Medication Sig Start Date End Date Taking? Authorizing Provider  acetaminophen (TYLENOL) 500 MG tablet Take 500-1,000 mg by mouth every 6 (six) hours as needed for moderate pain or headache.    [provider]  albuterol (PROVENTIL HFA;VENTOLIN HFA) 108 (90 Base) MCG/ACT inhaler Inhale 2 puffs into the lungs every 6 (six) hours as needed for wheezing or shortness of breath. 01/02/18   Saguier, Percell Miller, PA-C  amLODipine (NORVASC) 2.5 MG tablet Take 1 tablet (2.5 mg total) by mouth daily. 05/16/18   Saguier, Percell Miller, PA-C  Ascorbic Acid (VITAMIN C) 1000 MG tablet Take 1,000 mg by mouth daily.    [provider]  atorvastatin (LIPITOR) 10 MG tablet Take 1/2 tablet (5mg ) by mouth daily 03/08/17   Copland, Gay Filler, MD  azelastine (ASTELIN) 0.1 % nasal spray Place 1 spray into both nostrils 2 (two) times daily. Use in each nostril as directed 01/08/18  Saguier, Percell Miller, PA-C  B Complex Vitamins (VITAMIN B COMPLEX) TABS Take by mouth.    [provider]  benzonatate (TESSALON) 100 MG capsule Take 1 capsule (100 mg total) by mouth 3 (three) times daily as needed for cough. 06/13/18   Saguier, Percell Miller, PA-C  Biotin 10000 MCG TABS Take 10,000 mcg by mouth daily.     [provider]  Calcium  Citrate-Vitamin D (CITRACAL + D PO) Take 1 tablet by mouth 2 (two) times daily.    [provider]  ciclopirox (PENLAC) 8 % solution Apply topically at bedtime. Apply over nail and surrounding skin. Apply daily over previous coat. After seven (7) days, may remove with alcohol and continue cycle. Patient taking differently: Apply 1 application topically at bedtime. Apply over nail and surrounding skin. Apply daily over previous coat. After seven (7) days, may remove with alcohol and continue cycle. 12/04/17   Saguier, Percell Miller, PA-C  Coenzyme Q10 (CO Q 10) 100 MG CAPS Take 100 mg by mouth daily.    [provider]  docusate sodium (COLACE) 100 MG capsule Take 100 mg by mouth daily.    [provider]  fluticasone (FLONASE) 50 MCG/ACT nasal spray Place 1 spray into both nostrils daily.    [provider]  hydroxypropyl methylcellulose / hypromellose (ISOPTO TEARS / GONIOVISC) 2.5 % ophthalmic solution Place 1 drop into both eyes 3 (three) times daily as needed for dry eyes.    [provider]  ipratropium (ATROVENT HFA) 17 MCG/ACT inhaler Inhale 2 puffs into the lungs every 6 (six) hours as needed for wheezing. 01/08/18   Saguier, Percell Miller, PA-C  LUTEIN PO Take 1 tablet by mouth daily.    [provider]  Misc Natural Products (GLUCOS-CHONDROIT-MSM COMPLEX) TABS Take 2 tablets by mouth daily.    [provider]  Multiple Vitamins-Minerals (MULTIVITAMIN WITH MINERALS) tablet Take 1 tablet by mouth daily.    [provider]  mupirocin cream (BACTROBAN) 2 % Apply 1 application topically 2 (two) times daily. 04/16/18   Saguier, Percell Miller, PA-C  mupirocin ointment (BACTROBAN) 2 % Apply 1 application topically 2 (two) times daily as needed. 05/26/18   Saguier, Percell Miller, PA-C  Probiotic CAPS Take 1 capsule by mouth daily.    [provider]                                                                                                                                     Past Surgical History Past Surgical History:  Procedure Laterality Date  . ADENOIDECTOMY    . PACEMAKER INSERTION Left 07/26/2016   Abbott Assurity Model #[id number] - Serial #[id number] -- complicated by pneumothorax requiring chest tube placement  . TEE WITHOUT CARDIOVERSION  02/2013   EF 55-60% with normal global function. No thrombus, mass or vegetation. Trivial-small pericardial effusion. Moderate LA dilation. Mild MR.  . TONSILLECTOMY    . TRANSTHORACIC ECHOCARDIOGRAM  07/24/2016  [place]: EF 45-50%. Mid and apical inferior, inferolateral and apical hypokinesis. GR 2 DD. Mild LA and moderate RA dilation. Mild paracardial effusion. Moderate to severe TR. Basal and mid segment hypokinesis with apical hypokinesis. - Suspect stress-induced cardiopathy versus multivessel CAD.  Marland Kitchen TRANSTHORACIC ECHOCARDIOGRAM  07/26/2016   [place]: LIMITED (post PPM) - although the report suggested EF 30-35% with apical akinesis, rounding note suggested this was an improvement   Family History Family History  Problem Relation Age of Onset  . Allergic rhinitis Neg Hx   . Angioedema Neg Hx   . Asthma Neg Hx   . Eczema Neg Hx   . Immunodeficiency Neg Hx   . Urticaria Neg Hx     Social History Social History   Tobacco Use  . Smoking status: Former Smoker    Packs/day: 1.50    Years: 17.00    Pack years: 25.50    Types: Cigarettes    Start date: 12/18/1963    Last attempt to quit: 11/20/1983    Years since quitting: 34.7  . Smokeless tobacco: Never Used  Substance Use Topics  . Alcohol use: No  . Drug use: No   Allergies Dust mite extract; Lidex [fluocinonide]; Molds & smuts; Lactose; Levaquin [levofloxacin in d5w]; Penicillins; and Rifampin  Review of Systems Review of Systems  Unable to perform ROS: Mental status change    Physical Exam Vital Signs  I have reviewed the triage vital signs BP (!) 144/94 (BP Location:  Right Arm)   Pulse 83   Temp 98 F (36.7 C) (Rectal)   Resp (!) 22   Ht 4\' 10"  (1.473 m)   Wt 43.1 kg   SpO2 96%   BMI 19.86 kg/m   Physical Exam  Constitutional: She appears well-developed and well-nourished. No distress.  HENT:  Head: Normocephalic and atraumatic.  Nose: Nose normal.  Eyes: Pupils are equal, round, and reactive to light. Conjunctivae and EOM are normal. Right eye exhibits no discharge. Left eye exhibits no discharge. No scleral icterus.  Neck: Normal range of motion. Neck supple.  Cardiovascular: Normal rate and regular rhythm. Exam reveals no gallop and no friction rub.  No murmur heard. Pulmonary/Chest: Effort normal and breath sounds normal. No stridor. No respiratory distress. She has no rales.    Abdominal: Soft. She exhibits no distension. There is no tenderness.  Musculoskeletal: She exhibits no edema or tenderness.  Neurological: She is alert.  Oriented to person and place.  Not time. Unable to provide coherent answers to questioning. Moves all extremities with 5 out of 5 strength.  Skin: Skin is warm and dry. No rash noted. She is not diaphoretic. No erythema.  Psychiatric: She has a normal mood and affect.  Vitals reviewed.   ED Results and Treatments Labs (all labs ordered are listed, but only abnormal results are displayed) Labs Reviewed  CBC WITH DIFFERENTIAL/PLATELET - Abnormal; Notable for the following components:      Result Value   Hemoglobin 11.7 (*)    HCT 35.4 (*)    All other components within normal limits  COMPREHENSIVE METABOLIC PANEL - Abnormal; Notable for the following components:   Sodium 117 (*)    Chloride 84 (*)    Glucose, Bld 114 (*)    Calcium 8.2 (*)    Total Protein 5.9 (*)    AST 44 (*)    Total Bilirubin 1.7 (*)    All other components within normal limits  BRAIN NATRIURETIC PEPTIDE  SODIUM, URINE, RANDOM  OSMOLALITY, URINE  OSMOLALITY  SODIUM  SODIUM  SODIUM  I-STAT TROPONIN, ED                                                                                                                          EKG  EKG Interpretation  Date/Time:    Ventricular Rate:    PR Interval:    QRS Duration:   QT Interval:    QTC Calculation:   R Axis:     Text Interpretation:        Radiology Dg Chest 2 View  Result Date: 08/13/2018 CLINICAL DATA:  84 year old female with shortness of breath. EXAM: CHEST - 2 VIEW COMPARISON:  None. FINDINGS: There is diffuse chronic interstitial coarsening. Bibasilar streaky and hazy densities, left greater right may represent atelectasis or infiltrate. No large pleural effusion or pneumothorax. Mild enlargement of the cardiac silhouette. Atherosclerotic calcification of the aortic arch. Left pectoral pacemaker device. Osteopenia with degenerative changes of the spine and multiple age indeterminate, possibly old compression fractures and anterior wedging of the thoracic spine. No acute osseous pathology. IMPRESSION: Bibasilar atelectasis versus infiltrate. Electronically Signed   By: Anner Crete M.D.   On: 08/13/2018 04:22   Pertinent labs & imaging results that were available during my care of the patient were reviewed by me and considered in my medical decision making (see chart for details).  Medications Ordered in ED Medications  albuterol (PROVENTIL) (2.5 MG/3ML) 0.083% nebulizer solution 5 mg (has no administration in time range)  piperacillin-tazobactam (ZOSYN) IVPB 3.375 g (3.375 g Intravenous New Bag/Given 08/13/18 0647)  sodium chloride (hypertonic) 3 % solution (has no administration in time range)  acetaminophen (TYLENOL) tablet 1,000 mg (1,000 mg Oral Given 08/13/18 0406)  LORazepam (ATIVAN) injection 0.5 mg (0.5 mg Intravenous Given 08/13/18 0418)  LORazepam (ATIVAN) injection 0.5 mg (0.5 mg Intravenous Given 08/13/18 0630)  ondansetron (ZOFRAN) injection 4 mg (4 mg Intravenous Given 08/13/18 0640)                                                                                                                                     Procedures Procedures CRITICAL CARE Performed by: Grayce Sessions Mozes Sagar Total critical care time: 45 minutes Critical care time was exclusive of separately billable procedures and treating other patients. Critical care was necessary to treat or prevent imminent or life-threatening deterioration. Critical care  was time spent personally by me on the following activities: development of treatment plan with patient and/or surrogate as well as nursing, discussions with consultants, evaluation of patient's response to treatment, examination of patient, obtaining history from patient or surrogate, ordering and performing treatments and interventions, ordering and review of laboratory studies, ordering and review of radiographic studies, pulse oximetry and re-evaluation of patient's condition.   (including critical care time)  Medical Decision Making / ED Course I have reviewed the nursing notes for this encounter and the patient's prior records (if available in EHR or on provided paperwork).    Brought in by EMS for shortness of breath however patient is in no acute respiratory distress at this time.  Lungs clear to auscultation bilaterally.  Chest x-ray with bibasilar opacities.Marland Kitchen  Spoke with son who showed up several hours later. He states patient is normally oriented x4. Reports that she sounded oriented when he was speaking with her in between midnight and 2.  At that time she was complaining of the headache.  States that this is typical for her when she becomes anxious.  Patient is disoriented.  She is afebrile with stable vital signs and no source of infection.  Work-up was consistent with significant hyponatremia at 117 likely causing the patient's symptoms.  Normal saline fluids were initiated. CT head ordered.  Patient began having nonbloody nonbilious emesis.  Likely from the hyponatremia.  Treated with Zofran for  possible aspiration given bibasilar opacities.  She became agitated requiring a mild chemical sedation.  Case discussed with medicine who will admit the patient for continued management.  Final Clinical Impression(s) / ED Diagnoses Final diagnoses:  Disorientation  Hyponatremia syndrome      This chart was dictated using voice recognition software.  Despite best efforts to proofread,  errors can occur which can change the documentation meaning.     Fatima Blank, MD 08/13/18 2117

## 2018-08-13 NOTE — Progress Notes (Signed)
Pt transferred from 53[facility], [place], DENIES ANY PAIN AT THIS TIME, PT SETTLED IN ROOM WITH CALL LIGHT WITHIN PT'S REACH, Tele monitor put and verified on pt, was however reassured and will continue to monitor, v/s stable. [place]-Asidi, Micha Dosanjh Efe

## 2018-08-13 NOTE — Consult Note (Signed)
Referring Provider: No ref. provider found Primary Care Physician:  Mackie Pai, PA-C Primary Nephrologist:     Reason for Consultation:   Hyponatremia  HPI: This is a 84 year old lady that was transferred to the emergency room from [facility] assisted living.  She has a history of bradycardic arrest in 2017 with subsequent placement of permanent pacemaker, cardiomyopathy with an ejection fraction of 50 to 55% February 2018.  She also has a history of COPD and sarcoidosis affecting her lung as well as hyperlipidemia.  She has a history of hyponatremia with a sodium that has been in the 130 range for most of 2019.  She was brought to the emergency room with dyspnea on 08/13/2018 and was found to have some chronic interstitial course findings on chest x-ray with some mild enlargement of the cardiac silhouette.  She had a significant drop in his serum sodium on chemistries to 117.  She had a head CT that was unremarkable for hemorrhage or stroke.  She is using nasal cannula and has 100% O2 sats on 4 L.  She is hemodynamically stable with no evidence of any hypotension.  She is somnolent and difficult to arouse.  Past Medical History:  Diagnosis Date  . Bradycardic cardiac arrest ([place]) 07/24/2016   Required CPR, intubation with multiple rib fractures. Had short term cardiac shock with acute heart failure.  Resulted in possible stress-induced cardiomyopathy  . Cardiac related syncope 07/24/2016   Bradycardic arrest requiring CPR 2 with transvenous pacemaker placed followed by permanent pacemaker; complicated by respiratory arrest, type II MI, stress-induced cardiopathy  . Cardiomyopathy ([place]) 07/2016   Moderate severely reduced EF with apical hypokinesis suggestive of stress-induced cardiac myopathy versus multivessel CAD --> in setting of her to cardiac arrest  . COPD (chronic obstructive pulmonary disease) ([place])    By report  . History of chronic constipation   . Hypercholesterolemia   .  Osteoporosis   . Pacemaker 07/26/2016   Abbott dual-chamber pacemaker: [place] #[id number] - Serial [id number]  . Pneumonia 08/2016  . Sarcoidosis of lung ([place])   . Spinal stenosis     Past Surgical History:  Procedure Laterality Date  . ADENOIDECTOMY    . PACEMAKER INSERTION Left 07/26/2016   Abbott Assurity Model #[id number] - Serial #[id number] -- complicated by pneumothorax requiring chest tube placement  . TEE WITHOUT CARDIOVERSION  02/2013   EF 55-60% with normal global function. No thrombus, mass or vegetation. Trivial-small pericardial effusion. Moderate LA dilation. Mild MR.  . TONSILLECTOMY    . TRANSTHORACIC ECHOCARDIOGRAM  07/24/2016   [facility]: EF 45-50%. Mid and apical inferior, inferolateral and apical hypokinesis. GR 2 DD. Mild LA and moderate RA dilation. Mild paracardial effusion. Moderate to severe TR. Basal and mid segment hypokinesis with apical hypokinesis. - Suspect stress-induced cardiopathy versus multivessel CAD.  Marland Kitchen TRANSTHORACIC ECHOCARDIOGRAM  07/26/2016   [place]: LIMITED (post PPM) - although the report suggested EF 30-35% with apical akinesis, rounding note suggested this was an improvement    Prior to Admission medications   Medication Sig Start Date End Date Taking? Authorizing Provider  acetaminophen (TYLENOL) 500 MG tablet Take 500-1,000 mg by mouth every 6 (six) hours as needed for moderate pain or headache.    [provider]  albuterol (PROVENTIL HFA;VENTOLIN HFA) 108 (90 Base) MCG/ACT inhaler Inhale 2 puffs into the lungs every 6 (six) hours as needed for wheezing or shortness of breath. 01/02/18   Saguier, Percell Miller, PA-C  amLODipine ([place])  2.5 MG tablet Take 1 tablet (2.5 mg total) by mouth daily. 05/16/18   Saguier, Percell Miller, PA-C  Ascorbic Acid (VITAMIN C) 1000 MG tablet Take 1,000 mg by mouth daily.    [provider]  atorvastatin (LIPITOR) 10 MG tablet Take 1/2 tablet (5mg )  by mouth daily 03/08/17   Copland, Gay Filler, MD  azelastine (ASTELIN) 0.1 % nasal spray Place 1 spray into both nostrils 2 (two) times daily. Use in each nostril as directed 01/08/18   Saguier, Percell Miller, PA-C  B Complex Vitamins (VITAMIN B COMPLEX) TABS Take by mouth.    [provider]  benzonatate (TESSALON) 100 MG capsule Take 1 capsule (100 mg total) by mouth 3 (three) times daily as needed for cough. 06/13/18   Saguier, Percell Miller, PA-C  Biotin 10000 MCG TABS Take 10,000 mcg by mouth daily.     [provider]  Calcium Citrate-Vitamin D (CITRACAL + D PO) Take 1 tablet by mouth 2 (two) times daily.    [provider]  ciclopirox (PENLAC) 8 % solution Apply topically at bedtime. Apply over nail and surrounding skin. Apply daily over previous coat. After seven (7) days, may remove with alcohol and continue cycle. Patient taking differently: Apply 1 application topically at bedtime. Apply over nail and surrounding skin. Apply daily over previous coat. After seven (7) days, may remove with alcohol and continue cycle. 12/04/17   Saguier, Percell Miller, PA-C  Coenzyme Q10 (CO Q 10) 100 MG CAPS Take 100 mg by mouth daily.    [provider]  docusate sodium (COLACE) 100 MG capsule Take 100 mg by mouth daily.    [provider]  fluticasone (FLONASE) 50 MCG/ACT nasal spray Place 1 spray into both nostrils daily.    [provider]  hydroxypropyl methylcellulose / hypromellose (ISOPTO TEARS / GONIOVISC) 2.5 % ophthalmic solution Place 1 drop into both eyes 3 (three) times daily as needed for dry eyes.    [provider]  ipratropium (ATROVENT HFA) 17 MCG/ACT inhaler Inhale 2 puffs into the lungs every 6 (six) hours as needed for wheezing. 01/08/18   Saguier, Percell Miller, PA-C  LUTEIN PO Take 1 tablet by mouth daily.    [provider]  Misc Natural Products (GLUCOS-CHONDROIT-MSM COMPLEX) TABS Take 2 tablets by mouth daily.    [provider]   Multiple Vitamins-Minerals (MULTIVITAMIN WITH MINERALS) tablet Take 1 tablet by mouth daily.    [provider]  mupirocin cream (BACTROBAN) 2 % Apply 1 application topically 2 (two) times daily. 04/16/18   Saguier, Percell Miller, PA-C  mupirocin ointment (BACTROBAN) 2 % Apply 1 application topically 2 (two) times daily as needed. 05/26/18   Saguier, Percell Miller, PA-C  Probiotic CAPS Take 1 capsule by mouth daily.    [provider]    Current Facility-Administered Medications  Medication Dose Route Frequency Provider Last Rate Last Dose  . acetaminophen (TYLENOL) tablet 650 mg  650 mg Oral Q6H PRN Dhungel, Nishant, MD       Or  . acetaminophen (TYLENOL) suppository 650 mg  650 mg Rectal Q6H PRN Dhungel, Nishant, MD      . albuterol (PROVENTIL) (2.5 MG/3ML) 0.083% nebulizer solution 5 mg  5 mg Nebulization Once Cardama, Grayce Sessions, MD      . enoxaparin (LOVENOX) injection 30 mg  30 mg Subcutaneous Q24H Dhungel, Nishant, MD      . ondansetron (ZOFRAN) tablet 4 mg  4 mg Oral Q6H PRN Dhungel, Nishant, MD       Or  .  ondansetron (ZOFRAN) injection 4 mg  4 mg Intravenous Q6H PRN Dhungel, Nishant, MD      . sodium chloride (hypertonic) 3 % solution   Intravenous Continuous Dhungel, Nishant, MD        Allergies as of 08/13/2018 - Review Complete 08/13/2018  Allergen Reaction Noted  . Dust mite extract Shortness Of Breath 03/28/2013  . Lidex [fluocinonide] Swelling 12/28/2016  . Molds & smuts Shortness Of Breath 03/28/2013  . Lactose  04/11/2016  . Levaquin [levofloxacin in d5w]  09/11/2016  . Penicillins  09/11/2016  . Rifampin  09/11/2016    Family History  Problem Relation Age of Onset  . Allergic rhinitis Neg Hx   . Angioedema Neg Hx   . Asthma Neg Hx   . Eczema Neg Hx   . Immunodeficiency Neg Hx   . Urticaria Neg Hx     Social History   Socioeconomic History  . Marital status: Widowed    Spouse name: Not on file  . Number of children: Not on file  . Years of  education: Not on file  . Highest education level: Not on file  Occupational History  . Not on file  Social Needs  . Financial resource strain: Not on file  . Food insecurity:    Worry: Not on file    Inability: Not on file  . Transportation needs:    Medical: Not on file    Non-medical: Not on file  Tobacco Use  . Smoking status: Former Smoker    Packs/day: 1.50    Years: 17.00    Pack years: 25.50    Types: Cigarettes    Start date: 12/18/1963    Last attempt to quit: 11/20/1983    Years since quitting: 34.7  . Smokeless tobacco: Never Used  Substance and Sexual Activity  . Alcohol use: No  . Drug use: No  . Sexual activity: Not on file  Lifestyle  . Physical activity:    Days per week: Not on file    Minutes per session: Not on file  . Stress: Not on file  Relationships  . Social connections:    Talks on phone: Not on file    Gets together: Not on file    Attends religious service: Not on file    Active member of club or organization: Not on file    Attends meetings of clubs or organizations: Not on file    Relationship status: Not on file  . Intimate partner violence:    Fear of current or ex partner: Not on file    Emotionally abused: Not on file    Physically abused: Not on file    Forced sexual activity: Not on file  Other Topics Concern  . Not on file  Social History Narrative   Recently moved to [place] from [place] to live near her daughter.    Review of Systems: Not able to obtain directly from patient.  Physical Exam: Vital signs in last 24 hours: Temp:  [97.9 F (36.6 C)-98 F (36.7 C)] 98 F (36.7 C) (09/25 0330) Pulse Rate:  [70-87] 87 (09/25 0730) Resp:  [16-25] 18 (09/25 0647) BP: (132-169)/(87-117) 137/87 (09/25 0730) SpO2:  [91 %-98 %] 95 % (09/25 0730) Weight:  [43.1 kg] 43.1 kg (09/25 0301)   General: Elderly skeletal appearance with sunken cheekbones and pasty looking skin texture Head:  Normocephalic and  atraumatic. Eyes:  Sclera clear, no icterus.   Conjunctiva pink. Ears:  Normal auditory acuity. Nose:  No deformity, discharge,  or lesions.  4 L nasal cannula Mouth:  No deformity or lesions, dentition normal. Neck:  Supple; no masses or thyromegaly. JVP not elevated Lungs:  Clear throughout to auscultation.   No wheezes, crackles, or rhonchi. No acute distress. Heart:  Regular rate and rhythm; no murmurs, clicks, rubs,  or gallops. Abdomen: Soft and distended bowel sounds hypoactive Msk:  Symmetrical without gross deformities. Normal posture. Pulses:  No carotid, renal, femoral bruits. DP and PT symmetrical and equal Extremities:  Without clubbing or edema. Neurologic: Unable to assess.  Patient somnolent difficult to arouse Skin:  Ecchymosis paperthin skin Cervical Nodes:  No significant cervical adenopathy.   Intake/Output from previous day: No intake/output data recorded. Intake/Output this shift: No intake/output data recorded.  Lab Results: Recent Labs    08/13/18 0407  WBC 5.7  HGB 11.7*  HCT 35.4*  PLT 180   BMET Recent Labs    08/13/18 0407 08/13/18 0643  NA 117* 119*  K 3.7  --   CL 84*  --   CO2 22  --   GLUCOSE 114*  --   BUN 10  --   CREATININE 0.50  --   CALCIUM 8.2*  --    LFT Recent Labs    08/13/18 0407  PROT 5.9*  ALBUMIN 3.7  AST 44*  ALT 29  ALKPHOS 55  BILITOT 1.7*   PT/INR No results for input(s): LABPROT, INR in the last 72 hours. Hepatitis Panel No results for input(s): HEPBSAG, HCVAB, HEPAIGM, HEPBIGM in the last 72 hours.  Studies/Results: Dg Chest 2 View  Result Date: 08/13/2018 CLINICAL DATA:  83 year old female with shortness of breath. EXAM: CHEST - 2 VIEW COMPARISON:  None. FINDINGS: There is diffuse chronic interstitial coarsening. Bibasilar streaky and hazy densities, left greater right may represent atelectasis or infiltrate. No large pleural effusion or pneumothorax. Mild enlargement of the cardiac silhouette.  Atherosclerotic calcification of the aortic arch. Left pectoral pacemaker device. Osteopenia with degenerative changes of the spine and multiple age indeterminate, possibly old compression fractures and anterior wedging of the thoracic spine. No acute osseous pathology. IMPRESSION: Bibasilar atelectasis versus infiltrate. Electronically Signed   By: Anner Crete M.D.   On: 08/13/2018 04:22   Ct Head Wo Contrast  Result Date: 08/13/2018 CLINICAL DATA:  Altered mental status EXAM: CT HEAD WITHOUT CONTRAST TECHNIQUE: Contiguous axial images were obtained from the base of the skull through the vertex without intravenous contrast. COMPARISON:  None. FINDINGS: Brain: The ventricles are normal in size and configuration. There is parietal lobe atrophy bilaterally, moderate. There is no intracranial mass, hemorrhage, extra-axial fluid collection, or midline shift. There is mild small vessel disease in the centra semiovale bilaterally. The gray matter and white matter regions otherwise appear unremarkable. No acute infarct evident. Vascular: No hyperdense vessel. There is calcification in the carotid siphon regions bilaterally. Skull: Bony calvarium appears intact. Sinuses/Orbits: There is mucosal thickening in several ethmoid air cells. Other paranasal sinuses are clear. Note that the frontal sinuses are aplastic. Orbits appear symmetric bilaterally. Other: Mastoid air cells are clear. IMPRESSION: Parietal lobe atrophy bilaterally. Ventricles appear normal in size and contour. There is mild periventricular small vessel disease. No acute infarct. No mass or hemorrhage. There are foci of arterial vascular calcification. There is mucosal thickening in several ethmoid air cells. Electronically Signed   By: Lowella Grip III M.D.   On: 08/13/2018 07:38    Assessment/Plan:  Hyponatremia urine sodium 101 measured at 7 2:32 AM.  Serum sodium increased to 119 from 117 on admission at 6:43 AM.  Serum osmolality 247.   Urine osmolality 282.  She appears to be considerably volume depleted to examination and so this may be an appropriate secretion of antidiuretic hormone leading to an increased urine osmolality out of proportion to his serum osmolality.  Urine sodium was measured at 101 which may have been checked after the initiation of sodium chloride.  I would avoid overcorrection or rapid correction of her serum sodium.  And will check a sodium on her later this morning.  I would slow her rate of hypertonic saline to 30 cc/h and will discontinue Protonix saline once serum sodium is greater than 120.    LOS: 0 Sherril Croon @TODAY @9 :32 AM

## 2018-08-13 NOTE — ED Notes (Signed)
Patient transported to X-ray 

## 2018-08-13 NOTE — Progress Notes (Addendum)
Called report to 3W nurse.

## 2018-08-13 NOTE — ED Notes (Addendum)
Pt removed IV and has soiled herself. Gown and linens changed. Pt placed on purewick catheter, and new IV access obtained. A11 sent for mittens.

## 2018-08-13 NOTE — Plan of Care (Signed)

## 2018-08-13 NOTE — Progress Notes (Signed)
Spoke to [employer] and suggested a central line for long term 3% saline. [employer]

## 2018-08-13 NOTE — Progress Notes (Signed)
CRITICAL VALUE ALERT  Critical Value: [place] 244  Date & Time Notied:  08/13/18 1415  Provider Notified:Dhungel  Orders Received/Actions taken: No new orders

## 2018-08-13 NOTE — ED Notes (Signed)
ED Provider at bedside. 

## 2018-08-13 NOTE — Progress Notes (Signed)
Patient admitted to room 25. Assessment completed. Skin showed no issues. Patient oriented x 0. Abnormal Labs Na 119,  Serum Osmol 247, axillary temp 96.7.

## 2018-08-13 NOTE — H&P (Signed)
TRH H&P   Patient Demographics:    Tracey Holt, is a 84 y.o. female  MRN: [id number]   DOB - 01-10-1922  Admit Date - 08/13/2018  Outpatient Primary MD for the patient is Saguier, Iris Pert  Referring MD: ED  Outpatient Specialists: None  Patient coming from: Assisted living  Chief Complaint  Patient presents with  . Shortness of Breath      HPI:    Tracey Holt  is a 84 y.o. female, with history of  chronic systolic CHF (suspected stress-induced cardiomyopathy), symptomatic bradycardia with cardiac arrest in September 2017 status post dual-chamber pacemaker, COPD, hyperlipidemia, osteoporosis, resident of assisted living, mostly independent with her ADLs and will oriented was sent to the ED with shortness of breath, neck and occipital headache.  Patient was found to be confused and somnolent.  In the ED her vitals were stable and on exam was not found to be in respiratory distress.  As per the son present at bedside patient is well oriented and had called him last night stating that she had some throat congestion.  Around 3 in the morning the nursing home staff called her son stating that she was very confused and was sent to the ED. As per son patient is also very particular about her diet and is extremely strict with her sugar intake, salt intake and thinks she is not hydrating herself well. In the ED work-up showed sodium of 117, chloride of 84, (gap of 11), glucose of 114, potassium of 3.7, normal BUN and creatinine.  CBC was normal.    Head CT was negative for acute findings.  Chest x-ray showed bilateral atelectasis versus infiltrate.  Patient had also received a dose of Ativan in the ED. Admitted to stepdown unit.  As per ED note and son at bedside patient was normal until yesterday.  No recent fever, chills, nausea, vomiting, complains of chest pain, abdominal pain,  dysuria, diarrhea, fall or trauma.  No recent travel.  No recent change in her medications. Patient was hospitalized back in May with intractable headache with negative work-up.    Review of systems:    As outlined in HPI, review of system limited due to patient's encephalopathy.   With Past History of the following :    Past Medical History:  Diagnosis Date  . Bradycardic cardiac arrest ([place]) 07/24/2016   Required CPR, intubation with multiple rib fractures. Had short term cardiac shock with acute heart failure.  Resulted in possible stress-induced cardiomyopathy  . Cardiac related syncope 07/24/2016   Bradycardic arrest requiring CPR 2 with transvenous pacemaker placed followed by permanent pacemaker; complicated by respiratory arrest, type II MI, stress-induced cardiopathy  . Cardiomyopathy ([place]) 07/2016   Moderate severely reduced EF with apical hypokinesis suggestive of stress-induced cardiac myopathy versus multivessel CAD --> in setting of her to cardiac arrest  . COPD (chronic obstructive  pulmonary disease) ([place])    By report  . History of chronic constipation   . Hypercholesterolemia   . Osteoporosis   . Pacemaker 07/26/2016   Abbott dual-chamber pacemaker: [employer] #[id number] - Serial [id number]  . Pneumonia 08/2016  . Sarcoidosis of lung ([place])   . Spinal stenosis       Past Surgical History:  Procedure Laterality Date  . ADENOIDECTOMY    . PACEMAKER INSERTION Left 07/26/2016   Abbott Assurity Model #[id number] - Serial #[id number] -- complicated by pneumothorax requiring chest tube placement  . TEE WITHOUT CARDIOVERSION  02/2013   EF 55-60% with normal global function. No thrombus, mass or vegetation. Trivial-small pericardial effusion. Moderate LA dilation. Mild MR.  . TONSILLECTOMY    . TRANSTHORACIC ECHOCARDIOGRAM  07/24/2016   [facility]: EF 45-50%. Mid and apical inferior, inferolateral and apical hypokinesis. GR 2 DD. Mild LA and  moderate RA dilation. Mild paracardial effusion. Moderate to severe TR. Basal and mid segment hypokinesis with apical hypokinesis. - Suspect stress-induced cardiopathy versus multivessel CAD.  Marland Kitchen TRANSTHORACIC ECHOCARDIOGRAM  07/26/2016   [place]: LIMITED (post PPM) - although the report suggested EF 30-35% with apical akinesis, rounding note suggested this was an improvement      Social History:     Social History   Tobacco Use  . Smoking status: Former Smoker    Packs/day: 1.50    Years: 17.00    Pack years: 25.50    Types: Cigarettes    Start date: 12/18/1963    Last attempt to quit: 11/20/1983    Years since quitting: 34.7  . Smokeless tobacco: Never Used  Substance Use Topics  . Alcohol use: No     Lives -at assisted living  Mobility -independent     Family History :     Family History  Problem Relation Age of Onset  . Allergic rhinitis Neg Hx   . Angioedema Neg Hx   . Asthma Neg Hx   . Eczema Neg Hx   . Immunodeficiency Neg Hx   . Urticaria Neg Hx       Home Medications:   Prior to Admission medications   Medication Sig Start Date End Date Taking? Authorizing Provider  acetaminophen (TYLENOL) 500 MG tablet Take 500-1,000 mg by mouth every 6 (six) hours as needed for moderate pain or headache.    [provider]  albuterol (PROVENTIL HFA;VENTOLIN HFA) 108 (90 Base) MCG/ACT inhaler Inhale 2 puffs into the lungs every 6 (six) hours as needed for wheezing or shortness of breath. 01/02/18   Saguier, Percell Miller, PA-C  amLODipine (NORVASC) 2.5 MG tablet Take 1 tablet (2.5 mg total) by mouth daily. 05/16/18   Saguier, Percell Miller, PA-C  Ascorbic Acid (VITAMIN C) 1000 MG tablet Take 1,000 mg by mouth daily.    [provider]  atorvastatin (LIPITOR) 10 MG tablet Take 1/2 tablet (5mg ) by mouth daily 03/08/17   Copland, Gay Filler, MD  azelastine (ASTELIN) 0.1 % nasal spray Place 1 spray into both nostrils 2 (two) times daily. Use in each  nostril as directed 01/08/18   Saguier, Percell Miller, PA-C  B Complex Vitamins (VITAMIN B COMPLEX) TABS Take by mouth.    [provider]  benzonatate (TESSALON) 100 MG capsule Take 1 capsule (100 mg total) by mouth 3 (three) times daily as needed for cough. 06/13/18   Saguier, Percell Miller, PA-C  Biotin 10000 MCG TABS Take 10,000 mcg by mouth daily.     [provider]  Calcium Citrate-Vitamin D (CITRACAL + D PO) Take 1 tablet by mouth 2 (two) times daily.    [provider]  ciclopirox (PENLAC) 8 % solution Apply topically at bedtime. Apply over nail and surrounding skin. Apply daily over previous coat. After seven (7) days, may remove with alcohol and continue cycle. Patient taking differently: Apply 1 application topically at bedtime. Apply over nail and surrounding skin. Apply daily over previous coat. After seven (7) days, may remove with alcohol and continue cycle. 12/04/17   Saguier, Percell Miller, PA-C  Coenzyme Q10 (CO Q 10) 100 MG CAPS Take 100 mg by mouth daily.    [provider]  docusate sodium (COLACE) 100 MG capsule Take 100 mg by mouth daily.    [provider]  fluticasone (FLONASE) 50 MCG/ACT nasal spray Place 1 spray into both nostrils daily.    [provider]  hydroxypropyl methylcellulose / hypromellose (ISOPTO TEARS / GONIOVISC) 2.5 % ophthalmic solution Place 1 drop into both eyes 3 (three) times daily as needed for dry eyes.    [provider]  ipratropium (ATROVENT HFA) 17 MCG/ACT inhaler Inhale 2 puffs into the lungs every 6 (six) hours as needed for wheezing. 01/08/18   Saguier, Percell Miller, PA-C  LUTEIN PO Take 1 tablet by mouth daily.    [provider]  Misc Natural Products (GLUCOS-CHONDROIT-MSM COMPLEX) TABS Take 2 tablets by mouth daily.    [provider]  Multiple Vitamins-Minerals (MULTIVITAMIN WITH MINERALS) tablet Take 1 tablet by mouth daily.    [provider]  mupirocin cream (BACTROBAN) 2 % Apply  1 application topically 2 (two) times daily. 04/16/18   Saguier, Percell Miller, PA-C  mupirocin ointment (BACTROBAN) 2 % Apply 1 application topically 2 (two) times daily as needed. 05/26/18   Saguier, Percell Miller, PA-C  Probiotic CAPS Take 1 capsule by mouth daily.    [provider]     Allergies:     Allergies  Allergen Reactions  . Dust Mite Extract Shortness Of Breath  . Lidex [Fluocinonide] Swelling    Caused lip swelling  . Molds & Smuts Shortness Of Breath  . Lactose   . Levaquin [Levofloxacin In D5w]     Couldn't breath   . Penicillins     Arms swelling   . Rifampin     Couldn't breath      Physical Exam:   Vitals  Blood pressure 137/87, pulse 87, temperature 98 F (36.7 C), temperature source Rectal, resp. rate 18, height 4\' 10"  (1.473 m), weight 43.1 kg, SpO2 95 %.   General elderly female lying in bed somnolent and not arousable. HEENT: Pupils reactive bilaterally, no pallor, no icterus, dry mucosa, supple neck Chest: Scattered rhonchi bilaterally with coarse breath sounds CVS: Normal S1 and S2, no murmurs rubs or gallop GI: Soft, chronic mild distention, nontender, bowel sounds present Musculoskeletal: Warm, no edema, normal skin CNS: Somnolent and not arousable     Data Review:    CBC Recent Labs  Lab 08/13/18 0407  WBC 5.7  HGB 11.7*  HCT 35.4*  PLT 180  MCV 88.7  MCH 29.3  MCHC 33.1  RDW 13.4  LYMPHSABS 0.9  MONOABS 0.5  EOSABS 0.0  BASOSABS 0.0   ------------------------------------------------------------------------------------------------------------------  Chemistries  Recent Labs  Lab 08/13/18 0407 08/13/18 0643  NA 117* 119*  K 3.7  --   CL 84*  --   CO2 22  --   GLUCOSE 114*  --   BUN 10  --  CREATININE 0.50  --   CALCIUM 8.2*  --   AST 44*  --   ALT 29  --   ALKPHOS 55  --   BILITOT 1.7*  --    ------------------------------------------------------------------------------------------------------------------ estimated  creatinine clearance is 27.8 mL/min (by C-G formula based on SCr of 0.5 mg/dL). ------------------------------------------------------------------------------------------------------------------ No results for input(s): TSH, T4TOTAL, T3FREE, THYROIDAB in the last 72 hours.  Invalid input(s): FREET3  Coagulation profile No results for input(s): INR, PROTIME in the last 168 hours. ------------------------------------------------------------------------------------------------------------------- No results for input(s): DDIMER in the last 72 hours. -------------------------------------------------------------------------------------------------------------------  Cardiac Enzymes No results for input(s): CKMB, TROPONINI, MYOGLOBIN in the last 168 hours.  Invalid input(s): CK ------------------------------------------------------------------------------------------------------------------    Component Value Date/Time   BNP 90.7 08/13/2018 0407     ---------------------------------------------------------------------------------------------------------------  Urinalysis    Component Value Date/Time   COLORURINE STRAW (A) 08/13/2018 0732   APPEARANCEUR CLEAR 08/13/2018 0732   LABSPEC 1.005 08/13/2018 0732   PHURINE 8.0 08/13/2018 0732   GLUCOSEU NEGATIVE 08/13/2018 0732   HGBUR SMALL (A) 08/13/2018 0732   BILIRUBINUR NEGATIVE 08/13/2018 0732   BILIRUBINUR negative 10/23/2017 1607   KETONESUR 5 (A) 08/13/2018 0732   PROTEINUR NEGATIVE 08/13/2018 0732   UROBILINOGEN 0.2 10/23/2017 1607   NITRITE NEGATIVE 08/13/2018 0732   LEUKOCYTESUR NEGATIVE 08/13/2018 0732    ----------------------------------------------------------------------------------------------------------------   Imaging Results:    Dg Chest 2 View  Result Date: 08/13/2018 CLINICAL DATA:  84 year old female with shortness of breath. EXAM: CHEST - 2 VIEW COMPARISON:  None. FINDINGS: There is diffuse chronic  interstitial coarsening. Bibasilar streaky and hazy densities, left greater right may represent atelectasis or infiltrate. No large pleural effusion or pneumothorax. Mild enlargement of the cardiac silhouette. Atherosclerotic calcification of the aortic arch. Left pectoral pacemaker device. Osteopenia with degenerative changes of the spine and multiple age indeterminate, possibly old compression fractures and anterior wedging of the thoracic spine. No acute osseous pathology. IMPRESSION: Bibasilar atelectasis versus infiltrate. Electronically Signed   By: Anner Crete M.D.   On: 08/13/2018 04:22   Ct Head Wo Contrast  Result Date: 08/13/2018 CLINICAL DATA:  Altered mental status EXAM: CT HEAD WITHOUT CONTRAST TECHNIQUE: Contiguous axial images were obtained from the base of the skull through the vertex without intravenous contrast. COMPARISON:  None. FINDINGS: Brain: The ventricles are normal in size and configuration. There is parietal lobe atrophy bilaterally, moderate. There is no intracranial mass, hemorrhage, extra-axial fluid collection, or midline shift. There is mild small vessel disease in the centra semiovale bilaterally. The gray matter and white matter regions otherwise appear unremarkable. No acute infarct evident. Vascular: No hyperdense vessel. There is calcification in the carotid siphon regions bilaterally. Skull: Bony calvarium appears intact. Sinuses/Orbits: There is mucosal thickening in several ethmoid air cells. Other paranasal sinuses are clear. Note that the frontal sinuses are aplastic. Orbits appear symmetric bilaterally. Other: Mastoid air cells are clear. IMPRESSION: Parietal lobe atrophy bilaterally. Ventricles appear normal in size and contour. There is mild periventricular small vessel disease. No acute infarct. No mass or hemorrhage. There are foci of arterial vascular calcification. There is mucosal thickening in several ethmoid air cells. Electronically Signed   By: Lowella Grip III M.D.   On: 08/13/2018 07:38    My personal review of EKG: NSR at 78 with IVCD, no ST-T changes noted.   Assessment & Plan:    Active Problems:   Acute symptomatic hyponatremia  New finding.  Sodium level previously have been mostly in the 130s.  Suspect this  is likely hypovolemic /SIADH with patient's poor salt intake.   head CT unremarkable for acute findings.  Noted for low serum and urine osmolality (247 and 282 respectively).  Urine sodium of 101.. Nephrology consult appreciated and agree with hypertonic saline (3%) at a low rate of 30 cc/h.  Monitor BMET q 4hr for now with goal of correction not more than 10-12 meq in 24 hrs. Neuro check per unit protocol.  Seizure precaution. Patient will need to be monitored in ICU for this.  I think this will be temporary and we can discontinue hypertonic swelling once sodium level >=121.  Acute toxic metabolic encephalopathy Contributed by hyponatremia.  Cannot rule out aspiration pneumonia.  Will place on empiric Unasyn.  Blood cultures ordered.  Strict n.p.o.  Neurochecks every 4 hours and seizure precautions.  CBG every 4 hours. Avoid benzos or narcotics.  History of COPD Not in acute exacerbation.  Nebs as tolerated.  Continue 2 L oxygen.    CHB (complete heart block) (HCC) Status post dual-chamber pacemaker.    Chronic systolic congestive heart failure (HCC) Suspected to be stress-induced.  Last echo with EF of 50-55%.  Clinically euvolemic.  Monitor strict I's/O and daily weight.    Aspiration pneumonia of both lower lobes due to gastric secretions ([place]) As outlined above.  Started on empiric Unasyn.  Has been listed as allergy to penicillin with arm swelling.  Patient did receive a dose of IV Zosyn in the ED and seemed to have tolerated.  Will monitor closely.    1.    DVT Prophylaxis sq lovenox  AM Labs Ordered, also please review Full Orders  Family Communication: Admission, patients condition and plan of care  including tests being ordered have been discussed with the patient's son at bedside.   Code Status full code (discussed goals of care in detail with son and he would like full scope of treatment for now)  Likely DC to assisted living once improved  Condition GUARDED  Consults called: Nephrology  Admission status: Inpatient  Time spent in minutes : 73   Tabria Steines M.D on 08/13/2018 at 9:06 AM  Between 7am to 7pm - Pager - [phone]. After 7pm go to [email or web address] - password [facility]  [facility] - Office  [phone]

## 2018-08-13 NOTE — ED Notes (Signed)
Mittens applied to pt's hands.

## 2018-08-13 NOTE — ED Notes (Signed)
Patient transported to CT 

## 2018-08-13 NOTE — Progress Notes (Signed)
Na slowly improving , last was 121. Off hypertonic saline and now on 0.9% NS @ 50cc/hr. Added kcl in fluids ( k 3.3) Required vest retrains and haldol for agitation. Keep NPO. Monitor Na q4h with neurochecks. Transfer out to telemetry.

## 2018-08-13 NOTE — ED Triage Notes (Signed)
Pt BIB EMS from [facility] assisted living center. Pt told staff that she wasn't able to sleep at all last night because she was SOB. Denies CP, N/V/D. Per EMS, no wheezing or rhonci noted. SpO2 93% on RA, so placed on 2Lpm via [place], which incr'd sats to 98%. Per EMS, pt initially insisting on transport to doctor's office or [facility]. Brought here d/t COPD, CHF.

## 2018-08-14 DIAGNOSIS — E871 Hypo-osmolality and hyponatremia: Principal | ICD-10-CM

## 2018-08-14 DIAGNOSIS — G9341 Metabolic encephalopathy: Secondary | ICD-10-CM

## 2018-08-14 LAB — BASIC METABOLIC PANEL
ANION GAP: 4 — AB (ref 5–15)
Anion gap: 6 (ref 5–15)
Anion gap: 8 (ref 5–15)
BUN: <5 mg/dL — ABNORMAL LOW (ref 8–23)
BUN: 5 mg/dL — ABNORMAL LOW (ref 8–23)
CALCIUM: 8.1 mg/dL — AB (ref 8.9–10.3)
CALCIUM: 8.4 mg/dL — AB (ref 8.9–10.3)
CO2: 27 mmol/L (ref 22–32)
CO2: 28 mmol/L (ref 22–32)
CO2: 24 mmol/L (ref 22–32)
CREATININE: 0.61 mg/dL (ref 0.44–1.00)
CREATININE: 0.5 mg/dL (ref 0.44–1.00)
Calcium: 8.6 mg/dL — ABNORMAL LOW (ref 8.9–10.3)
Chloride: 99 mmol/L (ref 98–111)
Chloride: 99 mmol/L (ref 98–111)
Chloride: 99 mmol/L (ref 98–111)
Creatinine, Ser: 0.51 mg/dL (ref 0.44–1.00)
GFR calc Af Amer: >60 mL/min (ref >60)
GFR calc Af Amer: >60 mL/min (ref >60)
GFR calc non Af Amer: >60 mL/min (ref >60)
GLUCOSE: 85 mg/dL (ref 70–99)
Glucose, Bld: 84 mg/dL (ref 70–99)
Glucose, Bld: 96 mg/dL (ref 70–99)
POTASSIUM: 3.7 mmol/L (ref 3.5–5.1)
POTASSIUM: 4 mmol/L (ref 3.5–5.1)
Potassium: 3.5 mmol/L (ref 3.5–5.1)
Sodium: 130 mmol/L — ABNORMAL LOW (ref 135–145)
Sodium: 133 mmol/L — ABNORMAL LOW (ref 135–145)
Sodium: 131 mmol/L — ABNORMAL LOW (ref 135–145)

## 2018-08-14 LAB — CBC
HCT: 35.3 % — ABNORMAL LOW (ref 36.0–46.0)
Hemoglobin: 11.7 g/dL — ABNORMAL LOW (ref 12.0–15.0)
MCH: 29.3 pg (ref 26.0–34.0)
MCHC: 33.1 g/dL (ref 30.0–36.0)
MCV: 88.3 fL (ref 78.0–100.0)
Platelets: 166 10*3/uL (ref 150–400)
RBC: 4 MIL/uL (ref 3.87–5.11)
RDW: 13.4 % (ref 11.5–15.5)
WBC: 5.1 10*3/uL (ref 4.0–10.5)

## 2018-08-14 LAB — GLUCOSE, CAPILLARY: Glucose-Capillary: 93 mg/dL (ref 70–99)

## 2018-08-14 LAB — PROCALCITONIN

## 2018-08-14 MED ORDER — ACETAMINOPHEN 325 MG PO TABS
325.0000 mg | ORAL_TABLET | Freq: Once | ORAL | Status: DC
  Filled 2018-08-14: qty 1

## 2018-08-14 MED ORDER — POTASSIUM CHLORIDE IN NACL 20-0.45 MEQ/L-% IV SOLN
INTRAVENOUS | Status: DC
  Administered 2018-08-14: 08:00:00 via INTRAVENOUS
  Filled 2018-08-14: qty 1000

## 2018-08-14 MED ORDER — IBUPROFEN 200 MG PO TABS
400.0000 mg | ORAL_TABLET | Freq: Once | ORAL | Status: AC
  Administered 2018-08-14: 400 mg via ORAL
  Filled 2018-08-14: qty 2

## 2018-08-14 MED ORDER — GUAIFENESIN 100 MG/5ML PO SOLN
5.0000 mL | ORAL | Status: DC | PRN
  Administered 2018-08-14 – 2018-08-15 (×2): 100 mg via ORAL
  Filled 2018-08-14 (×2): qty 5

## 2018-08-14 MED ORDER — BUTALBITAL-APAP-CAFFEINE 50-325-40 MG PO TABS: 1.0000 | ORAL_TABLET | Freq: Once | ORAL | Status: DC

## 2018-08-14 MED ORDER — ACETAMINOPHEN 325 MG PO TABS
650.0000 mg | ORAL_TABLET | Freq: Once | ORAL | Status: AC
  Administered 2018-08-14: 650 mg via ORAL

## 2018-08-14 MED ORDER — BUTALBITAL-APAP-CAFFEINE 50-325-40 MG PO TABS
1.0000 | ORAL_TABLET | Freq: Once | ORAL | Status: AC
  Administered 2018-08-14: 1 via ORAL
  Filled 2018-08-14: qty 1

## 2018-08-14 NOTE — Evaluation (Signed)
Physical Therapy Evaluation Patient Details Name: Tracey Holt MRN: [id number] DOB: August 19, 1922 Today's Date: 08/14/2018   History of Present Illness  Patient is a 84 y/o female presenting to the ED on 08/13/18 with primary complaints of SOB, neck and occipital headache. Head CT was negative for acute findings.  Chest x-ray showed bilateral atelectasis versus infiltrate. PMH significant for chronic systolic CHF (suspected stress-induced cardiomyopathy), symptomatic bradycardia with cardiac arrest in September 2017 status post dual-chamber pacemaker, COPD, hyperlipidemia, osteoporosis, resident of assisted living.    Clinical Impression  Ms. Lorman is a very pleasant 84 y/o female admitted with the above listed diagnosis. Prior to admission patient was a resident of ILF, where she was Mod I with mobility. Patient today requiring general Min guard assist for safety - does not require physical assist for steadying. PT to follow acutely to ensure safe and independent functional mobility, however do not expect patient to need skilled PT services at discharge.     Follow Up Recommendations No PT follow up    Equipment Recommendations  None recommended by PT    Recommendations for Other Services       Precautions / Restrictions Precautions Precautions: Fall Restrictions Weight Bearing Restrictions: No      Mobility  Bed Mobility Overal bed mobility: Needs Assistance Bed Mobility: Supine to Sit;Sit to Supine     Supine to sit: Supervision Sit to supine: Supervision   General bed mobility comments: for safety  Transfers Overall transfer level: Needs assistance Equipment used: None Transfers: Sit to/from Stand Sit to Stand: Min guard         General transfer comment: for safety and immediate standing balance  Ambulation/Gait Ambulation/Gait assistance: Min guard Gait Distance (Feet): 200 Feet Assistive device: None Gait Pattern/deviations: Step-through pattern;Decreased stride  length;Trunk flexed;Narrow base of support Gait velocity: decreased   General Gait Details: good pace; no LOB; good obstacle navigation  Stairs            Wheelchair Mobility    Modified Rankin (Stroke Patients Only)       Balance Overall balance assessment: Mild deficits observed, not formally tested                                           Pertinent Vitals/Pain Pain Assessment: No/denies pain    Home Living Family/patient expects to be discharged to:: Assisted living               Home Equipment: None      Prior Function Level of Independence: Independent         Comments: reports active in her ILF     Hand Dominance        Extremity/Trunk Assessment        Lower Extremity Assessment Lower Extremity Assessment: Overall WFL for tasks assessed    Cervical / Trunk Assessment Cervical / Trunk Assessment: Kyphotic  Communication   Communication: No difficulties  Cognition Arousal/Alertness: Awake/alert Behavior During Therapy: WFL for tasks assessed/performed Overall Cognitive Status: No family/caregiver present to determine baseline cognitive functioning                                        General Comments      Exercises     Assessment/Plan    PT Assessment Patient needs  continued PT services  PT Problem List Decreased strength;Decreased activity tolerance;Decreased balance;Decreased mobility;Decreased safety awareness       PT Treatment Interventions Gait training;Functional mobility training;Therapeutic activities;Therapeutic exercise;Balance training;Neuromuscular re-education;Patient/family education    PT Goals (Current goals can be found in the Care Plan section)  Acute Rehab PT Goals Patient Stated Goal: none stated PT Goal Formulation: With patient Time For Goal Achievement: 08/21/18 Potential to Achieve Goals: Good    Frequency Min 2X/week   Barriers to discharge         Co-evaluation               AM-PAC PT "6 Clicks" Daily Activity  Outcome Measure Difficulty turning over in bed (including adjusting bedclothes, sheets and blankets)?: A Little Difficulty moving from lying on back to sitting on the side of the bed? : A Little Difficulty sitting down on and standing up from a chair with arms (e.g., wheelchair, bedside commode, etc,.)?: Unable Help needed moving to and from a bed to chair (including a wheelchair)?: A Little Help needed walking in hospital room?: A Little Help needed climbing 3-5 steps with a railing? : A Little 6 Click Score: 16    End of Session Equipment Utilized During Treatment: Gait belt Activity Tolerance: Patient tolerated treatment well Patient left: in bed;with call bell/phone within reach;with nursing/sitter in room Nurse Communication: Mobility status PT Visit Diagnosis: Unsteadiness on feet (R26.81);Other abnormalities of gait and mobility (R26.89);Muscle weakness (generalized) (M62.81)    Time: 7096-4383 PT Time Calculation (min) (ACUTE ONLY): 18 min   Charges:   PT Evaluation $PT Eval Moderate Complexity: 1 Mod        Lanney Gins, PT, DPT Supplemental Physical Therapist 08/14/18 3:25 PM Pager: [phone] Office: [phone]

## 2018-08-14 NOTE — Progress Notes (Signed)
[facility] ROUNDING NOTE   Subjective:   She appears better this morning she is sitting up in bed she wishes to go back to the nursing home she is talking on the telephone.  She has no complaints labs this morning showed sodium 133 potassium 3.7 chloride 99 creatinine 0.5  She never started hypertonic saline.  She was given normal saline rate of rise of sodium was a little rapid although he has had no neurological deficits.  She is awake alert.  Objective:  Vital signs in last 24 hours:  Temp:  [97.4 F (36.3 C)-98.3 F (36.8 C)] 98.2 F (36.8 C) (09/26 0752) Pulse Rate:  [69-84] 84 (09/26 0752) Resp:  [11-23] 18 (09/26 0752) BP: (82-130)/(52-87) 106/63 (09/26 0752) SpO2:  [95 %-100 %] 97 % (09/26 0752) Weight:  [44.8 kg] 44.8 kg (09/26 0435)  Weight change: 1.724 kg Filed Weights   08/13/18 0301 08/14/18 0435  Weight: 43.1 kg 44.8 kg    Intake/Output: I/O last 3 completed shifts: In: 660.8 [I.V.:207; IV Piggyback:453.7] Out: 4275 [Urine:4275]   Intake/Output this shift:  No intake/output data recorded.  CVS- RRR RS- CTA ABD- BS present soft non-distended EXT- no edema Neurology awake alert with no focal deficits on examination   Basic Metabolic Panel: Recent Labs  Lab 08/13/18 1459 08/13/18 1833 08/13/18 2222 08/14/18 0232 08/14/18 0544  NA 121* 127* 128* 130* 133*  K 3.3* 4.0 3.7 3.5 3.7  CL 88* 94* 96* 99 99  CO2 26 27 26 27 28   GLUCOSE 126* 103* 97 84 85  BUN 6* 5* 5* <5* <5*  CREATININE 0.51 0.50 0.52 0.61 0.50  CALCIUM 7.6* 8.3* 8.1* 8.1* 8.6*    Liver Function Tests: Recent Labs  Lab 08/13/18 0407  AST 44*  ALT 29  ALKPHOS 55  BILITOT 1.7*  PROT 5.9*  ALBUMIN 3.7   No results for input(s): LIPASE, AMYLASE in the last 168 hours. No results for input(s): AMMONIA in the last 168 hours.  CBC: Recent Labs  Lab 08/13/18 0407 08/13/18 0948 08/14/18 0544  WBC 5.7 6.4 5.1  NEUTROABS 4.2  --   --   HGB 11.7* 11.4* 11.7*   HCT 35.4* 34.1* 35.3*  MCV 88.7 88.3 88.3  PLT 180 145* 166    Cardiac Enzymes: No results for input(s): CKTOTAL, CKMB, CKMBINDEX, TROPONINI in the last 168 hours.  BNP: Invalid input(s): POCBNP  CBG: Recent Labs  Lab 08/13/18 1153 08/13/18 1516 08/13/18 1931 08/14/18 0646  GLUCAP 121* 96 92 93    Microbiology: Results for orders placed or performed during the hospital encounter of 08/13/18  Culture, blood (routine x 2)     Status: None (Preliminary result)   Collection Time: 08/13/18 12:22 PM  Result Value Ref Range Status   Specimen Description BLOOD BLOOD RIGHT HAND  Final   Special Requests   Final    BOTTLES DRAWN AEROBIC ONLY Blood Culture results may not be optimal due to an inadequate volume of blood received in culture bottles   Culture   Final    NO GROWTH < 24 HOURS Performed at [facility], [place] [address], [place],  [address]    Report Status PENDING  Incomplete  Culture, blood (routine x 2)     Status: None (Preliminary result)   Collection Time: 08/13/18 12:22 PM  Result Value Ref Range Status   Specimen Description BLOOD RIGHT ANTECUBITAL  Final   Special Requests   Final    BOTTLES DRAWN  AEROBIC ONLY Blood Culture adequate volume   Culture   Final    NO GROWTH < 24 HOURS Performed at [facility], [place] [address], [place], [place] [address]    Report Status PENDING  Incomplete  MRSA PCR Screening     Status: None   Collection Time: 08/13/18  8:09 PM  Result Value Ref Range Status   MRSA by PCR NEGATIVE NEGATIVE Final    Comment:        The GeneXpert MRSA Assay (FDA approved for NASAL specimens only), is one component of a comprehensive MRSA colonization surveillance program. It is not intended to diagnose MRSA infection nor to guide or monitor treatment for MRSA infections. Performed at [facility], [place] [address] [place], [place] [address]     Coagulation Studies: No results for input(s): LABPROT, INR in the  last 72 hours.  Urinalysis: Recent Labs    08/13/18 0732  COLORURINE STRAW*  LABSPEC 1.005  PHURINE 8.0  GLUCOSEU NEGATIVE  HGBUR SMALL*  BILIRUBINUR NEGATIVE  KETONESUR 5*  PROTEINUR NEGATIVE  NITRITE NEGATIVE  LEUKOCYTESUR NEGATIVE      Imaging: Dg Chest 2 View  Result Date: 08/13/2018 CLINICAL DATA:  84 year old female with shortness of breath. EXAM: CHEST - 2 VIEW COMPARISON:  None. FINDINGS: There is diffuse chronic interstitial coarsening. Bibasilar streaky and hazy densities, left greater right may represent atelectasis or infiltrate. No large pleural effusion or pneumothorax. Mild enlargement of the cardiac silhouette. Atherosclerotic calcification of the aortic arch. Left pectoral pacemaker device. Osteopenia with degenerative changes of the spine and multiple age indeterminate, possibly old compression fractures and anterior wedging of the thoracic spine. No acute osseous pathology. IMPRESSION: Bibasilar atelectasis versus infiltrate. Electronically Signed   By: Anner Crete M.D.   On: 08/13/2018 04:22   Ct Head Wo Contrast  Result Date: 08/13/2018 CLINICAL DATA:  Altered mental status EXAM: CT HEAD WITHOUT CONTRAST TECHNIQUE: Contiguous axial images were obtained from the base of the skull through the vertex without intravenous contrast. COMPARISON:  None. FINDINGS: Brain: The ventricles are normal in size and configuration. There is parietal lobe atrophy bilaterally, moderate. There is no intracranial mass, hemorrhage, extra-axial fluid collection, or midline shift. There is mild small vessel disease in the centra semiovale bilaterally. The gray matter and white matter regions otherwise appear unremarkable. No acute infarct evident. Vascular: No hyperdense vessel. There is calcification in the carotid siphon regions bilaterally. Skull: Bony calvarium appears intact. Sinuses/Orbits: There is mucosal thickening in several ethmoid air cells. Other paranasal sinuses are clear.  Note that the frontal sinuses are aplastic. Orbits appear symmetric bilaterally. Other: Mastoid air cells are clear. IMPRESSION: Parietal lobe atrophy bilaterally. Ventricles appear normal in size and contour. There is mild periventricular small vessel disease. No acute infarct. No mass or hemorrhage. There are foci of arterial vascular calcification. There is mucosal thickening in several ethmoid air cells. Electronically Signed   By: Lowella Grip III M.D.   On: 08/13/2018 07:38     Medications:   . 0.45 % NaCl with KCl 20 mEq / L 75 mL/hr at 08/14/18 0748  . ampicillin-sulbactam (UNASYN) IV 3 g (08/14/18 1029)   . albuterol  5 mg Nebulization Once  . enoxaparin (LOVENOX) injection  30 mg Subcutaneous Q24H   acetaminophen **OR** acetaminophen, guaiFENesin, haloperidol lactate, ondansetron **OR** ondansetron (ZOFRAN) IV  Assessment/ Plan:   Hyponatremia probably secondary to poor solute intake-tea and toast syndrome most likely to be etiology.  She appears much  better this morning with saline and improve nutrition.  I do not feel compelled to give free water in order to lower sodium as this seems to be rising in a natural way.  But this may have been an acute hyponatremic episode.  Either way she appears to be doing much better and doubt that she will suffer neurological deficits.  Discussed the case with Dr. Florene Glen and we are in agreement to discontinue any IV fluids and continue to monitor her nutritional intake.  There appears to be no role for nephrology at this point and will sign off but will be available to consult if help and support needed    LOS: [place] @TODAY @11 :04 AM

## 2018-08-14 NOTE — Progress Notes (Signed)
PROGRESS NOTE    Tracey Holt  QQP:[id number] DOB: [birth year] DOA: 08/13/2018 PCP: Mackie Pai, PA-C   Brief Narrative:  Tracey Holt  is a 84 y.o. female, with history of  chronic systolic CHF (suspected stress-induced cardiomyopathy), symptomatic bradycardia with cardiac arrest in September 2017 status post dual-chamber pacemaker, COPD, hyperlipidemia, osteoporosis, resident of assisted living, mostly independent with her ADLs and will oriented was sent to the ED with shortness of breath, neck and occipital headache.  Patient was found to be confused and somnolent.  In the ED her vitals were stable and on exam was not found to be in respiratory distress.  As per the son present at bedside patient is well oriented and had called him last night stating that she had some throat congestion.  Around 3 in the morning the nursing home staff called her son stating that she was very confused and was sent to the ED. As per son patient is also very particular about her diet and is extremely strict with her sugar intake, salt intake and thinks she is not hydrating herself well. In the ED work-up showed sodium of 117, chloride of 84, (gap of 11), glucose of 114, potassium of 3.7, normal BUN and creatinine.  CBC was normal.    Head CT was negative for acute findings.  Chest x-ray showed bilateral atelectasis versus infiltrate.  Patient had also received a dose of Ativan in the ED. Admitted to stepdown unit.  As per ED note and son at bedside patient was normal until yesterday.  No recent fever, chills, nausea, vomiting, complains of chest pain, abdominal pain, dysuria, diarrhea, fall or trauma.  No recent travel.  No recent change in her medications. Patient was hospitalized back in May with intractable headache with negative work-up.  Assessment & Plan:   Active Problems:   S/P placement of cardiac pacemaker   Upper airway cough syndrome   CHB (complete heart block) (HCC)   Chronic systolic congestive  heart failure (HCC)   Hyponatremia syndrome   Acute metabolic encephalopathy   Aspiration pneumonia of both lower lobes due to gastric secretions (HCC)   Acute Symptomatic Hyponatremia: Appreciate nephrology assistance, per our discussion, pt actually never received any hypertonic saline Per nephrology poor solute intake, tea/toast most likely etiology Initial urine osm 282, repeat 360 (initial urine Na 101, repeat 67) Na corrected faster than ideal, but pt is doing well.  Will continue to monitor without any IVF at this point in time.  Discussed with nephrology.  Acute toxic metabolic encephalopathy: Suspect 2/2 hyponatremia, seems back to baseline this morning Head CT without acute changes Required haldol and vest restraints for agitation  Concern for aspiration pneumonia CXR with bibasilar atelectasis vs infiltrate Pt appears well, PCT negative Will d/c abx and monitor Follow cx results  Hx of COPD No si/sx exacerbation, wean O2 as tolerated  Complete heart block S/p pacemaker placement  Hx HFrEF Appears euvolemic, most recent EF 50-55% 12/2016  Headache: mild, follow with APAP  DVT prophylaxis: lovenox Code Status: full Family Communication: son at bedside Disposition Plan: hopefully tomorrow with improvement  Consultants:   nephrology  Procedures: none  none  Antimicrobials: Anti-infectives (From admission, onward)   Start     Dose/Rate Route Frequency Ordered Stop   08/13/18 1100  Ampicillin-Sulbactam (UNASYN) 3 g in sodium chloride 0.9 % 100 mL IVPB  Status:  Discontinued     3 g 200 mL/hr over 30 Minutes Intravenous Every 12 hours 08/13/18 1002 08/14/18 1107  08/13/18 0645  piperacillin-tazobactam (ZOSYN) IVPB 3.375 g     3.375 g 100 mL/hr over 30 Minutes Intravenous  Once 08/13/18 0636 08/13/18 0757         Subjective: A&Ox2. C/o mild HA.   Objective: Vitals:   08/14/18 0041 08/14/18 0422 08/14/18 0435 08/14/18 0752  BP: (!) 83/53 105/64  99/60 106/63  Pulse: 69 70 70 84  Resp: 18  18 18   Temp: 98.2 F (36.8 C)  98.3 F (36.8 C) 98.2 F (36.8 C)  TempSrc: Axillary  Axillary Oral  SpO2: 97%  99% 97%  Weight:   44.8 kg   Height:        Intake/Output Summary (Last 24 hours) at 08/14/2018 1107 Last data filed at 08/14/2018 0440 Gross per 24 hour  Intake 660.75 ml  Output 4275 ml  Net -3614.25 ml   Filed Weights   08/13/18 0301 08/14/18 0435  Weight: 43.1 kg 44.8 kg    Examination:  General exam: Appears calm and comfortable  Respiratory system: Clear to auscultation. Respiratory effort normal. Cardiovascular system: S1 & S2 heard, RRR. Gastrointestinal system: Abdomen is nondistended, soft and nontender Central nervous system: Alert and oriented x2 (didn't know month). No focal neurological deficits. Extremities: Symmetric 5 x 5 power. Skin: No rashes, lesions or ulcers Psychiatry: Judgement and insight appear normal. Mood & affect appropriate.     Data Reviewed: I have personally reviewed following labs and imaging studies  CBC: Recent Labs  Lab 08/13/18 0407 08/13/18 0948 08/14/18 0544  WBC 5.7 6.4 5.1  NEUTROABS 4.2  --   --   HGB 11.7* 11.4* 11.7*  HCT 35.4* 34.1* 35.3*  MCV 88.7 88.3 88.3  PLT 180 145* 465   Basic Metabolic Panel: Recent Labs  Lab 08/13/18 1459 08/13/18 1833 08/13/18 2222 08/14/18 0232 08/14/18 0544  NA 121* 127* 128* 130* 133*  K 3.3* 4.0 3.7 3.5 3.7  CL 88* 94* 96* 99 99  CO2 26 27 26 27 28   GLUCOSE 126* 103* 97 84 85  BUN 6* 5* 5* <5* <5*  CREATININE 0.51 0.50 0.52 0.61 0.50  CALCIUM 7.6* 8.3* 8.1* 8.1* 8.6*   GFR: Estimated Creatinine Clearance: 27.8 mL/min (by C-G formula based on SCr of 0.5 mg/dL). Liver Function Tests: Recent Labs  Lab 08/13/18 0407  AST 44*  ALT 29  ALKPHOS 55  BILITOT 1.7*  PROT 5.9*  ALBUMIN 3.7   No results for input(s): LIPASE, AMYLASE in the last 168 hours. No results for input(s): AMMONIA in the last 168  hours. Coagulation Profile: No results for input(s): INR, PROTIME in the last 168 hours. Cardiac Enzymes: No results for input(s): CKTOTAL, CKMB, CKMBINDEX, TROPONINI in the last 168 hours. BNP (last 3 results) Recent Labs    01/02/18 1443  PROBNP 76.0   HbA1C: No results for input(s): HGBA1C in the last 72 hours. CBG: Recent Labs  Lab 08/13/18 1153 08/13/18 1516 08/13/18 1931 08/14/18 0646  GLUCAP 121* 96 92 93   Lipid Profile: No results for input(s): CHOL, HDL, LDLCALC, TRIG, CHOLHDL, LDLDIRECT in the last 72 hours. Thyroid Function Tests: Recent Labs    08/13/18 0948  TSH 3.360   Anemia Panel: Recent Labs    08/13/18 0948  VITAMINB12 1,067*   Sepsis Labs: No results for input(s): PROCALCITON, LATICACIDVEN in the last 168 hours.  Recent Results (from the past 240 hour(s))  Culture, blood (routine x 2)     Status: None (Preliminary result)   Collection Time: 08/13/18 12:22  PM  Result Value Ref Range Status   Specimen Description BLOOD BLOOD RIGHT HAND  Final   Special Requests   Final    BOTTLES DRAWN AEROBIC ONLY Blood Culture results may not be optimal due to an inadequate volume of blood received in culture bottles   Culture   Final    NO GROWTH < 24 HOURS Performed at [facility] [address] [place], [place] [address]    Report Status PENDING  Incomplete  Culture, blood (routine x 2)     Status: None (Preliminary result)   Collection Time: 08/13/18 12:22 PM  Result Value Ref Range Status   Specimen Description BLOOD RIGHT ANTECUBITAL  Final   Special Requests   Final    BOTTLES DRAWN AEROBIC ONLY Blood Culture adequate volume   Culture   Final    NO GROWTH < 24 HOURS Performed at [facility], [place] [address], [place], [place] [address]    Report Status PENDING  Incomplete  MRSA PCR Screening     Status: None   Collection Time: 08/13/18  8:09 PM  Result Value Ref Range Status   MRSA by PCR NEGATIVE NEGATIVE Final    Comment:         The GeneXpert MRSA Assay (FDA approved for NASAL specimens only), is one component of a comprehensive MRSA colonization surveillance program. It is not intended to diagnose MRSA infection nor to guide or monitor treatment for MRSA infections. Performed at [facility], [place] [address], [place], [place] [address]          Radiology Studies: Dg Chest 2 View  Result Date: 08/13/2018 CLINICAL DATA:  84 year old female with shortness of breath. EXAM: CHEST - 2 VIEW COMPARISON:  None. FINDINGS: There is diffuse chronic interstitial coarsening. Bibasilar streaky and hazy densities, left greater right may represent atelectasis or infiltrate. No large pleural effusion or pneumothorax. Mild enlargement of the cardiac silhouette. Atherosclerotic calcification of the aortic arch. Left pectoral pacemaker device. Osteopenia with degenerative changes of the spine and multiple age indeterminate, possibly old compression fractures and anterior wedging of the thoracic spine. No acute osseous pathology. IMPRESSION: Bibasilar atelectasis versus infiltrate. Electronically Signed   By: Anner Crete M.D.   On: 08/13/2018 04:22   Ct Head Wo Contrast  Result Date: 08/13/2018 CLINICAL DATA:  Altered mental status EXAM: CT HEAD WITHOUT CONTRAST TECHNIQUE: Contiguous axial images were obtained from the base of the skull through the vertex without intravenous contrast. COMPARISON:  None. FINDINGS: Brain: The ventricles are normal in size and configuration. There is parietal lobe atrophy bilaterally, moderate. There is no intracranial mass, hemorrhage, extra-axial fluid collection, or midline shift. There is mild small vessel disease in the centra semiovale bilaterally. The gray matter and white matter regions otherwise appear unremarkable. No acute infarct evident. Vascular: No hyperdense vessel. There is calcification in the carotid siphon regions bilaterally. Skull: Bony calvarium appears intact.  Sinuses/Orbits: There is mucosal thickening in several ethmoid air cells. Other paranasal sinuses are clear. Note that the frontal sinuses are aplastic. Orbits appear symmetric bilaterally. Other: Mastoid air cells are clear. IMPRESSION: Parietal lobe atrophy bilaterally. Ventricles appear normal in size and contour. There is mild periventricular small vessel disease. No acute infarct. No mass or hemorrhage. There are foci of arterial vascular calcification. There is mucosal thickening in several ethmoid air cells. Electronically Signed   By: Lowella Grip III M.D.   On: 08/13/2018 07:38  Scheduled Meds: . albuterol  5 mg Nebulization Once  . enoxaparin (LOVENOX) injection  30 mg Subcutaneous Q24H   Continuous Infusions: . 0.45 % NaCl with KCl 20 mEq / L 75 mL/hr at 08/14/18 0748     LOS: 1 day    Time spent: over 30 min    Fayrene Helper, MD [facility] Pager [phone]  If 7PM-7AM, please contact night-coverage [email or web address] Password [facility] 08/14/2018, 11:07 AM

## 2018-08-14 NOTE — Clinical Social Work Note (Signed)
Clinical Social Work Assessment  Patient Details  Name: Tracey Holt MRN: [id number] Date of Birth: December 29, 1921  Date of referral:  08/14/18               Reason for consult:  Facility Placement                Permission sought to share information with:  Family Supports Permission granted to share information::  Yes, Verbal Permission Granted  Name::     Surveyor, quantity::     Relationship::  Son  Sport and exercise psychologist Information:     Housing/Transportation Living arrangements for the past 2 months:  [place] of Information:  Patient, Medical Team, Adult Children Patient Interpreter Needed:  None Criminal Activity/Legal Involvement Pertinent to Current Situation/Hospitalization:  No - Comment as needed Significant Relationships:  Adult Children Lives with:  Self Do you feel safe going back to the place where you live?  Yes Need for family participation in patient care:  Yes (Comment)  Care giving concerns:  Patient from [place], and son reports no concerns at this time.   Social Worker assessment / plan:  CSW met with patient and patient's son at bedside after report that patient arrived from [place]. CSW introduced self and explained role of assisting with discharge process. Patient's son indicated that there wouldn't be any nurse involvement at [facility] because patient is in independent living, so the information would go through him, as he is the [place]. CSW indicated that she would alert the team.  Employment status:  Retired Nurse, adult PT Recommendations:  Not assessed at this time Information / Referral to community resources:     Patient/Family's Response to care:  Patient and patient's son happy with care at [facility] and wondering when she can return.  Patient/Family's Understanding of and Emotional Response to Diagnosis, Current Treatment, and Prognosis:  Patient was able to state  that she was from [facility] and that she had been there about a year, introduced CSW to her son at bedside. Patient's son was requesting to speak to the nurse to figure out what was going on with the patient being at the hospital and when the patient could return back to her home.   Emotional Assessment Appearance:  Appears stated age Attitude/Demeanor/Rapport:  Engaged Affect (typically observed):  Pleasant Orientation:  Oriented to Self, Oriented to Place Alcohol / Substance use:  Not Applicable Psych involvement (Current and /or in the community):  No (Comment)  Discharge Needs  Concerns to be addressed:  Care Coordination Readmission within the last 30 days:  No Current discharge risk:    Barriers to Discharge:  Continued Medical Work up   [employer], [place] 08/14/2018, 11:08 AM

## 2018-08-15 DIAGNOSIS — R410 Disorientation, unspecified: Secondary | ICD-10-CM

## 2018-08-15 LAB — CBC
HCT: 34.9 % — ABNORMAL LOW (ref 36.0–46.0)
Hemoglobin: 11.7 g/dL — ABNORMAL LOW (ref 12.0–15.0)
MCH: 29.4 pg (ref 26.0–34.0)
MCHC: 33.5 g/dL (ref 30.0–36.0)
MCV: 87.7 fL (ref 78.0–100.0)
Platelets: 172 10*3/uL (ref 150–400)
RBC: 3.98 MIL/uL (ref 3.87–5.11)
RDW: 13.5 % (ref 11.5–15.5)
WBC: 7.9 10*3/uL (ref 4.0–10.5)

## 2018-08-15 LAB — BASIC METABOLIC PANEL
Anion gap: 8 (ref 5–15)
BUN: 5 mg/dL — AB (ref 8–23)
CO2: 28 mmol/L (ref 22–32)
Calcium: 9 mg/dL (ref 8.9–10.3)
Chloride: 100 mmol/L (ref 98–111)
Creatinine, Ser: 0.47 mg/dL (ref 0.44–1.00)
GFR calc Af Amer: >60 mL/min (ref >60)
GFR calc non Af Amer: >60 mL/min (ref >60)
GLUCOSE: 100 mg/dL — AB (ref 70–99)
POTASSIUM: 3.4 mmol/L — AB (ref 3.5–5.1)
Sodium: 136 mmol/L (ref 135–145)

## 2018-08-15 LAB — MAGNESIUM: Magnesium: 1.8 mg/dL (ref 1.7–2.4)

## 2018-08-15 LAB — GLUCOSE, CAPILLARY: Glucose-Capillary: 112 mg/dL — ABNORMAL HIGH (ref 70–99)

## 2018-08-15 MED ORDER — POLYETHYLENE GLYCOL 3350 17 G PO PACK: 17.0000 g | PACK | Freq: Every day | ORAL | Status: DC | PRN

## 2018-08-15 MED ORDER — BISACODYL 5 MG PO TBEC
5.0000 mg | DELAYED_RELEASE_TABLET | Freq: Once | ORAL | Status: AC
  Administered 2018-08-15: 5 mg via ORAL
  Filled 2018-08-15: qty 1

## 2018-08-15 NOTE — Care Management Note (Signed)
Case Management Note  Patient Details  Name: Tracey Holt MRN: [id number] Date of Birth: 1922/06/17  Subjective/Objective:      Pt admitted with hyponatremia. She is from [facility].              Action/Plan: Pt discharged back to [facility]. No f/u per PT. Pt's son providing transportation.   Expected Discharge Date:  08/15/18               Expected Discharge Plan:  Home/Self Care  In-House Referral:     Discharge planning Services     Post Acute Care Choice:    Choice offered to:     DME Arranged:    DME Agency:     HH Arranged:    HH Agency:     Status of Service:  Completed, signed off  If discussed at [employer] of Stay Meetings, dates discussed:    Additional Comments:  Pollie Friar, RN 08/15/2018, 3:49 PM

## 2018-08-15 NOTE — Progress Notes (Addendum)
Patient has become fixated on having a bowel movement.  RN contacted provider on call - New orders recieved   Patient has become verbally & physically aggressive, trying to get out of bed, states is going to go to the store to get her laxative because she has to have a laxative every single day  RN will continue to monitor patient

## 2018-08-15 NOTE — Progress Notes (Signed)
Patient slept for about 2 hours, since patient woke up patient has been very nice & pleasant with staff   RN will continue to monitor patient

## 2018-08-15 NOTE — Care Management Important Message (Signed)
Important Message  Patient Details  Name: Tracey Holt MRN: [id number] Date of Birth: 1922/10/28   Medicare Important Message Given:  Yes    Orbie Pyo 08/15/2018, 3:26 PM

## 2018-08-15 NOTE — Plan of Care (Signed)

## 2018-08-18 DIAGNOSIS — R410 Disorientation, unspecified: Secondary | ICD-10-CM

## 2018-08-18 LAB — CULTURE, BLOOD (ROUTINE X 2)
Culture: NO GROWTH
Culture: NO GROWTH
Special Requests: ADEQUATE

## 2018-08-18 NOTE — Discharge Summary (Signed)
Physician Discharge Summary  Tracey Holt UYQ:[id number] DOB: 15-Sep-1922 DOA: 08/13/2018  PCP: Mackie Pai, PA-C  Admit date: 08/13/2018 Discharge date: 08/18/2018  Time spent: 40 minutes  Recommendations for Outpatient Follow-up:  1. Follow up outpatient CBC/CMP  2. Continue to follow sodium intermittently 3. Consider f/u with dietician, liberalize diet as able   Discharge Diagnoses:  Active Problems:   S/P placement of cardiac pacemaker   Upper airway cough syndrome   CHB (complete heart block) (HCC)   Chronic systolic congestive heart failure (HCC)   Hyponatremia syndrome   Acute metabolic encephalopathy   Aspiration pneumonia of both lower lobes due to gastric secretions [facility])   Discharge Condition: stable  Diet recommendation: heart healthy  Filed Weights   08/13/18 0301 08/14/18 0435  Weight: 43.1 kg 44.8 kg    History of present illness:  [name] a84 y.o.female,with history of chronic systolic CHF (suspected stress-induced cardiomyopathy), symptomatic bradycardia with cardiac arrest in September 2017 status post dual-chamber pacemaker, COPD, hyperlipidemia, osteoporosis, resident of assisted living, mostly independent with her ADLs and will oriented was sent to the ED with shortness of breath, neck and occipital headache. Patient was found to be confused and somnolent. In the ED her vitals were stable and on exam was not found to be in respiratory distress. As per the son present at bedside patient is well oriented and had called him last night stating that she had some throat congestion. Around 3 in the morning the nursing home staff called her son stating that she was very confused and was sent to the ED. As per son patient is also very particular about her diet and is extremely strict with her sugar intake, salt intake and thinks she is not hydrating herself well. In the ED work-up showed sodium of 117, chloride of 84, (gap of 11), glucose of 114,  potassium of 3.7, normal BUN and creatinine. CBC was normal. Head CT was negative for acute findings. Chest x-ray showed bilateral atelectasis versus infiltrate. Patient had also received Tracey Holt dose of Ativan in the ED. Admitted to stepdown unit.  As per ED note and son at bedside patient was normal until yesterday. No recent fever, chills, nausea, vomiting, complains of chest pain, abdominal pain, dysuria, diarrhea, fall or trauma. No recent travel. No recent change in her medications. Patient was hospitalized back in May with intractable headache with negative work-up.  She was admitted for AMS and hyponatremia.  Her sodium corrected with hydration.  Nephrology suspected poor solute intake as most likely etiology.  Her mental status improved quickly and she was discharged with plan for outpatient follow up.   Hospital Course:  Acute Symptomatic Hyponatremia: Appreciate nephrology assistance, per our discussion, pt actually never received any hypertonic saline Per nephrology poor solute intake, tea/toast most likely etiology Initial urine osm 282, repeat 360 (initial urine Na 101, repeat 67) Na corrected faster than ideal, but pt is doing well.   Na 136 at discharge, continue to follow intermittently as outpatient  Acute toxic metabolic encephalopathy: Suspect 2/2 hyponatremia, seems back to baseline this morning Head CT without acute changes Required haldol and vest restraints for agitation, but improved at discharge  Concern for aspiration pneumonia CXR with bibasilar atelectasis vs infiltrate Pt appears well, PCT negative Will d/c abx and monitor Follow cx results  Hx of COPD No si/sx exacerbation, wean O2 as tolerated  Complete heart block S/p pacemaker placement  Hx HFrEF Appears euvolemic, most recent EF 50-55% 12/2016  Headache: mild, follow with APAP  Procedures:  none (  Consultations:  nephrology  Discharge Exam: Vitals:   08/15/18 0800 08/15/18  1200  BP: 136/73 (!) 142/83  Pulse: 83 79  Resp: 16 16  Temp: 98.3 F (36.8 C) 98 F (36.7 C)  SpO2: 94% 93%   Feels better. Ready to go home.  General: No acute distress. Cardiovascular: Heart sounds show Tracey Holt regular rate, and rhythm.  Lungs: Clear to auscultation bilaterally Abdomen: Soft, nontender, nondistended  Neurological: Alert and oriented 3. Moves all extremities 4. Cranial nerves II through XII grossly intact. Skin: Warm and dry. No rashes or lesions. Extremities: No clubbing or cyanosis. No edema.  Psychiatric: Mood and affect are normal. Insight and judgment are appropriate.  Discharge Instructions   Discharge Instructions    Call MD for:  difficulty breathing, headache or visual disturbances   Complete by:  As directed    Call MD for:  extreme fatigue   Complete by:  As directed    Call MD for:  persistant dizziness or light-headedness   Complete by:  As directed    Call MD for:  persistant nausea and vomiting   Complete by:  As directed    Call MD for:  severe uncontrolled pain   Complete by:  As directed    Call MD for:  temperature >100.4   Complete by:  As directed    Discharge instructions   Complete by:  As directed    You were seen for confusion and low sodium.  We think this may be related to your diet.  There were concerns that you may have been restrictive with your diet and salt intake and I would recommend against Tracey Holt restrictive diet at this time.   You've now improved with fluids.  Return for new, recurrent, or worsening symptoms.  Please ask your PCP to request records from this hospitalization so they know what was done and what the next steps will be.   Increase activity slowly   Complete by:  As directed      Allergies as of 08/15/2018      Reactions   Dust Mite Extract Shortness Of Breath   Lidex [fluocinonide] Swelling   Caused lip swelling   Molds & Smuts Shortness Of Breath   Lactose    Levaquin [levofloxacin In D5w]     Couldn't breath    Penicillins    Arms swelling    Rifampin    Couldn't breath       Medication List    TAKE these medications   acetaminophen 500 MG tablet Commonly known as:  TYLENOL Take 500-1,000 mg by mouth every 6 (six) hours as needed for moderate pain or headache.   albuterol 108 (90 Base) MCG/ACT inhaler Commonly known as:  PROVENTIL HFA;VENTOLIN HFA Inhale 2 puffs into the lungs every 6 (six) hours as needed for wheezing or shortness of breath.   amLODipine 2.5 MG tablet Commonly known as:  NORVASC Take 1 tablet (2.5 mg total) by mouth daily.   atorvastatin 10 MG tablet Commonly known as:  LIPITOR Take 1/2 tablet (5mg ) by mouth daily   azelastine 0.1 % nasal spray Commonly known as:  ASTELIN Place 1 spray into both nostrils 2 (two) times daily. Use in each nostril as directed   benzonatate 100 MG capsule Commonly known as:  TESSALON Take 1 capsule (100 mg total) by mouth 3 (three) times daily as needed for cough.   Biotin 10000 MCG Tabs Take 10,000 mcg by mouth daily.  ciclopirox 8 % solution Commonly known as:  PENLAC Apply topically at bedtime. Apply over nail and surrounding skin. Apply daily over previous coat. After seven (7) days, may remove with alcohol and continue cycle. What changed:  how much to take   CITRACAL + D PO Take 1 tablet by mouth 2 (two) times daily.   Co Q 10 100 MG Caps Take 100 mg by mouth daily.   docusate sodium 100 MG capsule Commonly known as:  COLACE Take 100 mg by mouth daily as needed for mild constipation.   fluticasone 50 MCG/ACT nasal spray Commonly known as:  FLONASE Place 1 spray into both nostrils daily as needed for allergies.   GLUCOS-CHONDROIT-MSM COMPLEX Tabs Take 2 tablets by mouth daily.   hydroxypropyl methylcellulose / hypromellose 2.5 % ophthalmic solution Commonly known as:  ISOPTO TEARS / GONIOVISC Place 1 drop into both eyes 3 (three) times daily.   ipratropium 17 MCG/ACT inhaler Commonly  known as:  ATROVENT HFA Inhale 2 puffs into the lungs every 6 (six) hours as needed for wheezing.   multivitamin with minerals tablet Take 1 tablet by mouth daily.   mupirocin cream 2 % Commonly known as:  BACTROBAN Apply 1 application topically 2 (two) times daily.   mupirocin ointment 2 % Commonly known as:  BACTROBAN Apply 1 application topically 2 (two) times daily as needed. What changed:  reasons to take this   Probiotic Caps Take 1 capsule by mouth daily.   Vitamin B Complex Tabs Take by mouth.   vitamin C 1000 MG tablet Take 1,000 mg by mouth daily.      Allergies  Allergen Reactions  . Dust Mite Extract Shortness Of Breath  . Lidex [Fluocinonide] Swelling    Caused lip swelling  . Molds & Smuts Shortness Of Breath  . Lactose   . Levaquin [Levofloxacin In D5w]     Couldn't breath   . Penicillins     Arms swelling   . Rifampin     Couldn't breath       The results of significant diagnostics from this hospitalization (including imaging, microbiology, ancillary and laboratory) are listed below for reference.    Significant Diagnostic Studies: Dg Chest 2 View  Result Date: 08/13/2018 CLINICAL DATA:  84 year old female with shortness of breath. EXAM: CHEST - 2 VIEW COMPARISON:  None. FINDINGS: There is diffuse chronic interstitial coarsening. Bibasilar streaky and hazy densities, left greater right may represent atelectasis or infiltrate. No large pleural effusion or pneumothorax. Mild enlargement of the cardiac silhouette. Atherosclerotic calcification of the aortic arch. Left pectoral pacemaker device. Osteopenia with degenerative changes of the spine and multiple age indeterminate, possibly old compression fractures and anterior wedging of the thoracic spine. No acute osseous pathology. IMPRESSION: Bibasilar atelectasis versus infiltrate. Electronically Signed   By: Anner Crete M.D.   On: 08/13/2018 04:22   Ct Head Wo Contrast  Result Date:  08/13/2018 CLINICAL DATA:  Altered mental status EXAM: CT HEAD WITHOUT CONTRAST TECHNIQUE: Contiguous axial images were obtained from the base of the skull through the vertex without intravenous contrast. COMPARISON:  None. FINDINGS: Brain: The ventricles are normal in size and configuration. There is parietal lobe atrophy bilaterally, moderate. There is no intracranial mass, hemorrhage, extra-axial fluid collection, or midline shift. There is mild small vessel disease in the centra semiovale bilaterally. The gray matter and white matter regions otherwise appear unremarkable. No acute infarct evident. Vascular: No hyperdense vessel. There is calcification in the carotid siphon regions bilaterally. Skull:  Bony calvarium appears intact. Sinuses/Orbits: There is mucosal thickening in several ethmoid air cells. Other paranasal sinuses are clear. Note that the frontal sinuses are aplastic. Orbits appear symmetric bilaterally. Other: Mastoid air cells are clear. IMPRESSION: Parietal lobe atrophy bilaterally. Ventricles appear normal in size and contour. There is mild periventricular small vessel disease. No acute infarct. No mass or hemorrhage. There are foci of arterial vascular calcification. There is mucosal thickening in several ethmoid air cells. Electronically Signed   By: Lowella Grip III M.D.   On: 08/13/2018 07:38    Microbiology: Recent Results (from the past 240 hour(s))  Culture, blood (routine x 2)     Status: None   Collection Time: 08/13/18 12:22 PM  Result Value Ref Range Status   Specimen Description BLOOD BLOOD RIGHT HAND  Final   Special Requests   Final    BOTTLES DRAWN AEROBIC ONLY Blood Culture results may not be optimal due to an inadequate volume of blood received in culture bottles   Culture   Final    NO GROWTH 5 DAYS Performed at [facility], [place] [address], [place], [place] [address]    Report Status 08/18/2018 FINAL  Final  Culture, blood (routine x 2)     Status:  None   Collection Time: 08/13/18 12:22 PM  Result Value Ref Range Status   Specimen Description BLOOD RIGHT ANTECUBITAL  Final   Special Requests   Final    BOTTLES DRAWN AEROBIC ONLY Blood Culture adequate volume   Culture   Final    NO GROWTH 5 DAYS Performed at [facility], [place] [address] [place], [place] [address]    Report Status 08/18/2018 FINAL  Final  MRSA PCR Screening     Status: None   Collection Time: 08/13/18  8:09 PM  Result Value Ref Range Status   MRSA by PCR NEGATIVE NEGATIVE Final    Comment:        The GeneXpert MRSA Assay (FDA approved for NASAL specimens only), is one component of Kaytlin Burklow comprehensive MRSA colonization surveillance program. It is not intended to diagnose MRSA infection nor to guide or monitor treatment for MRSA infections. Performed at [facility], [place] [address] [place], [place] [address]      Labs: Basic Metabolic Panel: Recent Labs  Lab 08/13/18 2222 08/14/18 0232 08/14/18 0544 08/14/18 1108 08/15/18 0506  NA 128* 130* 133* 131* 136  K 3.7 3.5 3.7 4.0 3.4*  CL 96* 99 99 99 100  CO2 26 27 28 24 28   GLUCOSE 97 84 85 96 100*  BUN 5* <5* <5* 5* 5*  CREATININE 0.52 0.61 0.50 0.51 0.47  CALCIUM 8.1* 8.1* 8.6* 8.4* 9.0  MG  --   --   --   --  1.8   Liver Function Tests: Recent Labs  Lab 08/13/18 0407  AST 44*  ALT 29  ALKPHOS 55  BILITOT 1.7*  PROT 5.9*  ALBUMIN 3.7   No results for input(s): LIPASE, AMYLASE in the last 168 hours. No results for input(s): AMMONIA in the last 168 hours. CBC: Recent Labs  Lab 08/13/18 0407 08/13/18 0948 08/14/18 0544 08/15/18 0506  WBC 5.7 6.4 5.1 7.9  NEUTROABS 4.2  --   --   --   HGB 11.7* 11.4* 11.7* 11.7*  HCT 35.4* 34.1* 35.3* 34.9*  MCV 88.7 88.3 88.3 87.7  PLT 180 145* 166 172   Cardiac Enzymes: No results for input(s): CKTOTAL, CKMB, CKMBINDEX, TROPONINI  in the last 168 hours. BNP: BNP (last 3 results) Recent Labs    08/13/18 0407  BNP 90.7     ProBNP (last 3 results) Recent Labs    01/02/18 1443  PROBNP 76.0    CBG: Recent Labs  Lab 08/13/18 1153 08/13/18 1516 08/13/18 1931 08/14/18 0646 08/15/18 0833  GLUCAP 121* 96 92 93 112*       Signed:  Fayrene Helper MD.  [facility] 08/18/2018, 6:23 PM

## 2018-08-20 ENCOUNTER — Telehealth: Payer: Self-pay | Admitting: Medical

## 2018-08-20 DIAGNOSIS — R69 Illness, unspecified: Secondary | ICD-10-CM | POA: Diagnosis not present

## 2018-08-20 NOTE — Telephone Encounter (Signed)
Copied from [place] [id number]. Topic: Quick Communication - See Telephone Encounter >> Aug 20, 2018 10:21 AM Tracey Holt wrote: CRM for notification. See Telephone encounter for: 08/20/18.  Pt called requesting Dr. Harvie Heck call something into pharmacy for fatigue. Advised pt that she may need appointment, pt declined stating that she has no transportation. Pt denied having any other symptoms-just stated that Dr could look at chart and see that she had recently visited ED. Please advise.

## 2018-08-20 NOTE — Telephone Encounter (Signed)
Agree that she needs appointment. Fatigue can be caused by a lot of things.Most importantly from low sodium. This is why she was hostpitalized. So she needs follow up and repeat cmp to check sodium as well as other causes.

## 2018-08-21 NOTE — Telephone Encounter (Signed)
Please call and schedule appointment.

## 2018-08-21 NOTE — Telephone Encounter (Signed)
Called pt she said she will have to call and see if she can get a ride. She is scheduled for 08/22/18 @ 1pm. Pt states she will call back if this time doesn't work.

## 2018-08-22 ENCOUNTER — Ambulatory Visit (HOSPITAL_BASED_OUTPATIENT_CLINIC_OR_DEPARTMENT_OTHER)
Admission: RE | Admit: 2018-08-22 | Discharge: 2018-08-22 | Disposition: A | Discharge status: Home / Self Care | Payer: Medicare HMO | Source: Ambulatory Visit | Attending: Medical | Admitting: Medical

## 2018-08-22 ENCOUNTER — Encounter: Payer: Self-pay | Admitting: Medical

## 2018-08-22 ENCOUNTER — Ambulatory Visit (INDEPENDENT_AMBULATORY_CARE_PROVIDER_SITE_OTHER): Payer: Medicare HMO | Admitting: Medical

## 2018-08-22 VITALS — BP 121/73 | HR 85 | Temp 98.3°F | Resp 16 | Ht 59.0 in | Wt 96.2 lb

## 2018-08-22 DIAGNOSIS — R05 Cough: Secondary | ICD-10-CM | POA: Insufficient documentation

## 2018-08-22 DIAGNOSIS — R059 Cough, unspecified: Secondary | ICD-10-CM

## 2018-08-22 DIAGNOSIS — R5383 Other fatigue: Secondary | ICD-10-CM

## 2018-08-22 DIAGNOSIS — E871 Hypo-osmolality and hyponatremia: Secondary | ICD-10-CM

## 2018-08-22 LAB — VITAMIN D 25 HYDROXY (VIT D DEFICIENCY, FRACTURES): VITD: 96.28 ng/mL (ref 30.00–100.00)

## 2018-08-22 LAB — COMPREHENSIVE METABOLIC PANEL
ALK PHOS: 63 U/L (ref 39–117)
ALT: 46 U/L — ABNORMAL HIGH (ref 0–35)
AST: 38 U/L — ABNORMAL HIGH (ref 0–37)
Albumin: 4 g/dL (ref 3.5–5.2)
BUN: 18 mg/dL (ref 6–23)
CO2: 34 mEq/L — ABNORMAL HIGH (ref 19–32)
Calcium: 9.4 mg/dL (ref 8.4–10.5)
Chloride: 97 mEq/L (ref 96–112)
Creatinine, Ser: 0.66 mg/dL (ref 0.40–1.20)
GFR: 88.57 mL/min (ref >60.00)
GLUCOSE: 95 mg/dL (ref 70–99)
Potassium: 4.4 mEq/L (ref 3.5–5.1)
Sodium: 135 mEq/L (ref 135–145)
Total Bilirubin: 0.5 mg/dL (ref 0.2–1.2)
Total Protein: 6.3 g/dL (ref 6.0–8.3)

## 2018-08-22 LAB — CBC WITH DIFFERENTIAL/PLATELET
Basophils Absolute: 0 10*3/uL (ref 0.0–0.1)
Basophils Relative: 0.9 % (ref 0.0–3.0)
Eosinophils Absolute: 0.1 10*3/uL (ref 0.0–0.7)
Eosinophils Relative: 2.7 % (ref 0.0–5.0)
HCT: 36.8 % (ref 36.0–46.0)
Hemoglobin: 12.2 g/dL (ref 12.0–15.0)
LYMPHS ABS: 0.8 10*3/uL (ref 0.7–4.0)
Lymphocytes Relative: 16.7 % (ref 12.0–46.0)
MCHC: 33.2 g/dL (ref 30.0–36.0)
MCV: 87.9 fl (ref 78.0–100.0)
MONOS PCT: 14.2 % — AB (ref 3.0–12.0)
Monocytes Absolute: 0.7 10*3/uL (ref 0.1–1.0)
NEUTROS ABS: 3.1 10*3/uL (ref 1.4–7.7)
Neutrophils Relative %: 65.5 % (ref 43.0–77.0)
PLATELETS: 276 10*3/uL (ref 150.0–400.0)
RBC: 4.18 Mil/uL (ref 3.87–5.11)
RDW: 14.4 % (ref 11.5–15.5)
WBC: 4.7 10*3/uL (ref 4.0–10.5)

## 2018-08-22 NOTE — Progress Notes (Signed)
Subjective:    Patient ID: Bryssa Tones, female    DOB: 1922/04/14, 84 y.o.   MRN: [id number]  HPI  Pt in for follow up from hospital. Pt admission date was 08/13/2018 and discharge was 08/15/2018.   Pt mental status improved since DC. She states feels mild fatigued. She states hard to remember to drink 6-8 glasses of water a day. Eating 3 small meals a day presently.  She does not rare occasional cough since dc. But no fever, no chills or sweats.    Below is HOP from ED.. [name] a45 y.o.female,with history of chronic systolic CHF (suspected stress-induced cardiomyopathy), symptomatic bradycardia with cardiac arrest in September 2017 status post dual-chamber pacemaker, COPD, hyperlipidemia, osteoporosis, resident of assisted living, mostly independent with her ADLs and will oriented was sent to the ED with shortness of breath, neck and occipital headache. Patient was found to be confused and somnolent. In the ED her vitals were stable and on exam was not found to be in respiratory distress. As per the son present at bedside patient is well oriented and had called him last night stating that she had some throat congestion. Around 3 in the morning the nursing home staff called her son stating that she was very confused and was sent to the ED. As per son patient is also very particular about her diet and is extremely strict with her sugar intake, salt intake and thinks she is not hydrating herself well. In the ED work-up showed sodium of 117, chloride of 84, (gap of 11), glucose of 114, potassium of 3.7, normal BUN and creatinine. CBC was normal. Head CT was negative for acute findings. Chest x-ray showed bilateral atelectasis versus infiltrate. Patient had also received a dose of Ativan in the ED. Admitted to stepdown unit.  As per ED note and son at bedside patient was normal until yesterday. No recent fever, chills, nausea, vomiting, complains of chest pain, abdominal  pain, dysuria, diarrhea, fall or trauma. No recent travel. No recent change in her medications. Patient was hospitalized back in May with intractable headache with negative work-up.  She was admitted for AMS and hyponatremia.  Her sodium corrected with hydration.  Nephrology suspected poor solute intake as most likely etiology.  Her mental status improved quickly and she was discharged with plan for outpatient follow up.  Hospital Course:  Acute Symptomatic Hyponatremia: Appreciate nephrology assistance, per our discussion, pt actually never received any hypertonic saline Per nephrology poor solute intake, tea/toast most likely etiology Initial urine osm 282, repeat 360 (initial urine Na 101, repeat 67) Na corrected faster than ideal, but pt is doing well.  Na 136 at discharge, continue to follow intermittently as outpatient  Acute toxic metabolic encephalopathy: Suspect 2/2 hyponatremia, seems back to baseline this morning Head CT without acute changes Required haldol and vest restraints for agitation, but improved at discharge  Concern for aspiration pneumonia CXR with bibasilar atelectasis vs infiltrate Pt appears well, PCT negative Will d/c abx and monitor Follow cx results  Hx of COPD No si/sx exacerbation, wean O2 as tolerated  Complete heart block S/p pacemaker placement  Hx HFrEF Appears euvolemic, most recent EF 50-55% 12/2016  Headache: mild, follow with APAP   Review of Systems  Constitutional: Positive for fatigue. Negative for chills, fever and unexpected weight change.  HENT: Negative for congestion, ear discharge and ear pain.   Respiratory: Negative for cough, chest tightness, shortness of breath and wheezing.   Cardiovascular: Negative for chest pain and palpitations.  Gastrointestinal: Negative for abdominal pain.  Genitourinary: Negative for flank pain, frequency, pelvic pain, urgency and vaginal pain.  Musculoskeletal: Negative for back pain,  gait problem, myalgias and neck pain.  Skin: Negative for rash.  Neurological: Negative for dizziness, speech difficulty, weakness and headaches.       No ha presently.  Hematological: Negative for adenopathy. Does not bruise/bleed easily.  Psychiatric/Behavioral: Negative for behavioral problems and confusion.   Past Medical History:  Diagnosis Date  . Bradycardic cardiac arrest ([employer]) 07/24/2016   Required CPR, intubation with multiple rib fractures. Had short term cardiac shock with acute heart failure.  Resulted in possible stress-induced cardiomyopathy  . Cardiac related syncope 07/24/2016   Bradycardic arrest requiring CPR 2 with transvenous pacemaker placed followed by permanent pacemaker; complicated by respiratory arrest, type II MI, stress-induced cardiopathy  . Cardiomyopathy ([place]) 07/2016   Moderate severely reduced EF with apical hypokinesis suggestive of stress-induced cardiac myopathy versus multivessel CAD --> in setting of her to cardiac arrest  . COPD (chronic obstructive pulmonary disease) ([place])    By report  . History of chronic constipation   . Hypercholesterolemia   . Osteoporosis   . Pacemaker 07/26/2016   Abbott dual-chamber pacemaker: [place] #[id number] - Serial [id number]  . Pneumonia 08/2016  . Sarcoidosis of lung ([place])   . Spinal stenosis      Social History   Socioeconomic History  . Marital status: Widowed    Spouse name: Not on file  . Number of children: Not on file  . Years of education: Not on file  . Highest education level: Not on file  Occupational History  . Not on file  Social Needs  . Financial resource strain: Not on file  . Food insecurity:    Worry: Not on file    Inability: Not on file  . Transportation needs:    Medical: Not on file    Non-medical: Not on file  Tobacco Use  . Smoking status: Former Smoker    Packs/day: 1.50    Years: 17.00    Pack years: 25.50    Types: Cigarettes    Start date: 12/18/1963     Last attempt to quit: 11/20/1983    Years since quitting: 34.7  . Smokeless tobacco: Never Used  Substance and Sexual Activity  . Alcohol use: No  . Drug use: No  . Sexual activity: Not on file  Lifestyle  . Physical activity:    Days per week: Not on file    Minutes per session: Not on file  . Stress: Not on file  Relationships  . Social connections:    Talks on phone: Not on file    Gets together: Not on file    Attends religious service: Not on file    Active member of club or organization: Not on file    Attends meetings of clubs or organizations: Not on file    Relationship status: Not on file  . Intimate partner violence:    Fear of current or ex partner: Not on file    Emotionally abused: Not on file    Physically abused: Not on file    Forced sexual activity: Not on file  Other Topics Concern  . Not on file  Social History Narrative   Recently moved to [place] from [place] to live near her daughter.    Past Surgical History:  Procedure Laterality Date  . ADENOIDECTOMY    . PACEMAKER INSERTION Left 07/26/2016   Abbott  Assurity Model [id number] - Serial #[id number] -- complicated by pneumothorax requiring chest tube placement  . TEE WITHOUT CARDIOVERSION  02/2013   EF 55-60% with normal global function. No thrombus, mass or vegetation. Trivial-small pericardial effusion. Moderate LA dilation. Mild MR.  . TONSILLECTOMY    . TRANSTHORACIC ECHOCARDIOGRAM  07/24/2016   [facility]: EF 45-50%. Mid and apical inferior, inferolateral and apical hypokinesis. GR 2 DD. Mild LA and moderate RA dilation. Mild paracardial effusion. Moderate to severe TR. Basal and mid segment hypokinesis with apical hypokinesis. - Suspect stress-induced cardiopathy versus multivessel CAD.  Marland Kitchen TRANSTHORACIC ECHOCARDIOGRAM  07/26/2016   [place]: LIMITED (post PPM) - although the report suggested EF 30-35% with apical akinesis, rounding note  suggested this was an improvement    Family History  Problem Relation Age of Onset  . Allergic rhinitis Neg Hx   . Angioedema Neg Hx   . Asthma Neg Hx   . Eczema Neg Hx   . Immunodeficiency Neg Hx   . Urticaria Neg Hx     Allergies  Allergen Reactions  . Dust Mite Extract Shortness Of Breath  . Lidex [Fluocinonide] Swelling    Caused lip swelling  . Molds & Smuts Shortness Of Breath  . Lactose   . Levaquin [Levofloxacin In D5w]     Couldn't breath   . Penicillins     Arms swelling   . Rifampin     Couldn't breath     Current Outpatient Medications on File Prior to Visit  Medication Sig Dispense Refill  . acetaminophen (TYLENOL) 500 MG tablet Take 500-1,000 mg by mouth every 6 (six) hours as needed for moderate pain or headache.    . albuterol (PROVENTIL HFA;VENTOLIN HFA) 108 (90 Base) MCG/ACT inhaler Inhale 2 puffs into the lungs every 6 (six) hours as needed for wheezing or shortness of breath. 1 Inhaler 2  . amLODipine (NORVASC) 2.5 MG tablet Take 1 tablet (2.5 mg total) by mouth daily. 30 tablet 5  . Ascorbic Acid (VITAMIN C) 1000 MG tablet Take 1,000 mg by mouth daily.    Marland Kitchen atorvastatin (LIPITOR) 10 MG tablet Take 1/2 tablet (5mg ) by mouth daily 45 tablet 3  . B Complex Vitamins (VITAMIN B COMPLEX) TABS Take by mouth.    . Biotin 10000 MCG TABS Take 10,000 mcg by mouth daily.     . Calcium Citrate-Vitamin D (CITRACAL + D PO) Take 1 tablet by mouth 2 (two) times daily.    . ciclopirox (PENLAC) 8 % solution Apply topically at bedtime. Apply over nail and surrounding skin. Apply daily over previous coat. After seven (7) days, may remove with alcohol and continue cycle. (Patient taking differently: Apply 1 application topically at bedtime. Apply over nail and surrounding skin. Apply daily over previous coat. After seven (7) days, may remove with alcohol and continue cycle.) 6.6 mL 0  . Coenzyme Q10 (CO Q 10) 100 MG CAPS Take 100 mg by mouth daily.    Marland Kitchen docusate sodium (COLACE)  100 MG capsule Take 100 mg by mouth daily as needed for mild constipation.     . fluticasone (FLONASE) 50 MCG/ACT nasal spray Place 1 spray into both nostrils daily as needed for allergies.     . hydroxypropyl methylcellulose / hypromellose (ISOPTO TEARS / GONIOVISC) 2.5 % ophthalmic solution Place 1 drop into both eyes 3 (three) times daily.     Marland Kitchen ipratropium (ATROVENT HFA) 17 MCG/ACT inhaler Inhale 2 puffs into the lungs every 6 (  six) hours as needed for wheezing. 1 Inhaler 2  . Misc Natural Products (GLUCOS-CHONDROIT-MSM COMPLEX) TABS Take 2 tablets by mouth daily.    . Multiple Vitamins-Minerals (MULTIVITAMIN WITH MINERALS) tablet Take 1 tablet by mouth daily.    . mupirocin cream (BACTROBAN) 2 % Apply 1 application topically 2 (two) times daily. 30 g 0  . mupirocin ointment (BACTROBAN) 2 % Apply 1 application topically 2 (two) times daily as needed. (Patient taking differently: Apply 1 application topically 2 (two) times daily as needed (rash). ) 22 g   . Probiotic CAPS Take 1 capsule by mouth daily.     No current facility-administered medications on file prior to visit.     BP 121/73   Pulse 85   Temp 98.3 F (36.8 C) (Oral)   Resp 16   Ht 4\' 11"  (1.499 m)   Wt 96 lb 3.2 oz (43.6 kg)   SpO2 95%   BMI 19.43 kg/m       Objective:   Physical Exam  General Mental Status- Alert. General Appearance- Not in acute distress. Alert, oriented x 3.  Skin General: Color- Normal Color. Moisture- Normal Moisture.  Neck Carotid Arteries- Normal color. Moisture- Normal Moisture. No carotid bruits. No JVD.  Chest and Lung Exam Auscultation: Breath Sounds:-Normal.  Cardiovascular Auscultation:Rythm- Regular. Murmurs & Other Heart Sounds:Auscultation of the heart reveals- No Murmurs.  Abdomen Inspection:-Inspeection Normal. Palpation/Percussion:Note:No mass. Palpation and Percussion of the abdomen reveal- Non Tender, Non Distended + BS, no rebound or  guarding.   Neurologic Cranial Nerve exam:- CN III-XII intact(No nystagmus), symmetric smile. Strength:- 5/5 equal and symmetric strength both upper and lower extremities.      Assessment & Plan:  Your recently hospitalized for low sodium levels as well as altered mental status.  A nephrologist consulted for the hospital and it was determined that he likely had poor solute intake.  You have good mental status now and we need to repeat labs to evaluate your sodium level as well as your reported mild fatigue recently.  Is very important that you hydrate adequately and in light of your recent low sodium would recommend at least 3-6 bottles of Gatorade 0 a day potentially more depending on sodium levels.  Also please make sure that eating 3 meals a day.  For recent fatigue will include CMP, CBC B1 and vitamin D level.  Not getting B12 or thyroid as those were done while you are hospitalized.  Follow-up date to be determined after lab review.  Please keep your cell phone charged and your volume up so I can call you later with the lab results.  Mackie Pai, PA-C

## 2018-08-22 NOTE — Patient Instructions (Addendum)
Your recently hospitalized for low sodium levels as well as altered mental status.  A nephrologist consulted for the hospital and it was determined that he likely had poor solute intake.  You have good mental status now and we need to repeat labs to evaluate your sodium level as well as your reported mild fatigue recently.  Is very important that you hydrate adequately and in light of your recent low sodium would recommend at least 3-6 bottles of Gatorade 0 a day potentially more depending on sodium levels.  Also please make sure that eating 3 meals a day.  For recent fatigue will include CMP, CBC B1 and vitamin D level.  Not getting B12 or thyroid as those were done while you are hospitalized.  Follow-up date to be determined after lab review.  Please keep your cell phone charged and your volume up so I can call you later with the lab results.

## 2018-08-25 LAB — VITAMIN B1: Vitamin B1 (Thiamine): 20 nmol/L (ref 8–30)

## 2018-09-02 ENCOUNTER — Telehealth: Payer: Self-pay | Admitting: Genetic Counselor

## 2018-09-02 ENCOUNTER — Encounter: Payer: Self-pay | Admitting: Genetic Counselor

## 2018-09-02 NOTE — Telephone Encounter (Signed)
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  Discussed that there were two ways to work with the mosaic - either do a skin bx to learn if we can find this in the skin, or test her daughter to see if she has this.  The patient is anxious to get this information for her daughter and did not want to set up a return appointment at this time.  I let her know that her nieces and nephews have 90 days to be tested.  She has already contacted them.  She will call me to come in and discuss this in a month or so. 

## 2018-09-02 NOTE — Telephone Encounter (Signed)
This telephone note was made in error in the wrong [facility] file.  Please disregard.

## 2018-09-04 ENCOUNTER — Telehealth: Payer: Self-pay

## 2018-09-04 MED ORDER — AZITHROMYCIN 250 MG PO TABS: ORAL_TABLET | ORAL | 0 refills | Status: DC

## 2018-09-04 NOTE — Telephone Encounter (Signed)
Sent in azithromycin last night. Notify pt. Thought better to send in as we are approaching weekend. She can start but if not feeling well by Monday be seen. If worsens overweekend then ED evaluation.   Probably would be difficult getting her in tomorrow before the weekend.

## 2018-09-04 NOTE — Telephone Encounter (Signed)
Copied from [place] [phone]. Topic: General - Other >> Sep 04, 2018  3:34 PM Yvette Rack wrote: Reason for CRM: Pt states she would like to ask Mackie Pai to prescribe her a Zpack for a deep cough that she is experiencing. Pt requests Rx be sent to [id number] [address], [place] [phone] (Phone)  [phone] (Fax)

## 2018-09-05 NOTE — Telephone Encounter (Signed)
Notified pt of instruction pt voiced understanding.

## 2018-09-11 ENCOUNTER — Ambulatory Visit: Payer: Self-pay

## 2018-09-11 NOTE — Telephone Encounter (Signed)
Author spoke to [facility] nurse who triaged pt., and per nurse, pt. was very vague in symptoms. Author reviewed most recent visit note 10/4 from PCP, that detailed needing to stay hydrated with gatorade for electrolyte replacement, and author encouraged [facility] nurse to relay said information. Per nurse, pt. was non-specific about what she was drinking/eating, as well as her urination status. [place] nurse said she would make an appointment for pt. On 10/25 in the meantime with PCP.

## 2018-09-11 NOTE — Telephone Encounter (Addendum)
Patient called in with c/o "weakness." She says "I am just not feeling good and I need to come to see Percell Miller today." I asked to explain symptoms she's having, she says "I don't know, my head is feeling like I am going to have a headache, but not hurting. I just don't feel good." I asked about her urination, she says "I'm urinating fine, no burning, no problems." I asked about pain, she says "no pain." I asked about weakness, she says "I do feel a little weak. I finished the Z-pack a couple of days ago and I still don't feel any better." I asked how long has she been feeling like this, she says "last night, this morning, I don't know." I asked about other symptoms, she says "I have a cough, something comes up sometimes." She coughed while talking and her cough was tight and congested sounding. I asked what color phlegm, she says "I don't know." Patient seems frustrated with the questions. I advised no openings today with Percell Miller, but he has openings tomorrow. She says "well, I guess I'll take it. I don't know how I'll be feeling tomorrow, I may call and cancel." I advised to come in to the office tomorrow, especially with the weekend approaching, to keep from getting worse and ending up in the hospital over the weekend, she verbalized understanding. Appointment scheduled for tomorrow at 1340 with Mackie Pai, [facility], care advice given, advised patient to drink Gatorade as instructed by Percell Miller at the last OV note, patient verbalized understanding.   Reason for Disposition . [1] MILD weakness (i.e., does not interfere with ability to work, go to school, normal activities) AND [2] persists > 1 week  Answer Assessment - Initial Assessment Questions 1. DESCRIPTION: "Describe how you are feeling."     Just don't feel good 2. SEVERITY: "How bad is it?"  "Can you stand and walk?"   - MILD - Feels weak or tired, but does not interfere with work, school or normal activities   - [place] to stand and walk;  weakness interferes with work, school, or normal activities   - SEVERE - Unable to stand or walk     Mild 3. ONSET:  "When did the weakness begin?"     Last night or this morning 4. CAUSE: "What do you think is causing the weakness?"     I don't care 5. MEDICINES: "Have you recently started a new medicine or had a change in the amount of a medicine?"     Finished the Z-pack 2 days ago 6. OTHER SYMPTOMS: "Do you have any other symptoms?" (e.g., chest pain, fever, cough, SOB, vomiting, diarrhea, bleeding, other areas of pain)     Cough 7. PREGNANCY: "Is there any chance you are pregnant?" "When was your last menstrual period?"     No  Protocols used: WEAKNESS (GENERALIZED) AND FATIGUE-A-AH

## 2018-09-12 ENCOUNTER — Ambulatory Visit (HOSPITAL_BASED_OUTPATIENT_CLINIC_OR_DEPARTMENT_OTHER)
Admission: RE | Admit: 2018-09-12 | Discharge: 2018-09-12 | Disposition: A | Discharge status: Home / Self Care | Payer: Medicare HMO | Source: Ambulatory Visit | Attending: Medical | Admitting: Medical

## 2018-09-12 ENCOUNTER — Telehealth: Payer: Self-pay | Admitting: Medical

## 2018-09-12 ENCOUNTER — Ambulatory Visit (INDEPENDENT_AMBULATORY_CARE_PROVIDER_SITE_OTHER): Payer: Medicare HMO | Admitting: Medical

## 2018-09-12 ENCOUNTER — Encounter: Payer: Self-pay | Admitting: Medical

## 2018-09-12 VITALS — BP 125/71 | HR 89 | Temp 97.9°F | Resp 16 | Ht 59.0 in | Wt 93.2 lb

## 2018-09-12 DIAGNOSIS — M4854XA Collapsed vertebra, not elsewhere classified, thoracic region, initial encounter for fracture: Secondary | ICD-10-CM | POA: Insufficient documentation

## 2018-09-12 DIAGNOSIS — R059 Cough, unspecified: Secondary | ICD-10-CM

## 2018-09-12 DIAGNOSIS — R918 Other nonspecific abnormal finding of lung field: Secondary | ICD-10-CM | POA: Diagnosis not present

## 2018-09-12 DIAGNOSIS — R05 Cough: Secondary | ICD-10-CM | POA: Insufficient documentation

## 2018-09-12 DIAGNOSIS — R5383 Other fatigue: Secondary | ICD-10-CM

## 2018-09-12 DIAGNOSIS — K449 Diaphragmatic hernia without obstruction or gangrene: Secondary | ICD-10-CM | POA: Diagnosis not present

## 2018-09-12 DIAGNOSIS — J40 Bronchitis, not specified as acute or chronic: Secondary | ICD-10-CM | POA: Diagnosis not present

## 2018-09-12 DIAGNOSIS — I517 Cardiomegaly: Secondary | ICD-10-CM | POA: Insufficient documentation

## 2018-09-12 MED ORDER — SULFAMETHOXAZOLE-TRIMETHOPRIM 800-160 MG PO TABS: 1.0000 | ORAL_TABLET | Freq: Two times a day (BID) | ORAL | 0 refills | Status: DC

## 2018-09-12 MED ORDER — BENZONATATE 100 MG PO CAPS: 100.0000 mg | ORAL_CAPSULE | Freq: Three times a day (TID) | ORAL | 0 refills | Status: DC | PRN

## 2018-09-12 MED ORDER — IPRATROPIUM BROMIDE HFA 17 MCG/ACT IN AERS: 2.0000 | INHALATION_SPRAY | Freq: Four times a day (QID) | RESPIRATORY_TRACT | 2 refills | Status: DC | PRN

## 2018-09-12 MED ORDER — ALBUTEROL SULFATE HFA 108 (90 BASE) MCG/ACT IN AERS: 2.0000 | INHALATION_SPRAY | Freq: Four times a day (QID) | RESPIRATORY_TRACT | 2 refills | Status: DC | PRN

## 2018-09-12 NOTE — Progress Notes (Signed)
Subjective:    Patient ID: Tracey Holt, female    DOB: 01-20-1922, 84 y.o.   MRN: [id number]  HPI  Pt in for productive cough and chest congestion on and off for 3 weeks. Pt states one of person where she lives has cancer and has constant cough.  No fever, no chills or sweats. Pt was very tired yesterday but feels better/more energy today.  On September 04, 2018 I got request to call in zpack. She was reporting cough and saw epic message. Sent in antibiotic, before weekend. Pt thinks she took it but is not sure. Then she states did take. Called pharmacy and she did pick up rx.  Pt states she has not bee wheezing  Review of Systems  Constitutional: Positive for fatigue. Negative for chills and fever.  HENT: Positive for congestion. Negative for ear pain.   Respiratory: Positive for cough. Negative for chest tightness, shortness of breath and wheezing.   Cardiovascular: Negative for chest pain and palpitations.  Gastrointestinal: Negative for abdominal distention and abdominal pain.  Genitourinary: Negative for difficulty urinating, dysuria and hematuria.  Musculoskeletal: Negative for back pain and joint swelling.  Skin: Negative for rash.  Neurological: Negative for dizziness, seizures, weakness and numbness.  Hematological: Negative for adenopathy. Does not bruise/bleed easily.  Psychiatric/Behavioral: Negative for behavioral problems and confusion.    Past Medical History:  Diagnosis Date  . Bradycardic cardiac arrest ([place]) 07/24/2016   Required CPR, intubation with multiple rib fractures. Had short term cardiac shock with acute heart failure.  Resulted in possible stress-induced cardiomyopathy  . Cardiac related syncope 07/24/2016   Bradycardic arrest requiring CPR 2 with transvenous pacemaker placed followed by permanent pacemaker; complicated by respiratory arrest, type II MI, stress-induced cardiopathy  . Cardiomyopathy ([place]) 07/2016   Moderate severely reduced EF with  apical hypokinesis suggestive of stress-induced cardiac myopathy versus multivessel CAD --> in setting of her to cardiac arrest  . COPD (chronic obstructive pulmonary disease) ([place])    By report  . History of chronic constipation   . Hypercholesterolemia   . Osteoporosis   . Pacemaker 07/26/2016   Abbott dual-chamber pacemaker: [place] #[id number] - Serial [id number]  . Pneumonia 08/2016  . Sarcoidosis of lung ([place])   . Spinal stenosis      Social History   Socioeconomic History  . Marital status: Widowed    Spouse name: Not on file  . Number of children: Not on file  . Years of education: Not on file  . Highest education level: Not on file  Occupational History  . Not on file  Social Needs  . Financial resource strain: Not on file  . Food insecurity:    Worry: Not on file    Inability: Not on file  . Transportation needs:    Medical: Not on file    Non-medical: Not on file  Tobacco Use  . Smoking status: Former Smoker    Packs/day: 1.50    Years: 17.00    Pack years: 25.50    Types: Cigarettes    Start date: 12/18/1963    Last attempt to quit: 11/20/1983    Years since quitting: 34.8  . Smokeless tobacco: Never Used  Substance and Sexual Activity  . Alcohol use: No  . Drug use: No  . Sexual activity: Not on file  Lifestyle  . Physical activity:    Days per week: Not on file    Minutes per session: Not on file  . Stress: Not on  file  Relationships  . Social connections:    Talks on phone: Not on file    Gets together: Not on file    Attends religious service: Not on file    Active member of club or organization: Not on file    Attends meetings of clubs or organizations: Not on file    Relationship status: Not on file  . Intimate partner violence:    Fear of current or ex partner: Not on file    Emotionally abused: Not on file    Physically abused: Not on file    Forced sexual activity: Not on file  Other Topics Concern  . Not on file  Social  History Narrative   Recently moved to [place] from [place] to live near her daughter.    Past Surgical History:  Procedure Laterality Date  . ADENOIDECTOMY    . PACEMAKER INSERTION Left 07/26/2016   Abbott Assurity Model #[id number] - Serial #[id number] -- complicated by pneumothorax requiring chest tube placement  . TEE WITHOUT CARDIOVERSION  02/2013   EF 55-60% with normal global function. No thrombus, mass or vegetation. Trivial-small pericardial effusion. Moderate LA dilation. Mild MR.  . TONSILLECTOMY    . TRANSTHORACIC ECHOCARDIOGRAM  07/24/2016   [facility]: EF 45-50%. Mid and apical inferior, inferolateral and apical hypokinesis. GR 2 DD. Mild LA and moderate RA dilation. Mild paracardial effusion. Moderate to severe TR. Basal and mid segment hypokinesis with apical hypokinesis. - Suspect stress-induced cardiopathy versus multivessel CAD.  Marland Kitchen TRANSTHORACIC ECHOCARDIOGRAM  07/26/2016   [place]: LIMITED (post PPM) - although the report suggested EF 30-35% with apical akinesis, rounding note suggested this was an improvement    Family History  Problem Relation Age of Onset  . Allergic rhinitis Neg Hx   . Angioedema Neg Hx   . Asthma Neg Hx   . Eczema Neg Hx   . Immunodeficiency Neg Hx   . Urticaria Neg Hx     Allergies  Allergen Reactions  . Dust Mite Extract Shortness Of Breath  . Lidex [Fluocinonide] Swelling    Caused lip swelling  . Molds & Smuts Shortness Of Breath  . Lactose   . Levaquin [Levofloxacin In D5w]     Couldn't breath   . Penicillins     Arms swelling   . Rifampin     Couldn't breath     Current Outpatient Medications on File Prior to Visit  Medication Sig Dispense Refill  . acetaminophen (TYLENOL) 500 MG tablet Take 500-1,000 mg by mouth every 6 (six) hours as needed for moderate pain or headache.    Marland Kitchen amLODipine (NORVASC) 2.5 MG tablet Take 1 tablet (2.5 mg total) by mouth daily. 30 tablet 5    . Ascorbic Acid (VITAMIN C) 1000 MG tablet Take 1,000 mg by mouth daily.    Marland Kitchen atorvastatin (LIPITOR) 10 MG tablet Take 1/2 tablet (5mg ) by mouth daily 45 tablet 3  . azithromycin (ZITHROMAX) 250 MG tablet Take 2 tablets by mouth on day 1, followed by 1 tablet by mouth daily for 4 days. 6 tablet 0  . B Complex Vitamins (VITAMIN B COMPLEX) TABS Take by mouth.    . Biotin 10000 MCG TABS Take 10,000 mcg by mouth daily.     . Calcium Citrate-Vitamin D (CITRACAL + D PO) Take 1 tablet by mouth 2 (two) times daily.    . ciclopirox (PENLAC) 8 % solution Apply topically at bedtime. Apply over nail and surrounding skin.  Apply daily over previous coat. After seven (7) days, may remove with alcohol and continue cycle. (Patient taking differently: Apply 1 application topically at bedtime. Apply over nail and surrounding skin. Apply daily over previous coat. After seven (7) days, may remove with alcohol and continue cycle.) 6.6 mL 0  . Coenzyme Q10 (CO Q 10) 100 MG CAPS Take 100 mg by mouth daily.    Marland Kitchen docusate sodium (COLACE) 100 MG capsule Take 100 mg by mouth daily as needed for mild constipation.     . fluticasone (FLONASE) 50 MCG/ACT nasal spray Place 1 spray into both nostrils daily as needed for allergies.     . hydroxypropyl methylcellulose / hypromellose (ISOPTO TEARS / GONIOVISC) 2.5 % ophthalmic solution Place 1 drop into both eyes 3 (three) times daily.     . Misc Natural Products (GLUCOS-CHONDROIT-MSM COMPLEX) TABS Take 2 tablets by mouth daily.    . Multiple Vitamins-Minerals (MULTIVITAMIN WITH MINERALS) tablet Take 1 tablet by mouth daily.    . mupirocin cream (BACTROBAN) 2 % Apply 1 application topically 2 (two) times daily. 30 g 0  . mupirocin ointment (BACTROBAN) 2 % Apply 1 application topically 2 (two) times daily as needed. (Patient taking differently: Apply 1 application topically 2 (two) times daily as needed (rash). ) 22 g   . Probiotic CAPS Take 1 capsule by mouth daily.    Marland Kitchen albuterol  (PROVENTIL HFA;VENTOLIN HFA) 108 (90 Base) MCG/ACT inhaler Inhale 2 puffs into the lungs every 6 (six) hours as needed for wheezing or shortness of breath. (Patient not taking: Reported on 09/12/2018) 1 Inhaler 2  . ipratropium (ATROVENT HFA) 17 MCG/ACT inhaler Inhale 2 puffs into the lungs every 6 (six) hours as needed for wheezing. (Patient not taking: Reported on 09/12/2018) 1 Inhaler 2   No current facility-administered medications on file prior to visit.     BP 125/71   Pulse 89   Temp 97.9 F (36.6 C) (Oral)   Resp 16   Ht 4\' 11"  (1.499 m)   Wt 93 lb 3.2 oz (42.3 kg)   SpO2 99%   BMI 18.82 kg/m       Objective:   Physical Exam  General  Mental Status - Alert. General Appearance - Well groomed. Not in acute distress.  Skin Rashes- No Rashes.  HEENT Head- Normal. Ear Auditory Canal - Left- Normal. Right - Normal.Tympanic Membrane- Left- Normal. Right- Normal. Eye Sclera/Conjunctiva- Left- Normal. Right- Normal. Nose & Sinuses Nasal Mucosa- Left-  Boggy and Congested. Right-  Boggy and  Congested.Bilateral no  maxillary and no  frontal sinus pressure. Mouth & Throat Lips: Upper Lip- Normal: no dryness, cracking, pallor, cyanosis, or vesicular eruption. Lower Lip-Normal: no dryness, cracking, pallor, cyanosis or vesicular eruption. Buccal Mucosa- Bilateral- No Aphthous ulcers. Oropharynx- No Discharge or Erythema. Tonsils: Characteristics- Bilateral- No Erythema or Congestion. Size/Enlargement- Bilateral- No enlargement. Discharge- bilateral-None.  Neck Neck- Supple. No Masses.   Chest and Lung Exam Auscultation: Breath Sounds:-Clear even and unlabored. Faint rough upper breath sounds.  Cardiovascular Auscultation:Rythm- Regular, rate and rhythm. Murmurs & Other Heart Sounds:Ausculatation of the heart reveal- No Murmurs.  Lymphatic Head & Neck General Head & Neck Lymphatics: Bilateral: Description- No Localized lymphadenopathy.  Lower ext- no pedal  edema.       Assessment & Plan:  You have had symptoms of bronchitis recently.  With your age want to get a chest x-ray and make sure no pneumonia present.  For cough, I prescribed benzonatate.  If you have  any wheezing you have both Atrovent and albuterol available to use.  I will likely prescribe another antibiotic but want to get chest x-ray results back before sending in a new antibiotic.  Antibiotic side effects/allergy history limits choice of antibiotics.  We will get CBC and metabolic panel today.  Follow-up in 7 to 10 days or as needed.  Mackie Pai, PA-C

## 2018-09-12 NOTE — Telephone Encounter (Signed)
Rx bactrim DS sent to pt pharmacy. 

## 2018-09-12 NOTE — Patient Instructions (Addendum)
You have had symptoms of bronchitis recently.  With your age want to get a chest x-ray and make sure no pneumonia present.  For cough, I prescribed benzonatate.  If you have any wheezing you have both Atrovent and albuterol available to use.  I will likely prescribe another antibiotic but want to get chest x-ray results back before sending in a new antibiotic.  Antibiotic side effects/allergy history limits choice of antibiotics.  We will get CBC and metabolic panel today.  Follow-up in 7 to 10 days or as needed.

## 2018-09-13 LAB — COMPREHENSIVE METABOLIC PANEL
AG Ratio: 1.8 (calc) (ref 1.0–2.5)
ALT: 27 U/L (ref 6–29)
AST: 28 U/L (ref 10–35)
Albumin: 4.1 g/dL (ref 3.6–5.1)
Alkaline phosphatase (APISO): 61 U/L (ref 33–130)
BILIRUBIN TOTAL: 0.5 mg/dL (ref 0.2–1.2)
BUN: 15 mg/dL (ref 7–25)
CALCIUM: 9.4 mg/dL (ref 8.6–10.4)
CHLORIDE: 101 mmol/L (ref 98–110)
CO2: 30 mmol/L (ref 20–32)
Creat: 0.68 mg/dL (ref 0.60–0.88)
GLOBULIN: 2.3 g/dL (ref 1.9–3.7)
GLUCOSE: 85 mg/dL (ref 65–99)
Potassium: 4.9 mmol/L (ref 3.5–5.3)
SODIUM: 137 mmol/L (ref 135–146)
TOTAL PROTEIN: 6.4 g/dL (ref 6.1–8.1)

## 2018-09-13 LAB — CBC WITH DIFFERENTIAL/PLATELET
BASOS PCT: 1 %
Basophils Absolute: 49 cells/uL (ref 0–200)
EOS ABS: 88 {cells}/uL (ref 15–500)
Eosinophils Relative: 1.8 %
HEMATOCRIT: 38.4 % (ref 35.0–45.0)
HEMOGLOBIN: 12.6 g/dL (ref 11.7–15.5)
LYMPHS ABS: 1107 {cells}/uL (ref 850–3900)
MCH: 29.2 pg (ref 27.0–33.0)
MCHC: 32.8 g/dL (ref 32.0–36.0)
MCV: 89.1 fL (ref 80.0–100.0)
MPV: 11 fL (ref 7.5–12.5)
Monocytes Relative: 11.7 %
Neutro Abs: 3082 cells/uL (ref 1500–7800)
Neutrophils Relative %: 62.9 %
Platelets: 229 10*3/uL (ref 140–400)
RBC: 4.31 10*6/uL (ref 3.80–5.10)
RDW: 13 % (ref 11.0–15.0)
Total Lymphocyte: 22.6 %
WBC: 4.9 10*3/uL (ref 3.8–10.8)
WBCMIX: 573 {cells}/uL (ref 200–950)

## 2018-09-17 ENCOUNTER — Ambulatory Visit (INDEPENDENT_AMBULATORY_CARE_PROVIDER_SITE_OTHER): Payer: Medicare HMO | Admitting: *Deleted

## 2018-09-17 DIAGNOSIS — I442 Atrioventricular block, complete: Secondary | ICD-10-CM | POA: Diagnosis not present

## 2018-09-17 DIAGNOSIS — I5022 Chronic systolic (congestive) heart failure: Secondary | ICD-10-CM

## 2018-09-17 NOTE — Progress Notes (Signed)
Remote pacemaker transmission.   

## 2018-09-18 ENCOUNTER — Ambulatory Visit: Payer: Medicare HMO | Admitting: Medical

## 2018-09-19 ENCOUNTER — Encounter: Payer: Self-pay | Admitting: Medical

## 2018-09-19 ENCOUNTER — Ambulatory Visit (INDEPENDENT_AMBULATORY_CARE_PROVIDER_SITE_OTHER): Payer: Medicare HMO | Admitting: Medical

## 2018-09-19 VITALS — BP 127/78 | HR 71 | Temp 98.3°F | Resp 16 | Ht 59.0 in | Wt 94.0 lb

## 2018-09-19 DIAGNOSIS — R059 Cough, unspecified: Secondary | ICD-10-CM

## 2018-09-19 DIAGNOSIS — J189 Pneumonia, unspecified organism: Secondary | ICD-10-CM | POA: Diagnosis not present

## 2018-09-19 DIAGNOSIS — R05 Cough: Secondary | ICD-10-CM | POA: Diagnosis not present

## 2018-09-19 NOTE — Progress Notes (Signed)
Subjective:    Patient ID: Tracey Holt, female    DOB: 1922-01-20, 84 y.o.   MRN: [id number]  HPI   Pt in states overall feeling better. Cough still present but some better(not productive). 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 Pt stayed up to 10 pm last night. But if she gets good night sleep has adequate energy.  No wheezing or sob.   No fever, no chills, or chills.   Pt is using benzonatate and bactrim. Pt cxr came back  showing possible pneumonia.    Review of Systems  Constitutional: Negative for chills, fatigue and fever.  HENT: Negative for congestion, dental problem, ear discharge and ear pain.   Respiratory: Positive for cough. Negative for chest tightness, shortness of breath and wheezing.        Less cough.  Cardiovascular: Negative for chest pain and palpitations.  Gastrointestinal: Negative for abdominal pain.  Genitourinary: Negative for difficulty urinating and dysuria.  Musculoskeletal: Negative for back pain.  Skin: Negative for rash.  Neurological: Negative for dizziness, speech difficulty, weakness and headaches.  Hematological: Negative for adenopathy. Does not bruise/bleed easily.  Psychiatric/Behavioral: Negative for behavioral problems and confusion. The patient is not nervous/anxious.     Past Medical History:  Diagnosis Date  . Bradycardic cardiac arrest ([place]) 07/24/2016   Required CPR, intubation with multiple rib fractures. Had short term cardiac shock with acute heart failure.  Resulted in possible stress-induced cardiomyopathy  . Cardiac related syncope 07/24/2016   Bradycardic arrest requiring CPR 2 with transvenous pacemaker placed followed by permanent pacemaker; complicated by respiratory arrest, type II MI, stress-induced cardiopathy  . Cardiomyopathy ([place]) 07/2016   Moderate severely reduced EF with apical hypokinesis suggestive of stress-induced cardiac myopathy versus multivessel CAD --> in setting of her to cardiac  arrest  . COPD (chronic obstructive pulmonary disease) ([place])    By report  . History of chronic constipation   . Hypercholesterolemia   . Osteoporosis   . Pacemaker 07/26/2016   Abbott dual-chamber pacemaker: [place] #[id number] - Serial [id number]  . Pneumonia 08/2016  . Sarcoidosis of lung ([place])   . Spinal stenosis      Social History   Socioeconomic History  . Marital status: Widowed    Spouse name: Not on file  . Number of children: Not on file  . Years of education: Not on file  . Highest education level: Not on file  Occupational History  . Not on file  Social Needs  . Financial resource strain: Not on file  . Food insecurity:    Worry: Not on file    Inability: Not on file  . Transportation needs:    Medical: Not on file    Non-medical: Not on file  Tobacco Use  . Smoking status: Former Smoker    Packs/day: 1.50    Years: 17.00    Pack years: 25.50    Types: Cigarettes    Start date: 12/18/1963    Last attempt to quit: 11/20/1983    Years since quitting: 34.8  . Smokeless tobacco: Never Used  Substance and Sexual Activity  . Alcohol use: No  . Drug use: No  . Sexual activity: Not on file  Lifestyle  . Physical activity:    Days per week: Not on file    Minutes per session: Not on file  . Stress: Not on file  Relationships  . Social connections:    Talks on phone: Not on file  Gets together: Not on file    Attends religious service: Not on file    Active member of club or organization: Not on file    Attends meetings of clubs or organizations: Not on file    Relationship status: Not on file  . Intimate partner violence:    Fear of current or ex partner: Not on file    Emotionally abused: Not on file    Physically abused: Not on file    Forced sexual activity: Not on file  Other Topics Concern  . Not on file  Social History Narrative   Recently moved to [place] from [place] to live near her daughter.    Past Surgical  History:  Procedure Laterality Date  . ADENOIDECTOMY    . PACEMAKER INSERTION Left 07/26/2016   Abbott Assurity Model #[id number] - Serial #[id number] -- complicated by pneumothorax requiring chest tube placement  . TEE WITHOUT CARDIOVERSION  02/2013   EF 55-60% with normal global function. No thrombus, mass or vegetation. Trivial-small pericardial effusion. Moderate LA dilation. Mild MR.  . TONSILLECTOMY    . TRANSTHORACIC ECHOCARDIOGRAM  07/24/2016   [facility]: EF 45-50%. Mid and apical inferior, inferolateral and apical hypokinesis. GR 2 DD. Mild LA and moderate RA dilation. Mild paracardial effusion. Moderate to severe TR. Basal and mid segment hypokinesis with apical hypokinesis. - Suspect stress-induced cardiopathy versus multivessel CAD.  Marland Kitchen TRANSTHORACIC ECHOCARDIOGRAM  07/26/2016   [place]: LIMITED (post PPM) - although the report suggested EF 30-35% with apical akinesis, rounding note suggested this was an improvement    Family History  Problem Relation Age of Onset  . Allergic rhinitis Neg Hx   . Angioedema Neg Hx   . Asthma Neg Hx   . Eczema Neg Hx   . Immunodeficiency Neg Hx   . Urticaria Neg Hx     Allergies  Allergen Reactions  . Dust Mite Extract Shortness Of Breath  . Lidex [Fluocinonide] Swelling    Caused lip swelling  . Molds & Smuts Shortness Of Breath  . Lactose   . Levaquin [Levofloxacin In D5w]     Couldn't breath   . Penicillins     Arms swelling   . Rifampin     Couldn't breath     Current Outpatient Medications on File Prior to Visit  Medication Sig Dispense Refill  . acetaminophen (TYLENOL) 500 MG tablet Take 500-1,000 mg by mouth every 6 (six) hours as needed for moderate pain or headache.    . albuterol (PROVENTIL HFA;VENTOLIN HFA) 108 (90 Base) MCG/ACT inhaler Inhale 2 puffs into the lungs every 6 (six) hours as needed for wheezing or shortness of breath. 1 Inhaler 2  . amLODipine (NORVASC) 2.5 MG  tablet Take 1 tablet (2.5 mg total) by mouth daily. 30 tablet 5  . Ascorbic Acid (VITAMIN C) 1000 MG tablet Take 1,000 mg by mouth daily.    Marland Kitchen atorvastatin (LIPITOR) 10 MG tablet Take 1/2 tablet (5mg ) by mouth daily 45 tablet 3  . azithromycin (ZITHROMAX) 250 MG tablet Take 2 tablets by mouth on day 1, followed by 1 tablet by mouth daily for 4 days. 6 tablet 0  . B Complex Vitamins (VITAMIN B COMPLEX) TABS Take by mouth.    . benzonatate (TESSALON) 100 MG capsule Take 1 capsule (100 mg total) by mouth 3 (three) times daily as needed for cough. 30 capsule 0  . Biotin 10000 MCG TABS Take 10,000 mcg by mouth daily.     Marland Kitchen  Calcium Citrate-Vitamin D (CITRACAL + D PO) Take 1 tablet by mouth 2 (two) times daily.    . ciclopirox (PENLAC) 8 % solution Apply topically at bedtime. Apply over nail and surrounding skin. Apply daily over previous coat. After seven (7) days, may remove with alcohol and continue cycle. (Patient taking differently: Apply 1 application topically at bedtime. Apply over nail and surrounding skin. Apply daily over previous coat. After seven (7) days, may remove with alcohol and continue cycle.) 6.6 mL 0  . Coenzyme Q10 (CO Q 10) 100 MG CAPS Take 100 mg by mouth daily.    Marland Kitchen docusate sodium (COLACE) 100 MG capsule Take 100 mg by mouth daily as needed for mild constipation.     . fluticasone (FLONASE) 50 MCG/ACT nasal spray Place 1 spray into both nostrils daily as needed for allergies.     . hydroxypropyl methylcellulose / hypromellose (ISOPTO TEARS / GONIOVISC) 2.5 % ophthalmic solution Place 1 drop into both eyes 3 (three) times daily.     Marland Kitchen ipratropium (ATROVENT HFA) 17 MCG/ACT inhaler Inhale 2 puffs into the lungs every 6 (six) hours as needed for wheezing. 1 Inhaler 2  . Misc Natural Products (GLUCOS-CHONDROIT-MSM COMPLEX) TABS Take 2 tablets by mouth daily.    . Multiple Vitamins-Minerals (MULTIVITAMIN WITH MINERALS) tablet Take 1 tablet by mouth daily.    . mupirocin cream  (BACTROBAN) 2 % Apply 1 application topically 2 (two) times daily. 30 g 0  . mupirocin ointment (BACTROBAN) 2 % Apply 1 application topically 2 (two) times daily as needed. (Patient taking differently: Apply 1 application topically 2 (two) times daily as needed (rash). ) 22 g   . Probiotic CAPS Take 1 capsule by mouth daily.    Marland Kitchen sulfamethoxazole-trimethoprim (BACTRIM DS,SEPTRA DS) 800-160 MG tablet Take 1 tablet by mouth 2 (two) times daily. 14 tablet 0   No current facility-administered medications on file prior to visit.     BP 127/78   Pulse 71   Temp 98.3 F (36.8 C) (Oral)   Resp 16   Ht 4\' 11"  (1.499 m)   Wt 94 lb (42.6 kg)   SpO2 94%   BMI 18.99 kg/m       Objective:   Physical Exam  General  Mental Status - Alert. General Appearance - Well groomed. Not in acute distress.  Skin Rashes- No Rashes.  HEENT Head- Normal. Ear Auditory Canal - Left- Normal. Right - Normal.Tympanic Membrane- Left- Normal. Right- Normal. Eye Sclera/Conjunctiva- Left- Normal. Right- Normal. Nose & Sinuses Nasal Mucosa- Left-  Boggy and Congested. Right-  Boggy and  Congested.Faint rt sidemaxillary but no frontal sinus pressure. Mouth & Throat Lips: Upper Lip- Normal: no dryness, cracking, pallor, cyanosis, or vesicular eruption. Lower Lip-Normal: no dryness, cracking, pallor, cyanosis or vesicular eruption. Buccal Mucosa- Bilateral- No Aphthous ulcers. Oropharynx- No Discharge or Erythema. Tonsils: Characteristics- Bilateral- No Erythema or Congestion. Size/Enlargement- Bilateral- No enlargement. Discharge- bilateral-None.  Neck Neck- Supple. No Masses.   Chest and Lung Exam Auscultation: Breath Sounds:-Clear even and unlabored.  Cardiovascular Auscultation:Rythm- Regular, rate and rhythm. Murmurs & Other Heart Sounds:Ausculatation of the heart reveal- No Murmurs.  Lymphatic Head & Neck General Head & Neck Lymphatics: Bilateral: Description- No Localized  lymphadenopathy.       Assessment & Plan:  You had some signs and symptoms that were concerning for bronchitis versus pneumonia on last visit.  X-ray of chest showed possible pneumonia.  I prescribed benzonatate for cough and a Bactrim antibiotic.  By your  report and exam you do appear to be much improved.  I want you to continue taking the benzonatate for cough as needed and finish the full course of Bactrim.  I am placing future chest x-ray order to get done this coming Wednesday or Thursday.  Which every day works best for your schedule.  If you get any worsening or changing signs or symptoms please let us know.  Follow-up as needed at this point.  You just need to get the x-ray next week I do not necessarily need to see you unless symptoms change or worsen.  Mackie Pai, PA-C

## 2018-09-19 NOTE — Patient Instructions (Signed)
You had some signs and symptoms that were concerning for bronchitis versus pneumonia on last visit.  X-ray of chest showed possible pneumonia.  I prescribed benzonatate for cough and a Bactrim antibiotic.  By your report and exam you do appear to be much improved.  I want you to continue taking the benzonatate for cough as needed and finish the full course of Bactrim.  I am placing future chest x-ray order to get done this coming Wednesday or Thursday.  Which every day works best for your schedule.  If you get any worsening or changing signs or symptoms please let us know.  Follow-up as needed at this point.  You just need to get the x-ray next week I do not necessarily need to see you unless symptoms change or worsen.

## 2018-09-25 ENCOUNTER — Ambulatory Visit (HOSPITAL_BASED_OUTPATIENT_CLINIC_OR_DEPARTMENT_OTHER)
Admission: RE | Admit: 2018-09-25 | Discharge: 2018-09-25 | Disposition: A | Discharge status: Home / Self Care | Payer: Medicare HMO | Source: Ambulatory Visit | Attending: Medical | Admitting: Medical

## 2018-09-25 DIAGNOSIS — J189 Pneumonia, unspecified organism: Secondary | ICD-10-CM | POA: Diagnosis not present

## 2018-09-25 DIAGNOSIS — R05 Cough: Secondary | ICD-10-CM | POA: Insufficient documentation

## 2018-09-25 DIAGNOSIS — R059 Cough, unspecified: Secondary | ICD-10-CM

## 2018-09-25 DIAGNOSIS — K449 Diaphragmatic hernia without obstruction or gangrene: Secondary | ICD-10-CM | POA: Diagnosis not present

## 2018-09-26 ENCOUNTER — Other Ambulatory Visit: Payer: Self-pay | Admitting: Medical

## 2018-09-26 ENCOUNTER — Encounter: Payer: Self-pay | Admitting: Cardiology

## 2018-09-26 MED ORDER — BENZONATATE 100 MG PO CAPS: 100.0000 mg | ORAL_CAPSULE | Freq: Three times a day (TID) | ORAL | 0 refills | Status: DC | PRN

## 2018-09-26 NOTE — Telephone Encounter (Signed)
Copied from [place] [id number]. Topic: Quick Communication - Rx Refill/Question >> Sep 26, 2018 10:37 AM Yvette Rack wrote: Medication: benzonatate (TESSALON) 100 MG capsule  Has the patient contacted their pharmacy? Yes.   (Agent: If no, request that the patient contact the pharmacy for the refill.) (Agent: If yes, when and what did the pharmacy advise?)to call her provider  Preferred Pharmacy (with phone number or street name):   [id number] [address], [place] - [place] [phone] (Phone) [phone] (Fax)    Agent: Please be advised that RX refills may take up to 3 business days. We ask that you follow-up with your pharmacy.

## 2018-09-27 NOTE — Telephone Encounter (Signed)
I sent benzonatate to pharmacy. But did not see any notes regarding how patient is feeling. Did she actually request referral or was this automatic refill request.

## 2018-10-09 DIAGNOSIS — J189 Pneumonia, unspecified organism: Secondary | ICD-10-CM | POA: Diagnosis not present

## 2018-10-09 DIAGNOSIS — I10 Essential (primary) hypertension: Secondary | ICD-10-CM | POA: Diagnosis not present

## 2018-10-10 ENCOUNTER — Other Ambulatory Visit: Payer: Self-pay | Admitting: Medical

## 2018-10-13 ENCOUNTER — Ambulatory Visit: Payer: Self-pay

## 2018-10-13 NOTE — Telephone Encounter (Signed)
Returned call to pt.  Reported she was seen at [facility] last Thurs., and was diagnosed with pneumonia.  Reported she was given Rx for Z-pak and will take her last dose today.  Is requesting an additional 3 days of antibiotic.  In asking assessment questions, pt. was very curt with her responses.  Denied fever.  Reported coughing up green phlegm, and shortness of breath with activity.  Denied chest discomfort.  Stated "I don't feel good."  Advised she would need an appt., since she was initially evaluated by UC, and not her PCP.  Stated "I don't want to go out, with the way I feel.  I don't understand why I would need an appt.  All I want is the antibiotic!"   Advised will send a message to Mackie Pai, and a nurse will call her back with further recommendations.  Pt. Agreed with plan.    Reason for Disposition . [1] Taking antibiotic > 48 hours (2 days) for pneumonia AND [2] breathing not improved    Reported starting Zpak on Thurs. 11/21, and will finish it today.  Reported coughing up green phlegm, and shortness of breath with activity.  Refused to schedule an office appt.  Is requesting 3 add'l. days of antibiotic.  Answer Assessment - Initial Assessment Questions 1. SYMPTOMS: "What symptoms are you most concerned about?" "Is it better, the same, or worse compared to when you saw the doctor?"     Coughing up green phlegm; doesn't feel good   2. BREATHING DIFFICULTY: "Are you having any difficulty breathing?" If so, ask "How bad is it?"  (e.g., none, mild, moderate, severe)   - MILD: No SOB at rest, mild SOB with walking, speaks normally in sentences, can lay down, no retractions, pulse < 100.    - MODERATE: SOB at rest, SOB with minimal exertion and prefers to sit, cannot lie down flat, speaks in phrases, mild retractions, audible wheezing, pulse 100-120.    - SEVERE: Very SOB at rest, speaks in single words, struggling to breathe, sitting hunched forward, retractions, pulse > 120      Mild  shortness of breath with activity  3. FEVER: "Do you have a fever?" If so, ask: "What is your temperature, how was it measured, and when did it start?"     Denied  4. SPUTUM: "Describe the color of your sputum" (clear, white, yellow, green, blood-tinged)     Green phlegm; 5. DIAGNOSIS CONFIRMATION: "When was the pneumonia diagnosed?" "By whom?"     yes 6. ANTIBIOTIC: "Are you taking an antibiotic?"  If so, "Which one?" "When was it started?"     Z pak ; to be finished today  7. OTHER TREATMENT: "Are you receiving any other treatment for the pneumonia?" (e.g., albuterol nebulizer, oxygen) If so, ask, "How often?" and "Do they help?"     No nebulizer ordered  8. HOSPITAL ADMISSION: "Were you hospitalized for this pneumonia?" If so, ask "When were you discharged home from the hospital?"    No  Protocols used: PNEUMONIA ON ANTIBIOTIC FOLLOW-UP CALL-A-AH  Message from Rayann Heman sent at 10/13/2018 8:50 AM EST   Summary: medication request    Pt called and stated that [facility]. Pt states they gave her a zpack for 5 days. Pt would like to talk to a nurse to have 3 more days sent in. Please advise

## 2018-10-14 ENCOUNTER — Ambulatory Visit (INDEPENDENT_AMBULATORY_CARE_PROVIDER_SITE_OTHER): Payer: Medicare HMO | Admitting: Medical

## 2018-10-14 ENCOUNTER — Encounter: Payer: Self-pay | Admitting: Medical

## 2018-10-14 ENCOUNTER — Ambulatory Visit (HOSPITAL_BASED_OUTPATIENT_CLINIC_OR_DEPARTMENT_OTHER)
Admission: RE | Admit: 2018-10-14 | Discharge: 2018-10-14 | Disposition: A | Discharge status: Home / Self Care | Payer: Medicare HMO | Source: Ambulatory Visit | Attending: Medical | Admitting: Medical

## 2018-10-14 VITALS — BP 121/73 | HR 95 | Temp 98.0°F | Resp 16 | Ht 59.0 in | Wt 94.6 lb

## 2018-10-14 DIAGNOSIS — R05 Cough: Secondary | ICD-10-CM | POA: Diagnosis not present

## 2018-10-14 DIAGNOSIS — R059 Cough, unspecified: Secondary | ICD-10-CM

## 2018-10-14 DIAGNOSIS — J189 Pneumonia, unspecified organism: Secondary | ICD-10-CM

## 2018-10-14 MED ORDER — BENZONATATE 100 MG PO CAPS: 100.0000 mg | ORAL_CAPSULE | Freq: Three times a day (TID) | ORAL | 0 refills | Status: DC | PRN

## 2018-10-14 MED ORDER — AZITHROMYCIN 250 MG PO TABS: ORAL_TABLET | ORAL | 0 refills | Status: DC

## 2018-10-14 NOTE — Telephone Encounter (Addendum)
Approaching holiday and seen by other clinic. She need different antibiotic but if dx with pneumonia and not better needs chest xray. Important for any patient. Particularly for her since she is 84 years old. Recommend she be seen today or tomorrow with someone. Sorry this is not answer she wants but best advisement under circumstances.  Let me know what she says

## 2018-10-14 NOTE — Patient Instructions (Addendum)
You were diagnosed with recent pneumonia last week.  He still has some productive cough and fatigue.  Sometimes symptoms can persist for weeks past treatment for pneumonia.  Your lungs do sound overall clear today but mildly rough when you coughed up.  Want to get chest x-ray today stat and have you come up until I can review radiology result report.  Choice antibiotic would be dependent on x-ray result.  You can continue benzonatate for cough.  Try to get adequate rest.  Follow-up in 7 days or as needed.  We are approaching Thanksgiving and if your symptoms/signs worsen despite treatment given today then recommend ED evaluation.   After xray review it did show no pneumonia. But pt still has lingering and will treat for bronchitis. Rx refill of azithromycin and benzonatate. Also in her age concern for pneumonia not showing in event of dehydration.

## 2018-10-14 NOTE — Progress Notes (Signed)
Subjective:    Patient ID: Tracey Holt, female    DOB: [birth year], 84 y.o.   MRN: [id number]  HPI  Pt in for evaluation.  [facility] and she had [facility]ray. They said she had pneumonia and was given azithromycin. Pt states xray was overead by radiologist that agreed she had pneumonia.  Pt does not report to me any wheeze, chest pain or sob.  Still has fatigue and intermittent produtcitve cough.  Pt has allergy to pcn and levofloxin. In past I have given her azithromycin and bactrim in past with no side effect.  Pt has vague recollection of possible side effect with doxy. But no sure.   Review of Systems  Constitutional: Positive for fatigue. Negative for chills and fever.       Still fatigued since dx with pneumonia  Respiratory: Positive for cough. Negative for chest tightness, shortness of breath and wheezing.        States not coughing much but when does is deep cough and brings up mucous.  Cardiovascular: Negative for chest pain and palpitations.  Gastrointestinal: Negative for abdominal pain.  Musculoskeletal: Negative for back pain.  Skin: Negative for rash.  Neurological: Negative for dizziness, syncope, weakness, numbness and headaches.  Hematological: Negative for adenopathy. Does not bruise/bleed easily.  Psychiatric/Behavioral: Negative for behavioral problems and confusion.    Past Medical History:  Diagnosis Date  . Bradycardic cardiac arrest ([place]) 07/24/2016   Required CPR, intubation with multiple rib fractures. Had short term cardiac shock with acute heart failure.  Resulted in possible stress-induced cardiomyopathy  . Cardiac related syncope 07/24/2016   Bradycardic arrest requiring CPR 2 with transvenous pacemaker placed followed by permanent pacemaker; complicated by respiratory arrest, type II MI, stress-induced cardiopathy  . Cardiomyopathy ([place]) 07/2016   Moderate severely reduced EF with apical hypokinesis suggestive of stress-induced  cardiac myopathy versus multivessel CAD --> in setting of her to cardiac arrest  . COPD (chronic obstructive pulmonary disease) ([place])    By report  . History of chronic constipation   . Hypercholesterolemia   . Osteoporosis   . Pacemaker 07/26/2016   Abbott dual-chamber pacemaker: [employer] #[id number] - Serial [id number]  . Pneumonia 08/2016  . Sarcoidosis of lung ([place])   . Spinal stenosis      Social History   Socioeconomic History  . Marital status: Widowed    Spouse name: Not on file  . Number of children: Not on file  . Years of education: Not on file  . Highest education level: Not on file  Occupational History  . Not on file  Social Needs  . Financial resource strain: Not on file  . Food insecurity:    Worry: Not on file    Inability: Not on file  . Transportation needs:    Medical: Not on file    Non-medical: Not on file  Tobacco Use  . Smoking status: Former Smoker    Packs/day: 1.50    Years: 17.00    Pack years: 25.50    Types: Cigarettes    Start date: 12/18/1963    Last attempt to quit: 11/20/1983    Years since quitting: 34.9  . Smokeless tobacco: Never Used  Substance and Sexual Activity  . Alcohol use: No  . Drug use: No  . Sexual activity: Not on file  Lifestyle  . Physical activity:    Days per week: Not on file    Minutes per session: Not on file  .  Stress: Not on file  Relationships  . Social connections:    Talks on phone: Not on file    Gets together: Not on file    Attends religious service: Not on file    Active member of club or organization: Not on file    Attends meetings of clubs or organizations: Not on file    Relationship status: Not on file  . Intimate partner violence:    Fear of current or ex partner: Not on file    Emotionally abused: Not on file    Physically abused: Not on file    Forced sexual activity: Not on file  Other Topics Concern  . Not on file  Social History Narrative   Recently moved to [place]  from [place] to live near her daughter.    Past Surgical History:  Procedure Laterality Date  . ADENOIDECTOMY    . PACEMAKER INSERTION Left 07/26/2016   Abbott Assurity Model #[id number] - Serial #[id number] -- complicated by pneumothorax requiring chest tube placement  . TEE WITHOUT CARDIOVERSION  02/2013   EF 55-60% with normal global function. No thrombus, mass or vegetation. Trivial-small pericardial effusion. Moderate LA dilation. Mild MR.  . TONSILLECTOMY    . TRANSTHORACIC ECHOCARDIOGRAM  07/24/2016   [facility]: EF 45-50%. Mid and apical inferior, inferolateral and apical hypokinesis. GR 2 DD. Mild LA and moderate RA dilation. Mild paracardial effusion. Moderate to severe TR. Basal and mid segment hypokinesis with apical hypokinesis. - Suspect stress-induced cardiopathy versus multivessel CAD.  Marland Kitchen TRANSTHORACIC ECHOCARDIOGRAM  07/26/2016   [facility]: LIMITED (post PPM) - although the report suggested EF 30-35% with apical akinesis, rounding note suggested this was an improvement    Family History  Problem Relation Age of Onset  . Allergic rhinitis Neg Hx   . Angioedema Neg Hx   . Asthma Neg Hx   . Eczema Neg Hx   . Immunodeficiency Neg Hx   . Urticaria Neg Hx     Allergies  Allergen Reactions  . Dust Mite Extract Shortness Of Breath  . Lidex [Fluocinonide] Swelling    Caused lip swelling  . Molds & Smuts Shortness Of Breath  . Lactose   . Levaquin [Levofloxacin In D5w]     Couldn't breath   . Penicillins     Arms swelling   . Rifampin     Couldn't breath     Current Outpatient Medications on File Prior to Visit  Medication Sig Dispense Refill  . acetaminophen (TYLENOL) 500 MG tablet Take 500-1,000 mg by mouth every 6 (six) hours as needed for moderate pain or headache.    . albuterol (PROVENTIL HFA;VENTOLIN HFA) 108 (90 Base) MCG/ACT inhaler Inhale 2 puffs into the lungs every 6 (six) hours as needed for wheezing  or shortness of breath. 1 Inhaler 2  . amLODipine (NORVASC) 2.5 MG tablet Take 1 tablet (2.5 mg total) by mouth daily. 30 tablet 5  . Ascorbic Acid (VITAMIN C) 1000 MG tablet Take 1,000 mg by mouth daily.    Marland Kitchen atorvastatin (LIPITOR) 10 MG tablet Take 1/2 tablet (5mg ) by mouth daily 45 tablet 3  . azithromycin (ZITHROMAX) 250 MG tablet Take 2 tablets by mouth on day 1, followed by 1 tablet by mouth daily for 4 days. 6 tablet 0  . B Complex Vitamins (VITAMIN B COMPLEX) TABS Take by mouth.    . benzonatate (TESSALON) 100 MG capsule TAKE 1 CAPSULE BY MOUTH THREE TIMES A DAY AS NEEDED  FOR COUGH 30 capsule 0  . benzonatate (TESSALON) 100 MG capsule Take 1 capsule (100 mg total) by mouth 3 (three) times daily as needed for cough. 30 capsule 0  . Biotin 10000 MCG TABS Take 10,000 mcg by mouth daily.     . Calcium Citrate-Vitamin D (CITRACAL + D PO) Take 1 tablet by mouth 2 (two) times daily.    . ciclopirox (PENLAC) 8 % solution Apply topically at bedtime. Apply over nail and surrounding skin. Apply daily over previous coat. After seven (7) days, may remove with alcohol and continue cycle. (Patient taking differently: Apply 1 application topically at bedtime. Apply over nail and surrounding skin. Apply daily over previous coat. After seven (7) days, may remove with alcohol and continue cycle.) 6.6 mL 0  . Coenzyme Q10 (CO Q 10) 100 MG CAPS Take 100 mg by mouth daily.    Marland Kitchen docusate sodium (COLACE) 100 MG capsule Take 100 mg by mouth daily as needed for mild constipation.     . fluticasone (FLONASE) 50 MCG/ACT nasal spray Place 1 spray into both nostrils daily as needed for allergies.     . hydroxypropyl methylcellulose / hypromellose (ISOPTO TEARS / GONIOVISC) 2.5 % ophthalmic solution Place 1 drop into both eyes 3 (three) times daily.     Marland Kitchen ipratropium (ATROVENT HFA) 17 MCG/ACT inhaler Inhale 2 puffs into the lungs every 6 (six) hours as needed for wheezing. 1 Inhaler 2  . Misc Natural Products  (GLUCOS-CHONDROIT-MSM COMPLEX) TABS Take 2 tablets by mouth daily.    . Multiple Vitamins-Minerals (MULTIVITAMIN WITH MINERALS) tablet Take 1 tablet by mouth daily.    . mupirocin cream (BACTROBAN) 2 % Apply 1 application topically 2 (two) times daily. 30 g 0  . mupirocin ointment (BACTROBAN) 2 % Apply 1 application topically 2 (two) times daily as needed. (Patient taking differently: Apply 1 application topically 2 (two) times daily as needed (rash). ) 22 g   . Probiotic CAPS Take 1 capsule by mouth daily.    Marland Kitchen sulfamethoxazole-trimethoprim (BACTRIM DS,SEPTRA DS) 800-160 MG tablet Take 1 tablet by mouth 2 (two) times daily. 14 tablet 0   No current facility-administered medications on file prior to visit.     BP 121/73   Pulse 95   Temp 98 F (36.7 C) (Oral)   Resp 16   Ht 4\' 11"  (1.499 m)   Wt 94 lb 9.6 oz (42.9 kg)   SpO2 95%   BMI 19.11 kg/m       Objective:   Physical Exam  General  Mental Status - Alert. General Appearance - Well groomed. Not in acute distress.  Skin Rashes- No Rashes.  HEENT Head- Normal. Ear Auditory Canal - Left- Normal. Right - Normal.Tympanic Membrane- Left- Normal. Right- Normal. Eye Sclera/Conjunctiva- Left- Normal. Right- Normal. Nose & Sinuses Nasal Mucosa- Left-  Not boggy or Congested. Right-  Not  boggy or Congested. No sinus pressure. Mouth & Throat Lips: Upper Lip- Normal: no dryness, cracking, pallor, cyanosis, or vesicular eruption. Lower Lip-Normal: no dryness, cracking, pallor, cyanosis or vesicular eruption. Buccal Mucosa- Bilateral- No Aphthous ulcers. Oropharynx- No Discharge or Erythema. Tonsils: Characteristics- Bilateral- No Erythema or Congestion. Size/Enlargement- Bilateral- No enlargement. Discharge- bilateral-None.  Neck Neck- Supple. No Masses.   Chest and Lung Exam Auscultation: Breath Sounds:- even and unlabored. Clear but when coughs sounds faint rough upper lobes.  Cardiovascular Auscultation:Rythm- Regular,  rate and rhythm. Murmurs & Other Heart Sounds:Ausculatation of the heart reveal- No Murmurs.  Lymphatic Head &  Neck General Head & Neck Lymphatics: Bilateral: Description- No Localized lymphadenopathy.       Assessment & Plan:  You were diagnosed with recent pneumonia last week.  He still has some productive cough and fatigue.  Sometimes symptoms can persist for weeks past treatment for pneumonia.  Your lungs do sound overall clear today but mildly rough when you coughed up.  Want to get chest x-ray today stat and have you come up until I can review radiology result report.  Choice antibiotic would be dependent on x-ray result.  You can continue benzonatate for cough.  Try to get adequate rest.  Follow-up in 7 days or as needed.  We are approaching Thanksgiving and if your symptoms/signs worsen despite treatment given today then recommend ED evaluation.

## 2018-10-14 NOTE — Telephone Encounter (Signed)
Pt has appointment today.  

## 2018-10-24 DIAGNOSIS — I10 Essential (primary) hypertension: Secondary | ICD-10-CM | POA: Diagnosis not present

## 2018-10-24 DIAGNOSIS — J189 Pneumonia, unspecified organism: Secondary | ICD-10-CM | POA: Diagnosis not present

## 2018-10-25 ENCOUNTER — Encounter (HOSPITAL_BASED_OUTPATIENT_CLINIC_OR_DEPARTMENT_OTHER): Payer: Self-pay | Admitting: Adult Health

## 2018-10-25 ENCOUNTER — Other Ambulatory Visit: Payer: Self-pay

## 2018-10-25 ENCOUNTER — Emergency Department (HOSPITAL_BASED_OUTPATIENT_CLINIC_OR_DEPARTMENT_OTHER): Payer: Medicare HMO

## 2018-10-25 ENCOUNTER — Emergency Department (HOSPITAL_BASED_OUTPATIENT_CLINIC_OR_DEPARTMENT_OTHER)
Admission: EM | Admit: 2018-10-25 | Discharge: 2018-10-25 | Disposition: A | Discharge status: Home / Self Care | Payer: Medicare HMO | Attending: Emergency Medicine | Admitting: Emergency Medicine

## 2018-10-25 DIAGNOSIS — I5022 Chronic systolic (congestive) heart failure: Secondary | ICD-10-CM | POA: Insufficient documentation

## 2018-10-25 DIAGNOSIS — J449 Chronic obstructive pulmonary disease, unspecified: Secondary | ICD-10-CM | POA: Diagnosis not present

## 2018-10-25 DIAGNOSIS — Z79899 Other long term (current) drug therapy: Secondary | ICD-10-CM | POA: Insufficient documentation

## 2018-10-25 DIAGNOSIS — R0602 Shortness of breath: Secondary | ICD-10-CM | POA: Diagnosis not present

## 2018-10-25 DIAGNOSIS — Z87891 Personal history of nicotine dependence: Secondary | ICD-10-CM | POA: Diagnosis not present

## 2018-10-25 DIAGNOSIS — Z950 Presence of cardiac pacemaker: Secondary | ICD-10-CM | POA: Diagnosis not present

## 2018-10-25 DIAGNOSIS — J40 Bronchitis, not specified as acute or chronic: Secondary | ICD-10-CM

## 2018-10-25 LAB — CBC WITH DIFFERENTIAL/PLATELET
ABS IMMATURE GRANULOCYTES: 0.01 10*3/uL (ref 0.00–0.07)
Basophils Absolute: 0 10*3/uL (ref 0.0–0.1)
Basophils Relative: 1 %
Eosinophils Absolute: 0.2 10*3/uL (ref 0.0–0.5)
Eosinophils Relative: 4 %
HEMATOCRIT: 40.6 % (ref 36.0–46.0)
Hemoglobin: 12.8 g/dL (ref 12.0–15.0)
Immature Granulocytes: 0 %
Lymphocytes Relative: 21 %
Lymphs Abs: 0.9 10*3/uL (ref 0.7–4.0)
MCH: 29 pg (ref 26.0–34.0)
MCHC: 31.5 g/dL (ref 30.0–36.0)
MCV: 91.9 fL (ref 80.0–100.0)
Monocytes Absolute: 0.5 10*3/uL (ref 0.1–1.0)
Monocytes Relative: 13 %
Neutro Abs: 2.7 10*3/uL (ref 1.7–7.7)
Neutrophils Relative %: 61 %
Platelets: 197 10*3/uL (ref 150–400)
RBC: 4.42 MIL/uL (ref 3.87–5.11)
RDW: 14.6 % (ref 11.5–15.5)
WBC: 4.3 10*3/uL (ref 4.0–10.5)
nRBC: 0 % (ref 0.0–0.2)

## 2018-10-25 LAB — BASIC METABOLIC PANEL
Anion gap: 7 (ref 5–15)
BUN: 16 mg/dL (ref 8–23)
CO2: 25 mmol/L (ref 22–32)
Calcium: 8.9 mg/dL (ref 8.9–10.3)
Chloride: 103 mmol/L (ref 98–111)
Creatinine, Ser: 0.52 mg/dL (ref 0.44–1.00)
GFR calc Af Amer: >60 mL/min (ref >60)
GFR calc non Af Amer: >60 mL/min (ref >60)
GLUCOSE: 126 mg/dL — AB (ref 70–99)
Potassium: 3.4 mmol/L — ABNORMAL LOW (ref 3.5–5.1)
Sodium: 135 mmol/L (ref 135–145)

## 2018-10-25 LAB — BRAIN NATRIURETIC PEPTIDE: B Natriuretic Peptide: 94.6 pg/mL (ref 0.0–100.0)

## 2018-10-25 LAB — TROPONIN I: Troponin I: <0.03 ng/mL (ref <0.03)

## 2018-10-25 MED ORDER — IPRATROPIUM-ALBUTEROL 0.5-2.5 (3) MG/3ML IN SOLN
3.0000 mL | Freq: Four times a day (QID) | RESPIRATORY_TRACT | Status: DC
  Filled 2018-10-25: qty 3

## 2018-10-25 MED ORDER — IPRATROPIUM-ALBUTEROL 0.5-2.5 (3) MG/3ML IN SOLN
3.0000 mL | Freq: Once | RESPIRATORY_TRACT | Status: AC
  Administered 2018-10-25: 3 mL via RESPIRATORY_TRACT

## 2018-10-25 MED ORDER — AZITHROMYCIN 250 MG PO TABS: 250.0000 mg | ORAL_TABLET | Freq: Every day | ORAL | 0 refills | Status: DC

## 2018-10-25 NOTE — ED Triage Notes (Signed)
Pt states, "I have pneumonia" She is difficult to get information from but reports that she was recently treated and it has come back. Sh denies fever, diarrhea and cough. Endorses fatigue.

## 2018-10-25 NOTE — ED Notes (Signed)
X-ray- Attempted to get pt. RN was drawing labs and starting breathing tx.

## 2018-10-25 NOTE — ED Provider Notes (Signed)
[facility] EMERGENCY DEPARTMENT Provider Note   CSN: [id number] Arrival date & time: 10/25/18  1443     History   Chief Complaint Chief Complaint  Patient presents with  . Shortness of Breath    HPI Tracey Holt is a 84 y.o. female.  She presents to the emergency department complaining that her pneumonia has recurred.  It sounds like she was treated by her primary over a week ago for cough with antibiotics but she said she feels like it is getting worse.  She has been coughing bringing up a little bit yellow sputum.  She feels weak and rundown.  No fevers but she states she feels cold all the time.  On review of her prior medical records it sounds like she has been on and off pulmonary symptoms for a few months.  She denies any vomiting or diarrhea.  No chest pain.  The history is provided by the patient.  Shortness of Breath  This is a recurrent problem. The problem occurs frequently.The problem has been gradually worsening. Associated symptoms include cough and sputum production. Pertinent negatives include no fever, no headaches, no rhinorrhea, no sore throat, no neck pain, no hemoptysis, no wheezing, no chest pain, no syncope, no vomiting, no abdominal pain, no rash, no leg pain and no leg swelling. It is unknown what precipitated the problem. The treatment provided no relief. She has had prior hospitalizations. She has had prior ED visits.    Past Medical History:  Diagnosis Date  . Bradycardic cardiac arrest ([place]) 07/24/2016   Required CPR, intubation with multiple rib fractures. Had short term cardiac shock with acute heart failure.  Resulted in possible stress-induced cardiomyopathy  . Cardiac related syncope 07/24/2016   Bradycardic arrest requiring CPR 2 with transvenous pacemaker placed followed by permanent pacemaker; complicated by respiratory arrest, type II MI, stress-induced cardiopathy  . Cardiomyopathy ([place]) 07/2016   Moderate severely reduced EF with  apical hypokinesis suggestive of stress-induced cardiac myopathy versus multivessel CAD --> in setting of her to cardiac arrest  . COPD (chronic obstructive pulmonary disease) ([place])    By report  . History of chronic constipation   . Hypercholesterolemia   . Osteoporosis   . Pacemaker 07/26/2016   Abbott dual-chamber pacemaker: [place] #[id number] - Serial [id number]  . Pneumonia 08/2016  . Sarcoidosis of lung ([place])   . Spinal stenosis     Patient Active Problem List   Diagnosis Date Noted  . Disorientation   . Hyponatremia syndrome 08/13/2018  . Acute metabolic encephalopathy 76/19/5093  . Aspiration pneumonia of both lower lobes due to gastric secretions ([place]) 08/13/2018  . Intractable headache 04/07/2018  . Abnormal CT of the abdomen 04/07/2018  . CHB (complete heart block) ([place]) 12/20/2016  . Chronic systolic congestive heart failure ([place]) 12/20/2016  . Angioedema 12/17/2016  . Chronic rhinitis 12/17/2016  . Dyspnea 11/09/2016  . Upper airway cough syndrome 10/29/2016  . Abnormal CT of the chest  c/w lipoid pna 10/29/2016  . Toe ulcer, right ([place]) 10/17/2016  . S/P placement of cardiac pacemaker 09/19/2016  . History of syncope 09/19/2016  . Bradycardic cardiac arrest ([place]) 07/24/2016    Past Surgical History:  Procedure Laterality Date  . ADENOIDECTOMY    . PACEMAKER INSERTION Left 07/26/2016   Abbott Assurity Model #[id number] - Serial #[id number] -- complicated by pneumothorax requiring chest tube placement  . TEE WITHOUT CARDIOVERSION  02/2013   EF 55-60% with normal global function. No thrombus, mass or vegetation. Trivial-small  pericardial effusion. Moderate LA dilation. Mild MR.  . TONSILLECTOMY    . TRANSTHORACIC ECHOCARDIOGRAM  07/24/2016   [facility]: EF 45-50%. Mid and apical inferior, inferolateral and apical hypokinesis. GR 2 DD. Mild LA and moderate RA dilation. Mild paracardial effusion. Moderate to severe TR. Basal and mid  segment hypokinesis with apical hypokinesis. - Suspect stress-induced cardiopathy versus multivessel CAD.  [facility] Kitchen TRANSTHORACIC ECHOCARDIOGRAM  07/26/2016   [facility]: LIMITED (post PPM) - although the report suggested EF 30-35% with apical akinesis, rounding note suggested this was an improvement     OB History   None      Home Medications    Prior to Admission medications   Medication Sig Start Date End Date Taking? Authorizing Provider  acetaminophen (TYLENOL) 500 MG tablet Take 500-1,000 mg by mouth every 6 (six) hours as needed for moderate pain or headache.    [provider]  albuterol (PROVENTIL HFA;VENTOLIN HFA) 108 (90 Base) MCG/ACT inhaler Inhale 2 puffs into the lungs every 6 (six) hours as needed for wheezing or shortness of breath. 09/12/18   Saguier, Percell Miller, PA-C  amLODipine (NORVASC) 2.5 MG tablet Take 1 tablet (2.5 mg total) by mouth daily. 05/16/18   Saguier, Percell Miller, PA-C  Ascorbic Acid (VITAMIN C) 1000 MG tablet Take 1,000 mg by mouth daily.    [provider]  atorvastatin (LIPITOR) 10 MG tablet Take 1/2 tablet (5mg ) by mouth daily 03/08/17   Copland, Gay Filler, MD  azithromycin (ZITHROMAX) 250 MG tablet Take 2 tablets by mouth on day 1, followed by 1 tablet by mouth daily for 4 days. 10/14/18   Saguier, Percell Miller, PA-C  B Complex Vitamins (VITAMIN B COMPLEX) TABS Take by mouth.    [provider]  benzonatate (TESSALON) 100 MG capsule Take 1 capsule (100 mg total) by mouth 3 (three) times daily as needed for cough. 10/14/18   Saguier, Percell Miller, PA-C  Biotin 10000 MCG TABS Take 10,000 mcg by mouth daily.     [provider]  Calcium Citrate-Vitamin D (CITRACAL + D PO) Take 1 tablet by mouth 2 (two) times daily.    [provider]  ciclopirox (PENLAC) 8 % solution Apply topically at bedtime. Apply over nail and surrounding skin. Apply daily over previous coat. After seven (7) days, may remove with alcohol and  continue cycle. Patient taking differently: Apply 1 application topically at bedtime. Apply over nail and surrounding skin. Apply daily over previous coat. After seven (7) days, may remove with alcohol and continue cycle. 12/04/17   Saguier, Percell Miller, PA-C  Coenzyme Q10 (CO Q 10) 100 MG CAPS Take 100 mg by mouth daily.    [provider]  docusate sodium (COLACE) 100 MG capsule Take 100 mg by mouth daily as needed for mild constipation.     [provider]  fluticasone (FLONASE) 50 MCG/ACT nasal spray Place 1 spray into both nostrils daily as needed for allergies.     [provider]  hydroxypropyl methylcellulose / hypromellose (ISOPTO TEARS / GONIOVISC) 2.5 % ophthalmic solution Place 1 drop into both eyes 3 (three) times daily.     [provider]  ipratropium (ATROVENT HFA) 17 MCG/ACT inhaler Inhale 2 puffs into the lungs every 6 (six) hours as needed for wheezing. 09/12/18   Saguier, Percell Miller, PA-C  Misc Natural Products (GLUCOS-CHONDROIT-MSM COMPLEX) TABS Take 2 tablets by mouth daily.    [provider]  Multiple Vitamins-Minerals (MULTIVITAMIN WITH MINERALS) tablet Take 1 tablet by mouth  daily.    [provider]  mupirocin cream (BACTROBAN) 2 % Apply 1 application topically 2 (two) times daily. 04/16/18   Saguier, Percell Miller, PA-C  mupirocin ointment (BACTROBAN) 2 % Apply 1 application topically 2 (two) times daily as needed. Patient taking differently: Apply 1 application topically 2 (two) times daily as needed (rash).  05/26/18   Saguier, Percell Miller, PA-C  Probiotic CAPS Take 1 capsule by mouth daily.    [provider]    Family History Family History  Problem Relation Age of Onset  . Allergic rhinitis Neg Hx   . Angioedema Neg Hx   . Asthma Neg Hx   . Eczema Neg Hx   . Immunodeficiency Neg Hx   . Urticaria Neg Hx     Social History Social History   Tobacco Use  . Smoking status: Former Smoker    Packs/day: 1.50    Years:  17.00    Pack years: 25.50    Types: Cigarettes    Start date: 12/18/1963    Last attempt to quit: 11/20/1983    Years since quitting: 34.9  . Smokeless tobacco: Never Used  Substance Use Topics  . Alcohol use: No  . Drug use: No     Allergies   Dust mite extract; Lidex [fluocinonide]; Molds & smuts; Lactose; Levaquin [levofloxacin in d5w]; Penicillins; and Rifampin   Review of Systems Review of Systems  Constitutional: Negative for fever.  HENT: Negative for rhinorrhea and sore throat.   Eyes: Negative for visual disturbance.  Respiratory: Positive for cough, sputum production and shortness of breath. Negative for hemoptysis and wheezing.   Cardiovascular: Negative for chest pain, leg swelling and syncope.  Gastrointestinal: Negative for abdominal pain and vomiting.  Genitourinary: Negative for dysuria.  Musculoskeletal: Negative for neck pain.  Skin: Negative for rash.  Neurological: Negative for headaches.     Physical Exam Updated Vital Signs BP 135/71 (BP Location: Left Arm)   Pulse 82   Temp 98.1 F (36.7 C) (Oral)   Resp 20   Ht 4\' 11"  (1.499 m)   Wt 42 kg   SpO2 93%   BMI 18.70 kg/m   Physical Exam  Constitutional: She appears well-developed and well-nourished. No distress.  HENT:  Head: Normocephalic and atraumatic.  Mouth/Throat: No oropharyngeal exudate.  Eyes: Conjunctivae are normal.  Neck: Neck supple.  Cardiovascular: Normal rate and regular rhythm.  No murmur heard. Pulmonary/Chest: Effort normal and breath sounds normal. No respiratory distress.  Abdominal: Soft. There is no tenderness.  Musculoskeletal: She exhibits no edema.       Right lower leg: She exhibits no tenderness and no edema.       Left lower leg: She exhibits no tenderness and no edema.  Neurological: She is alert.  Skin: Skin is warm and dry. Capillary refill takes less than 2 seconds.  Psychiatric: She has a normal mood and affect.  Nursing note and vitals  reviewed.    ED Treatments / Results  Labs (all labs ordered are listed, but only abnormal results are displayed) Labs Reviewed  BASIC METABOLIC PANEL - Abnormal; Notable for the following components:      Result Value   Potassium 3.4 (*)    Glucose, Bld 126 (*)    All other components within normal limits  CBC WITH DIFFERENTIAL/PLATELET  BRAIN NATRIURETIC PEPTIDE  TROPONIN I    EKG EKG Interpretation  Date/Time:  Saturday October 25 2018 09:39:56 EST Ventricular Rate:  78 PR Interval:    QRS  Duration: 144 QT Interval:  415 QTC Calculation: 473 R Axis:   -84 Text Interpretation:  Atrial-sensed ventricular-paced rhythm No further analysis attempted due to paced rhythm similar to prior 9/19 Confirmed by Aletta Edouard [phone]) on 10/25/2018 9:44:14 AM   Radiology No results found.  Procedures Procedures (including critical care time)  Medications Ordered in ED Medications  ipratropium-albuterol (DUONEB) 0.5-2.5 (3) MG/3ML nebulizer solution 3 mL (has no administration in time range)     Initial Impression / Assessment and Plan / ED Course  I have reviewed the triage vital signs and the nursing notes.  Pertinent labs & imaging results that were available during my care of the patient were reviewed by me and considered in my medical decision making (see chart for details).  Clinical Course as of Oct 26 1707  Sat Oct 25, 2018  1054 Patient refused to get an x-ray.  She said she is had some any x-rays recently and she does not want to have another one.  She would just like me to start her on some antibiotics.  She says she usually gets a Z-Pak and that helps her symptoms.   [MB]    Clinical Course User Index [MB] Hayden Rasmussen, MD     Final Clinical Impressions(s) / ED Diagnoses   Final diagnoses:  Bronchitis    ED Discharge Orders         Ordered    azithromycin (ZITHROMAX) 250 MG tablet  Daily     10/25/18 1055           Hayden Rasmussen,  MD 10/25/18 1708

## 2018-10-25 NOTE — ED Notes (Signed)
Pt verbalized understanding of dc instructions.

## 2018-10-25 NOTE — Discharge Instructions (Addendum)
You were evaluated in the emergency department for continued cough.  Your blood work was unremarkable and you refused to get another chest x-ray.  We are placing you back on Zithromax in case there is a bacterial cause of your symptoms.  It will be important for you to follow-up with your primary care doctor as soon as possible.

## 2018-10-27 ENCOUNTER — Telehealth: Payer: Self-pay

## 2018-10-27 ENCOUNTER — Other Ambulatory Visit: Payer: Self-pay | Admitting: Medical

## 2018-10-27 NOTE — Telephone Encounter (Signed)
In October I did prescribe her  albuterol inhaler. She can use that for wheezing(1-2 inhalations every 6 hours if needed for wheezing.

## 2018-10-27 NOTE — Telephone Encounter (Signed)
Copied from [place] [id number]. Topic: Quick Communication - Rx Refill/Question >> Oct 27, 2018 11:43 AM Scherrie Gerlach wrote: Medication: albuterol (PROVENTIL HFA;VENTOLIN HFA) 108 (90 Base) MCG/ACT inhaler Pt states the dr at the ED advised her to get an inhaler.  Pt wants to know if the inhaler she has is ok to use, or does she need something else?

## 2018-10-28 ENCOUNTER — Inpatient Hospital Stay: Payer: Medicare HMO | Admitting: Medical

## 2018-10-29 ENCOUNTER — Telehealth: Payer: Self-pay | Admitting: Medical

## 2018-10-29 ENCOUNTER — Inpatient Hospital Stay: Payer: Medicare HMO | Admitting: Medical

## 2018-10-29 ENCOUNTER — Telehealth: Payer: Self-pay

## 2018-10-29 NOTE — Telephone Encounter (Signed)
Copied from [place] [id number]. Topic: General - Other >> Oct 29, 2018 12:45 PM Lennox Solders wrote: Reason for CRM: pt cancelled her hospital follow up today and now would like zpak [facility] [address]. Last zpak abx was prescribed by er doctor. Pt is aware she may need to be seen

## 2018-10-29 NOTE — Telephone Encounter (Signed)
Pt cancelled because she did not want to get out in the cold and get sicker.

## 2018-10-29 NOTE — Telephone Encounter (Signed)
Copied from [place] [id number]. Topic: General - Other >> Oct 29, 2018 11:21 AM Ivar Drape wrote: Reason for CRM:   Patient said she will reschedule her hospital follow up appt when her symptoms come back.

## 2018-10-29 NOTE — Telephone Encounter (Signed)
Sorry it was er fup

## 2018-10-30 ENCOUNTER — Other Ambulatory Visit: Payer: Self-pay | Admitting: Medical

## 2018-10-30 NOTE — Telephone Encounter (Signed)
Copied from [place] [id number]. Topic: Quick Communication - Rx Refill/Question >> Oct 30, 2018 11:39 AM Cecelia Byars, NT wrote: Medication: atorvastatin (LIPITOR) 10 MG tablet  Has the patient contacted their pharmacy? yes  (Agent: If no, request that the patient contact the pharmacy for the refill. (Agent: If yes, when and what did the pharmacy advise? States they have sent over several requests for a refill with no response , and for the patient to call and request herself Preferred Pharmacy (with phone number or street name  [id number] [address] [place], [place] - [place] [phone] (Phone) [phone] (Fax)    Agent: Please be advised that RX refills may take up to 3 business days. We ask that you follow-up with your pharmacy.

## 2018-10-31 ENCOUNTER — Other Ambulatory Visit: Payer: Self-pay | Admitting: Medical

## 2018-10-31 MED ORDER — ATORVASTATIN CALCIUM 10 MG PO TABS: ORAL_TABLET | ORAL | 3 refills | Status: DC

## 2018-10-31 NOTE — Telephone Encounter (Signed)
Requested medication (s) are due for refill today: yes  Requested medication (s) are on the active medication list: ys  Last refill:  03/08/17  Future visit scheduled: no  Notes to clinic:  Outdated labs   Requested Prescriptions  Pending Prescriptions Disp Refills   atorvastatin (LIPITOR) 10 MG tablet 45 tablet 3    Sig: Take 1/2 tablet (5mg ) by mouth daily     Cardiovascular:  Antilipid - Statins Failed - 10/30/2018  1:56 PM      Failed - Total Cholesterol in normal range and within 360 days    Cholesterol  Date Value Ref Range Status  09/19/2017 145 0 - 200 mg/dL Final    Comment:    ATP III Classification       Desirable:  < 200 mg/dL               Borderline High:  200 - 239 mg/dL          High:  > = 240 mg/dL         Failed - LDL in normal range and within 360 days    LDL Cholesterol  Date Value Ref Range Status  09/19/2017 61 0 - 99 mg/dL Final         Failed - HDL in normal range and within 360 days    HDL  Date Value Ref Range Status  09/19/2017 69.40 >39.00 mg/dL Final         Failed - Triglycerides in normal range and within 360 days    Triglycerides  Date Value Ref Range Status  09/19/2017 72.0 0.0 - 149.0 mg/dL Final    Comment:    Normal:  <150 mg/dLBorderline High:  150 - 199 mg/dL         Passed - Patient is not pregnant      Passed - Valid encounter within last 12 months    Recent Outpatient Visits          2 weeks ago Cough   [facility] at [place], [place]   1 month ago Cough   [facility], [place], PA-C   1 month ago [facility] at [place], [place]   2 months ago Other fatigue   [facility], [place], PA-C   4 months ago [facility] at [place], [place]

## 2018-11-15 DIAGNOSIS — J189 Pneumonia, unspecified organism: Secondary | ICD-10-CM | POA: Diagnosis not present

## 2018-11-17 ENCOUNTER — Telehealth: Payer: Self-pay

## 2018-11-17 NOTE — Telephone Encounter (Signed)
[facility] follow up call made to patient to see if she went to ED as advised for her cough. No answer when I called.

## 2018-11-18 LAB — CUP PACEART REMOTE DEVICE CHECK
Battery Remaining Longevity: 95 mo
Battery Remaining Percentage: 95.5 %
Brady Statistic AP VP Percent: 40 %
Brady Statistic AP VS Percent: 1 %
Brady Statistic AS VP Percent: 60 %
Brady Statistic AS VS Percent: 1 %
Brady Statistic RV Percent Paced: 99 %
Date Time Interrogation Session: 20191030060015
Implantable Lead Implant Date: 20170907
Implantable Lead Implant Date: 20170907
Implantable Lead Location: 753859
Lead Channel Impedance Value: 400 Ohm
Lead Channel Impedance Value: 400 Ohm
Lead Channel Pacing Threshold Amplitude: 0.5 V
Lead Channel Pacing Threshold Pulse Width: 0.4 ms
Lead Channel Pacing Threshold Pulse Width: 0.4 ms
Lead Channel Sensing Intrinsic Amplitude: 6 mV
Lead Channel Setting Pacing Amplitude: 2.5 V
Lead Channel Setting Pacing Amplitude: 2.5 V
Lead Channel Setting Pacing Pulse Width: 0.4 ms
Lead Channel Setting Sensing Sensitivity: 2 mV
MDC IDC LEAD LOCATION: 753860
MDC IDC MSMT BATTERY VOLTAGE: 2.98 V
MDC IDC MSMT LEADCHNL RA PACING THRESHOLD AMPLITUDE: 0.75 V
MDC IDC MSMT LEADCHNL RA SENSING INTR AMPL: 4.1 mV
MDC IDC PG IMPLANT DT: 20170907
MDC IDC STAT BRADY RA PERCENT PACED: 39 %
Pulse Gen Model: 2272
Pulse Gen Serial Number: 7944486

## 2018-11-27 ENCOUNTER — Encounter: Payer: Self-pay | Admitting: Medical

## 2018-11-27 ENCOUNTER — Ambulatory Visit (INDEPENDENT_AMBULATORY_CARE_PROVIDER_SITE_OTHER): Payer: Medicare HMO | Admitting: Medical

## 2018-11-27 VITALS — BP 132/76 | HR 70 | Temp 98.6°F | Resp 16 | Ht 59.0 in | Wt 95.6 lb

## 2018-11-27 DIAGNOSIS — J069 Acute upper respiratory infection, unspecified: Secondary | ICD-10-CM

## 2018-11-27 MED ORDER — BENZONATATE 100 MG PO CAPS: 100.0000 mg | ORAL_CAPSULE | Freq: Three times a day (TID) | ORAL | 0 refills | Status: DC | PRN

## 2018-11-27 MED ORDER — AZITHROMYCIN 250 MG PO TABS: ORAL_TABLET | ORAL | 0 refills | Status: DC

## 2018-11-27 NOTE — Patient Instructions (Addendum)
You had some upper respiratory infection type symptoms over the last 2 weeks.  Some intermittent  productive cough.  I am refilling your benzonatate.  By exam/auscultation of your lungs today I am not suspicious for bronchitis or pneumonia.  However based on your history and age I am going to give you a print prescription that you can hold and start if your symptoms worsen or change as we discussed.  If you do need to start the antibiotic please call our office and give me an update.  Follow-up in 7 to 10 days or sooner if needed.

## 2018-11-27 NOTE — Progress Notes (Signed)
Subjective:    Patient ID: Tracey Holt, female    DOB: 1922-02-20, 85 y.o.   MRN: [id number]  HPI  Pt in for mild cough. This cough has been present for 2 weeks for cough. Sometimes productive. No fever, no chills or sweats. No wheezing.   Pt was feeling some fatigue. Energy level has varied past 2 weeks  Today good energy.  She is in hurry to leave since son has to get back to work.     Review of Systems  Constitutional: Positive for fatigue. Negative for chills and fever.       See hpi.  HENT: Negative for congestion, ear pain, postnasal drip, rhinorrhea, sinus pressure and sinus pain.   Respiratory: Positive for cough. Negative for shortness of breath and wheezing.   Cardiovascular: Negative for chest pain and palpitations.  Gastrointestinal: Negative for abdominal pain.  Musculoskeletal: Negative for back pain.   Past Medical History:  Diagnosis Date  . Bradycardic cardiac arrest ([place]) 07/24/2016   Required CPR, intubation with multiple rib fractures. Had short term cardiac shock with acute heart failure.  Resulted in possible stress-induced cardiomyopathy  . Cardiac related syncope 07/24/2016   Bradycardic arrest requiring CPR 2 with transvenous pacemaker placed followed by permanent pacemaker; complicated by respiratory arrest, type II MI, stress-induced cardiopathy  . Cardiomyopathy ([place]) 07/2016   Moderate severely reduced EF with apical hypokinesis suggestive of stress-induced cardiac myopathy versus multivessel CAD --> in setting of her to cardiac arrest  . COPD (chronic obstructive pulmonary disease) ([place])    By report  . History of chronic constipation   . Hypercholesterolemia   . Osteoporosis   . Pacemaker 07/26/2016   Abbott dual-chamber pacemaker: [employer] #[id number] - Serial [id number]  . Pneumonia 08/2016  . Sarcoidosis of lung ([place])   . Spinal stenosis      Social History   Socioeconomic History  . Marital status: Widowed    Spouse name:  Not on file  . Number of children: Not on file  . Years of education: Not on file  . Highest education level: Not on file  Occupational History  . Not on file  Social Needs  . Financial resource strain: Not on file  . Food insecurity:    Worry: Not on file    Inability: Not on file  . Transportation needs:    Medical: Not on file    Non-medical: Not on file  Tobacco Use  . Smoking status: Former Smoker    Packs/day: 1.50    Years: 17.00    Pack years: 25.50    Types: Cigarettes    Start date: 12/18/1963    Last attempt to quit: 11/20/1983    Years since quitting: 35.0  . Smokeless tobacco: Never Used  Substance and Sexual Activity  . Alcohol use: No  . Drug use: No  . Sexual activity: Not on file  Lifestyle  . Physical activity:    Days per week: Not on file    Minutes per session: Not on file  . Stress: Not on file  Relationships  . Social connections:    Talks on phone: Not on file    Gets together: Not on file    Attends religious service: Not on file    Active member of club or organization: Not on file    Attends meetings of clubs or organizations: Not on file    Relationship status: Not on file  . Intimate partner violence:    Fear  of current or ex partner: Not on file    Emotionally abused: Not on file    Physically abused: Not on file    Forced sexual activity: Not on file  Other Topics Concern  . Not on file  Social History Narrative   Recently moved to [place] from [place] to live near her daughter.    Past Surgical History:  Procedure Laterality Date  . ADENOIDECTOMY    . PACEMAKER INSERTION Left 07/26/2016   Abbott Assurity Model #EX51700 - Serial #[id number] -- complicated by pneumothorax requiring chest tube placement  . TEE WITHOUT CARDIOVERSION  02/2013   EF 55-60% with normal global function. No thrombus, mass or vegetation. Trivial-small pericardial effusion. Moderate LA dilation. Mild MR.  . TONSILLECTOMY    . TRANSTHORACIC  ECHOCARDIOGRAM  07/24/2016   [facility]: EF 45-50%. Mid and apical inferior, inferolateral and apical hypokinesis. GR 2 DD. Mild LA and moderate RA dilation. Mild paracardial effusion. Moderate to severe TR. Basal and mid segment hypokinesis with apical hypokinesis. - Suspect stress-induced cardiopathy versus multivessel CAD.  Marland Kitchen TRANSTHORACIC ECHOCARDIOGRAM  07/26/2016   [facility]: LIMITED (post PPM) - although the report suggested EF 30-35% with apical akinesis, rounding note suggested this was an improvement    Family History  Problem Relation Age of Onset  . Allergic rhinitis Neg Hx   . Angioedema Neg Hx   . Asthma Neg Hx   . Eczema Neg Hx   . Immunodeficiency Neg Hx   . Urticaria Neg Hx     Allergies  Allergen Reactions  . Dust Mite Extract Shortness Of Breath  . Lidex [Fluocinonide] Swelling    Caused lip swelling  . Molds & Smuts Shortness Of Breath  . Lactose   . Levaquin [Levofloxacin In D5w]     Couldn't breath   . Penicillins     Arms swelling   . Rifampin     Couldn't breath     Current Outpatient Medications on File Prior to Visit  Medication Sig Dispense Refill  . acetaminophen (TYLENOL) 500 MG tablet Take 500-1,000 mg by mouth every 6 (six) hours as needed for moderate pain or headache.    . albuterol (PROVENTIL HFA;VENTOLIN HFA) 108 (90 Base) MCG/ACT inhaler Inhale 2 puffs into the lungs every 6 (six) hours as needed for wheezing or shortness of breath. 1 Inhaler 2  . amLODipine (NORVASC) 2.5 MG tablet TAKE 1 TABLET BY MOUTH EVERY DAY 90 tablet 1  . Ascorbic Acid (VITAMIN C) 1000 MG tablet Take 1,000 mg by mouth daily.    Marland Kitchen atorvastatin (LIPITOR) 10 MG tablet Take 1/2 tablet (5mg ) by mouth daily 45 tablet 3  . B Complex Vitamins (VITAMIN B COMPLEX) TABS Take by mouth.    . Biotin 10000 MCG TABS Take 10,000 mcg by mouth daily.     . Calcium Citrate-Vitamin D (CITRACAL + D PO) Take 1 tablet by mouth 2 (two) times  daily.    . ciclopirox (PENLAC) 8 % solution Apply topically at bedtime. Apply over nail and surrounding skin. Apply daily over previous coat. After seven (7) days, may remove with alcohol and continue cycle. (Patient taking differently: Apply 1 application topically at bedtime. Apply over nail and surrounding skin. Apply daily over previous coat. After seven (7) days, may remove with alcohol and continue cycle.) 6.6 mL 0  . Coenzyme Q10 (CO Q 10) 100 MG CAPS Take 100 mg by mouth daily.    Marland Kitchen docusate sodium (COLACE)  100 MG capsule Take 100 mg by mouth daily as needed for mild constipation.     . fluticasone (FLONASE) 50 MCG/ACT nasal spray Place 1 spray into both nostrils daily as needed for allergies.     . hydroxypropyl methylcellulose / hypromellose (ISOPTO TEARS / GONIOVISC) 2.5 % ophthalmic solution Place 1 drop into both eyes 3 (three) times daily.     Marland Kitchen ipratropium (ATROVENT HFA) 17 MCG/ACT inhaler Inhale 2 puffs into the lungs every 6 (six) hours as needed for wheezing. 1 Inhaler 2  . Misc Natural Products (GLUCOS-CHONDROIT-MSM COMPLEX) TABS Take 2 tablets by mouth daily.    . Multiple Vitamins-Minerals (MULTIVITAMIN WITH MINERALS) tablet Take 1 tablet by mouth daily.    . mupirocin cream (BACTROBAN) 2 % Apply 1 application topically 2 (two) times daily. 30 g 0  . mupirocin ointment (BACTROBAN) 2 % Apply 1 application topically 2 (two) times daily as needed. (Patient taking differently: Apply 1 application topically 2 (two) times daily as needed (rash). ) 22 g   . Probiotic CAPS Take 1 capsule by mouth daily.     No current facility-administered medications on file prior to visit.     BP 132/76   Pulse 70   Temp 98.6 F (37 C) (Oral)   Resp 16   Ht 4\' 11"  (1.499 m)   Wt 95 lb 9.6 oz (43.4 kg)   SpO2 96%   BMI 19.31 kg/m       Objective:   Physical Exam  General  Mental Status - Alert. General Appearance - Well groomed. Not in acute distress.  Skin Rashes- No  Rashes.  HEENT Head- Normal. Ear Auditory Canal - Left- Normal. Right - Normal.Tympanic Membrane- Left- Normal. Right- Normal. Eye Sclera/Conjunctiva- Left- Normal. Right- Normal. Nose & Sinuses Nasal Mucosa- Left-  Boggy and Congested. Right-  Boggy and  Congested.Bilateral no  maxillary and  No frontal sinus pressure. Mouth & Throat Lips: Upper Lip- Normal: no dryness, cracking, pallor, cyanosis, or vesicular eruption. Lower Lip-Normal: no dryness, cracking, pallor, cyanosis or vesicular eruption. Buccal Mucosa- Bilateral- No Aphthous ulcers. Oropharynx- No Discharge or Erythema. Tonsils: Characteristics- Bilateral- No Erythema or Congestion. Size/Enlargement- Bilateral- No enlargement. Discharge- bilateral-None.  Neck Neck- Supple. No Masses.   Chest and Lung Exam Auscultation: Breath Sounds:-Clear even and unlabored.  Cardiovascular Auscultation:Rythm- Regular, rate and rhythm. Murmurs & Other Heart Sounds:Ausculatation of the heart reveal- No Murmurs.  Lymphatic Head & Neck General Head & Neck Lymphatics: Bilateral: Description- No Localized lymphadenopathy.        Assessment & Plan:  You had some upper respiratory infection type symptoms over the last 2 weeks.  Some intermittent  productive cough.  I am refilling your benzonatate.  By exam/auscultation of your lungs today I am not suspicious for bronchitis or pneumonia.  However based on your history and age I am going to give you a print prescription that you can hold and start if your symptoms worsen or change as we discussed.  If you do need to start the antibiotic please call our office and give me an update.  Follow-up in 7 to 10 days or sooner if needed.  Mackie Pai, PA-C

## 2018-12-01 ENCOUNTER — Ambulatory Visit: Payer: Self-pay | Admitting: *Deleted

## 2018-12-01 ENCOUNTER — Telehealth: Payer: Self-pay

## 2018-12-01 NOTE — Telephone Encounter (Signed)
[facility] call made to patient. States she is taking 24 hour Claritin in the am and a 24 hour Xyzal at night and this has helped her heaedache and lightheadedness.

## 2018-12-01 NOTE — Telephone Encounter (Signed)
Pt calling to ask if it is OK to take 24 hr Claritin in the mornings and Xyzal in the evenings.  Per chart review has been addressed by Santiago Glad.   Answer Assessment - Initial Assessment Questions 1. SYMPTOMS: "Do you have any symptoms?"     no 2. SEVERITY: If symptoms are present, ask "Are they mild, moderate or severe?"    N/A  Protocols used: MEDICATION QUESTION CALL-A-AH

## 2018-12-02 ENCOUNTER — Ambulatory Visit: Payer: Self-pay | Admitting: *Deleted

## 2018-12-02 NOTE — Telephone Encounter (Signed)
Pt called wanting to know if she can take a 24 hr Claritin in the morning and a 24 hr Xyzal at night. She has been taking them and wants to make sure it is ok. Pt advised to speak with her pharmacist. She said she did and was told to call her provider. So she would like for her provider to call her back. Pt was triaged yesterday with the same question. Routing to flow at [facility]  Answer Assessment - Initial Assessment Questions 1. SYMPTOMS: "Do you have any symptoms?"     no 2. SEVERITY: If symptoms are present, ask "Are they mild, moderate or severe?"     no  Protocols used: MEDICATION QUESTION CALL-A-AH

## 2018-12-02 NOTE — Telephone Encounter (Signed)
It is usually recommended just to use 1 antihistamine daily.  Out of the 2 she lasted I would just recommend 1 Xyzal tablet at night.  Please notify patient.

## 2018-12-02 NOTE — Telephone Encounter (Signed)
Pt called to ask if she can take 2 - 24 hr antihistamines  clariton in the a.m. and Zyzal in the p.m./ advised Pt someone will call her back / please advise

## 2018-12-03 NOTE — Telephone Encounter (Signed)
Left message on patients answering machine regarding antihistamine(Xyzal).

## 2018-12-04 ENCOUNTER — Telehealth: Payer: Self-pay | Admitting: Medical

## 2018-12-04 ENCOUNTER — Encounter: Payer: Self-pay | Admitting: *Deleted

## 2018-12-04 NOTE — Telephone Encounter (Signed)
This encounter was created in error - please disregard.

## 2018-12-04 NOTE — Telephone Encounter (Signed)
Pt given information per Mackie Pai, "It is usually recommended just to use 1 antihistamine daily.  Out of the 2 she lasted I would just recommend 1 Xyzal tablet at night"; she verbalized understanding; will route to office for notification of this encounter.

## 2018-12-04 NOTE — Telephone Encounter (Signed)
Left pt a message to call back. Okay for [facility] to give information.  

## 2018-12-04 NOTE — Telephone Encounter (Signed)
Pt returning call asking which medication she should take. Explained to pt again per notes of Edward,PA he would recommend taking Xyzal at night. Pt verbalized understanding.

## 2018-12-04 NOTE — Telephone Encounter (Signed)
Pt given information per Mackie Pai, "It is usually recommended just to use 1 antihistamine daily.  Out of the 2 she lasted I would just recommend 1 Xyzal tablet at night."; she verbalized understanding; also see nurse triage encounter dated 12/02/2018.

## 2018-12-06 ENCOUNTER — Observation Stay (HOSPITAL_COMMUNITY)
Admission: EM | Admit: 2018-12-06 | Discharge: 2018-12-07 | Disposition: A | Discharge status: Home / Self Care | Payer: Medicare HMO | Attending: Internal Medicine | Admitting: Internal Medicine

## 2018-12-06 ENCOUNTER — Other Ambulatory Visit: Payer: Self-pay

## 2018-12-06 ENCOUNTER — Emergency Department (HOSPITAL_COMMUNITY): Payer: Medicare HMO

## 2018-12-06 ENCOUNTER — Observation Stay (HOSPITAL_COMMUNITY): Payer: Medicare HMO

## 2018-12-06 DIAGNOSIS — J984 Other disorders of lung: Secondary | ICD-10-CM | POA: Diagnosis not present

## 2018-12-06 DIAGNOSIS — E871 Hypo-osmolality and hyponatremia: Principal | ICD-10-CM | POA: Insufficient documentation

## 2018-12-06 DIAGNOSIS — R05 Cough: Secondary | ICD-10-CM | POA: Diagnosis not present

## 2018-12-06 DIAGNOSIS — Z87891 Personal history of nicotine dependence: Secondary | ICD-10-CM | POA: Diagnosis not present

## 2018-12-06 DIAGNOSIS — Z79899 Other long term (current) drug therapy: Secondary | ICD-10-CM | POA: Insufficient documentation

## 2018-12-06 DIAGNOSIS — I428 Other cardiomyopathies: Secondary | ICD-10-CM

## 2018-12-06 DIAGNOSIS — E785 Hyperlipidemia, unspecified: Secondary | ICD-10-CM | POA: Diagnosis not present

## 2018-12-06 DIAGNOSIS — R51 Headache: Secondary | ICD-10-CM | POA: Diagnosis not present

## 2018-12-06 DIAGNOSIS — D869 Sarcoidosis, unspecified: Secondary | ICD-10-CM | POA: Insufficient documentation

## 2018-12-06 DIAGNOSIS — R531 Weakness: Secondary | ICD-10-CM | POA: Diagnosis not present

## 2018-12-06 DIAGNOSIS — I10 Essential (primary) hypertension: Secondary | ICD-10-CM

## 2018-12-06 DIAGNOSIS — R059 Cough, unspecified: Secondary | ICD-10-CM

## 2018-12-06 DIAGNOSIS — Z950 Presence of cardiac pacemaker: Secondary | ICD-10-CM | POA: Insufficient documentation

## 2018-12-06 DIAGNOSIS — R69 Illness, unspecified: Secondary | ICD-10-CM | POA: Diagnosis not present

## 2018-12-06 LAB — COMPREHENSIVE METABOLIC PANEL
ALT: 34 U/L (ref 0–44)
ANION GAP: 9 (ref 5–15)
AST: 39 U/L (ref 15–41)
Albumin: 3.6 g/dL (ref 3.5–5.0)
Alkaline Phosphatase: 49 U/L (ref 38–126)
BUN: 15 mg/dL (ref 8–23)
CO2: 26 mmol/L (ref 22–32)
Calcium: 8.9 mg/dL (ref 8.9–10.3)
Chloride: 89 mmol/L — ABNORMAL LOW (ref 98–111)
Creatinine, Ser: 0.57 mg/dL (ref 0.44–1.00)
GFR calc Af Amer: >60 mL/min (ref >60)
GFR calc non Af Amer: >60 mL/min (ref >60)
Glucose, Bld: 107 mg/dL — ABNORMAL HIGH (ref 70–99)
POTASSIUM: 3.6 mmol/L (ref 3.5–5.1)
Sodium: 124 mmol/L — ABNORMAL LOW (ref 135–145)
Total Bilirubin: 0.9 mg/dL (ref 0.3–1.2)
Total Protein: 6 g/dL — ABNORMAL LOW (ref 6.5–8.1)

## 2018-12-06 LAB — BASIC METABOLIC PANEL
Anion gap: 10 (ref 5–15)
Anion gap: 8 (ref 5–15)
BUN: 12 mg/dL (ref 8–23)
BUN: 13 mg/dL (ref 8–23)
CO2: 25 mmol/L (ref 22–32)
CO2: 28 mmol/L (ref 22–32)
CREATININE: 0.49 mg/dL (ref 0.44–1.00)
CREATININE: 0.57 mg/dL (ref 0.44–1.00)
Calcium: 9.3 mg/dL (ref 8.9–10.3)
Calcium: 9 mg/dL (ref 8.9–10.3)
Chloride: 98 mmol/L (ref 98–111)
Chloride: 96 mmol/L — ABNORMAL LOW (ref 98–111)
GFR calc Af Amer: >60 mL/min (ref >60)
GFR calc Af Amer: >60 mL/min (ref >60)
GFR calc non Af Amer: >60 mL/min (ref >60)
GFR calc non Af Amer: >60 mL/min (ref >60)
Glucose, Bld: 92 mg/dL (ref 70–99)
Glucose, Bld: 90 mg/dL (ref 70–99)
Potassium: 4.6 mmol/L (ref 3.5–5.1)
Potassium: 4 mmol/L (ref 3.5–5.1)
Sodium: 133 mmol/L — ABNORMAL LOW (ref 135–145)
Sodium: 132 mmol/L — ABNORMAL LOW (ref 135–145)

## 2018-12-06 LAB — NA AND K (SODIUM & POTASSIUM), RAND UR
Potassium Urine: 8 mmol/L
Sodium, Ur: <10 mmol/L

## 2018-12-06 LAB — CBC
HCT: 35.4 % — ABNORMAL LOW (ref 36.0–46.0)
HCT: 36.3 % (ref 36.0–46.0)
HEMOGLOBIN: 11.8 g/dL — AB (ref 12.0–15.0)
Hemoglobin: 11.8 g/dL — ABNORMAL LOW (ref 12.0–15.0)
MCH: 29.4 pg (ref 26.0–34.0)
MCH: 28.2 pg (ref 26.0–34.0)
MCHC: 33.3 g/dL (ref 30.0–36.0)
MCHC: 32.5 g/dL (ref 30.0–36.0)
MCV: 88.3 fL (ref 80.0–100.0)
MCV: 86.8 fL (ref 80.0–100.0)
Platelets: 178 10*3/uL (ref 150–400)
Platelets: 181 10*3/uL (ref 150–400)
RBC: 4.01 MIL/uL (ref 3.87–5.11)
RBC: 4.18 MIL/uL (ref 3.87–5.11)
RDW: 13.7 % (ref 11.5–15.5)
RDW: 13.7 % (ref 11.5–15.5)
WBC: 4.8 10*3/uL (ref 4.0–10.5)
WBC: 5.1 10*3/uL (ref 4.0–10.5)
nRBC: 0 % (ref 0.0–0.2)
nRBC: 0 % (ref 0.0–0.2)

## 2018-12-06 LAB — DIFFERENTIAL
Abs Immature Granulocytes: 0.01 10*3/uL (ref 0.00–0.07)
Basophils Absolute: 0 10*3/uL (ref 0.0–0.1)
Basophils Relative: 1 %
Eosinophils Absolute: 0.1 10*3/uL (ref 0.0–0.5)
Eosinophils Relative: 2 %
Immature Granulocytes: 0 %
Lymphocytes Relative: 18 %
Lymphs Abs: 0.9 10*3/uL (ref 0.7–4.0)
Monocytes Absolute: 0.6 10*3/uL (ref 0.1–1.0)
Monocytes Relative: 13 %
NEUTROS ABS: 3.2 10*3/uL (ref 1.7–7.7)
Neutrophils Relative %: 66 %

## 2018-12-06 LAB — URINALYSIS, ROUTINE W REFLEX MICROSCOPIC
Bilirubin Urine: NEGATIVE
Bilirubin Urine: NEGATIVE
Glucose, UA: NEGATIVE mg/dL
Glucose, UA: NEGATIVE mg/dL
Hgb urine dipstick: NEGATIVE
Hgb urine dipstick: NEGATIVE
Ketones, ur: NEGATIVE mg/dL
Ketones, ur: NEGATIVE mg/dL
Leukocytes, UA: NEGATIVE
Leukocytes, UA: NEGATIVE
Nitrite: NEGATIVE
Nitrite: NEGATIVE
PROTEIN: NEGATIVE mg/dL
Protein, ur: NEGATIVE mg/dL
Specific Gravity, Urine: 1.002 — ABNORMAL LOW (ref 1.005–1.030)
Specific Gravity, Urine: 1.002 — ABNORMAL LOW (ref 1.005–1.030)
pH: 7 (ref 5.0–8.0)
pH: 8 (ref 5.0–8.0)

## 2018-12-06 LAB — TSH: TSH: 6.979 u[IU]/mL — ABNORMAL HIGH (ref 0.350–4.500)

## 2018-12-06 LAB — I-STAT TROPONIN, ED: TROPONIN I, POC: 0 ng/mL (ref 0.00–0.08)

## 2018-12-06 LAB — URIC ACID: URIC ACID, SERUM: 2.7 mg/dL (ref 2.5–7.1)

## 2018-12-06 LAB — CREATININE, SERUM
Creatinine, Ser: 0.54 mg/dL (ref 0.44–1.00)
GFR calc Af Amer: >60 mL/min (ref >60)
GFR calc non Af Amer: >60 mL/min (ref >60)

## 2018-12-06 LAB — CORTISOL: Cortisol, Plasma: 13.7 ug/dL

## 2018-12-06 LAB — OSMOLALITY, URINE: Osmolality, Ur: 95 mOsm/kg — ABNORMAL LOW (ref 300–900)

## 2018-12-06 MED ORDER — ACETAMINOPHEN 500 MG PO TABS
1000.0000 mg | ORAL_TABLET | Freq: Once | ORAL | Status: AC
  Administered 2018-12-06: 1000 mg via ORAL
  Filled 2018-12-06: qty 2

## 2018-12-06 MED ORDER — VITAMIN C 500 MG PO TABS
1000.0000 mg | ORAL_TABLET | Freq: Every day | ORAL | Status: DC
  Administered 2018-12-07: 1000 mg via ORAL
  Filled 2018-12-06: qty 2

## 2018-12-06 MED ORDER — RISAQUAD PO CAPS
1.0000 | ORAL_CAPSULE | Freq: Every day | ORAL | Status: DC
  Administered 2018-12-06 – 2018-12-07 (×2): 1 via ORAL
  Filled 2018-12-06 (×2): qty 1

## 2018-12-06 MED ORDER — ACETAMINOPHEN 650 MG RE SUPP: 650.0000 mg | Freq: Four times a day (QID) | RECTAL | Status: DC | PRN

## 2018-12-06 MED ORDER — FLUTICASONE PROPIONATE 50 MCG/ACT NA SUSP: 1.0000 | Freq: Every day | NASAL | Status: DC | PRN

## 2018-12-06 MED ORDER — LORATADINE 10 MG PO TABS
10.0000 mg | ORAL_TABLET | Freq: Every evening | ORAL | Status: DC
  Filled 2018-12-06: qty 1

## 2018-12-06 MED ORDER — ENOXAPARIN SODIUM 30 MG/0.3ML ~~LOC~~ SOLN
30.0000 mg | Freq: Every day | SUBCUTANEOUS | Status: DC
  Administered 2018-12-06 – 2018-12-07 (×2): 30 mg via SUBCUTANEOUS
  Filled 2018-12-06 (×2): qty 0.3

## 2018-12-06 MED ORDER — HYPROMELLOSE (GONIOSCOPIC) 2.5 % OP SOLN
1.0000 [drp] | Freq: Three times a day (TID) | OPHTHALMIC | Status: DC
  Filled 2018-12-06: qty 15

## 2018-12-06 MED ORDER — ACETAMINOPHEN 325 MG PO TABS
650.0000 mg | ORAL_TABLET | Freq: Four times a day (QID) | ORAL | Status: DC | PRN
  Administered 2018-12-06: 650 mg via ORAL
  Filled 2018-12-06: qty 2

## 2018-12-06 MED ORDER — POLYVINYL ALCOHOL 1.4 % OP SOLN
1.0000 [drp] | Freq: Three times a day (TID) | OPHTHALMIC | Status: DC
  Administered 2018-12-06 – 2018-12-07 (×2): 1 [drp] via OPHTHALMIC
  Filled 2018-12-06: qty 15

## 2018-12-06 MED ORDER — DOCUSATE SODIUM 100 MG PO CAPS: 100.0000 mg | ORAL_CAPSULE | Freq: Every day | ORAL | Status: DC | PRN

## 2018-12-06 MED ORDER — AMLODIPINE BESYLATE 5 MG PO TABS: 2.5000 mg | ORAL_TABLET | Freq: Every day | ORAL | Status: DC

## 2018-12-06 MED ORDER — SODIUM CHLORIDE 0.9 % IV BOLUS (SEPSIS): 500.0000 mL | Freq: Once | INTRAVENOUS | Status: DC

## 2018-12-06 MED ORDER — CO Q 10 100 MG PO CAPS: 100.0000 mg | ORAL_CAPSULE | Freq: Every day | ORAL | Status: DC

## 2018-12-06 MED ORDER — ADULT MULTIVITAMIN W/MINERALS CH
1.0000 | ORAL_TABLET | Freq: Every day | ORAL | Status: DC
  Administered 2018-12-07: 1 via ORAL
  Filled 2018-12-06: qty 1

## 2018-12-06 MED ORDER — IPRATROPIUM-ALBUTEROL 0.5-2.5 (3) MG/3ML IN SOLN: 3.0000 mL | Freq: Four times a day (QID) | RESPIRATORY_TRACT | Status: DC | PRN

## 2018-12-06 MED ORDER — BENZONATATE 100 MG PO CAPS: 100.0000 mg | ORAL_CAPSULE | Freq: Three times a day (TID) | ORAL | Status: DC | PRN

## 2018-12-06 MED ORDER — ACETAMINOPHEN 500 MG PO TABS: 500.0000 mg | ORAL_TABLET | Freq: Four times a day (QID) | ORAL | Status: DC | PRN

## 2018-12-06 MED ORDER — SODIUM CHLORIDE 0.9 % IV SOLN
INTRAVENOUS | Status: DC
  Administered 2018-12-06: 11:00:00 via INTRAVENOUS

## 2018-12-06 MED ORDER — HYDROCODONE-ACETAMINOPHEN 5-325 MG PO TABS
1.0000 | ORAL_TABLET | ORAL | Status: DC | PRN
  Filled 2018-12-06: qty 2

## 2018-12-06 MED ORDER — ATORVASTATIN CALCIUM 10 MG PO TABS
10.0000 mg | ORAL_TABLET | Freq: Every day | ORAL | Status: DC
  Administered 2018-12-06: 10 mg via ORAL
  Filled 2018-12-06: qty 1

## 2018-12-06 NOTE — ED Triage Notes (Signed)
Patient c/o HA and states that she feels "bad". Keeps saying "I am trying to think", but cannot give more specifics as to why she called 911. Per EMS patient said she was "weak and felt like she was dying". EMS felt like pt was anxious because she was unable to get in touch with her son.

## 2018-12-06 NOTE — H&P (Signed)
History and Physical    DOA: 12/06/2018  PCP: Mackie Pai, PA-C  Patient coming from: [facility]  Chief Complaint: Headache since yesterday  HPI: Tracey Holt is a 85 y.o. female with history h/o bradycardic related syncope/cardiac arrest status post pacemaker, cardiomyopathy with EF of 45 to 50%, COPD?  Sarcoidosis (patient not aware of this diagnosis), spinal stenosis, hypercholesterolemia, hypertension who lives in independent living facility presented today with complaints of headache.  Patient reports frontal headache 6-7 over 10 which started yesterday and gradually worsened overnight.  Denies any associated nausea vomiting or diarrhea.  Denies any abdominal pain.  She has had cough since she was diagnosed with pneumonia in November 2019.  She received 2 courses of antibiotics (Bactrim in November and azithromycin in December) for cough/bronchitis and presumed pneumonia.  It appears that patient was also evaluated by her PCP on January 9 when she reported persistent cough.  She was felt to have reactive airway disease but also prescribed antibiotics with instructions to hold the prescription and to be taken only if needed.  Patient denies feeling this prescription.  She denies any recent leg swellings or shortness of breath or fevers.  She is not on diuretics at home.  She is not aware of the sarcoid diagnosis and she does not take steroids.  She reports somewhat decreased oral intake recently but not very significant change from baseline. Work-up in the ED revealed negative CT head but labs showed sodium of 124.  She is requested to be admitted for further work-up and management of symptomatic hyponatremia.   Review of Systems: As per HPI otherwise 10 point review of systems negative.    Past Medical History:  Diagnosis Date  . Bradycardic cardiac arrest ([place]) 07/24/2016   Required CPR, intubation with multiple rib fractures. Had short term cardiac shock with acute  heart failure.  Resulted in possible stress-induced cardiomyopathy  . Cardiac related syncope 07/24/2016   Bradycardic arrest requiring CPR 2 with transvenous pacemaker placed followed by permanent pacemaker; complicated by respiratory arrest, type II MI, stress-induced cardiopathy  . Cardiomyopathy ([place]) 07/2016   Moderate severely reduced EF with apical hypokinesis suggestive of stress-induced cardiac myopathy versus multivessel CAD --> in setting of her to cardiac arrest  . COPD (chronic obstructive pulmonary disease) ([place])    By report  . History of chronic constipation   . Hypercholesterolemia   . Osteoporosis   . Pacemaker 07/26/2016   Abbott dual-chamber pacemaker: [place] #[id number] - Serial [id number]  . Pneumonia 08/2016  . Sarcoidosis of lung ([place])   . Spinal stenosis     Past Surgical History:  Procedure Laterality Date  . ADENOIDECTOMY    . PACEMAKER INSERTION Left 07/26/2016   Abbott Assurity Model #[id number] - Serial #[id number] -- complicated by pneumothorax requiring chest tube placement  . TEE WITHOUT CARDIOVERSION  02/2013   EF 55-60% with normal global function. No thrombus, mass or vegetation. Trivial-small pericardial effusion. Moderate LA dilation. Mild MR.  . TONSILLECTOMY    . TRANSTHORACIC ECHOCARDIOGRAM  07/24/2016   [facility]: EF 45-50%. Mid and apical inferior, inferolateral and apical hypokinesis. GR 2 DD. Mild LA and moderate RA dilation. Mild paracardial effusion. Moderate to severe TR. Basal and mid segment hypokinesis with apical hypokinesis. - Suspect stress-induced cardiopathy versus multivessel CAD.  Marland Kitchen TRANSTHORACIC ECHOCARDIOGRAM  07/26/2016   [place]: LIMITED (post PPM) - although the report suggested EF 30-35% with apical akinesis, rounding note suggested this was an  improvement    Social history:  reports that she quit smoking about 35 years ago. Her smoking use included cigarettes. She  started smoking about 55 years ago. She has a 25.50 pack-year smoking history. She has never used smokeless tobacco. She reports that she does not drink alcohol or use drugs.   Allergies  Allergen Reactions  . Dust Mite Extract Shortness Of Breath  . Lidex [Fluocinonide] Swelling    Caused lip swelling  . Molds & Smuts Shortness Of Breath  . Lactose   . Levaquin [Levofloxacin In D5w]     Couldn't breath   . Penicillins     Arms swelling   . Rifampin     Couldn't breath     Family History  Problem Relation Age of Onset  . Allergic rhinitis Neg Hx   . Angioedema Neg Hx   . Asthma Neg Hx   . Eczema Neg Hx   . Immunodeficiency Neg Hx   . Urticaria Neg Hx       Prior to Admission medications   Medication Sig Start Date End Date Taking? Authorizing Provider  acetaminophen (TYLENOL) 500 MG tablet Take 500-1,000 mg by mouth every 6 (six) hours as needed for moderate pain or headache.   Yes [provider]  albuterol (PROVENTIL HFA;VENTOLIN HFA) 108 (90 Base) MCG/ACT inhaler Inhale 2 puffs into the lungs every 6 (six) hours as needed for wheezing or shortness of breath. 09/12/18  Yes Saguier, Percell Miller, PA-C  amLODipine (NORVASC) 2.5 MG tablet TAKE 1 TABLET BY MOUTH EVERY DAY Patient taking differently: Take 2.5 mg by mouth daily.  10/31/18  Yes Saguier, Percell Miller, PA-C  Ascorbic Acid (VITAMIN C) 1000 MG tablet Take 1,000 mg by mouth daily.   Yes [provider]  atorvastatin (LIPITOR) 10 MG tablet Take 1/2 tablet (5mg ) by mouth daily 10/31/18  Yes Saguier, Percell Miller, PA-C  benzonatate (TESSALON) 100 MG capsule Take 1 capsule (100 mg total) by mouth 3 (three) times daily as needed for cough. 11/27/18  Yes Saguier, Percell Miller, PA-C  Biotin 10000 MCG TABS Take 10,000 mcg by mouth daily.    Yes [provider]  Calcium Citrate-Vitamin D (CITRACAL + D PO) Take 1 tablet by mouth 2 (two) times daily.   Yes [provider]  Coenzyme Q10 (CO Q 10) 100 MG CAPS Take 100 mg  by mouth daily.   Yes [provider]  docusate sodium (COLACE) 100 MG capsule Take 100 mg by mouth daily as needed for mild constipation.    Yes [provider]  fluticasone (FLONASE) 50 MCG/ACT nasal spray Place 1 spray into both nostrils daily as needed for allergies.    Yes [provider]  hydroxypropyl methylcellulose / hypromellose (ISOPTO TEARS / GONIOVISC) 2.5 % ophthalmic solution Place 1 drop into both eyes 3 (three) times daily.    Yes [provider]  ipratropium (ATROVENT HFA) 17 MCG/ACT inhaler Inhale 2 puffs into the lungs every 6 (six) hours as needed for wheezing. 09/12/18  Yes Saguier, Percell Miller, PA-C  levocetirizine (XYZAL) 5 MG tablet Take 5 mg by mouth every evening.   Yes [provider]  Misc Natural Products (GLUCOS-CHONDROIT-MSM COMPLEX) TABS Take 2 tablets by mouth daily.   Yes [provider]  Multiple Vitamins-Minerals (MULTIVITAMIN WITH MINERALS) tablet Take 1 tablet by mouth daily.   Yes [provider]  Probiotic CAPS Take 1 capsule by mouth daily.   Yes [provider]  azithromycin (ZITHROMAX) 250 MG tablet Take 2 tablets  by mouth on day 1, followed by 1 tablet by mouth daily for 4 days. 11/27/18   Saguier, Percell Miller, PA-C  B Complex Vitamins (VITAMIN B COMPLEX) TABS Take by mouth.    [provider]  ciclopirox (PENLAC) 8 % solution Apply topically at bedtime. Apply over nail and surrounding skin. Apply daily over previous coat. After seven (7) days, may remove with alcohol and continue cycle. Patient not taking: Reported on 12/06/2018 12/04/17   Saguier, Percell Miller, PA-C  mupirocin cream (BACTROBAN) 2 % Apply 1 application topically 2 (two) times daily. Patient not taking: Reported on 12/06/2018 04/16/18   Saguier, Percell Miller, PA-C  mupirocin ointment (BACTROBAN) 2 % Apply 1 application topically 2 (two) times daily as needed. Patient taking differently: Apply 1 application topically 2 (two) times daily  as needed (rash).  05/26/18   Saguier, Percell Miller, PA-C    Physical Exam: Vitals:   12/06/18 0730 12/06/18 0745 12/06/18 0800 12/06/18 0815  BP: 120/73 113/70 108/65 118/65  Pulse: 71 70 69 70  Resp: (!) 26 20 18 18   Temp:      TempSrc:      SpO2: 94% 93% 94% 94%  Weight:      Height:        Constitutional: NAD, calm, comfortable Eyes: PERRL, lids and conjunctivae normal ENMT: Mucous membranes are moist. Posterior pharynx clear of any exudate or lesions.Normal dentition.  Neck: normal, supple, no masses, no thyromegaly Respiratory: clear to auscultation bilaterally, no wheezing, no crackles. Normal respiratory effort. No accessory muscle use.  Cardiovascular: Regular rate and rhythm, mild systolic murmur, No rubs / gallops. No extremity edema. 2+ pedal pulses. No carotid bruits.  Status post pacemaker on left anterior chest wall Abdomen: no tenderness, no masses palpated. No hepatosplenomegaly. Bowel sounds positive.  Musculoskeletal: no clubbing / cyanosis. No joint deformity upper and lower extremities. Good ROM, no contractures. Normal muscle tone.  Neurologic: CN 2-12 grossly intact. Sensation intact, DTR normal. Strength 5/5 in all 4.  Psychiatric: Normal judgment and insight. Alert and oriented x 3.  Mood irritable SKIN/catheters: Prominent veins on lower extremities, no rashes, lesions, ulcers. No induration  Labs on Admission: I have personally reviewed following labs and imaging studies  CBC: Recent Labs  Lab 12/06/18 0538  WBC 4.8  NEUTROABS 3.2  HGB 11.8*  HCT 35.4*  MCV 88.3  PLT 151   Basic Metabolic Panel: Recent Labs  Lab 12/06/18 0538  NA 124*  K 3.6  CL 89*  CO2 26  GLUCOSE 107*  BUN 15  CREATININE 0.57  CALCIUM 8.9   GFR: Estimated Creatinine Clearance: 29.3 mL/min (by C-G formula based on SCr of 0.57 mg/dL). Liver Function Tests: Recent Labs  Lab 12/06/18 0538  AST 39  ALT 34  ALKPHOS 49  BILITOT 0.9  PROT 6.0*  ALBUMIN 3.6   No results  for input(s): LIPASE, AMYLASE in the last 168 hours. No results for input(s): AMMONIA in the last 168 hours. Coagulation Profile: No results for input(s): INR, PROTIME in the last 168 hours. Cardiac Enzymes: No results for input(s): CKTOTAL, CKMB, CKMBINDEX, TROPONINI in the last 168 hours. BNP (last 3 results) Recent Labs    01/02/18 1443  PROBNP 76.0   HbA1C: No results for input(s): HGBA1C in the last 72 hours. CBG: No results for input(s): GLUCAP in the last 168 hours. Lipid Profile: No results for input(s): CHOL, HDL, LDLCALC, TRIG, CHOLHDL, LDLDIRECT in the last 72 hours. Thyroid Function Tests: No results for input(s): TSH, T4TOTAL, FREET4,  T3FREE, THYROIDAB in the last 72 hours. Anemia Panel: No results for input(s): VITAMINB12, FOLATE, FERRITIN, TIBC, IRON, RETICCTPCT in the last 72 hours. Urine analysis:    Component Value Date/Time   COLORURINE STRAW (A) 12/06/2018 0615   APPEARANCEUR CLEAR 12/06/2018 0615   LABSPEC 1.002 (L) 12/06/2018 0615   PHURINE 7.0 12/06/2018 0615   GLUCOSEU NEGATIVE 12/06/2018 0615   HGBUR NEGATIVE 12/06/2018 0615   BILIRUBINUR NEGATIVE 12/06/2018 0615   BILIRUBINUR negative 10/23/2017 1607   KETONESUR NEGATIVE 12/06/2018 0615   PROTEINUR NEGATIVE 12/06/2018 0615   UROBILINOGEN 0.2 10/23/2017 1607   NITRITE NEGATIVE 12/06/2018 0615   LEUKOCYTESUR NEGATIVE 12/06/2018 0615    Radiological Exams on Admission: Ct Head Wo Contrast  Result Date: 12/06/2018 CLINICAL DATA:  Severe headache EXAM: CT HEAD WITHOUT CONTRAST TECHNIQUE: Contiguous axial images were obtained from the base of the skull through the vertex without intravenous contrast. COMPARISON:  Head CT 08/13/2018 FINDINGS: Brain: There is no mass, hemorrhage or extra-axial collection. The size and configuration of the ventricles and extra-axial CSF spaces are normal. There is hypoattenuation of the white matter, most commonly indicating chronic small vessel disease. Vascular: No  abnormal hyperdensity of the major intracranial arteries or dural venous sinuses. No intracranial atherosclerosis. Skull: The visualized skull base, calvarium and extracranial soft tissues are normal. Sinuses/Orbits: No fluid levels or advanced mucosal thickening of the visualized paranasal sinuses. No mastoid or middle ear effusion. The orbits are normal. IMPRESSION: Chronic small vessel ischemia without acute intracranial abnormality. Electronically Signed   By: Ulyses Jarred M.D.   On: 12/06/2018 06:24    EKG: Independently reviewed.  Paced rhythm     Assessment and Plan:   1.  Symptomatic hyponatremia: Secondary to SIADH versus dehydration.  Urine specific gravity appears to be low.  Could be pneumonia or medication induced SIADH.  Will check urine electrolytes, osmolality as well as uric acid (less than 1.5 would indicate SIADH).  Check TSH and cortisol levels.  Will start on mild IV hydration until above lab results available, as patient may also have a mixed picture given recent decrease in oral intake and dry appearance clinically.  Serial BMP every 8 hours.  2.  Headache: Likely secondary to problem #1.  CT head negative  3.  Hypertension: Hold Norvasc given soft blood pressure with systolic in 811B  4.  Hyperlipidemia: Resume home medications  5.  Cough: Repeat chest x-ray.  If noted to have abnormality might need CT chest to rule out underlying lung malignancy given duration of symptoms greater than 3 months.  6.  Cardiomyopathy/bradycardic cardiac arrest status post pacemaker: No acute issues.  Follow-up as outpatient.  Not on any diuretics and does not appear to be volume overloaded.  Last EF was 45 to 50%. DVT prophylaxis: Lovenox  Code Status: Full code as confirmed by patient.   Family Communication: Discussed with patient. Health care proxy would be her son Consults called: None Admission status:  Patient admitted as observation as anticipated LOS less than 2  midnights. Patient can be discharged back to independent living facility when sodium level close to 130   Guilford Shi MD [facility] Pager [phone]  If 7PM-7AM, please contact night-coverage [email or web address] Password [facility]  12/06/2018, 8:36 AM

## 2018-12-06 NOTE — Progress Notes (Signed)

## 2018-12-06 NOTE — Progress Notes (Signed)
Patient refused to have IVF, RN explained the important of order. Pt still declined.  Ave Filter, RN

## 2018-12-06 NOTE — ED Provider Notes (Signed)
TIME SEEN: 5:54 AM  CHIEF COMPLAINT: "I do not feel well"  HPI: Patient is a 85 year old female with history of cardiomyopathy status post pacemaker, COPD, sarcoidosis who presents to the emergency department EMS stating that she does not feel well.  Patient is able to tell me that she has a headache but then tells me that "it is not that bad".  She is unable to tell me what else feels bad today.  She denies any chest pain or shortness of breath.  No vomiting or diarrhea.  Denies fever.  Denies numbness, tingling or focal weakness.  She lives at [facility].  ROS: See HPI Constitutional: no fever  Eyes: no drainage  ENT: no runny nose   Cardiovascular:  no chest pain  Resp: no SOB  GI: no vomiting GU: no dysuria Integumentary: no rash  Allergy: no hives  Musculoskeletal: no leg swelling  Neurological: no slurred speech ROS otherwise negative  PAST MEDICAL HISTORY/PAST SURGICAL HISTORY:  Past Medical History:  Diagnosis Date  . Bradycardic cardiac arrest ([place]) 07/24/2016   Required CPR, intubation with multiple rib fractures. Had short term cardiac shock with acute heart failure.  Resulted in possible stress-induced cardiomyopathy  . Cardiac related syncope 07/24/2016   Bradycardic arrest requiring CPR 2 with transvenous pacemaker placed followed by permanent pacemaker; complicated by respiratory arrest, type II MI, stress-induced cardiopathy  . Cardiomyopathy ([place]) 07/2016   Moderate severely reduced EF with apical hypokinesis suggestive of stress-induced cardiac myopathy versus multivessel CAD --> in setting of her to cardiac arrest  . COPD (chronic obstructive pulmonary disease) ([place])    By report  . History of chronic constipation   . Hypercholesterolemia   . Osteoporosis   . Pacemaker 07/26/2016   Abbott dual-chamber pacemaker: [place] #[id number] - Serial [id number]  . Pneumonia 08/2016  . Sarcoidosis of lung ([place])   . Spinal stenosis      MEDICATIONS:  Prior to Admission medications   Medication Sig Start Date End Date Taking? Authorizing Provider  acetaminophen (TYLENOL) 500 MG tablet Take 500-1,000 mg by mouth every 6 (six) hours as needed for moderate pain or headache.    [provider]  albuterol (PROVENTIL HFA;VENTOLIN HFA) 108 (90 Base) MCG/ACT inhaler Inhale 2 puffs into the lungs every 6 (six) hours as needed for wheezing or shortness of breath. 09/12/18   Saguier, Percell Miller, PA-C  amLODipine (NORVASC) 2.5 MG tablet TAKE 1 TABLET BY MOUTH EVERY DAY 10/31/18   Saguier, Percell Miller, PA-C  Ascorbic Acid (VITAMIN C) 1000 MG tablet Take 1,000 mg by mouth daily.    [provider]  atorvastatin (LIPITOR) 10 MG tablet Take 1/2 tablet (5mg ) by mouth daily 10/31/18   Saguier, Percell Miller, PA-C  azithromycin (ZITHROMAX) 250 MG tablet Take 2 tablets by mouth on day 1, followed by 1 tablet by mouth daily for 4 days. 11/27/18   Saguier, Percell Miller, PA-C  B Complex Vitamins (VITAMIN B COMPLEX) TABS Take by mouth.    [provider]  benzonatate (TESSALON) 100 MG capsule Take 1 capsule (100 mg total) by mouth 3 (three) times daily as needed for cough. 11/27/18   Saguier, Percell Miller, PA-C  Biotin 10000 MCG TABS Take 10,000 mcg by mouth daily.     [provider]  Calcium Citrate-Vitamin D (CITRACAL + D PO) Take 1 tablet by mouth 2 (two) times daily.    [provider]  ciclopirox (PENLAC) 8 % solution Apply topically at bedtime. Apply over nail and surrounding skin.  Apply daily over previous coat. After seven (7) days, may remove with alcohol and continue cycle. Patient taking differently: Apply 1 application topically at bedtime. Apply over nail and surrounding skin. Apply daily over previous coat. After seven (7) days, may remove with alcohol and continue cycle. 12/04/17   Saguier, Percell Miller, PA-C  Coenzyme Q10 (CO Q 10) 100 MG CAPS Take 100 mg by mouth daily.    [provider]  docusate sodium (COLACE) 100  MG capsule Take 100 mg by mouth daily as needed for mild constipation.     [provider]  fluticasone (FLONASE) 50 MCG/ACT nasal spray Place 1 spray into both nostrils daily as needed for allergies.     [provider]  hydroxypropyl methylcellulose / hypromellose (ISOPTO TEARS / GONIOVISC) 2.5 % ophthalmic solution Place 1 drop into both eyes 3 (three) times daily.     [provider]  ipratropium (ATROVENT HFA) 17 MCG/ACT inhaler Inhale 2 puffs into the lungs every 6 (six) hours as needed for wheezing. 09/12/18   Saguier, Percell Miller, PA-C  Misc Natural Products (GLUCOS-CHONDROIT-MSM COMPLEX) TABS Take 2 tablets by mouth daily.    [provider]  Multiple Vitamins-Minerals (MULTIVITAMIN WITH MINERALS) tablet Take 1 tablet by mouth daily.    [provider]  mupirocin cream (BACTROBAN) 2 % Apply 1 application topically 2 (two) times daily. 04/16/18   Saguier, Percell Miller, PA-C  mupirocin ointment (BACTROBAN) 2 % Apply 1 application topically 2 (two) times daily as needed. Patient taking differently: Apply 1 application topically 2 (two) times daily as needed (rash).  05/26/18   Saguier, Percell Miller, PA-C  Probiotic CAPS Take 1 capsule by mouth daily.    [provider]    ALLERGIES:  Allergies  Allergen Reactions  . Dust Mite Extract Shortness Of Breath  . Lidex [Fluocinonide] Swelling    Caused lip swelling  . Molds & Smuts Shortness Of Breath  . Lactose   . Levaquin [Levofloxacin In D5w]     Couldn't breath   . Penicillins     Arms swelling   . Rifampin     Couldn't breath     SOCIAL HISTORY:  Social History   Tobacco Use  . Smoking status: Former Smoker    Packs/day: 1.50    Years: 17.00    Pack years: 25.50    Types: Cigarettes    Start date: 12/18/1963    Last attempt to quit: 11/20/1983    Years since quitting: 35.0  . Smokeless tobacco: Never Used  Substance Use Topics  . Alcohol use: No    FAMILY HISTORY: Family History   Problem Relation Age of Onset  . Allergic rhinitis Neg Hx   . Angioedema Neg Hx   . Asthma Neg Hx   . Eczema Neg Hx   . Immunodeficiency Neg Hx   . Urticaria Neg Hx     EXAM: BP 111/79   Pulse 70   Temp 98 F (36.7 C) (Oral)   Resp (!) 22   Ht 4\' 11"  (1.499 m)   Wt 43.1 kg   SpO2 96%   BMI 19.19 kg/m  CONSTITUTIONAL: Alert and oriented to person, place and time and responds appropriately to questions.  Thin, elderly HEAD: Normocephalic, atraumatic EYES: Conjunctivae clear, pupils appear equal, EOMI ENT: normal nose; moist mucous membranes NECK: Supple, no meningismus, no nuchal rigidity, no LAD  CARD: RRR; S1 and S2 appreciated; no murmurs, no clicks, no rubs, no gallops RESP: Normal chest excursion without splinting or  tachypnea; breath sounds clear and equal bilaterally; no wheezes, no rhonchi, no rales, no hypoxia or respiratory distress, speaking full sentences ABD/GI: Normal bowel sounds; non-distended; soft, non-tender, no rebound, no guarding, no peritoneal signs, no hepatosplenomegaly BACK:  The back appears normal and is non-tender to palpation, there is no CVA tenderness, no midline spinal tenderness or step-off or deformity EXT: Normal ROM in all joints; non-tender to palpation; no edema; normal capillary refill; no cyanosis, no calf tenderness or swelling    SKIN: Normal color for age and race; warm; no rash NEURO: Moves all extremities equally, no drift, sensation to light touch intact diffusely, cranial nerves II through XII intact, normal speech PSYCH: The patient's mood and manner are appropriate. Grooming and personal hygiene are appropriate.  MEDICAL DECISION MAKING: Patient here with complaints of not feeling well.  Her only specific complaint is headache.  She denies that this is sudden onset, severe in nature.  No focal neurologic deficits.  She has no complaints of any infectious symptoms.  No chest pain or shortness of breath.  Given nonspecific complaints,  will obtain labs, urine, EKG, head CT.  ED PROGRESS: Labs show sodium of 124.  This is significantly lower than her baseline.  Will admit for hyponatremia.  She does not appear volume depleted on exam or significantly volume overloaded.  Patient does not appear to be on diuretics per her medication list.  She is unable to confirm or deny this.  7:27 AM Discussed patient's case with hospitalist, Dr. Earnest Conroy.  I have recommended admission and patient (and family if present) agree with this plan. Admitting physician will place admission orders.   I reviewed all nursing notes, vitals, pertinent previous records, EKGs, lab and urine results, imaging (as available).      EKG Interpretation  Date/Time:  Saturday December 06 2018 05:00:58 EST Ventricular Rate:  70 PR Interval:    QRS Duration: 151 QT Interval:  442 QTC Calculation: 477 R Axis:   -85 Text Interpretation:  AV PACED RHYTHM No significant change since last tracing Confirmed by Nieves Chapa, Cyril Mourning [phone]) on 12/06/2018 6:38:43 AM         CRITICAL CARE Performed by: Cyril Mourning Randel Hargens   Total critical care time: 45 minutes  Critical care time was exclusive of separately billable procedures and treating other patients.  Critical care was necessary to treat or prevent imminent or life-threatening deterioration.  Critical care was time spent personally by me on the following activities: development of treatment plan with patient and/or surrogate as well as nursing, discussions with consultants, evaluation of patient's response to treatment, examination of patient, obtaining history from patient or surrogate, ordering and performing treatments and interventions, ordering and review of laboratory studies, ordering and review of radiographic studies, pulse oximetry and re-evaluation of patient's condition.    Lunden Mcleish, Delice Bison, DO 12/06/18 [phone]

## 2018-12-06 NOTE — Progress Notes (Signed)
Patient arrived to unit at this time form ED. Safety precautions and orders reviewed. POC updated. MD notified of her arrival. TELE applied and confirmed.  VSS.   Ave Filter, RN

## 2018-12-07 DIAGNOSIS — E871 Hypo-osmolality and hyponatremia: Secondary | ICD-10-CM | POA: Diagnosis not present

## 2018-12-07 LAB — BASIC METABOLIC PANEL
Anion gap: 8 (ref 5–15)
BUN: 9 mg/dL (ref 8–23)
CO2: 26 mmol/L (ref 22–32)
Calcium: 9 mg/dL (ref 8.9–10.3)
Chloride: 104 mmol/L (ref 98–111)
Creatinine, Ser: 0.48 mg/dL (ref 0.44–1.00)
GFR calc Af Amer: >60 mL/min (ref >60)
GFR calc non Af Amer: >60 mL/min (ref >60)
Glucose, Bld: 94 mg/dL (ref 70–99)
Potassium: 3.7 mmol/L (ref 3.5–5.1)
Sodium: 138 mmol/L (ref 135–145)

## 2018-12-07 LAB — CBC WITH DIFFERENTIAL/PLATELET
ABS IMMATURE GRANULOCYTES: 0.01 10*3/uL (ref 0.00–0.07)
Basophils Absolute: 0 10*3/uL (ref 0.0–0.1)
Basophils Relative: 1 %
Eosinophils Absolute: 0.2 10*3/uL (ref 0.0–0.5)
Eosinophils Relative: 3 %
HCT: 39.2 % (ref 36.0–46.0)
Hemoglobin: 12.7 g/dL (ref 12.0–15.0)
Immature Granulocytes: 0 %
LYMPHS ABS: 1.2 10*3/uL (ref 0.7–4.0)
Lymphocytes Relative: 24 %
MCH: 28.5 pg (ref 26.0–34.0)
MCHC: 32.4 g/dL (ref 30.0–36.0)
MCV: 87.9 fL (ref 80.0–100.0)
Monocytes Absolute: 0.6 10*3/uL (ref 0.1–1.0)
Monocytes Relative: 12 %
NEUTROS PCT: 60 %
Neutro Abs: 3 10*3/uL (ref 1.7–7.7)
Platelets: 186 10*3/uL (ref 150–400)
RBC: 4.46 MIL/uL (ref 3.87–5.11)
RDW: 14.3 % (ref 11.5–15.5)
WBC: 5 10*3/uL (ref 4.0–10.5)
nRBC: 0 % (ref 0.0–0.2)

## 2018-12-07 LAB — T4, FREE: Free T4: 0.83 ng/dL (ref 0.82–1.77)

## 2018-12-07 NOTE — Evaluation (Signed)
Physical Therapy Evaluation Patient Details Name: Tracey Holt MRN: [id number] DOB: 11-Sep-1922 Today's Date: 12/07/2018   History of Present Illness  Patient is a 85 y/o female who presents with HA and "not feeling well." Found to have hyponatremia. PMH includes systolic CHF (suspect stress induced cardiomyopathy), symptomatic bradycardia with cardiac arrest Sept 2017 s/p pacemaker, COPD, HLD, osteoporosis.  Clinical Impression  Patient presents with left knee pain, mild balance deficits and dyspnea on exertion s/p above. Tolerated transfers and ambulation with supervision for safety. Requires 1 standing rest break due to 2/4 DOE but VSS throughout. Pt lives at [facility] and participates in activities/trips. Reports no falls. Will follow acutely to maximize independence and mobility prior to return home to prevent deconditioning and weakness.    Follow Up Recommendations No PT follow up;Supervision - Intermittent    Equipment Recommendations  None recommended by PT    Recommendations for Other Services       Precautions / Restrictions Precautions Precautions: Fall Restrictions Weight Bearing Restrictions: No      Mobility  Bed Mobility               General bed mobility comments: Sitting EOB upon PT arrival.   Transfers Overall transfer level: Needs assistance Equipment used: None Transfers: Sit to/from Stand Sit to Stand: Supervision         General transfer comment: Supervision for safety.   Ambulation/Gait Ambulation/Gait assistance: Supervision Gait Distance (Feet): 250 Feet Assistive device: None Gait Pattern/deviations: Step-through pattern;Decreased stride length;Drifts right/left Gait velocity: decreased Gait velocity interpretation: 1.31 - 2.62 ft/sec, indicative of limited community ambulator General Gait Details: Slow, mostly steady gait veering to right/left occasionally but no LOB. 1 standing rest break due to 2/4 DOE. Reports as baseline.    Stairs            Wheelchair Mobility    Modified Rankin (Stroke Patients Only)       Balance Overall balance assessment: Needs assistance Sitting-balance support: Feet supported;No upper extremity supported Sitting balance-Leahy Scale: Good     Standing balance support: During functional activity Standing balance-Leahy Scale: Fair                               Pertinent Vitals/Pain Pain Assessment: Faces Faces Pain Scale: Hurts little more Pain Location: left knee  Pain Descriptors / Indicators: Sore Pain Intervention(s): Monitored during session    Home Living Family/patient expects to be discharged to:: Other (Comment)([facility])                      Prior Function Level of Independence: Independent         Comments: reports active in her ILF; plays games and goes on walks/trips. does not drive. Walks to cafeteria daily.     Hand Dominance        Extremity/Trunk Assessment   Upper Extremity Assessment Upper Extremity Assessment: Defer to OT evaluation    Lower Extremity Assessment Lower Extremity Assessment: Generalized weakness(but functional.)    Cervical / Trunk Assessment Cervical / Trunk Assessment: Kyphotic  Communication   Communication: HOH  Cognition Arousal/Alertness: Awake/alert Behavior During Therapy: WFL for tasks assessed/performed Overall Cognitive Status: History of cognitive impairments - at baseline                                 General Comments:  Seems to have some memory deficits otherwise appears [facility] for basic tasks. HOH.      General Comments General comments (skin integrity, edema, etc.): VSS throughout    Exercises     Assessment/Plan    PT Assessment Patient needs continued PT services  PT Problem List Decreased balance;Decreased cognition;Cardiopulmonary status limiting activity       PT Treatment Interventions Balance training;Patient/family education;Gait  training;Therapeutic exercise;Therapeutic activities    PT Goals (Current goals can be found in the Care Plan section)  Acute Rehab PT Goals Patient Stated Goal: to get home PT Goal Formulation: With patient Time For Goal Achievement: 12/21/18 Potential to Achieve Goals: Good    Frequency Min 3X/week   Barriers to discharge        Co-evaluation               AM-PAC PT "6 Clicks" Mobility  Outcome Measure Help needed turning from your back to your side while in a flat bed without using bedrails?: A Little Help needed moving from lying on your back to sitting on the side of a flat bed without using bedrails?: A Little Help needed moving to and from a bed to a chair (including a wheelchair)?: None Help needed standing up from a chair using your arms (e.g., wheelchair or bedside chair)?: None Help needed to walk in hospital room?: A Little Help needed climbing 3-5 steps with a railing? : A Little 6 Click Score: 20    End of Session Equipment Utilized During Treatment: Gait belt Activity Tolerance: Patient tolerated treatment well Patient left: in bed;with call bell/phone within reach;with bed alarm set(sitting EOB.) Nurse Communication: Mobility status PT Visit Diagnosis: Difficulty in walking, not elsewhere classified (R26.2);Unsteadiness on feet (R26.81)    Time: 1010-1025 PT Time Calculation (min) (ACUTE ONLY): 15 min   Charges:   PT Evaluation $PT Eval Low Complexity: 1 Low          Wray Kearns, PT, DPT Acute Rehabilitation Services Pager [phone] Office [phone]      Marguarite Arbour A Sabra Heck 12/07/2018, 10:34 AM

## 2018-12-07 NOTE — Progress Notes (Signed)
D/C instructions provided to patient, denies questions/concerns at this time. Patient transported to front entrance via WC, tol well. 

## 2018-12-07 NOTE — Discharge Summary (Signed)
Physician Discharge Summary  Tracey Holt QMV:[id number] DOB: 01-08-1922 DOA: 12/06/2018  PCP: Mackie Pai, PA-C  Admit date: 12/06/2018 Discharge date: 12/07/2018  Admitted From: [facility] Disposition: [facility]  Recommendations for Outpatient Follow-up:  1. Follow up with PCP in 1-2 weeks 2. Repeat TSH and Free T4 and T3 in 4-6 weeks 3. Please obtain CMP/CBC, Mag, Phos in one week 4. Please follow up on the following pending results:  Home Health: No Equipment/Devices: None recommended by PT    Discharge Condition: Stable  CODE STATUS: FULL CODE Diet recommendation: Regular Diet  Brief/Interim Summary: Tracey Holt is a 85 y.o. female with history h/o bradycardic related syncope/cardiac arrest status post pacemaker, cardiomyopathy with EF of 45 to 50%, COPD?  Sarcoidosis (patient not aware of this diagnosis), spinal stenosis, hypercholesterolemia, hypertension who lives in independent living facility presented today with complaints of headache.  Patient reports frontal headache 6-7 over 10 which started yesterday and gradually worsened overnight.  Denies any associated nausea vomiting or diarrhea.  Denies any abdominal pain.  She has had cough since she was diagnosed with pneumonia in November 2019.  She received 2 courses of antibiotics (Bactrim in November and azithromycin in December) for cough/bronchitis and presumed pneumonia.  It appears that patient was also evaluated by her PCP on January 9 when she reported persistent cough.  She was felt to have reactive airway disease but also prescribed antibiotics with instructions to hold the prescription and to be taken only if needed.  Patient denies feeling this prescription.  She denies any recent leg swellings or shortness of breath or fevers.  She is not on diuretics at home.  She is not aware of the sarcoid diagnosis and she does not take steroids.  She reports somewhat decreased oral intake recently but not very  significant change from baseline. Work-up in the ED revealed negative CT head but labs showed sodium of 124.  She is requested to be admitted for further work-up and management of symptomatic hyponatremia.  **Her hyponatremia improved significantly and she stopped on IV fluids.  She felt well and worked well with PT and they recommended no follow-up.  Her TSH was elevated but free T4 was normal and she will need repeat lab studies and I discussed this with her and she will follow-up with PCP and have this redrawn 4 to 6 weeks.  She has been deemed medically stable to be discharged to her ILF she will follow with her regular physician within 1 week.  Discharge Diagnoses:  Active Problems:   Hyponatremia syndrome  Symptomatic hyponatremia -Secondary to SIADH versus dehydration but likely Dehydration -Urine specific gravity appears to be low.   -Could be pneumonia or medication induced SIADH.   -Will check urine electrolytes, osmolality as well as uric acid (less than 1.5 would indicate SIADH).   -Check TSH (Elevated TSH but normal T4) and cortisol levels.   -Will start on mild IV hydration until above lab results available, as patient may also have a mixed picture given recent decrease in oral intake and dry appearance clinically.   -Serial BMP every 8 hours. -Sodium improved to 138 and patient was feeling better -IV fluid has been discontinued and she will need to follow-up with PCP -PT OT evaluated and recommending no follow-up  Headache -Likely secondary to problem #1.  CT head negative -Headache improved  Hypertension -Held Norvasc given soft blood pressure with systolic in 284X but blood pressure then improved to 136/82 and will resume at  discharge  Hyperlipidemia:  -Resume home medications  Cough -Repeat chest x-ray showed Bibasilar scarring. No edema or consolidation. Stable cardiac silhouette. There is aortic atherosclerosis. Bones osteoporotic. -Follow-up with PCP and  continue with symptomatic management  Cardiomyopathy/bradycardic cardiac arrest status post pacemaker:  -No acute issues.  Follow-up as outpatient.   -Not on any diuretics and does not appear to be volume overloaded.   -Last EF was 45 to 50%.   Discharge Instructions  Discharge Instructions    Call MD for:  difficulty breathing, headache or visual disturbances   Complete by:  As directed    Call MD for:  extreme fatigue   Complete by:  As directed    Call MD for:  hives   Complete by:  As directed    Call MD for:  persistant dizziness or light-headedness   Complete by:  As directed    Call MD for:  persistant nausea and vomiting   Complete by:  As directed    Call MD for:  redness, tenderness, or signs of infection (pain, swelling, redness, odor or green/yellow discharge around incision site)   Complete by:  As directed    Call MD for:  severe uncontrolled pain   Complete by:  As directed    Call MD for:  temperature >100.4   Complete by:  As directed    Diet - low sodium heart healthy   Complete by:  As directed    Discharge instructions   Complete by:  As directed    You were cared for by a hospitalist during your hospital stay. If you have any questions about your discharge medications or the care you received while you were in the hospital after you are discharged, you can call the unit and ask to speak with the hospitalist on call if the hospitalist that took care of you is not available. Once you are discharged, your primary care physician will handle any further medical issues. Please note that NO REFILLS for any discharge medications will be authorized once you are discharged, as it is imperative that you return to your primary care physician (or establish a relationship with a primary care physician if you do not have one) for your aftercare needs so that they can reassess your need for medications and monitor your lab values.  Follow up with PCP within 1 week. Take all  medications as prescribed. If symptoms change or worsen please return to the ED for evaluation   Increase activity slowly   Complete by:  As directed      Allergies as of 12/07/2018      Reactions   Dust Mite Extract Shortness Of Breath   Levaquin [levofloxacin In D5w] Shortness Of Breath   Lidex [fluocinonide] Swelling   Caused lip swelling   Molds & Smuts Shortness Of Breath   Rifampin Shortness Of Breath   Lactose Other (See Comments)   unknown   Penicillins Swelling   Did it involve swelling of the face/tongue/throat, SOB, or low BP? No Did it involve sudden or severe rash/hives, skin peeling, or any reaction on the inside of your mouth or nose? Unknown Did you need to seek medical attention at a hospital or doctor's office? Unknown When did it last happen?unk If all above answers are "NO", may proceed with cephalosporin use.      Medication List    STOP taking these medications   azithromycin 250 MG tablet Commonly known as:  ZITHROMAX   mupirocin ointment 2 %  Commonly known as:  BACTROBAN     TAKE these medications   acetaminophen 500 MG tablet Commonly known as:  TYLENOL Take 500-1,000 mg by mouth every 6 (six) hours as needed for moderate pain or headache.   albuterol 108 (90 Base) MCG/ACT inhaler Commonly known as:  PROVENTIL HFA;VENTOLIN HFA Inhale 2 puffs into the lungs every 6 (six) hours as needed for wheezing or shortness of breath.   amLODipine 2.5 MG tablet Commonly known as:  NORVASC TAKE 1 TABLET BY MOUTH EVERY DAY   atorvastatin 10 MG tablet Commonly known as:  LIPITOR Take 1/2 tablet (5mg ) by mouth daily What changed:    how much to take  how to take this  when to take this  additional instructions   benzonatate 100 MG capsule Commonly known as:  TESSALON Take 1 capsule (100 mg total) by mouth 3 (three) times daily as needed for cough.   Biotin 10000 MCG Tabs Take 10,000 mcg by mouth daily.   CITRACAL + D PO Take 1 tablet by  mouth 2 (two) times daily.   Co Q 10 100 MG Caps Take 100 mg by mouth daily.   docusate sodium 100 MG capsule Commonly known as:  COLACE Take 100 mg by mouth daily as needed for mild constipation.   fluticasone 50 MCG/ACT nasal spray Commonly known as:  FLONASE Place 1 spray into both nostrils daily as needed for allergies.   GLUCOS-CHONDROIT-MSM COMPLEX Tabs Take 2 tablets by mouth daily.   hydroxypropyl methylcellulose / hypromellose 2.5 % ophthalmic solution Commonly known as:  ISOPTO TEARS / GONIOVISC Place 1 drop into both eyes 3 (three) times daily.   ipratropium 17 MCG/ACT inhaler Commonly known as:  ATROVENT HFA Inhale 2 puffs into the lungs every 6 (six) hours as needed for wheezing.   levocetirizine 5 MG tablet Commonly known as:  XYZAL Take 5 mg by mouth every evening.   multivitamin with minerals tablet Take 1 tablet by mouth daily.   mupirocin cream 2 % Commonly known as:  BACTROBAN Apply 1 application topically 2 (two) times daily.   Probiotic Caps Take 1 capsule by mouth daily.   vitamin C 1000 MG tablet Take 1,000 mg by mouth daily.       Allergies  Allergen Reactions  . Dust Mite Extract Shortness Of Breath  . Levaquin [Levofloxacin In D5w] Shortness Of Breath  . Lidex [Fluocinonide] Swelling    Caused lip swelling  . Molds & Smuts Shortness Of Breath  . Rifampin Shortness Of Breath  . Lactose Other (See Comments)    unknown  . Penicillins Swelling    Did it involve swelling of the face/tongue/throat, SOB, or low BP? No Did it involve sudden or severe rash/hives, skin peeling, or any reaction on the inside of your mouth or nose? Unknown Did you need to seek medical attention at a hospital or doctor's office? Unknown When did it last happen?unk If all above answers are "NO", may proceed with cephalosporin use.    Consultations:  None  Procedures/Studies: Ct Head Wo Contrast  Result Date: 12/06/2018 CLINICAL DATA:  Severe  headache EXAM: CT HEAD WITHOUT CONTRAST TECHNIQUE: Contiguous axial images were obtained from the base of the skull through the vertex without intravenous contrast. COMPARISON:  Head CT 08/13/2018 FINDINGS: Brain: There is no mass, hemorrhage or extra-axial collection. The size and configuration of the ventricles and extra-axial CSF spaces are normal. There is hypoattenuation of the white matter, most commonly indicating chronic small vessel  disease. Vascular: No abnormal hyperdensity of the major intracranial arteries or dural venous sinuses. No intracranial atherosclerosis. Skull: The visualized skull base, calvarium and extracranial soft tissues are normal. Sinuses/Orbits: No fluid levels or advanced mucosal thickening of the visualized paranasal sinuses. No mastoid or middle ear effusion. The orbits are normal. IMPRESSION: Chronic small vessel ischemia without acute intracranial abnormality. Electronically Signed   By: Ulyses Jarred M.D.   On: 12/06/2018 06:24   Dg Chest Port 1 View  Result Date: 12/06/2018 CLINICAL DATA:  Shortness of breath and cough EXAM: PORTABLE CHEST 1 VIEW COMPARISON:  October 14, 2018 FINDINGS: There is mild scarring in the bases. There is no appreciable edema or consolidation. Heart size and pulmonary vascular normal. Pacemaker leads are attached to the right atrium and right ventricle. There is aortic atherosclerosis. Bones are osteoporotic. No adenopathy is appreciable. IMPRESSION: Bibasilar scarring. No edema or consolidation. Stable cardiac silhouette. There is aortic atherosclerosis. Bones osteoporotic. Aortic Atherosclerosis (ICD10-I70.0). Electronically Signed   By: Lowella Grip III M.D.   On: 12/06/2018 08:40     Subjective: Seen and examined at bedside she is sitting up in bed and is just finished breakfast and feeling well.  Denies chest pain, lightheadedness or dizziness.  No nausea or vomiting.  States her headache improved and she feels "wonderful".  Ready to  go back to her independent living facility.  No other concerns or complaints at this time  Discharge Exam: Vitals:   12/07/18 0414 12/07/18 0804  BP: 135/86   Pulse: 70   Resp: 18   Temp: 97.7 F (36.5 C) 98 F (36.7 C)  SpO2: 92%    Vitals:   12/06/18 2057 12/06/18 2337 12/07/18 0414 12/07/18 0804  BP: 133/88 (!) 141/82 135/86   Pulse: 70 71 70   Resp: 18 16 18    Temp: 97.7 F (36.5 C) (!) 97.4 F (36.3 C) 97.7 F (36.5 C) 98 F (36.7 C)  TempSrc: Oral Oral Oral Oral  SpO2: 97% 96% 92%   Weight:      Height:       General: Pt is alert, awake, not in acute distress Cardiovascular: RRR, S1/S2 +, no rubs, no gallops Respiratory: CTA bilaterally, no wheezing, no rhonchi Abdominal: Soft, NT, ND, bowel sounds + Extremities: no edema, no cyanosis  The results of significant diagnostics from this hospitalization (including imaging, microbiology, ancillary and laboratory) are listed below for reference.    Microbiology: No results found for this or any previous visit (from the past 240 hour(s)).   Labs: BNP (last 3 results) Recent Labs    08/13/18 0407 10/25/18 0939  BNP 90.7 97.3   Basic Metabolic Panel: Recent Labs  Lab 12/06/18 0538 12/06/18 0911 12/06/18 1403 12/06/18 2308 12/07/18 0526  NA 124*  --  133* 132* 138  K 3.6  --  4.6 4.0 3.7  CL 89*  --  98 96* 104  CO2 26  --  25 28 26   GLUCOSE 107*  --  92 90 94  BUN 15  --  12 13 9   CREATININE 0.57 0.54 0.49 0.57 0.48  CALCIUM 8.9  --  9.3 9.0 9.0   Liver Function Tests: Recent Labs  Lab 12/06/18 0538  AST 39  ALT 34  ALKPHOS 49  BILITOT 0.9  PROT 6.0*  ALBUMIN 3.6   No results for input(s): LIPASE, AMYLASE in the last 168 hours. No results for input(s): AMMONIA in the last 168 hours. CBC: Recent Labs  Lab 12/06/18  1610 12/06/18 0911 12/07/18 0933  WBC 4.8 5.1 5.0  NEUTROABS 3.2  --  3.0  HGB 11.8* 11.8* 12.7  HCT 35.4* 36.3 39.2  MCV 88.3 86.8 87.9  PLT 178 181 186   Cardiac  Enzymes: No results for input(s): CKTOTAL, CKMB, CKMBINDEX, TROPONINI in the last 168 hours. BNP: Invalid input(s): POCBNP CBG: No results for input(s): GLUCAP in the last 168 hours. D-Dimer No results for input(s): DDIMER in the last 72 hours. Hgb A1c No results for input(s): HGBA1C in the last 72 hours. Lipid Profile No results for input(s): CHOL, HDL, LDLCALC, TRIG, CHOLHDL, LDLDIRECT in the last 72 hours. Thyroid function studies Recent Labs    12/06/18 0911  TSH 6.979*   Anemia work up No results for input(s): VITAMINB12, FOLATE, FERRITIN, TIBC, IRON, RETICCTPCT in the last 72 hours. Urinalysis    Component Value Date/Time   COLORURINE COLORLESS (A) 12/06/2018 0820   APPEARANCEUR CLEAR 12/06/2018 0820   LABSPEC 1.002 (L) 12/06/2018 0820   PHURINE 8.0 12/06/2018 0820   GLUCOSEU NEGATIVE 12/06/2018 0820   HGBUR NEGATIVE 12/06/2018 0820   BILIRUBINUR NEGATIVE 12/06/2018 0820   BILIRUBINUR negative 10/23/2017 1607   KETONESUR NEGATIVE 12/06/2018 0820   PROTEINUR NEGATIVE 12/06/2018 0820   UROBILINOGEN 0.2 10/23/2017 1607   NITRITE NEGATIVE 12/06/2018 0820   LEUKOCYTESUR NEGATIVE 12/06/2018 0820   Sepsis Labs Invalid input(s): PROCALCITONIN,  WBC,  LACTICIDVEN Microbiology No results found for this or any previous visit (from the past 240 hour(s)).  Time coordinating discharge: 35 minutes  SIGNED:  Kerney Elbe, DO [facility] 12/07/2018, 11:38 AM Pager is on [place]  If 7PM-7AM, please contact night-coverage [email or web address] Password [email or web address]

## 2018-12-07 NOTE — Plan of Care (Signed)
Patient stable, discussed POC with patient and son, agreeable with DC plan, denies question/concerns at this time.

## 2018-12-08 LAB — T3: T3, Total: 76 ng/dL (ref 71–180)

## 2018-12-09 ENCOUNTER — Telehealth: Payer: Self-pay | Admitting: *Deleted

## 2018-12-09 NOTE — Telephone Encounter (Signed)
Transition Care Management Follow-up Telephone Call  PCP: Elise Benne  Admit date: 12/06/2018 Discharge date: 12/07/2018  Ad[facility] From: [facility] Disposition: [facility]  Recommendations for Outpatient Follow-up:  1. Follow up with PCP in 1-2 weeks 2. Repeat TSH and Free T4 and T3 in 4-6 weeks 3. Please obtain CMP/CBC, Mag, Phos in one week 4. Please follow up on the following pending results:  Home Health: No Equipment/Devices: None recommended by PT     How have you been since you were released from the hospital? "I'm fine"   Do you understand why you were in the hospital? Yes!   Do you understand the discharge instructions?yes   Where were you discharged to? [facility]   Items Reviewed:  Medications reviewed: yes  Allergies reviewed: yes  Dietary changes reviewed: yes  Referrals reviewed: yes   Functional Questionnaire:   Activities of Daily Living (ADLs):   She states they are independent in the following: ambulation, bathing and hygiene, feeding, continence, grooming, toileting and dressing States they require assistance with the following: n/a   Any transportation issues/concerns?: will need son to bring her   Any patient concerns? no   Confirmed importance and date/time of follow-up visits scheduled yes. Pt states she will contact her son and call later to schedule appt.  Confirmed with patient if condition begins to worsen call PCP or go to the ER.  Patient was given the office number and encouraged to call back with question or concerns.  :yes

## 2018-12-15 ENCOUNTER — Encounter: Payer: Self-pay | Admitting: Medical

## 2018-12-15 ENCOUNTER — Ambulatory Visit (HOSPITAL_BASED_OUTPATIENT_CLINIC_OR_DEPARTMENT_OTHER)
Admission: RE | Admit: 2018-12-15 | Discharge: 2018-12-15 | Disposition: A | Discharge status: Home / Self Care | Payer: Medicare HMO | Source: Ambulatory Visit | Attending: Medical | Admitting: Medical

## 2018-12-15 ENCOUNTER — Telehealth: Payer: Self-pay | Admitting: Medical

## 2018-12-15 ENCOUNTER — Ambulatory Visit (INDEPENDENT_AMBULATORY_CARE_PROVIDER_SITE_OTHER): Payer: Medicare HMO | Admitting: Medical

## 2018-12-15 VITALS — BP 132/77 | HR 81 | Temp 98.1°F | Resp 16 | Ht 59.0 in | Wt 93.4 lb

## 2018-12-15 DIAGNOSIS — R059 Cough, unspecified: Secondary | ICD-10-CM

## 2018-12-15 DIAGNOSIS — R5383 Other fatigue: Secondary | ICD-10-CM

## 2018-12-15 DIAGNOSIS — R05 Cough: Secondary | ICD-10-CM | POA: Insufficient documentation

## 2018-12-15 DIAGNOSIS — E871 Hypo-osmolality and hyponatremia: Secondary | ICD-10-CM | POA: Diagnosis not present

## 2018-12-15 DIAGNOSIS — I10 Essential (primary) hypertension: Secondary | ICD-10-CM | POA: Diagnosis not present

## 2018-12-15 NOTE — Progress Notes (Signed)
Subjective:    Patient ID: Tracey Holt, female    DOB: Apr 24, 1922, 85 y.o.   MRN: [id number]  HPI  Pt in states she is in today for follow up. She was admitted to hospital on 12/06/2018 and DC on 12/08/2018.  Pt discharge after her sodium vas corrected. Her discharge dx was  Discharge Diagnoses:  Active Problems:   Hyponatremia syndrome  Symptomatic hyponatremia -Secondary to SIADH versus dehydration but likely Dehydration -Urine specific gravity appears to be low.  -Could be pneumonia or medication induced SIADH.  -Will check urine electrolytes, osmolality as well as uric acid (less than 1.5 would indicate SIADH).  -Check TSH (Elevated TSH but normal T4) and cortisol levels.  -Will start on mild IV hydration until above lab results available, as patient may also have a mixed picture given recent decrease in oral intake and dry appearance clinically.  -Serial BMP every 8 hours. -Sodium improved to 138 and patient was feeling better -IV fluid has been discontinued and she will need to follow-up with PCP -PT OT evaluated and recommending no follow-up  Headache -Likely secondary to problem #1. CT head negative -Headache improved  Hypertension -Held Norvasc given soft blood pressure with systolic in 932T but blood pressure then improved to 136/82 and will resume at discharge  Hyperlipidemia:  -Resume home medications  Cough -Repeat chest x-ray showed Bibasilar scarring. No edema or consolidation. Stable cardiac silhouette. There is aortic atherosclerosis. Bones osteoporotic. -Follow-up with PCP and continue with symptomatic management  Cardiomyopathy/bradycardic cardiac arrest status post pacemaker:  -No acute issues. Follow-up as outpatient.  -Not on any diuretics and does not appear to be volume overloaded.  -Last EF was 45 to 50%.   On review of pt lab her coritisl was 13.7. Her uric acid was 2.7. BMP showed NA level of 132. Her sodium did come up to 138  quickly.    Pt states she took zpack yesterday(started old script I gave her which she did not take initially). She has some chest congestion and sinus pressure. Mild ha when she coughs. Her nose is running a little.  Pt states mild fatigue. ED summary stated to repeat. 1. Follow up with PCP in 1-2 weeks 2. Repeat TSH and Free T4 and T3 in 4-6 weeks 3. Please obtain CMP/CBC, Mag, Phos in one week 4. Please follow up on the following pending results:     Review of Systems  Constitutional: Positive for chills. Negative for fatigue and fever.       Rare chills.(states cold natured)  HENT: Positive for congestion, rhinorrhea and sinus pressure. Negative for postnasal drip, sinus pain and sore throat.   Respiratory: Positive for cough. Negative for chest tightness, shortness of breath and wheezing.   Cardiovascular: Negative for chest pain and palpitations.  Gastrointestinal: Negative for abdominal pain.  Musculoskeletal: Negative for back pain, neck pain and neck stiffness.  Neurological: Negative for dizziness and headaches.  Hematological: Negative for adenopathy. Does not bruise/bleed easily.  Psychiatric/Behavioral: Negative for behavioral problems, confusion and dysphoric mood.   Past Medical History:  Diagnosis Date  . Bradycardic cardiac arrest ([place]) 07/24/2016   Required CPR, intubation with multiple rib fractures. Had short term cardiac shock with acute heart failure.  Resulted in possible stress-induced cardiomyopathy  . Cardiac related syncope 07/24/2016   Bradycardic arrest requiring CPR 2 with transvenous pacemaker placed followed by permanent pacemaker; complicated by respiratory arrest, type II MI, stress-induced cardiopathy  . Cardiomyopathy ([place]) 07/2016   Moderate severely reduced EF  with apical hypokinesis suggestive of stress-induced cardiac myopathy versus multivessel CAD --> in setting of her to cardiac arrest  . COPD (chronic obstructive pulmonary disease) ([place])     By report  . History of chronic constipation   . Hypercholesterolemia   . Osteoporosis   . Pacemaker 07/26/2016   Abbott dual-chamber pacemaker: [place] #[id number] - Serial [id number]  . Pneumonia 08/2016  . Sarcoidosis of lung ([place])   . Spinal stenosis      Social History   Socioeconomic History  . Marital status: Widowed    Spouse name: Not on file  . Number of children: Not on file  . Years of education: Not on file  . Highest education level: Not on file  Occupational History  . Not on file  Social Needs  . Financial resource strain: Not on file  . Food insecurity:    Worry: Not on file    Inability: Not on file  . Transportation needs:    Medical: Not on file    Non-medical: Not on file  Tobacco Use  . Smoking status: Former Smoker    Packs/day: 1.50    Years: 17.00    Pack years: 25.50    Types: Cigarettes    Start date: 12/18/1963    Last attempt to quit: 11/20/1983    Years since quitting: 35.0  . Smokeless tobacco: Never Used  Substance and Sexual Activity  . Alcohol use: No  . Drug use: No  . Sexual activity: Not on file  Lifestyle  . Physical activity:    Days per week: Not on file    Minutes per session: Not on file  . Stress: Not on file  Relationships  . Social connections:    Talks on phone: Not on file    Gets together: Not on file    Attends religious service: Not on file    Active member of club or organization: Not on file    Attends meetings of clubs or organizations: Not on file    Relationship status: Not on file  . Intimate partner violence:    Fear of current or ex partner: Not on file    Emotionally abused: Not on file    Physically abused: Not on file    Forced sexual activity: Not on file  Other Topics Concern  . Not on file  Social History Narrative   Recently moved to [place] from [place] to live near her daughter.    Past Surgical History:  Procedure Laterality Date  . ADENOIDECTOMY    .  PACEMAKER INSERTION Left 07/26/2016   Abbott Assurity Model #[id number] - Serial #[id number] -- complicated by pneumothorax requiring chest tube placement  . TEE WITHOUT CARDIOVERSION  02/2013   EF 55-60% with normal global function. No thrombus, mass or vegetation. Trivial-small pericardial effusion. Moderate LA dilation. Mild MR.  . TONSILLECTOMY    . TRANSTHORACIC ECHOCARDIOGRAM  07/24/2016   [facility]: EF 45-50%. Mid and apical inferior, inferolateral and apical hypokinesis. GR 2 DD. Mild LA and moderate RA dilation. Mild paracardial effusion. Moderate to severe TR. Basal and mid segment hypokinesis with apical hypokinesis. - Suspect stress-induced cardiopathy versus multivessel CAD.  Marland Kitchen TRANSTHORACIC ECHOCARDIOGRAM  07/26/2016   [place]: LIMITED (post PPM) - although the report suggested EF 30-35% with apical akinesis, rounding note suggested this was an improvement    Family History  Problem Relation Age of Onset  . Allergic rhinitis Neg Hx   .  Angioedema Neg Hx   . Asthma Neg Hx   . Eczema Neg Hx   . Immunodeficiency Neg Hx   . Urticaria Neg Hx     Allergies  Allergen Reactions  . Dust Mite Extract Shortness Of Breath  . Levaquin [Levofloxacin In D5w] Shortness Of Breath  . Lidex [Fluocinonide] Swelling    Caused lip swelling  . Molds & Smuts Shortness Of Breath  . Rifampin Shortness Of Breath  . Lactose Other (See Comments)    unknown  . Penicillins Swelling    Did it involve swelling of the face/tongue/throat, SOB, or low BP? No Did it involve sudden or severe rash/hives, skin peeling, or any reaction on the inside of your mouth or nose? Unknown Did you need to seek medical attention at a hospital or doctor's office? Unknown When did it last happen?unk If all above answers are "NO", may proceed with cephalosporin use.     Current Outpatient Medications on File Prior to Visit  Medication Sig Dispense Refill  .  acetaminophen (TYLENOL) 500 MG tablet Take 500-1,000 mg by mouth every 6 (six) hours as needed for moderate pain or headache.    . albuterol (PROVENTIL HFA;VENTOLIN HFA) 108 (90 Base) MCG/ACT inhaler Inhale 2 puffs into the lungs every 6 (six) hours as needed for wheezing or shortness of breath. 1 Inhaler 2  . amLODipine (NORVASC) 2.5 MG tablet TAKE 1 TABLET BY MOUTH EVERY DAY (Patient taking differently: Take 2.5 mg by mouth daily. ) 90 tablet 1  . Ascorbic Acid (VITAMIN C) 1000 MG tablet Take 1,000 mg by mouth daily.    Marland Kitchen atorvastatin (LIPITOR) 10 MG tablet Take 1/2 tablet (5mg ) by mouth daily (Patient taking differently: Take 5 mg by mouth daily at 6 PM. ) 45 tablet 3  . benzonatate (TESSALON) 100 MG capsule Take 1 capsule (100 mg total) by mouth 3 (three) times daily as needed for cough. 30 capsule 0  . Biotin 10000 MCG TABS Take 10,000 mcg by mouth daily.     . Calcium Citrate-Vitamin D (CITRACAL + D PO) Take 1 tablet by mouth 2 (two) times daily.    . Coenzyme Q10 (CO Q 10) 100 MG CAPS Take 100 mg by mouth daily.    Marland Kitchen docusate sodium (COLACE) 100 MG capsule Take 100 mg by mouth daily as needed for mild constipation.     . fluticasone (FLONASE) 50 MCG/ACT nasal spray Place 1 spray into both nostrils daily as needed for allergies.     . hydroxypropyl methylcellulose / hypromellose (ISOPTO TEARS / GONIOVISC) 2.5 % ophthalmic solution Place 1 drop into both eyes 3 (three) times daily.     Marland Kitchen ipratropium (ATROVENT HFA) 17 MCG/ACT inhaler Inhale 2 puffs into the lungs every 6 (six) hours as needed for wheezing. 1 Inhaler 2  . levocetirizine (XYZAL) 5 MG tablet Take 5 mg by mouth every evening.    . Misc Natural Products (GLUCOS-CHONDROIT-MSM COMPLEX) TABS Take 2 tablets by mouth daily.    . Multiple Vitamins-Minerals (MULTIVITAMIN WITH MINERALS) tablet Take 1 tablet by mouth daily.    . mupirocin cream (BACTROBAN) 2 % Apply 1 application topically 2 (two) times daily. 30 g 0  . Probiotic CAPS Take 1  capsule by mouth daily.     No current facility-administered medications on file prior to visit.     There were no vitals taken for this visit.      Objective:   Physical Exam  General  Mental Status - Alert.  General Appearance - Well groomed. Not in acute distress. Intermittent harsh sounding cough.  Skin Rashes- No Rashes.  HEENT Head- Normal. Ear Auditory Canal - Left- Normal. Right - Normal.Tympanic Membrane- Left- Normal. Right- Normal. Eye Sclera/Conjunctiva- Left- Normal. Right- Normal. Nose & Sinuses Nasal Mucosa- Left-  Boggy and Congested. Right-  Boggy and  Congested.Bilateral maxillary and frontal sinus pressure. Mouth & Throat Lips: Upper Lip- Normal: no dryness, cracking, pallor, cyanosis, or vesicular eruption. Lower Lip-Normal: no dryness, cracking, pallor, cyanosis or vesicular eruption. Buccal Mucosa- Bilateral- No Aphthous ulcers. Oropharynx- No Discharge or Erythema. Tonsils: Characteristics- Bilateral- No Erythema or Congestion. Size/Enlargement- Bilateral- No enlargement. Discharge- bilateral-None.  Neck Neck- Supple. No Masses.   Chest and Lung Exam Auscultation: Breath Sounds:-Clear even and unlabored.  Cardiovascular Auscultation:Rythm- Regular, rate and rhythm. Murmurs & Other Heart Sounds:Ausculatation of the heart reveal- No Murmurs.  Lymphatic Head & Neck General Head & Neck Lymphatics: Bilateral: Description- No Localized lymphadenopathy.         Assessment & Plan:  You appear to be doing fairly well post hospitalization.  You did have low sodium and that was what your primary diagnoses are.  I will repeat metabolic panel today and make sure that your sodium is not very low.  You have had some recent cough over the last couple days and on exam faint upper lobe rough breath sounds.  We will have you go ahead and finish the remaining 4 days of the azithromycin.  You started that yesterday.  We will get chest x-ray and evaluate if  you have any pneumonia.  For cough you can continue with benzonatate.  For any fevers can use Tylenol.  Some mild sinus pressure as well recently with occasional headache when you cough.  We will see if azithromycin with potential sinus infection.  I would recommend using Flonase for nasal congestion.    I think the short-term benefits of using Flonase for 3 or 4 days out weigh unlikley risk.  Your blood pressure is well controlled today and you did report using amlodipine.  So would recommend that you continue using that daily.  On discharge summary it states it was held but since he took it and BP level is appropriate will recommend that you continue.  Will get labs that hospital recommend a follow-up.  That includes CBC, CMP, magnesium, TSH and T4.  Follow-up date to be determined after lab and imaging review.  Mackie Pai, PA-C

## 2018-12-15 NOTE — Patient Instructions (Addendum)
You appear to be doing fairly well post hospitalization.  You did have low sodium and that was what your primary diagnoses are.  I will repeat metabolic panel today and make sure that your sodium is not very low.  You have had some recent cough over the last couple days and on exam faint upper lobe rough breath sounds.  We will have you go ahead and finish the remaining 4 days of the azithromycin.  You started that yesterday.  We will get chest x-ray and evaluate if you have any pneumonia.  For cough you can continue with benzonatate.  For any fevers can use Tylenol.  Some mild sinus pressure as well recently with occasional headache when you cough.  We will see if azithromycin with potential sinus infection.  I would recommend using Flonase for nasal congestion.    I think the short-term benefits of using Flonase for 3 or 4 days out [place] weigh risk.  Your blood pressure is well controlled today and you did report using amlodipine.  So would recommend that you continue using that daily.  On discharge summary it states it was held but since he took it and BP level is appropriate will recommend that you continue.  Will get labs that hospital recommend a follow-up.  That includes CBC, CMP, magnesium, TSH and T4.  Follow-up date to be determined after lab and imaging review.

## 2018-12-15 NOTE — Telephone Encounter (Signed)
After hours got home and no height weight/vs per epic as I closed chart. Will you help remind me to find her sheet. If this was done will put in later. Will you please remember to put those in at time of  rooming patient.

## 2018-12-16 ENCOUNTER — Ambulatory Visit: Payer: Medicare HMO | Admitting: Medical

## 2018-12-16 LAB — COMPREHENSIVE METABOLIC PANEL
ALK PHOS: 60 U/L (ref 39–117)
ALT: 35 U/L (ref 0–35)
AST: 34 U/L (ref 0–37)
Albumin: 4.3 g/dL (ref 3.5–5.2)
BUN: 14 mg/dL (ref 6–23)
CO2: 31 mEq/L (ref 19–32)
Calcium: 9.9 mg/dL (ref 8.4–10.5)
Chloride: 95 mEq/L — ABNORMAL LOW (ref 96–112)
Creatinine, Ser: 0.61 mg/dL (ref 0.40–1.20)
GFR: 91.21 mL/min (ref >60.00)
Glucose, Bld: 85 mg/dL (ref 70–99)
Potassium: 4.4 mEq/L (ref 3.5–5.1)
Sodium: 131 mEq/L — ABNORMAL LOW (ref 135–145)
Total Bilirubin: 0.6 mg/dL (ref 0.2–1.2)
Total Protein: 6.5 g/dL (ref 6.0–8.3)

## 2018-12-16 LAB — CBC WITH DIFFERENTIAL/PLATELET
Basophils Absolute: 0.1 10*3/uL (ref 0.0–0.1)
Basophils Relative: 1.3 % (ref 0.0–3.0)
Eosinophils Absolute: 0.1 10*3/uL (ref 0.0–0.7)
Eosinophils Relative: 1.9 % (ref 0.0–5.0)
HCT: 40.1 % (ref 36.0–46.0)
Hemoglobin: 13.2 g/dL (ref 12.0–15.0)
LYMPHS ABS: 1.2 10*3/uL (ref 0.7–4.0)
Lymphocytes Relative: 24 % (ref 12.0–46.0)
MCHC: 33 g/dL (ref 30.0–36.0)
MCV: 89.1 fl (ref 78.0–100.0)
MONO ABS: 0.6 10*3/uL (ref 0.1–1.0)
Monocytes Relative: 11.4 % (ref 3.0–12.0)
Neutro Abs: 3.1 10*3/uL (ref 1.4–7.7)
Neutrophils Relative %: 61.4 % (ref 43.0–77.0)
Platelets: 203 10*3/uL (ref 150.0–400.0)
RBC: 4.5 Mil/uL (ref 3.87–5.11)
RDW: 15.1 % (ref 11.5–15.5)
WBC: 5 10*3/uL (ref 4.0–10.5)

## 2018-12-16 LAB — TSH: TSH: 7.97 u[IU]/mL — ABNORMAL HIGH (ref 0.35–4.50)

## 2018-12-16 LAB — T4: T4, Total: 7.3 ug/dL (ref 5.1–11.9)

## 2018-12-16 LAB — MAGNESIUM: Magnesium: 2.1 mg/dL (ref 1.5–2.5)

## 2018-12-16 NOTE — Telephone Encounter (Signed)
Vitals in.

## 2018-12-17 ENCOUNTER — Ambulatory Visit: Payer: Medicare HMO | Admitting: Medical

## 2018-12-17 ENCOUNTER — Ambulatory Visit (INDEPENDENT_AMBULATORY_CARE_PROVIDER_SITE_OTHER): Payer: Medicare HMO

## 2018-12-17 DIAGNOSIS — I442 Atrioventricular block, complete: Secondary | ICD-10-CM

## 2018-12-17 DIAGNOSIS — I5022 Chronic systolic (congestive) heart failure: Secondary | ICD-10-CM

## 2018-12-18 NOTE — Progress Notes (Signed)
Remote pacemaker transmission.   

## 2018-12-19 ENCOUNTER — Encounter: Payer: Self-pay | Admitting: Medical

## 2018-12-19 ENCOUNTER — Ambulatory Visit: Payer: Medicare HMO | Admitting: Medical

## 2018-12-19 ENCOUNTER — Ambulatory Visit (INDEPENDENT_AMBULATORY_CARE_PROVIDER_SITE_OTHER): Payer: Medicare HMO | Admitting: Medical

## 2018-12-19 VITALS — BP 109/105 | HR 81 | Temp 98.1°F | Resp 16 | Ht <= 58 in | Wt 94.8 lb

## 2018-12-19 DIAGNOSIS — J189 Pneumonia, unspecified organism: Secondary | ICD-10-CM

## 2018-12-19 DIAGNOSIS — R05 Cough: Secondary | ICD-10-CM | POA: Diagnosis not present

## 2018-12-19 DIAGNOSIS — R059 Cough, unspecified: Secondary | ICD-10-CM

## 2018-12-19 DIAGNOSIS — E871 Hypo-osmolality and hyponatremia: Secondary | ICD-10-CM

## 2018-12-19 LAB — CUP PACEART REMOTE DEVICE CHECK
Battery Remaining Longevity: 97 mo
Battery Remaining Percentage: 95.5 %
Battery Voltage: 2.98 V
Brady Statistic AP VP Percent: 39 %
Brady Statistic AP VS Percent: 1 %
Brady Statistic AS VP Percent: 61 %
Brady Statistic AS VS Percent: 1 %
Brady Statistic RA Percent Paced: 38 %
Brady Statistic RV Percent Paced: 99 %
Date Time Interrogation Session: 20200129070013
Implantable Lead Implant Date: 20170907
Implantable Lead Implant Date: 20170907
Implantable Lead Location: 753859
Implantable Lead Location: 753860
Implantable Pulse Generator Implant Date: 20170907
Lead Channel Impedance Value: 390 Ohm
Lead Channel Impedance Value: 450 Ohm
Lead Channel Pacing Threshold Amplitude: 0.5 V
Lead Channel Pacing Threshold Amplitude: 0.625 V
Lead Channel Pacing Threshold Pulse Width: 0.4 ms
Lead Channel Sensing Intrinsic Amplitude: 3.3 mV
Lead Channel Sensing Intrinsic Amplitude: 7.3 mV
Lead Channel Setting Pacing Amplitude: 2.5 V
Lead Channel Setting Pacing Pulse Width: 0.4 ms
Lead Channel Setting Sensing Sensitivity: 2 mV
MDC IDC MSMT LEADCHNL RV PACING THRESHOLD PULSEWIDTH: 0.4 ms
MDC IDC SET LEADCHNL RV PACING AMPLITUDE: 2.5 V
Pulse Gen Model: 2272
Pulse Gen Serial Number: 7944486

## 2018-12-19 MED ORDER — BENZONATATE 100 MG PO CAPS: 100.0000 mg | ORAL_CAPSULE | Freq: Three times a day (TID) | ORAL | 0 refills | Status: DC | PRN

## 2018-12-19 MED ORDER — SULFAMETHOXAZOLE-TRIMETHOPRIM 800-160 MG PO TABS: 1.0000 | ORAL_TABLET | Freq: Two times a day (BID) | ORAL | 0 refills | Status: DC

## 2018-12-19 NOTE — Patient Instructions (Signed)
You had early developing pneumonia on recent chest x-ray.  You appear stable today with some improvement of prior symptoms but still residual cough.  You just finished a azithromycin.  I do want to give you more antibiotics but limited to choices based on your side effects/allergy history.  Will give Bactrim DS to take twice daily.  I do want you to repeat a chest x-ray on this coming Wednesday.  Also I want you to check your sodium on Wednesday.  We will get metabolic panel and placing you on the schedule for 2 PM.  Recommend getting the labs first and getting chest x-ray.  You can be liberal with your salt use and try to drink 3 low sugar Gatorade's a day.  You do not need official appointment with me unless you clinically worsen between now and then.  Presently only need the labs and the x-ray.  We will update you on those results when they are in.

## 2018-12-19 NOTE — Progress Notes (Signed)
Subjective:    Patient ID: Tracey Holt, female    DOB: 09/14/1922, 85 y.o.   MRN: [id number]  HPI  Pt still has some cough(saw early in week and wanted to follow up after seeing cxr report). She has no fever, no chills or sweats. Her energy is good today. Yesterday as little tired. No wheezing or sob.  Pt did finish the zpack. Pt has limited antibiotic choices. She has allergy to pcn and levofloxin. In past pt has reluctance to use doxy. She thinks may have used and had side effects but can't remember exact side effects. She has taken bactrim with no allergy or side effects.  Pt xray did show   IMPRESSION: COPD changes with bibasilar atelectasis and suspect developing LEFT perihilar pneumonia.  Radiologist recommended repeat cxr in 10 days.  Pt sodium was mild low on last lab recheck.   Review of Systems  Constitutional: Negative for chills and fatigue.  HENT: Positive for congestion and sinus pressure.   Respiratory: Positive for cough. Negative for chest tightness, shortness of breath and wheezing.   Cardiovascular: Negative for chest pain and palpitations.  Gastrointestinal: Negative for abdominal pain.  Musculoskeletal: Negative for back pain.  Neurological: Negative for dizziness, seizures, speech difficulty, weakness, light-headedness and headaches.  Hematological: Negative for adenopathy. Does not bruise/bleed easily.  Psychiatric/Behavioral: Negative for behavioral problems and confusion.    Past Medical History:  Diagnosis Date  . Bradycardic cardiac arrest ([place]) 07/24/2016   Required CPR, intubation with multiple rib fractures. Had short term cardiac shock with acute heart failure.  Resulted in possible stress-induced cardiomyopathy  . Cardiac related syncope 07/24/2016   Bradycardic arrest requiring CPR 2 with transvenous pacemaker placed followed by permanent pacemaker; complicated by respiratory arrest, type II MI, stress-induced cardiopathy  . Cardiomyopathy  ([place]) 07/2016   Moderate severely reduced EF with apical hypokinesis suggestive of stress-induced cardiac myopathy versus multivessel CAD --> in setting of her to cardiac arrest  . COPD (chronic obstructive pulmonary disease) ([place])    By report  . History of chronic constipation   . Hypercholesterolemia   . Osteoporosis   . Pacemaker 07/26/2016   Abbott dual-chamber pacemaker: [employer] #[id number] - Serial [id number]  . Pneumonia 08/2016  . Sarcoidosis of lung ([place])   . Spinal stenosis      Social History   Socioeconomic History  . Marital status: Widowed    Spouse name: Not on file  . Number of children: Not on file  . Years of education: Not on file  . Highest education level: Not on file  Occupational History  . Not on file  Social Needs  . Financial resource strain: Not on file  . Food insecurity:    Worry: Not on file    Inability: Not on file  . Transportation needs:    Medical: Not on file    Non-medical: Not on file  Tobacco Use  . Smoking status: Former Smoker    Packs/day: 1.50    Years: 17.00    Pack years: 25.50    Types: Cigarettes    Start date: 12/18/1963    Last attempt to quit: 11/20/1983    Years since quitting: 35.1  . Smokeless tobacco: Never Used  Substance and Sexual Activity  . Alcohol use: No  . Drug use: No  . Sexual activity: Not on file  Lifestyle  . Physical activity:    Days per week: Not on file    Minutes per session: Not on  file  . Stress: Not on file  Relationships  . Social connections:    Talks on phone: Not on file    Gets together: Not on file    Attends religious service: Not on file    Active member of club or organization: Not on file    Attends meetings of clubs or organizations: Not on file    Relationship status: Not on file  . Intimate partner violence:    Fear of current or ex partner: Not on file    Emotionally abused: Not on file    Physically abused: Not on file    Forced sexual activity: Not on file    Other Topics Concern  . Not on file  Social History Narrative   Recently moved to [place] from [place] to live near her daughter.    Past Surgical History:  Procedure Laterality Date  . ADENOIDECTOMY    . PACEMAKER INSERTION Left 07/26/2016   Abbott Assurity Model #[id number] - Serial #[id number] -- complicated by pneumothorax requiring chest tube placement  . TEE WITHOUT CARDIOVERSION  02/2013   EF 55-60% with normal global function. No thrombus, mass or vegetation. Trivial-small pericardial effusion. Moderate LA dilation. Mild MR.  . TONSILLECTOMY    . TRANSTHORACIC ECHOCARDIOGRAM  07/24/2016   [facility]: EF 45-50%. Mid and apical inferior, inferolateral and apical hypokinesis. GR 2 DD. Mild LA and moderate RA dilation. Mild paracardial effusion. Moderate to severe TR. Basal and mid segment hypokinesis with apical hypokinesis. - Suspect stress-induced cardiopathy versus multivessel CAD.  Marland Kitchen TRANSTHORACIC ECHOCARDIOGRAM  07/26/2016   [place]: LIMITED (post PPM) - although the report suggested EF 30-35% with apical akinesis, rounding note suggested this was an improvement    Family History  Problem Relation Age of Onset  . Allergic rhinitis Neg Hx   . Angioedema Neg Hx   . Asthma Neg Hx   . Eczema Neg Hx   . Immunodeficiency Neg Hx   . Urticaria Neg Hx     Allergies  Allergen Reactions  . Dust Mite Extract Shortness Of Breath  . Levaquin [Levofloxacin In D5w] Shortness Of Breath  . Lidex [Fluocinonide] Swelling    Caused lip swelling  . Molds & Smuts Shortness Of Breath  . Rifampin Shortness Of Breath  . Lactose Other (See Comments)    unknown  . Penicillins Swelling    Did it involve swelling of the face/tongue/throat, SOB, or low BP? No Did it involve sudden or severe rash/hives, skin peeling, or any reaction on the inside of your mouth or nose? Unknown Did you need to seek medical attention at a hospital or  doctor's office? Unknown When did it last happen?unk If all above answers are "NO", may proceed with cephalosporin use.     Current Outpatient Medications on File Prior to Visit  Medication Sig Dispense Refill  . acetaminophen (TYLENOL) 500 MG tablet Take 500-1,000 mg by mouth every 6 (six) hours as needed for moderate pain or headache.    . albuterol (PROVENTIL HFA;VENTOLIN HFA) 108 (90 Base) MCG/ACT inhaler Inhale 2 puffs into the lungs every 6 (six) hours as needed for wheezing or shortness of breath. 1 Inhaler 2  . amLODipine (NORVASC) 2.5 MG tablet TAKE 1 TABLET BY MOUTH EVERY DAY (Patient taking differently: Take 2.5 mg by mouth daily. ) 90 tablet 1  . Ascorbic Acid (VITAMIN C) 1000 MG tablet Take 1,000 mg by mouth daily.    Marland Kitchen atorvastatin (LIPITOR) 10 MG  tablet Take 1/2 tablet (5mg ) by mouth daily (Patient taking differently: Take 5 mg by mouth daily at 6 PM. ) 45 tablet 3  . benzonatate (TESSALON) 100 MG capsule Take 1 capsule (100 mg total) by mouth 3 (three) times daily as needed for cough. 30 capsule 0  . Biotin 10000 MCG TABS Take 10,000 mcg by mouth daily.     . Calcium Citrate-Vitamin D (CITRACAL + D PO) Take 1 tablet by mouth 2 (two) times daily.    . Coenzyme Q10 (CO Q 10) 100 MG CAPS Take 100 mg by mouth daily.    Marland Kitchen docusate sodium (COLACE) 100 MG capsule Take 100 mg by mouth daily as needed for mild constipation.     . fluticasone (FLONASE) 50 MCG/ACT nasal spray Place 1 spray into both nostrils daily as needed for allergies.     . hydroxypropyl methylcellulose / hypromellose (ISOPTO TEARS / GONIOVISC) 2.5 % ophthalmic solution Place 1 drop into both eyes 3 (three) times daily.     Marland Kitchen ipratropium (ATROVENT HFA) 17 MCG/ACT inhaler Inhale 2 puffs into the lungs every 6 (six) hours as needed for wheezing. 1 Inhaler 2  . levocetirizine (XYZAL) 5 MG tablet Take 5 mg by mouth every evening.    . Misc Natural Products (GLUCOS-CHONDROIT-MSM COMPLEX) TABS Take 2 tablets by mouth  daily.    . Multiple Vitamins-Minerals (MULTIVITAMIN WITH MINERALS) tablet Take 1 tablet by mouth daily.    . mupirocin cream (BACTROBAN) 2 % Apply 1 application topically 2 (two) times daily. 30 g 0  . Probiotic CAPS Take 1 capsule by mouth daily.     No current facility-administered medications on file prior to visit.     BP (!) 109/105   Pulse 81   Temp 98.1 F (36.7 C) (Oral)   Resp 16   Ht 4\' 9"  (1.448 m)   Wt 94 lb 12.8 oz (43 kg)   SpO2 94%   BMI 20.51 kg/m       Objective:   Physical Exam  General  Mental Status - Alert. General Appearance - Well groomed. Not in acute distress.  Skin Rashes- No Rashes.  HEENT Head- Normal. Ear Auditory Canal - Left- Normal. Right - Normal.Tympanic Membrane- Left- Normal. Right- Normal. Eye Sclera/Conjunctiva- Left- Normal. Right- Normal. Nose & Sinuses Nasal Mucosa- Left-  Boggy and Congested. Right-  Boggy and  Congested.Bilateral  Faint maxillary but no frontal sinus pressure. Mouth & Throat Lips: Upper Lip- Normal: no dryness, cracking, pallor, cyanosis, or vesicular eruption. Lower Lip-Normal: no dryness, cracking, pallor, cyanosis or vesicular eruption. Buccal Mucosa- Bilateral- No Aphthous ulcers. Oropharynx- No Discharge or Erythema. Tonsils: Characteristics- Bilateral- No Erythema or Congestion. Size/Enlargement- Bilateral- No enlargement. Discharge- bilateral-None.  Neck Neck- Supple. No Masses.   Chest and Lung Exam Auscultation: Breath Sounds:-Clear even and unlabored.  Cardiovascular Auscultation:Rythm- Regular, rate and rhythm. Murmurs & Other Heart Sounds:Ausculatation of the heart reveal- No Murmurs.  Lymphatic Head & Neck General Head & Neck Lymphatics: Bilateral: Description- No Localized lymphadenopathy.       Assessment & Plan:  You had early developing pneumonia on recent chest x-ray.  You appear stable today with some improvement of prior symptoms but still residual cough.  You just finished  a azithromycin.  I do want to give you more antibiotics but limited to choices based on your side effects/allergy history.  Will give Bactrim DS to take twice daily.  I do want you to repeat a chest x-ray on this coming Wednesday.  Also I want you to check your sodium on Wednesday.  We will get metabolic panel and placing you on the schedule for 2 PM.  Recommend getting the labs first and getting chest x-ray.  You can be liberal with your salt use and try to drink 3 low sugar Gatorade's a day.  You do not need official appointment with me unless you clinically worsen between now and then.  Presently only need the labs and the x-ray.  We will update you on those results when they are in.

## 2018-12-22 ENCOUNTER — Ambulatory Visit: Payer: Self-pay

## 2018-12-22 NOTE — Telephone Encounter (Signed)
Pt c/o mild to moderate diarrhea yesterday. No diarrhea today. Pt is on an antibiotic. Pt stated that one time she had a crampy pain to her abdomen -none since. Pt is eating and drinking and denies dry mouth, dizziness or weakness. Home care advice given and pt verbalized understanding.   Reason for Disposition . [1] MILD diarrhea AND [2] taking antibiotics  Answer Assessment - Initial Assessment Questions 1. DIARRHEA SEVERITY: "How bad is the diarrhea?" "How many extra stools have you had in the past 24 hours than normal?"    - MILD (Scale 1-3; CTCAE Grade 1): Few loose or mushy BMs; increase of 1-3 stools over normal daily number of stools; mild increase in ostomy output.   - MODERATE (Scale 4-7; CTCAE Grade 2): Increase of 4-6 stools daily over normal; moderate increase in ostomy output.   - SEVERE (Scale 8-10; or worst possible; CTCAE Grade 3): Increase of 7 or more stools daily over normal; moderate increase in ostomy output; incontinence.     moderate 2. ONSET: "When did the diarrhea begin?"      yesterday 3. BM CONSISTENCY: "How loose or watery is the diarrhea?" "Is there any blood in the stool?"     Watery at first then loose 4. BM ODOR: "Does the stool smell worse or different than usual?"     no 5. VOMITING: "Are you also vomiting?" If so, ask: "How many times in the past 24 hours?"      no 6. ABDOMINAL PAIN: "Are you having any abdominal pain?" If yes: "What does it feel like?" (e.g., crampy, dull, intermittent, constant)      One time crampy pain 7. ABDOMINAL PAIN SEVERITY: If present, ask: "How bad is the pain?"  (e.g., Scale 1-10; mild, moderate, or severe)    - MILD (1-3): doesn't interfere with normal activities, abdomen soft and not tender to touch     - MODERATE (4-7): interferes with normal activities or awakens from sleep, tender to touch     - SEVERE (8-10): excruciating pain, doubled over, unable to do any normal activities       No pain  8. ORAL INTAKE: If vomiting,  "Have you been able to drink liquids?" "How much fluids have you had in the past 24 hours?"     n/a 9. HYDRATION: "Any signs of dehydration?" (e.g., dry mouth [not just dry lips], too weak to stand, dizziness, new weight loss) "When did you last urinate?"     No- this morning 10. EXPOSURE: "Have you traveled to a foreign country recently?" "Have you been exposed to anyone with diarrhea?" "Could you have eaten any food that was spoiled?"       No-no-no 11. CANCER: "What type of cancer do you have?"       *No Answer* 12. CANCER - TREATMENT: "What cancer treatments have you received?" "When did you last take or receive them?" (e.g., recent surgery, radiation, immunotherapy, or chemotherapy). If triager has access to the patient's medical record, triager should review treatments and administration dates.       *No Answer* 13. CANCER - NEUTROPENIA RISK: "Have you received chemotherapy recently? If Yes, "When was it and what was your last CBC and ANC (absolute neutrophil count)?" "Were you told that your white cell count was low?" If triager has access to the patient's medical record, triager should review most recent labs. An ANC less than 1,000 - 1,500 means that the neutrophils are low and the immune system is weak.       *  No Answer* 14. C DIFF RSK: "Have you ever had c-difficile (C-Diff) diarrhea?"        *No Answer* 15. DIARRHEA MEDICINES: "Are you taking any medicines right now to make the diarrhea better?" If yes, "What drugs are you taking?" (e.g., Immodium, Lomotil)       *No Answer* 14. OTHER SYMPTOMS: "Do you have any other symptoms?" (e.g., fever, blood in stool)       *No Answer*  Answer Assessment - Initial Assessment Questions 1. DIARRHEA SEVERITY: "How bad is the diarrhea?" "How many extra stools have you had in the past 24 hours than normal?"    - NO DIARRHEA (SCALE 0)   - MILD (SCALE 1-3): Few loose or mushy BMs; increase of 1-3 stools over normal daily number of stools; mild  increase in ostomy output.   -  MODERATE (SCALE 4-7): Increase of 4-6 stools daily over normal; moderate increase in ostomy output. * SEVERE (SCALE 8-10; OR 'WORST POSSIBLE'): Increase of 7 or more stools daily over normal; moderate increase in ostomy output; incontinence.     Mild to moderate- pt didn't know exact amount 2. ONSET: "When did the diarrhea begin?"      yesterday 3. BM CONSISTENCY: "How loose or watery is the diarrhea?"       4. VOMITING: "Are you also vomiting?" If so, ask: "How many times in the past 24 hours?"      no 5. ABDOMINAL PAIN: "Are you having any abdominal pain?" If yes: "What does it feel like?" (e.g., crampy, dull, intermittent, constant)      no 6. ABDOMINAL PAIN SEVERITY: If present, ask: "How bad is the pain?"  (e.g., Scale 1-10; mild, moderate, or severe)   - MILD (1-3): doesn't interfere with normal activities, abdomen soft and not tender to touch    - MODERATE (4-7): interferes with normal activities or awakens from sleep, tender to touch    - SEVERE (8-10): excruciating pain, doubled over, unable to do any normal activities       n/a 7. ORAL INTAKE: If vomiting, "Have you been able to drink liquids?" "How much fluids have you had in the past 24 hours?"     no 8. HYDRATION: "Any signs of dehydration?" (e.g., dry mouth [not just dry lips], too weak to stand, dizziness, new weight loss) "When did you last urinate?"     no 9. EXPOSURE: "Have you traveled to a foreign country recently?" "Have you been exposed to anyone with diarrhea?" "Could you have eaten any food that was spoiled?"     No-no-no 10. ANTIBIOTIC USE: "Are you taking antibiotics now or have you taken antibiotics in the past 2 months?"       bactrim 11. OTHER SYMPTOMS: "Do you have any other symptoms?" (e.g., fever, blood in stool)       no 12. PREGNANCY: "Is there any chance you are pregnant?" "When was your last menstrual period?"       n/a  Protocols used: DIARRHEA-A-AH, CANCER -  DIARRHEA-A-AH

## 2018-12-23 ENCOUNTER — Inpatient Hospital Stay (HOSPITAL_COMMUNITY): Payer: Medicare HMO

## 2018-12-23 ENCOUNTER — Other Ambulatory Visit: Payer: Self-pay

## 2018-12-23 ENCOUNTER — Encounter (HOSPITAL_COMMUNITY): Payer: Self-pay | Admitting: Emergency Medicine

## 2018-12-23 ENCOUNTER — Emergency Department (HOSPITAL_COMMUNITY): Payer: Medicare HMO

## 2018-12-23 ENCOUNTER — Inpatient Hospital Stay (HOSPITAL_COMMUNITY)
Admission: EM | Admit: 2018-12-23 | Discharge: 2018-12-25 | DRG: 640 | Disposition: A | Discharge status: Home / Self Care | Payer: Medicare HMO | Source: Skilled Nursing Facility | Attending: Internal Medicine | Admitting: Internal Medicine

## 2018-12-23 DIAGNOSIS — J691 Pneumonitis due to inhalation of oils and essences: Secondary | ICD-10-CM | POA: Diagnosis not present

## 2018-12-23 DIAGNOSIS — R197 Diarrhea, unspecified: Secondary | ICD-10-CM | POA: Diagnosis present

## 2018-12-23 DIAGNOSIS — J449 Chronic obstructive pulmonary disease, unspecified: Secondary | ICD-10-CM | POA: Diagnosis not present

## 2018-12-23 DIAGNOSIS — E785 Hyperlipidemia, unspecified: Secondary | ICD-10-CM | POA: Diagnosis present

## 2018-12-23 DIAGNOSIS — R0682 Tachypnea, not elsewhere classified: Secondary | ICD-10-CM | POA: Diagnosis not present

## 2018-12-23 DIAGNOSIS — D860 Sarcoidosis of lung: Secondary | ICD-10-CM | POA: Diagnosis present

## 2018-12-23 DIAGNOSIS — R109 Unspecified abdominal pain: Secondary | ICD-10-CM

## 2018-12-23 DIAGNOSIS — Z888 Allergy status to other drugs, medicaments and biological substances status: Secondary | ICD-10-CM | POA: Diagnosis not present

## 2018-12-23 DIAGNOSIS — I071 Rheumatic tricuspid insufficiency: Secondary | ICD-10-CM | POA: Diagnosis present

## 2018-12-23 DIAGNOSIS — E876 Hypokalemia: Secondary | ICD-10-CM | POA: Diagnosis present

## 2018-12-23 DIAGNOSIS — R339 Retention of urine, unspecified: Secondary | ICD-10-CM | POA: Diagnosis not present

## 2018-12-23 DIAGNOSIS — Z950 Presence of cardiac pacemaker: Secondary | ICD-10-CM | POA: Diagnosis present

## 2018-12-23 DIAGNOSIS — G9341 Metabolic encephalopathy: Secondary | ICD-10-CM | POA: Diagnosis present

## 2018-12-23 DIAGNOSIS — M810 Age-related osteoporosis without current pathological fracture: Secondary | ICD-10-CM | POA: Diagnosis present

## 2018-12-23 DIAGNOSIS — I429 Cardiomyopathy, unspecified: Secondary | ICD-10-CM | POA: Diagnosis present

## 2018-12-23 DIAGNOSIS — R9389 Abnormal findings on diagnostic imaging of other specified body structures: Secondary | ICD-10-CM | POA: Diagnosis present

## 2018-12-23 DIAGNOSIS — Z8674 Personal history of sudden cardiac arrest: Secondary | ICD-10-CM | POA: Diagnosis not present

## 2018-12-23 DIAGNOSIS — E871 Hypo-osmolality and hyponatremia: Secondary | ICD-10-CM | POA: Diagnosis not present

## 2018-12-23 DIAGNOSIS — R1013 Epigastric pain: Secondary | ICD-10-CM | POA: Diagnosis not present

## 2018-12-23 DIAGNOSIS — R0602 Shortness of breath: Secondary | ICD-10-CM | POA: Diagnosis not present

## 2018-12-23 DIAGNOSIS — Z880 Allergy status to penicillin: Secondary | ICD-10-CM | POA: Diagnosis not present

## 2018-12-23 DIAGNOSIS — M19012 Primary osteoarthritis, left shoulder: Secondary | ICD-10-CM | POA: Diagnosis not present

## 2018-12-23 DIAGNOSIS — H532 Diplopia: Secondary | ICD-10-CM | POA: Diagnosis present

## 2018-12-23 DIAGNOSIS — I5022 Chronic systolic (congestive) heart failure: Secondary | ICD-10-CM | POA: Diagnosis present

## 2018-12-23 DIAGNOSIS — J189 Pneumonia, unspecified organism: Secondary | ICD-10-CM | POA: Diagnosis not present

## 2018-12-23 DIAGNOSIS — Z881 Allergy status to other antibiotic agents status: Secondary | ICD-10-CM

## 2018-12-23 DIAGNOSIS — R4182 Altered mental status, unspecified: Secondary | ICD-10-CM | POA: Diagnosis not present

## 2018-12-23 DIAGNOSIS — E860 Dehydration: Secondary | ICD-10-CM | POA: Diagnosis present

## 2018-12-23 DIAGNOSIS — K5909 Other constipation: Secondary | ICD-10-CM | POA: Diagnosis present

## 2018-12-23 DIAGNOSIS — R410 Disorientation, unspecified: Secondary | ICD-10-CM | POA: Diagnosis not present

## 2018-12-23 DIAGNOSIS — R000 Tachycardia, unspecified: Secondary | ICD-10-CM | POA: Diagnosis not present

## 2018-12-23 DIAGNOSIS — M419 Scoliosis, unspecified: Secondary | ICD-10-CM | POA: Diagnosis present

## 2018-12-23 DIAGNOSIS — I110 Hypertensive heart disease with heart failure: Secondary | ICD-10-CM | POA: Diagnosis not present

## 2018-12-23 DIAGNOSIS — Z87891 Personal history of nicotine dependence: Secondary | ICD-10-CM | POA: Diagnosis not present

## 2018-12-23 DIAGNOSIS — J441 Chronic obstructive pulmonary disease with (acute) exacerbation: Secondary | ICD-10-CM | POA: Diagnosis present

## 2018-12-23 DIAGNOSIS — K5900 Constipation, unspecified: Secondary | ICD-10-CM | POA: Diagnosis not present

## 2018-12-23 DIAGNOSIS — E7800 Pure hypercholesterolemia, unspecified: Secondary | ICD-10-CM | POA: Diagnosis present

## 2018-12-23 DIAGNOSIS — Z79899 Other long term (current) drug therapy: Secondary | ICD-10-CM

## 2018-12-23 DIAGNOSIS — R52 Pain, unspecified: Secondary | ICD-10-CM | POA: Diagnosis not present

## 2018-12-23 LAB — BASIC METABOLIC PANEL
Anion gap: 12 (ref 5–15)
Anion gap: 9 (ref 5–15)
Anion gap: 10 (ref 5–15)
Anion gap: 8 (ref 5–15)
Anion gap: 7 (ref 5–15)
BUN: 8 mg/dL (ref 8–23)
BUN: 7 mg/dL — ABNORMAL LOW (ref 8–23)
BUN: 6 mg/dL — ABNORMAL LOW (ref 8–23)
BUN: 6 mg/dL — ABNORMAL LOW (ref 8–23)
BUN: 6 mg/dL — ABNORMAL LOW (ref 8–23)
CO2: 20 mmol/L — ABNORMAL LOW (ref 22–32)
CO2: 23 mmol/L (ref 22–32)
CO2: 19 mmol/L — ABNORMAL LOW (ref 22–32)
CO2: 23 mmol/L (ref 22–32)
CO2: 22 mmol/L (ref 22–32)
CREATININE: 0.36 mg/dL — AB (ref 0.44–1.00)
Calcium: 8.1 mg/dL — ABNORMAL LOW (ref 8.9–10.3)
Calcium: 7.9 mg/dL — ABNORMAL LOW (ref 8.9–10.3)
Calcium: 7.6 mg/dL — ABNORMAL LOW (ref 8.9–10.3)
Calcium: 8.2 mg/dL — ABNORMAL LOW (ref 8.9–10.3)
Calcium: 8.3 mg/dL — ABNORMAL LOW (ref 8.9–10.3)
Chloride: 84 mmol/L — ABNORMAL LOW (ref 98–111)
Chloride: 86 mmol/L — ABNORMAL LOW (ref 98–111)
Chloride: 89 mmol/L — ABNORMAL LOW (ref 98–111)
Chloride: 90 mmol/L — ABNORMAL LOW (ref 98–111)
Chloride: 98 mmol/L (ref 98–111)
Creatinine, Ser: 0.46 mg/dL (ref 0.44–1.00)
Creatinine, Ser: 0.42 mg/dL — ABNORMAL LOW (ref 0.44–1.00)
Creatinine, Ser: 0.38 mg/dL — ABNORMAL LOW (ref 0.44–1.00)
Creatinine, Ser: 0.33 mg/dL — ABNORMAL LOW (ref 0.44–1.00)
GFR calc Af Amer: >60 mL/min (ref >60)
GFR calc Af Amer: >60 mL/min (ref >60)
GFR calc Af Amer: >60 mL/min (ref >60)
GFR calc Af Amer: >60 mL/min (ref >60)
GFR calc non Af Amer: >60 mL/min (ref >60)
GFR calc non Af Amer: >60 mL/min (ref >60)
GFR calc non Af Amer: >60 mL/min (ref >60)
GFR calc non Af Amer: >60 mL/min (ref >60)
GFR calc non Af Amer: >60 mL/min (ref >60)
Glucose, Bld: 163 mg/dL — ABNORMAL HIGH (ref 70–99)
Glucose, Bld: 193 mg/dL — ABNORMAL HIGH (ref 70–99)
Glucose, Bld: 165 mg/dL — ABNORMAL HIGH (ref 70–99)
Glucose, Bld: 155 mg/dL — ABNORMAL HIGH (ref 70–99)
Glucose, Bld: 155 mg/dL — ABNORMAL HIGH (ref 70–99)
POTASSIUM: 4.1 mmol/L (ref 3.5–5.1)
Potassium: 3 mmol/L — ABNORMAL LOW (ref 3.5–5.1)
Potassium: 3.4 mmol/L — ABNORMAL LOW (ref 3.5–5.1)
Potassium: 6.3 mmol/L (ref 3.5–5.1)
Potassium: 4.3 mmol/L (ref 3.5–5.1)
SODIUM: 118 mmol/L — AB (ref 135–145)
Sodium: 116 mmol/L — CL (ref 135–145)
Sodium: 118 mmol/L — CL (ref 135–145)
Sodium: 121 mmol/L — ABNORMAL LOW (ref 135–145)
Sodium: 127 mmol/L — ABNORMAL LOW (ref 135–145)

## 2018-12-23 LAB — URINALYSIS, ROUTINE W REFLEX MICROSCOPIC
Bilirubin Urine: NEGATIVE
Glucose, UA: 150 mg/dL — AB
Hgb urine dipstick: NEGATIVE
KETONES UR: 5 mg/dL — AB
Leukocytes, UA: NEGATIVE
Nitrite: NEGATIVE
Protein, ur: NEGATIVE mg/dL
Specific Gravity, Urine: 1.025 (ref 1.005–1.030)
pH: 6 (ref 5.0–8.0)

## 2018-12-23 LAB — URIC ACID: URIC ACID, SERUM: 2 mg/dL — AB (ref 2.5–7.1)

## 2018-12-23 LAB — CBC
HCT: 33.2 % — ABNORMAL LOW (ref 36.0–46.0)
Hemoglobin: 11.3 g/dL — ABNORMAL LOW (ref 12.0–15.0)
MCH: 29.9 pg (ref 26.0–34.0)
MCHC: 34 g/dL (ref 30.0–36.0)
MCV: 87.8 fL (ref 80.0–100.0)
Platelets: 186 10*3/uL (ref 150–400)
RBC: 3.78 MIL/uL — ABNORMAL LOW (ref 3.87–5.11)
RDW: 13.8 % (ref 11.5–15.5)
WBC: 9.1 10*3/uL (ref 4.0–10.5)
nRBC: 0 % (ref 0.0–0.2)

## 2018-12-23 LAB — OSMOLALITY: OSMOLALITY: 251 mosm/kg — AB (ref 275–295)

## 2018-12-23 LAB — NA AND K (SODIUM & POTASSIUM), RAND UR
Potassium Urine: 23 mmol/L
SODIUM UR: 33 mmol/L

## 2018-12-23 LAB — TSH: TSH: 4.139 u[IU]/mL (ref 0.350–4.500)

## 2018-12-23 LAB — BRAIN NATRIURETIC PEPTIDE: B Natriuretic Peptide: 113.7 pg/mL — ABNORMAL HIGH (ref 0.0–100.0)

## 2018-12-23 LAB — CORTISOL-AM, BLOOD: Cortisol - AM: 31.8 ug/dL — ABNORMAL HIGH (ref 6.7–22.6)

## 2018-12-23 LAB — OSMOLALITY, URINE: Osmolality, Ur: 301 mOsm/kg (ref 300–900)

## 2018-12-23 LAB — MRSA PCR SCREENING: MRSA by PCR: NEGATIVE

## 2018-12-23 LAB — TROPONIN I: Troponin I: <0.03 ng/mL (ref <0.03)

## 2018-12-23 MED ORDER — ONDANSETRON HCL 4 MG/2ML IJ SOLN: 4.0000 mg | Freq: Four times a day (QID) | INTRAMUSCULAR | Status: DC | PRN

## 2018-12-23 MED ORDER — POTASSIUM CHLORIDE 10 MEQ/100ML IV SOLN
10.0000 meq | INTRAVENOUS | Status: AC
  Administered 2018-12-23 (×4): 10 meq via INTRAVENOUS
  Filled 2018-12-23 (×5): qty 100

## 2018-12-23 MED ORDER — LORAZEPAM 2 MG/ML IJ SOLN
0.5000 mg | Freq: Three times a day (TID) | INTRAMUSCULAR | Status: DC | PRN
  Administered 2018-12-24: 0.5 mg via INTRAVENOUS
  Filled 2018-12-23: qty 1

## 2018-12-23 MED ORDER — ONDANSETRON HCL 4 MG PO TABS: 4.0000 mg | ORAL_TABLET | Freq: Four times a day (QID) | ORAL | Status: DC | PRN

## 2018-12-23 MED ORDER — IOPAMIDOL (ISOVUE-370) INJECTION 76%
INTRAVENOUS | Status: AC
  Filled 2018-12-23: qty 100

## 2018-12-23 MED ORDER — ORAL CARE MOUTH RINSE
15.0000 mL | Freq: Two times a day (BID) | OROMUCOSAL | Status: DC
  Administered 2018-12-23 – 2018-12-25 (×4): 15 mL via OROMUCOSAL

## 2018-12-23 MED ORDER — IOPAMIDOL (ISOVUE-370) INJECTION 76%
100.0000 mL | Freq: Once | INTRAVENOUS | Status: AC | PRN
  Administered 2018-12-23: 100 mL via INTRAVENOUS

## 2018-12-23 MED ORDER — ATORVASTATIN CALCIUM 10 MG PO TABS
5.0000 mg | ORAL_TABLET | Freq: Every day | ORAL | Status: DC
  Administered 2018-12-24: 5 mg via ORAL
  Filled 2018-12-23: qty 1

## 2018-12-23 MED ORDER — ACETAMINOPHEN 325 MG PO TABS: 650.0000 mg | ORAL_TABLET | Freq: Four times a day (QID) | ORAL | Status: DC | PRN

## 2018-12-23 MED ORDER — ENOXAPARIN SODIUM 30 MG/0.3ML ~~LOC~~ SOLN
30.0000 mg | SUBCUTANEOUS | Status: DC
  Administered 2018-12-23 – 2018-12-24 (×2): 30 mg via SUBCUTANEOUS
  Filled 2018-12-23 (×2): qty 0.3

## 2018-12-23 MED ORDER — ALBUTEROL SULFATE (2.5 MG/3ML) 0.083% IN NEBU: 2.5000 mg | INHALATION_SOLUTION | RESPIRATORY_TRACT | Status: DC | PRN

## 2018-12-23 MED ORDER — BENZONATATE 100 MG PO CAPS
100.0000 mg | ORAL_CAPSULE | Freq: Three times a day (TID) | ORAL | Status: DC | PRN
  Administered 2018-12-24: 100 mg via ORAL
  Filled 2018-12-23: qty 1

## 2018-12-23 MED ORDER — POLYVINYL ALCOHOL 1.4 % OP SOLN
1.0000 [drp] | Freq: Three times a day (TID) | OPHTHALMIC | Status: DC
  Administered 2018-12-23 – 2018-12-24 (×4): 1 [drp] via OPHTHALMIC
  Filled 2018-12-23: qty 15

## 2018-12-23 MED ORDER — SODIUM CHLORIDE 0.9 % IV BOLUS
500.0000 mL | Freq: Once | INTRAVENOUS | Status: AC
  Administered 2018-12-23: 500 mL via INTRAVENOUS

## 2018-12-23 MED ORDER — MORPHINE SULFATE (PF) 4 MG/ML IV SOLN
4.0000 mg | Freq: Once | INTRAVENOUS | Status: AC
  Administered 2018-12-23: 4 mg via INTRAVENOUS
  Filled 2018-12-23: qty 1

## 2018-12-23 MED ORDER — METHYLPREDNISOLONE SODIUM SUCC 40 MG IJ SOLR
40.0000 mg | Freq: Two times a day (BID) | INTRAMUSCULAR | Status: DC
  Administered 2018-12-23 – 2018-12-24 (×2): 40 mg via INTRAVENOUS
  Filled 2018-12-23 (×2): qty 1

## 2018-12-23 MED ORDER — LORAZEPAM 2 MG/ML IJ SOLN
0.5000 mg | Freq: Once | INTRAMUSCULAR | Status: AC
  Administered 2018-12-23: 0.5 mg via INTRAVENOUS
  Filled 2018-12-23: qty 1

## 2018-12-23 MED ORDER — IPRATROPIUM-ALBUTEROL 0.5-2.5 (3) MG/3ML IN SOLN
3.0000 mL | Freq: Three times a day (TID) | RESPIRATORY_TRACT | Status: DC
  Administered 2018-12-24 – 2018-12-25 (×5): 3 mL via RESPIRATORY_TRACT
  Filled 2018-12-23 (×5): qty 3

## 2018-12-23 MED ORDER — ACETAMINOPHEN 650 MG RE SUPP: 650.0000 mg | Freq: Four times a day (QID) | RECTAL | Status: DC | PRN

## 2018-12-23 MED ORDER — LORAZEPAM 2 MG/ML IJ SOLN: 0.5000 mg | Freq: Two times a day (BID) | INTRAMUSCULAR | Status: DC | PRN

## 2018-12-23 MED ORDER — SODIUM CHLORIDE (PF) 0.9 % IJ SOLN
INTRAMUSCULAR | Status: AC
  Filled 2018-12-23: qty 50

## 2018-12-23 MED ORDER — SODIUM CHLORIDE 0.9 % IV SOLN
INTRAVENOUS | Status: DC
  Administered 2018-12-23 – 2018-12-24 (×3): via INTRAVENOUS

## 2018-12-23 MED ORDER — IPRATROPIUM-ALBUTEROL 0.5-2.5 (3) MG/3ML IN SOLN
3.0000 mL | Freq: Four times a day (QID) | RESPIRATORY_TRACT | Status: DC
  Administered 2018-12-23 (×2): 3 mL via RESPIRATORY_TRACT
  Filled 2018-12-23 (×2): qty 3

## 2018-12-23 NOTE — ED Notes (Signed)
PO WATER GIVEN. TOLERATED

## 2018-12-23 NOTE — Consult Note (Addendum)
[facility] Nephrology Consultation Note  Requesting MD: Dirk Dress ER and Dr. Tyrell Antonio Reason for consult: Hyponatremia  HPI:  Tracey Holt is a 85 y.o. female.  With history of bradycardia, syncope, cardiac arrest status post pacemaker, CHF with EF of 45 to 50%, COPD, hypertension presented from assisted living facility for the evaluation of shortness of breath.   Patient has been treated with multiple course of antibiotics for pneumonia.  Reportedly patient was recently started on Bactrim for the management of left lower lobe pneumonia.  In the ER patient was found to have serum sodium level of 116, potassium 3, creatinine 0.36.  Nephrology was consulted for the evaluation of hyponatremia.  Serum sodium level was 131 on 12/15/2018. On reviewing chart, patient was admitted in September 2019 when serum sodium level was low at 117, thought to be due to dehydration and low solute intake.  The serum sodium level improved with normal saline hydration.  On medicine review, patient is not on diuretics.  No known diarrhea. In the ER patient was agitated therefore she received Ativan.  Patient is currently somnolent and calm.  Review of system is limited. Patient received about a liter of normal saline and potassium chloride IV.   Creat  Date/Time Value Ref Range Status  09/12/2018 02:26 PM 0.68 0.60 - 0.88 mg/dL Final    Comment:    For patients >85 years of age, the reference limit for Creatinine is approximately 13% higher for people identified as African-American. Marland Kitchen   12/17/2016 03:54 PM 0.57 (L) 0.60 - 0.88 mg/dL Final    Comment:      For patients > or = 83 years of age: The upper reference limit for Creatinine is approximately 13% higher for people identified as African-American.      Creatinine, Ser  Date/Time Value Ref Range Status  12/23/2018 08:11 AM 0.36 (L) 0.44 - 1.00 mg/dL Final  12/15/2018 04:01 PM 0.61 0.40 - 1.20 mg/dL Final  12/07/2018 05:26 AM 0.48 0.44 - 1.00  mg/dL Final  12/06/2018 11:08 PM 0.57 0.44 - 1.00 mg/dL Final  12/06/2018 02:03 PM 0.49 0.44 - 1.00 mg/dL Final  12/06/2018 09:11 AM 0.54 0.44 - 1.00 mg/dL Final  12/06/2018 05:38 AM 0.57 0.44 - 1.00 mg/dL Final  10/25/2018 09:39 AM 0.52 0.44 - 1.00 mg/dL Final  08/22/2018 01:48 PM 0.66 0.40 - 1.20 mg/dL Final  08/15/2018 05:06 AM 0.47 0.44 - 1.00 mg/dL Final  08/14/2018 11:08 AM 0.51 0.44 - 1.00 mg/dL Final  08/14/2018 05:44 AM 0.50 0.44 - 1.00 mg/dL Final  08/14/2018 02:32 AM 0.61 0.44 - 1.00 mg/dL Final  08/13/2018 10:22 PM 0.52 0.44 - 1.00 mg/dL Final  08/13/2018 06:33 PM 0.50 0.44 - 1.00 mg/dL Final  08/13/2018 02:59 PM 0.51 0.44 - 1.00 mg/dL Final  08/13/2018 09:48 AM 0.48 0.44 - 1.00 mg/dL Final  08/13/2018 04:07 AM 0.50 0.44 - 1.00 mg/dL Final  05/26/2018 03:27 PM 0.69 0.40 - 1.20 mg/dL Final  04/16/2018 02:57 PM 0.54 0.40 - 1.20 mg/dL Final  04/08/2018 05:57 AM 0.38 (L) 0.44 - 1.00 mg/dL Final  04/06/2018 10:36 PM 0.36 (L) 0.44 - 1.00 mg/dL Final  01/08/2018 02:05 PM 0.63 0.40 - 1.20 mg/dL Final  01/02/2018 02:21 PM 0.58 0.40 - 1.20 mg/dL Final  12/04/2017 12:51 PM 0.56 0.40 - 1.20 mg/dL Final  09/19/2017 02:50 PM 0.57 0.40 - 1.20 mg/dL Final  10/03/2016 02:33 PM 0.76 0.40 - 1.20 mg/dL Final  09/11/2016 08:57 PM 0.83 0.44 - 1.00 mg/dL Final  PMHx:   Past Medical History:  Diagnosis Date  . Bradycardic cardiac arrest ([place]) 07/24/2016   Required CPR, intubation with multiple rib fractures. Had short term cardiac shock with acute heart failure.  Resulted in possible stress-induced cardiomyopathy  . Cardiac related syncope 07/24/2016   Bradycardic arrest requiring CPR 2 with transvenous pacemaker placed followed by permanent pacemaker; complicated by respiratory arrest, type II MI, stress-induced cardiopathy  . Cardiomyopathy ([place]) 07/2016   Moderate severely reduced EF with apical hypokinesis suggestive of stress-induced cardiac myopathy versus multivessel CAD --> in  setting of her to cardiac arrest  . COPD (chronic obstructive pulmonary disease) ([place])    By report  . History of chronic constipation   . Hypercholesterolemia   . Osteoporosis   . Pacemaker 07/26/2016   Abbott dual-chamber pacemaker: [employer]acket #[id number] - Serial [id number]  . Pneumonia 08/2016  . Sarcoidosis of lung ([place])   . Spinal stenosis     Past Surgical History:  Procedure Laterality Date  . ADENOIDECTOMY    . PACEMAKER INSERTION Left 07/26/2016   Abbott Assurity Model #PQ33007 - Serial #[id number] -- complicated by pneumothorax requiring chest tube placement  . TEE WITHOUT CARDIOVERSION  02/2013   EF 55-60% with normal global function. No thrombus, mass or vegetation. Trivial-small pericardial effusion. Moderate LA dilation. Mild MR.  . TONSILLECTOMY    . TRANSTHORACIC ECHOCARDIOGRAM  07/24/2016   [facility]: EF 45-50%. Mid and apical inferior, inferolateral and apical hypokinesis. GR 2 DD. Mild LA and moderate RA dilation. Mild paracardial effusion. Moderate to severe TR. Basal and mid segment hypokinesis with apical hypokinesis. - Suspect stress-induced cardiopathy versus multivessel CAD.  Marland Kitchen TRANSTHORACIC ECHOCARDIOGRAM  07/26/2016   [place]: LIMITED (post PPM) - although the report suggested EF 30-35% with apical akinesis, rounding note suggested this was an improvement    Family Hx:  Family History  Problem Relation Age of Onset  . Allergic rhinitis Neg Hx   . Angioedema Neg Hx   . Asthma Neg Hx   . Eczema Neg Hx   . Immunodeficiency Neg Hx   . Urticaria Neg Hx     Social History:  reports that she quit smoking about 35 years ago. Her smoking use included cigarettes. She started smoking about 55 years ago. She has a 25.50 pack-year smoking history. She has never used smokeless tobacco. She reports that she does not drink alcohol or use drugs.  Allergies:  Allergies  Allergen Reactions  . Dust Mite Extract  Shortness Of Breath  . Levaquin [Levofloxacin In D5w] Shortness Of Breath  . Lidex [Fluocinonide] Swelling    Caused lip swelling  . Molds & Smuts Shortness Of Breath  . Rifampin Shortness Of Breath  . Lactose Other (See Comments)    unknown  . Penicillins Swelling    Did it involve swelling of the face/tongue/throat, SOB, or low BP? No Did it involve sudden or severe rash/hives, skin peeling, or any reaction on the inside of your mouth or nose? Unknown Did you need to seek medical attention at a hospital or doctor's office? Unknown When did it last happen?unk If all above answers are "NO", may proceed with cephalosporin use.     Medications: Prior to Admission medications   Medication Sig Start Date End Date Taking? Authorizing Provider  acetaminophen (TYLENOL) 500 MG tablet Take 500-1,000 mg by mouth every 6 (six) hours as needed for moderate pain or headache.   Yes [provider]  amLODipine (NORVASC)  2.5 MG tablet TAKE 1 TABLET BY MOUTH EVERY DAY Patient taking differently: Take 2.5 mg by mouth daily.  10/31/18  Yes Saguier, Percell Miller, PA-C  Ascorbic Acid (VITAMIN C) 1000 MG tablet Take 1,000 mg by mouth daily.   Yes [provider]  atorvastatin (LIPITOR) 10 MG tablet Take 1/2 tablet (5mg ) by mouth daily Patient taking differently: Take 5 mg by mouth daily at 6 PM.  10/31/18  Yes Saguier, Percell Miller, PA-C  benzonatate (TESSALON) 100 MG capsule Take 1 capsule (100 mg total) by mouth 3 (three) times daily as needed for cough. 12/19/18  Yes Saguier, Percell Miller, PA-C  docusate sodium (COLACE) 100 MG capsule Take 100 mg by mouth daily as needed for mild constipation.    Yes [provider]  hydroxypropyl methylcellulose / hypromellose (ISOPTO TEARS / GONIOVISC) 2.5 % ophthalmic solution Place 1 drop into both eyes 3 (three) times daily.    Yes [provider]  levocetirizine (XYZAL) 5 MG tablet Take 5 mg by mouth every evening.   Yes [provider]  Multiple Vitamins-Minerals (MULTIVITAMIN WITH MINERALS) tablet Take 1 tablet by mouth daily.   Yes [provider]  sulfamethoxazole-trimethoprim (BACTRIM DS,SEPTRA DS) 800-160 MG tablet Take 1 tablet by mouth 2 (two) times daily. 12/19/18  Yes Saguier, Percell Miller, PA-C  albuterol (PROVENTIL HFA;VENTOLIN HFA) 108 (90 Base) MCG/ACT inhaler Inhale 2 puffs into the lungs every 6 (six) hours as needed for wheezing or shortness of breath. Patient not taking: Reported on 12/23/2018 09/12/18   Saguier, Percell Miller, PA-C  Biotin 10000 MCG TABS Take 10,000 mcg by mouth daily.     [provider]  ipratropium (ATROVENT HFA) 17 MCG/ACT inhaler Inhale 2 puffs into the lungs every 6 (six) hours as needed for wheezing. Patient not taking: Reported on 12/23/2018 09/12/18   Saguier, Percell Miller, PA-C  mupirocin cream (BACTROBAN) 2 % Apply 1 application topically 2 (two) times daily. Patient not taking: Reported on 12/23/2018 04/16/18   Saguier, Percell Miller, PA-C    I have reviewed the patient's current medications.  Labs:  Results for orders placed or performed during the hospital encounter of 12/23/18 (from the past 48 hour(s))  Basic metabolic panel     Status: Abnormal   Collection Time: 12/23/18  8:11 AM  Result Value Ref Range   Sodium 116 (LL) 135 - 145 mmol/L    Comment: CRITICAL RESULT CALLED TO, READ BACK BY AND VERIFIED WITH: BINGHAM.S RN AT 0901 12/23/18 MULLINS,T    Potassium 3.0 (L) 3.5 - 5.1 mmol/L   Chloride 84 (L) 98 - 111 mmol/L   CO2 20 (L) 22 - 32 mmol/L   Glucose, Bld 163 (H) 70 - 99 mg/dL   BUN 8 8 - 23 mg/dL   Creatinine, Ser 0.36 (L) 0.44 - 1.00 mg/dL   Calcium 8.1 (L) 8.9 - 10.3 mg/dL   GFR calc non Af Amer >60 >60 mL/min   GFR calc Af Amer >60 >60 mL/min   Anion gap 12 5 - 15    Comment: Performed at [facility], [place] [address], [place], [place] [address]  Brain natriuretic peptide     Status: Abnormal   Collection Time: 12/23/18  8:11 AM  Result Value Ref  Range   B Natriuretic Peptide 113.7 (H) 0.0 - 100.0 pg/mL    Comment: Performed at [facility], [place] [address] [place], [place] [address]  CBC     Status: Abnormal   Collection Time: 12/23/18  8:11 AM  Result Value Ref Range   WBC 9.1 4.0 - 10.5 K/uL   RBC 3.78 (L) 3.87 - 5.11 MIL/uL   Hemoglobin 11.3 (L) 12.0 - 15.0 g/dL   HCT 33.2 (L) 36.0 - 46.0 %   MCV 87.8 80.0 - 100.0 fL   MCH 29.9 26.0 - 34.0 pg   MCHC 34.0 30.0 - 36.0 g/dL   RDW 13.8 11.5 - 15.5 %   Platelets 186 150 - 400 K/uL   nRBC 0.0 0.0 - 0.2 %    Comment: Performed at [facility], [place] [address], [place], [place] [address]  Troponin I - ONCE - STAT     Status: None   Collection Time: 12/23/18  8:11 AM  Result Value Ref Range   Troponin I <0.03 <0.03 ng/mL    Comment: Performed at [facility], [place] [address] [place], [place] [address]     ROS: Unable to obtain review of system because patient received Ativan and somnolent.  Physical Exam: Vitals:   12/23/18 1330 12/23/18 1430  BP: 118/69 107/71  Pulse: 72 73  Resp: 17 17  Temp:    SpO2: 100% 98%     General exam: Calm and comfortable.  Opens eyes with the name. Respiratory system: Mild expiratory wheeze, no crackle. Respiratory effort normal. Cardiovascular system: S1 & S2 heard, RRR, no rubs.  No pedal edema. Gastrointestinal system: Abdomen is nondistended, soft and nontender. Normal bowel sounds heard. Central nervous system: Alert but sedated Extremities: No lower extremity edema Skin: No rashes, lesions or ulcers Psychiatry: Unable to assess.  Assessment/Plan:  #Hyponatremia likely due to decreased solute intake and dehydration.  She is not on diuretics.  She may have SIADH due to chronic lung disease and pneumonia.  CT scan of chest negative for any mass, she has lipoid pneumonia.  Patient was agitated earlier and received IV Ativan in ER.  Difficult to assess mental status at this time  and in this elderly female. -Patient received a liter of normal saline.  Check  stat BMP. Continue NS. -Check urinalysis, urine sodium, urine osmolality, serum osmolality, TSH level, uric acid level and a.m. cortisol. -Monitor BMP every 4 hourly.  Avoid rapid correction.  #Hypokalemia: Patient received IV potassium chloride.  Repeating lab.  #Respiratory distress: Patient is using around 2 L of oxygen via nasal cannula.  Per primary team  #History of hypertension: Blood pressure acceptable.  Continue to monitor.   Dron Tanna Furry 12/23/2018, 2:44 PM  [facility].

## 2018-12-23 NOTE — ED Notes (Signed)
Patient transported to X-ray 

## 2018-12-23 NOTE — ED Notes (Signed)
ED TO INPATIENT HANDOFF REPORT  Name/Age/Gender Tracey Holt 85 y.o. female  Code Status    Code Status Orders  (From admission, onward)         Start     Ordered   12/23/18 1637  Limited resuscitation (code)  Continuous    Question Answer Comment  In the event of cardiac or respiratory ARREST: Initiate Code Blue, Call Rapid Response Yes   In the event of cardiac or respiratory ARREST: Perform CPR Yes   In the event of cardiac or respiratory ARREST: Perform Intubation/Mechanical Ventilation No   In the event of cardiac or respiratory ARREST: Use NIPPV/BiPAp only if indicated Yes   In the event of cardiac or respiratory ARREST: Administer ACLS medications if indicated Yes   In the event of cardiac or respiratory ARREST: Perform Defibrillation or Cardioversion if indicated Yes      12/23/18 1636        Code Status History    Date Active Date Inactive Code Status Order ID Comments User Context   12/06/2018 0803 12/07/2018 1657 Full Code [id number]  [place]ford Shi, MD ED   08/13/2018 0858 08/15/2018 1552 Full Code [id number]  Louellen Molder, MD Inpatient   04/07/2018 0455 04/08/2018 1705 Full Code [id number]  Etta Quill, DO Inpatient    Advance Directive Documentation     Most Recent Value  Type of Advance Directive  Healthcare Power of Attorney  Pre-existing out of facility DNR order (yellow form or pink MOST form)  -  "MOST" Form in Place?  -      Home/SNF/Other Skilled nursing facility  Chief Complaint short of breath   Level of Care/Admitting Diagnosis ED Disposition    ED Disposition Condition [facility]: [facility] [[id number]]  Level of Care: Stepdown [14]  Admit to SDU based on following criteria: Hemodynamic compromise or significant risk of instability:  Patient requiring short term acute titration and management of vasoactive drips, and invasive monitoring (i.e., CVP and Arterial line).  Diagnosis: Hyponatremia  [[id number]]  Admitting Physician: Elmarie Shiley [phone]  Attending Physician: Elmarie Shiley [phone]  Estimated length of stay: 3 - 4 days  Certification:: I certify this patient will need inpatient services for at least 2 midnights  PT Class (Do Not Modify): Inpatient [101]  PT Acc Code (Do Not Modify): Private [1]       Medical History Past Medical History:  Diagnosis Date  . Bradycardic cardiac arrest ([place]) 07/24/2016   Required CPR, intubation with multiple rib fractures. Had short term cardiac shock with acute heart failure.  Resulted in possible stress-induced cardiomyopathy  . Cardiac related syncope 07/24/2016   Bradycardic arrest requiring CPR 2 with transvenous pacemaker placed followed by permanent pacemaker; complicated by respiratory arrest, type II MI, stress-induced cardiopathy  . Cardiomyopathy ([place]) 07/2016   Moderate severely reduced EF with apical hypokinesis suggestive of stress-induced cardiac myopathy versus multivessel CAD --> in setting of her to cardiac arrest  . COPD (chronic obstructive pulmonary disease) ([place])    By report  . History of chronic constipation   . Hypercholesterolemia   . Osteoporosis   . Pacemaker 07/26/2016   Abbott dual-chamber pacemaker: [place] #[id number] - Serial [id number]  . Pneumonia 08/2016  . Sarcoidosis of lung ([place])   . Spinal stenosis     Allergies Allergies  Allergen Reactions  . Dust Mite Extract Shortness Of Breath  . Levaquin [Levofloxacin In D5w] Shortness Of Breath  .  Lidex [Fluocinonide] Swelling    Caused lip swelling  . Molds & Smuts Shortness Of Breath  . Rifampin Shortness Of Breath  . Lactose Other (See Comments)    unknown  . Penicillins Swelling    Did it involve swelling of the face/tongue/throat, SOB, or low BP? No Did it involve sudden or severe rash/hives, skin peeling, or any reaction on the inside of your mouth or nose? Unknown Did you need to seek medical attention at a hospital  or doctor's office? Unknown When did it last happen?unk If all above answers are "NO", may proceed with cephalosporin use.     IV Location/Drains/Wounds Patient Lines/Drains/Airways Status   Active Line/Drains/Airways    Name:   Placement date:   Placement time:   Site:   Days:   Peripheral IV 12/23/18 Left Forearm   12/23/18    -    Forearm   less than 1   Peripheral IV 12/23/18 Left;Upper Forearm   12/23/18    0942    Forearm   less than 1   Urethral Catheter DJ EMT 14 Fr.   12/23/18    1625    -   less than 1          Labs/Imaging Results for orders placed or performed during the hospital encounter of 12/23/18 (from the past 48 hour(s))  Basic metabolic panel     Status: Abnormal   Collection Time: 12/23/18  8:11 AM  Result Value Ref Range   Sodium 116 (LL) 135 - 145 mmol/L    Comment: CRITICAL RESULT CALLED TO, READ BACK BY AND VERIFIED WITH: BINGHAM.S RN AT 0901 12/23/18 MULLINS,T    Potassium 3.0 (L) 3.5 - 5.1 mmol/L   Chloride 84 (L) 98 - 111 mmol/L   CO2 20 (L) 22 - 32 mmol/L   Glucose, Bld 163 (H) 70 - 99 mg/dL   BUN 8 8 - 23 mg/dL   Creatinine, Ser 0.36 (L) 0.44 - 1.00 mg/dL   Calcium 8.1 (L) 8.9 - 10.3 mg/dL   GFR calc non Af Amer >60 >60 mL/min   GFR calc Af Amer >60 >60 mL/min   Anion gap 12 5 - 15    Comment: Performed at [facility], [place] [address], [place], [place] [address]  Brain natriuretic peptide     Status: Abnormal   Collection Time: 12/23/18  8:11 AM  Result Value Ref Range   B Natriuretic Peptide 113.7 (H) 0.0 - 100.0 pg/mL    Comment: Performed at [facility], [place] [address], [place], [place] [address]  CBC     Status: Abnormal   Collection Time: 12/23/18  8:11 AM  Result Value Ref Range   WBC 9.1 4.0 - 10.5 K/uL   RBC 3.78 (L) 3.87 - 5.11 MIL/uL   Hemoglobin 11.3 (L) 12.0 - 15.0 g/dL   HCT 33.2 (L) 36.0 - 46.0 %   MCV 87.8 80.0 - 100.0 fL   MCH 29.9 26.0 - 34.0 pg   MCHC 34.0 30.0 - 36.0 g/dL    RDW 13.8 11.5 - 15.5 %   Platelets 186 150 - 400 K/uL   nRBC 0.0 0.0 - 0.2 %    Comment: Performed at [facility], [place] [address] [place], [place] [address]  Troponin I - ONCE - STAT     Status: None   Collection Time: 12/23/18  8:11 AM  Result Value Ref Range   Troponin I <0.03 <0.03 ng/mL  Comment: Performed at [facility], [place] [address], [place], [place] [address]  Cortisol-am, blood     Status: Abnormal   Collection Time: 12/23/18  8:11 AM  Result Value Ref Range   Cortisol - AM 31.8 (H) 6.7 - 22.6 ug/dL    Comment: Performed at [facility] [address], [place], [place] [address]  Basic metabolic panel     Status: Abnormal   Collection Time: 12/23/18  2:55 PM  Result Value Ref Range   Sodium 118 (LL) 135 - 145 mmol/L    Comment: CRITICAL RESULT CALLED TO, READ BACK BY AND VERIFIED WITH: M.TEUP AT 1532 ON 12/23/18 BY N.THOMPSON    Potassium 3.4 (L) 3.5 - 5.1 mmol/L   Chloride 86 (L) 98 - 111 mmol/L   CO2 23 22 - 32 mmol/L   Glucose, Bld 193 (H) 70 - 99 mg/dL   BUN 7 (L) 8 - 23 mg/dL   Creatinine, Ser 0.46 0.44 - 1.00 mg/dL   Calcium 7.9 (L) 8.9 - 10.3 mg/dL   GFR calc non Af Amer >60 >60 mL/min   GFR calc Af Amer >60 >60 mL/min   Anion gap 9 5 - 15    Comment: Performed at [facility], [place] [address] [place],  [address]   Dg Chest 2 View  Result Date: 12/23/2018 CLINICAL DATA:  New onset left arm and left chest pain with shortness of breath EXAM: CHEST - 2 VIEW COMPARISON:  12/15/2018 FINDINGS: Chronic cardiomegaly and aortic tortuosity. Dual-chamber pacer leads from the left. In the area of suspected pneumonia prior study there is a EKG lead today. No clear consolidation. Trace pleural fluid on both sides versus atelectasis. Diaphragm flattening correlating with history of COPD. Thoracic kyphosis from multilevel compression fracture. IMPRESSION: 1. No definite acute disease. 2. COPD and  cardiomegaly. Electronically Signed   By: Monte Fantasia M.D.   On: 12/23/2018 07:44   Dg Shoulder 1v Left  Result Date: 12/23/2018 CLINICAL DATA:  Shortness of breath.  Left arm pain. EXAM: LEFT SHOULDER - 1 VIEW COMPARISON:  CT 12/23/2017.  Chest x-ray 12/23/2017. FINDINGS: Diffuse osteopenia. Acromioclavicular and glenohumeral degenerative change. No acute bony abnormality. Cardiac pacer noted. Left base atelectasis/infiltrate and small left pleural effusion. IMPRESSION: 1. Diffuse osteopenia. Acromioclavicular glenohumeral degenerative change. No acute abnormality. 2. Left base atelectasis/infiltrate and small left pleural effusion. 3.  Cardiac pacer noted. Electronically Signed   By: Marcello Moores  Register   On: 12/23/2018 14:03   Ct Angio Chest Pe W And/or Wo Contrast  Result Date: 12/23/2018 CLINICAL DATA:  Pneumonia, unresolved for complicated EXAM: CT ANGIOGRAPHY CHEST WITH CONTRAST TECHNIQUE: Multidetector CT imaging of the chest was performed using the standard protocol during bolus administration of intravenous contrast. Multiplanar CT image reconstructions and MIPs were obtained to evaluate the vascular anatomy. CONTRAST:  171mL ISOVUE-370 IOPAMIDOL (ISOVUE-370) INJECTION 76% COMPARISON:  Chest x-ray from earlier today.  PET-CT 10/12/2016 FINDINGS: Cardiovascular: Cardiomegaly without pericardial effusion. Dual-chamber pacer from the left with leads at the apical right ventricular septum and right atrial appendage. Satisfactory opacification of the pulmonary arteries. Accounting for intermittent motion there is no evident pulmonary embolism. Mediastinum/Nodes: Negative for adenopathy. There is a small hiatal hernia and likely some fluid within the lower esophagus. Lungs/Pleura: Chronic triangular opacity in the lingula with associated airway collapse. This has been noted since at least 2017 with stable to regressed size, compatible with a benign process. The opacity is again very low-density, with  lipoid  pneumonia question on prior study. There is bronchomalacia with dynamic central airway collapse/narrowing. Dependent atelectasis. No acute consolidation, edema, or significant pleural fluid. Upper Abdomen: Due to contrast timing and paucity of intra-abdominal fat there is limited assessment. Musculoskeletal: Kyphoscoliosis and degenerative disease. Remote appearing T6, T7, T8, T9, and T10 compression fractures. Apparent defect at the mid sternal body attributed to motion artifact. No step-off on preceding chest x-ray. Review of the MIP images confirms the above findings. IMPRESSION: 1. Negative for pulmonary embolism or other acute finding. 2. No significant change in chronic lingular consolidation when compared to 2017 PET. Lipoid pneumonia is again considered given the low-density. 3. Tracheobronchomalacia and kyphoscoliosis with mild dependent atelectasis. Electronically Signed   By: Monte Fantasia M.D.   On: 12/23/2018 10:37   EKG Interpretation  Date/Time:  Tuesday December 23 2018 06:59:50 EST Ventricular Rate:  96 PR Interval:    QRS Duration: 150 QT Interval:  403 QTC Calculation: 510 R Axis:   -80 Text Interpretation:  Atrial-sensed ventricular-paced complexes No further analysis attempted due to paced rhythm Confirmed by Jola Schmidt [phone]) on 12/23/2018 7:29:08 AM   Pending Labs Unresulted Labs (From admission, onward)    Start     Ordered   12/23/18 1637  TSH  Once,   R     12/23/18 1636   12/23/18 1500  Urinalysis, Routine w reflex microscopic  Once,   R     12/23/18 1459   12/23/18 1456  Na and K (sodium & potassium), rand urine  Once,   R     12/23/18 1455   12/23/18 1456  Osmolality, urine  Once,   R     12/23/18 1455   12/23/18 5329  Basic metabolic panel  Now then every 4 hours,   R     12/23/18 1254   12/23/18 1447  Osmolality  Once,   R     12/23/18 1446   12/23/18 1447  Uric acid  Once,   R     12/23/18 1446   12/23/18 1221  Sodium, urine, random  Once,   R      12/23/18 1221   12/23/18 1221  Osmolality, urine  Once,   R     12/23/18 1221   Signed and Held  Creatinine, serum  (enoxaparin (LOVENOX)    CrCl < 30 ml/min)  Once,   R    Comments:  Baseline for enoxaparin therapy IF NOT ALREADY DRAWN.    Signed and Held   Signed and Held  CBC  Tomorrow morning,   R     Signed and Held          Vitals/Pain Today's Vitals   12/23/18 1200 12/23/18 1330 12/23/18 1430 12/23/18 1600  BP: 110/62 118/69 107/71 (!) 142/88  Pulse: 78 72 73 97  Resp: 16 17 17 17   Temp:      TempSrc:      SpO2: 99% 100% 98% 95%  PainSc:        Isolation Precautions No active isolations  Medications Medications  sodium chloride (PF) 0.9 % injection (has no administration in time range)  iopamidol (ISOVUE-370) 76 % injection (has no administration in time range)  0.9 %  sodium chloride infusion ( Intravenous New Bag/Given 12/23/18 1353)  ipratropium-albuterol (DUONEB) 0.5-2.5 (3) MG/3ML nebulizer solution 3 mL (3 mLs Nebulization Given 12/23/18 1355)  potassium chloride 10 mEq in 100 mL IVPB (10 mEq Intravenous New Bag/Given 12/23/18 1610)  morphine 4 MG/ML injection 4  mg (4 mg Intravenous Given 12/23/18 0819)  sodium chloride 0.9 % bolus 500 mL (0 mLs Intravenous Stopped 12/23/18 1028)  iopamidol (ISOVUE-370) 76 % injection 100 mL (100 mLs Intravenous Contrast Given 12/23/18 1014)  LORazepam (ATIVAN) injection 0.5 mg (0.5 mg Intravenous Given 12/23/18 1107)  sodium chloride 0.9 % bolus 500 mL (0 mLs Intravenous Stopped 12/23/18 1342)    Mobility walks with device

## 2018-12-23 NOTE — Progress Notes (Signed)
Discussed with ER nurse Foley was inserted with >1000 cc urine and still draining. Pt is resting well. The urine electrolytes and osmolality is still not available. I will continue NS@100  cc/hr and repeat BMP in 4 hours. The serum Na is 118 from 116.  If serum Na not improved in repeat lab then consider low rate hypertonic saline. Avoid rapid sodium correction in this elderly female.  D/w primary team.

## 2018-12-23 NOTE — ED Notes (Signed)
Patient transported to X-ray ABD FILM

## 2018-12-23 NOTE — ED Notes (Signed)
ADMISSION Provider at bedside. 

## 2018-12-23 NOTE — ED Notes (Signed)
URINE CULTURE SENT WITH URINE

## 2018-12-23 NOTE — ED Notes (Signed)
ADMISSION Provider at bedside. UPDATED ON PT CURRENT STATUS. RESTLESS, PT URINATING HOWEVER C/O OF STOMACH PAIN, NA ++ 118

## 2018-12-23 NOTE — Progress Notes (Signed)
CRITICAL VALUE ALERT  Critical Value:  Na 121,  K 4.3  Date & Time Notied: 12/23/2018 2013  Provider Notified: Dr.Patel Neph[facility]   Orders Received/Actions taken: Awaiting orders

## 2018-12-23 NOTE — ED Provider Notes (Addendum)
[facility] DEPT Provider Note   CSN: [id number] Arrival date & time: 12/23/18  0175     History   Chief Complaint Chief Complaint  Patient presents with  . Shortness of Breath    HPI Tracey Holt is a 85 y.o. female with history of cardiomyopathy, bradycardic cardiac arrest s/p pacemaker, COPD, HTN, sarcoidosis who arrives via EMS from [facility] for shortness of breath and AMS. Patient has been treated with multiple courses of antibiotics for pneumonia since 09/2018. She was most recently seen by her PCP on 1/31 and started on Bactrim for LLL pneumonia.  She was also recently admitted on 1/18 for symptomatic hyponatremia felt to be related to dehydration versus SIADH.   This morning, Tracey Holt kept repeating "please take this off" and complaining of left arm pain. She is not able to describe when her shortness of breath began or any other symptoms.     Past Medical History:  Diagnosis Date  . Bradycardic cardiac arrest ([place]) 07/24/2016   Required CPR, intubation with multiple rib fractures. Had short term cardiac shock with acute heart failure.  Resulted in possible stress-induced cardiomyopathy  . Cardiac related syncope 07/24/2016   Bradycardic arrest requiring CPR 2 with transvenous pacemaker placed followed by permanent pacemaker; complicated by respiratory arrest, type II MI, stress-induced cardiopathy  . Cardiomyopathy ([place]) 07/2016   Moderate severely reduced EF with apical hypokinesis suggestive of stress-induced cardiac myopathy versus multivessel CAD --> in setting of her to cardiac arrest  . COPD (chronic obstructive pulmonary disease) ([place])    By report  . History of chronic constipation   . Hypercholesterolemia   . Osteoporosis   . Pacemaker 07/26/2016   Abbott dual-chamber pacemaker: [place] #[id number] - Serial [id number]  . Pneumonia 08/2016  . Sarcoidosis of lung ([place])   . Spinal stenosis     Patient Active  Problem List   Diagnosis Date Noted  . Disorientation   . Hyponatremia syndrome 08/13/2018  . Acute metabolic encephalopathy 77/82/4235  . Aspiration pneumonia of both lower lobes due to gastric secretions ([place]) 08/13/2018  . Intractable headache 04/07/2018  . Abnormal CT of the abdomen 04/07/2018  . CHB (complete heart block) ([place]) 12/20/2016  . Chronic systolic congestive heart failure ([place]) 12/20/2016  . Angioedema 12/17/2016  . Chronic rhinitis 12/17/2016  . Dyspnea 11/09/2016  . Upper airway cough syndrome 10/29/2016  . Abnormal CT of the chest  c/w lipoid pna 10/29/2016  . Toe ulcer, right ([place]) 10/17/2016  . S/P placement of cardiac pacemaker 09/19/2016  . History of syncope 09/19/2016  . Bradycardic cardiac arrest ([place]) 07/24/2016    Past Surgical History:  Procedure Laterality Date  . ADENOIDECTOMY    . PACEMAKER INSERTION Left 07/26/2016   Abbott Assurity Model #[id number] - Serial #[id number] -- complicated by pneumothorax requiring chest tube placement  . TEE WITHOUT CARDIOVERSION  02/2013   EF 55-60% with normal global function. No thrombus, mass or vegetation. Trivial-small pericardial effusion. Moderate LA dilation. Mild MR.  . TONSILLECTOMY    . TRANSTHORACIC ECHOCARDIOGRAM  07/24/2016   [facility]: EF 45-50%. Mid and apical inferior, inferolateral and apical hypokinesis. GR 2 DD. Mild LA and moderate RA dilation. Mild paracardial effusion. Moderate to severe TR. Basal and mid segment hypokinesis with apical hypokinesis. - Suspect stress-induced cardiopathy versus multivessel CAD.  Marland Kitchen TRANSTHORACIC ECHOCARDIOGRAM  07/26/2016   [facility] [facility]: LIMITED (post PPM) - although the report suggested EF 30-35% with apical akinesis, rounding  note suggested this was an improvement     OB History   No obstetric history on file.      Home Medications    Prior to Admission medications   Medication Sig Start Date End Date Taking?  Authorizing Provider  acetaminophen (TYLENOL) 500 MG tablet Take 500-1,000 mg by mouth every 6 (six) hours as needed for moderate pain or headache.    [provider]  albuterol (PROVENTIL HFA;VENTOLIN HFA) 108 (90 Base) MCG/ACT inhaler Inhale 2 puffs into the lungs every 6 (six) hours as needed for wheezing or shortness of breath. 09/12/18   Saguier, Percell Miller, PA-C  amLODipine (NORVASC) 2.5 MG tablet TAKE 1 TABLET BY MOUTH EVERY DAY Patient taking differently: Take 2.5 mg by mouth daily.  10/31/18   Saguier, Percell Miller, PA-C  Ascorbic Acid (VITAMIN C) 1000 MG tablet Take 1,000 mg by mouth daily.    [provider]  atorvastatin (LIPITOR) 10 MG tablet Take 1/2 tablet (5mg ) by mouth daily Patient taking differently: Take 5 mg by mouth daily at 6 PM.  10/31/18   Saguier, Percell Miller, PA-C  benzonatate (TESSALON) 100 MG capsule Take 1 capsule (100 mg total) by mouth 3 (three) times daily as needed for cough. 12/19/18   Saguier, Percell Miller, PA-C  Biotin 10000 MCG TABS Take 10,000 mcg by mouth daily.     [provider]  Calcium Citrate-Vitamin D (CITRACAL + D PO) Take 1 tablet by mouth 2 (two) times daily.    [provider]  Coenzyme Q10 (CO Q 10) 100 MG CAPS Take 100 mg by mouth daily.    [provider]  docusate sodium (COLACE) 100 MG capsule Take 100 mg by mouth daily as needed for mild constipation.     [provider]  fluticasone (FLONASE) 50 MCG/ACT nasal spray Place 1 spray into both nostrils daily as needed for allergies.     [provider]  hydroxypropyl methylcellulose / hypromellose (ISOPTO TEARS / GONIOVISC) 2.5 % ophthalmic solution Place 1 drop into both eyes 3 (three) times daily.     [provider]  ipratropium (ATROVENT HFA) 17 MCG/ACT inhaler Inhale 2 puffs into the lungs every 6 (six) hours as needed for wheezing. 09/12/18   Saguier, Percell Miller, PA-C  levocetirizine (XYZAL) 5 MG tablet Take 5 mg by mouth every evening.     [provider]  Misc Natural Products (GLUCOS-CHONDROIT-MSM COMPLEX) TABS Take 2 tablets by mouth daily.    [provider]  Multiple Vitamins-Minerals (MULTIVITAMIN WITH MINERALS) tablet Take 1 tablet by mouth daily.    [provider]  mupirocin cream (BACTROBAN) 2 % Apply 1 application topically 2 (two) times daily. 04/16/18   Saguier, Percell Miller, PA-C  Probiotic CAPS Take 1 capsule by mouth daily.    [provider]  sulfamethoxazole-trimethoprim (BACTRIM DS,SEPTRA DS) 800-160 MG tablet Take 1 tablet by mouth 2 (two) times daily. 12/19/18   Saguier, Percell Miller, PA-C    Family History Family History  Problem Relation Age of Onset  . Allergic rhinitis Neg Hx   . Angioedema Neg Hx   . Asthma Neg Hx   . Eczema Neg Hx   . Immunodeficiency Neg Hx   . Urticaria Neg Hx     Social History Social History   Tobacco Use  . Smoking status: Former Smoker    Packs/day: 1.50    Years: 17.00    Pack years: 25.50    Types: Cigarettes    Start date: 12/18/1963    Last attempt  to quit: 11/20/1983    Years since quitting: 35.1  . Smokeless tobacco: Never Used  Substance Use Topics  . Alcohol use: No  . Drug use: No     Allergies   Dust mite extract; Levaquin [levofloxacin in d5w]; Lidex [fluocinonide]; Molds & smuts; Rifampin; Lactose; and Penicillins   Review of Systems Review of Systems  Unable to perform ROS: Dementia     Physical Exam Updated Vital Signs BP 129/71 (BP Location: Right Arm)   Pulse 93   Temp 98 F (36.7 C) (Oral)   Resp 19   SpO2 95%   Physical Exam Vitals signs reviewed.  Constitutional:      General: She is awake. She is in acute distress.     Appearance: She is cachectic.     Interventions: Face mask in place.  HENT:     Head: Normocephalic and atraumatic.  Eyes:     Pupils: Pupils are equal, round, and reactive to light.  Neck:     Musculoskeletal: Neck supple.  Cardiovascular:     Rate and Rhythm: Regular rhythm.  Tachycardia present.     Heart sounds: Murmur present.  Pulmonary:     Effort: Tachypnea present.     Breath sounds: Rhonchi present.  Musculoskeletal:     Left shoulder: She exhibits tenderness.  Skin:    General: Skin is warm and dry.     Coloration: Skin is not cyanotic.  Neurological:     Mental Status: She is alert. She is disoriented.      ED Treatments / Results  Labs (all labs ordered are listed, but only abnormal results are displayed) Labs Reviewed  BASIC METABOLIC PANEL - Abnormal; Notable for the following components:      Result Value   Sodium 116 (*)    Potassium 3.0 (*)    Chloride 84 (*)    CO2 20 (*)    Glucose, Bld 163 (*)    Creatinine, Ser 0.36 (*)    Calcium 8.1 (*)    All other components within normal limits  BRAIN NATRIURETIC PEPTIDE - Abnormal; Notable for the following components:   B Natriuretic Peptide 113.7 (*)    All other components within normal limits  CBC - Abnormal; Notable for the following components:   RBC 3.78 (*)    Hemoglobin 11.3 (*)    HCT 33.2 (*)    All other components within normal limits  TROPONIN I    EKG EKG Interpretation  Date/Time:  Tuesday December 23 2018 06:59:50 EST Ventricular Rate:  96 PR Interval:    QRS Duration: 150 QT Interval:  403 QTC Calculation: 510 R Axis:   -80 Text Interpretation:  Atrial-sensed ventricular-paced complexes No further analysis attempted due to paced rhythm Confirmed by Jola Schmidt [phone]) on 12/23/2018 7:29:08 AM   Radiology Dg Chest 2 View  Result Date: 12/23/2018 CLINICAL DATA:  New onset left arm and left chest pain with shortness of breath EXAM: CHEST - 2 VIEW COMPARISON:  12/15/2018 FINDINGS: Chronic cardiomegaly and aortic tortuosity. Dual-chamber pacer leads from the left. In the area of suspected pneumonia prior study there is a EKG lead today. No clear consolidation. Trace pleural fluid on both sides versus atelectasis. Diaphragm flattening correlating with history of  COPD. Thoracic kyphosis from multilevel compression fracture. IMPRESSION: 1. No definite acute disease. 2. COPD and cardiomegaly. Electronically Signed   By: Monte Fantasia M.D.   On: 12/23/2018 07:44   Ct Angio Chest Pe W And/or Wo Contrast  Result Date: 12/23/2018 CLINICAL DATA:  Pneumonia, unresolved for complicated EXAM: CT ANGIOGRAPHY CHEST WITH CONTRAST TECHNIQUE: Multidetector CT imaging of the chest was performed using the standard protocol during bolus administration of intravenous contrast. Multiplanar CT image reconstructions and MIPs were obtained to evaluate the vascular anatomy. CONTRAST:  126mL ISOVUE-370 IOPAMIDOL (ISOVUE-370) INJECTION 76% COMPARISON:  Chest x-ray from earlier today.  PET-CT 10/12/2016 FINDINGS: Cardiovascular: Cardiomegaly without pericardial effusion. Dual-chamber pacer from the left with leads at the apical right ventricular septum and right atrial appendage. Satisfactory opacification of the pulmonary arteries. Accounting for intermittent motion there is no evident pulmonary embolism. Mediastinum/Nodes: Negative for adenopathy. There is a small hiatal hernia and likely some fluid within the lower esophagus. Lungs/Pleura: Chronic triangular opacity in the lingula with associated airway collapse. This has been noted since at least 2017 with stable to regressed size, compatible with a benign process. The opacity is again very low-density, with lipoid pneumonia question on prior study. There is bronchomalacia with dynamic central airway collapse/narrowing. Dependent atelectasis. No acute consolidation, edema, or significant pleural fluid. Upper Abdomen: Due to contrast timing and paucity of intra-abdominal fat there is limited assessment. Musculoskeletal: Kyphoscoliosis and degenerative disease. Remote appearing T6, T7, T8, T9, and T10 compression fractures. Apparent defect at the mid sternal body attributed to motion artifact. No step-off on preceding chest x-ray. Review of the  MIP images confirms the above findings. IMPRESSION: 1. Negative for pulmonary embolism or other acute finding. 2. No significant change in chronic lingular consolidation when compared to 2017 PET. Lipoid pneumonia is again considered given the low-density. 3. Tracheobronchomalacia and kyphoscoliosis with mild dependent atelectasis. Electronically Signed   By: Monte Fantasia M.D.   On: 12/23/2018 10:37    Procedures Procedures (including critical care time)  Medications Ordered in ED Medications  sodium chloride (PF) 0.9 % injection (has no administration in time range)  iopamidol (ISOVUE-370) 76 % injection (has no administration in time range)  morphine 4 MG/ML injection 4 mg (4 mg Intravenous Given 12/23/18 0819)  sodium chloride 0.9 % bolus 500 mL (0 mLs Intravenous Stopped 12/23/18 1028)  iopamidol (ISOVUE-370) 76 % injection 100 mL (100 mLs Intravenous Contrast Given 12/23/18 1014)  LORazepam (ATIVAN) injection 0.5 mg (0.5 mg Intravenous Given 12/23/18 1107)     Initial Impression / Assessment and Plan / ED Course  I have reviewed the triage vital signs and the nursing notes.   Pertinent labs & imaging results that were available during my care of the patient were reviewed by me and considered in my medical decision making (see chart for details).  Patient presenting with AMS and dyspnea. Initial work-up reveals sodium of 116 which is down form 131 8 days ago. Will bolus with 500 of normal saline. CXR on 1/27 showed developing left mid lung opacity. CXR today unable to properly visualize due to EKG lead placement. Given her history of recurrent pneumonias, since November, will obtain chest CT to further evaluate. Concern for malignancy in the setting of possible SIADH. She is afebrile without leukocytosis. Will not initiate antibiotics at this time.    Final Clinical Impressions(s) / ED Diagnoses   Final diagnoses:  Hyponatremia   Patient's clinical picture likely all related to  symptomatic hyponatremia. CT chest noted no changed in chronic lingular infiltrate seen 2 years ago. No evidence of acute infectious process, PE or cardiac ischemia on work-up.   Have consulted hospitalist service to admit for further work-up and management of hyponatremia.    ED Discharge Orders  None       Delice Bison, DO 12/23/18 1147    [address] D, DO 12/23/18 [place], DO 12/23/18 1326    Jola Schmidt, MD 12/23/18 1659

## 2018-12-23 NOTE — ED Notes (Signed)
Family at bedside. 

## 2018-12-23 NOTE — ED Notes (Addendum)
Patient transported to CT 

## 2018-12-23 NOTE — ED Triage Notes (Signed)
Pt arrived via [facility] from [facility]. Patient has shortness of breath. Patient has diminished rhonchi lung sounds, Patient was given treatment on EMS. 10 mg albuterol,  .5 atravent 125 of solumedrol  Hx of COPD  20 L FA

## 2018-12-23 NOTE — ED Notes (Signed)
ED Provider at bedside. CAMPOS 

## 2018-12-23 NOTE — H&P (Addendum)
History and Physical  Machelle Raybon TJQ:[id number] DOB: Apr 19, 1922 DOA: 12/23/2018  PCP: Mackie Pai, PA-C Patient coming from: SNF  I have personally briefly reviewed patient's old medical records in [place]   Chief Complaint: Confusion, diarrhea.  HPI: Tracey Holt is a 85 y.o. female with past medical history significant for cardiomyopathy ejection fraction 45 to 50%, COPD, Chronic constipation, bradycardic  cardiac arrest status post pacemaker, recent admission for hyponatremia on December 07, 2018.  Hyponatremia was thought to be related to dehydration versus SIADH. Patient during my evaluation was  lethargic, she received very low-dose Ativan for severe agitation.  When patient arrived to the ED she was very tachypneic, very agitated.  After small dose of Ativan she has been resting. Son at bedside is able to provide history.  He relates that the facility informed him that this morning she has been very agitated and confused.  He is back with his mother the day prior to admission and she was fine.  Patient has been having diarrhea since Monday.  Patient has a history of constipation for which she takes stool softener.  Son relate that she took some laxative and had suppository.  Since then she developed diarrhea.  Patient was also started recently on Bactrim to treat presumed pneumonia.  He received 3 doses of Bactrim.  Addendum; patient later in the afternoon, wake up. Was very agitated, complaining of supra-pubic pain. Foley catheter was place and yield 1 L of urine.    Evaluation in the ED: Sodium 116, potassium 3.0, chloride 84, CO2 20, BUN 8, creatinine 0.3, BNP 113, hemoglobin 11 cortisol 31.  CT Angio; Negative for pulmonary embolism or other acute finding. No significant change in chronic lingular consolidation when compared to 2017 PET. Lipoid pneumonia is again considered given the low-density.  Tracheobronchomalacia and kyphoscoliosis with mild dependent  atelectasis   Review of Systems: All systems reviewed and apart from history of presenting illness, are negative.  Past Medical History:  Diagnosis Date  . Bradycardic cardiac arrest ([place]) 07/24/2016   Required CPR, intubation with multiple rib fractures. Had short term cardiac shock with acute heart failure.  Resulted in possible stress-induced cardiomyopathy  . Cardiac related syncope 07/24/2016   Bradycardic arrest requiring CPR 2 with transvenous pacemaker placed followed by permanent pacemaker; complicated by respiratory arrest, type II MI, stress-induced cardiopathy  . Cardiomyopathy ([place]) 07/2016   Moderate severely reduced EF with apical hypokinesis suggestive of stress-induced cardiac myopathy versus multivessel CAD --> in setting of her to cardiac arrest  . COPD (chronic obstructive pulmonary disease) ([place])    By report  . History of chronic constipation   . Hypercholesterolemia   . Osteoporosis   . Pacemaker 07/26/2016   Abbott dual-chamber pacemaker: [place] #[id number] - Serial [id number]  . Pneumonia 08/2016  . Sarcoidosis of lung ([place])   . Spinal stenosis    Past Surgical History:  Procedure Laterality Date  . ADENOIDECTOMY    . PACEMAKER INSERTION Left 07/26/2016   Abbott Assurity Model #[id number] - Serial #[id number] -- complicated by pneumothorax requiring chest tube placement  . TEE WITHOUT CARDIOVERSION  02/2013   EF 55-60% with normal global function. No thrombus, mass or vegetation. Trivial-small pericardial effusion. Moderate LA dilation. Mild MR.  . TONSILLECTOMY    . TRANSTHORACIC ECHOCARDIOGRAM  07/24/2016   [facility]: EF 45-50%. Mid and apical inferior, inferolateral and apical hypokinesis. GR 2 DD. Mild LA and moderate RA dilation. Mild paracardial effusion. Moderate  to severe TR. Basal and mid segment hypokinesis with apical hypokinesis. - Suspect stress-induced cardiopathy versus multivessel CAD.  Marland Kitchen TRANSTHORACIC  ECHOCARDIOGRAM  07/26/2016   [place]: LIMITED (post PPM) - although the report suggested EF 30-35% with apical akinesis, rounding note suggested this was an improvement   Social History:  reports that she quit smoking about 35 years ago. Her smoking use included cigarettes. She started smoking about 55 years ago. She has a 25.50 pack-year smoking history. She has never used smokeless tobacco. She reports that she does not drink alcohol or use drugs.   Allergies  Allergen Reactions  . Dust Mite Extract Shortness Of Breath  . Levaquin [Levofloxacin In D5w] Shortness Of Breath  . Lidex [Fluocinonide] Swelling    Caused lip swelling  . Molds & Smuts Shortness Of Breath  . Rifampin Shortness Of Breath  . Lactose Other (See Comments)    unknown  . Penicillins Swelling    Did it involve swelling of the face/tongue/throat, SOB, or low BP? No Did it involve sudden or severe rash/hives, skin peeling, or any reaction on the inside of your mouth or nose? Unknown Did you need to seek medical attention at a hospital or doctor's office? Unknown When did it last happen?unk If all above answers are "NO", may proceed with cephalosporin use.     Family History  Problem Relation Age of Onset  . Allergic rhinitis Neg Hx   . Angioedema Neg Hx   . Asthma Neg Hx   . Eczema Neg Hx   . Immunodeficiency Neg Hx   . Urticaria Neg Hx     Prior to Admission medications   Medication Sig Start Date End Date Taking? Authorizing Provider  acetaminophen (TYLENOL) 500 MG tablet Take 500-1,000 mg by mouth every 6 (six) hours as needed for moderate pain or headache.   Yes [provider]  amLODipine (NORVASC) 2.5 MG tablet TAKE 1 TABLET BY MOUTH EVERY DAY Patient taking differently: Take 2.5 mg by mouth daily.  10/31/18  Yes Saguier, Percell Miller, PA-C  Ascorbic Acid (VITAMIN C) 1000 MG tablet Take 1,000 mg by mouth daily.   Yes [provider]  atorvastatin (LIPITOR)  10 MG tablet Take 1/2 tablet (75m) by mouth daily Patient taking differently: Take 5 mg by mouth daily at 6 PM.  10/31/18  Yes Saguier, EPercell Miller PA-C  benzonatate (TESSALON) 100 MG capsule Take 1 capsule (100 mg total) by mouth 3 (three) times daily as needed for cough. 12/19/18  Yes Saguier, EPercell Miller PA-C  docusate sodium (COLACE) 100 MG capsule Take 100 mg by mouth daily as needed for mild constipation.    Yes [provider]  hydroxypropyl methylcellulose / hypromellose (ISOPTO TEARS / GONIOVISC) 2.5 % ophthalmic solution Place 1 drop into both eyes 3 (three) times daily.    Yes [provider]  levocetirizine (XYZAL) 5 MG tablet Take 5 mg by mouth every evening.   Yes [provider]  Multiple Vitamins-Minerals (MULTIVITAMIN WITH MINERALS) tablet Take 1 tablet by mouth daily.   Yes [provider]  sulfamethoxazole-trimethoprim (BACTRIM DS,SEPTRA DS) 800-160 MG tablet Take 1 tablet by mouth 2 (two) times daily. 12/19/18  Yes Saguier, EPercell Miller PA-C  albuterol (PROVENTIL HFA;VENTOLIN HFA) 108 (90 Base) MCG/ACT inhaler Inhale 2 puffs into the lungs every 6 (six) hours as needed for wheezing or shortness of breath. Patient not taking: Reported on 12/23/2018 09/12/18   Saguier, EPercell Miller PA-C  Biotin 10000 MCG TABS Take 10,000 mcg by mouth  daily.     [provider]  ipratropium (ATROVENT HFA) 17 MCG/ACT inhaler Inhale 2 puffs into the lungs every 6 (six) hours as needed for wheezing. Patient not taking: Reported on 12/23/2018 09/12/18   Saguier, Percell Miller, PA-C  mupirocin cream (BACTROBAN) 2 % Apply 1 application topically 2 (two) times daily. Patient not taking: Reported on 12/23/2018 04/16/18   Mackie Pai, PA-C   Physical Exam: Vitals:   12/23/18 1200 12/23/18 1330 12/23/18 1430 12/23/18 1600  BP: 110/62 118/69 107/71 (!) 142/88  Pulse: 78 72 73 97  Resp: _0 Temp:      TempSrc:      SpO2: 99% 100% 98% 95%     General exam: Thin appearing,  lethargic  Head, eyes and ENT: Nontraumatic and normocephalic. Pupils equally reacting to light and accommodation. Oral mucosa moist.  Neck: Supple. No JVD, carotid bruit or thyromegaly.  Lymphatics: No lymphadenopathy.  Respiratory system: Mild tachypnea, bilateral wheezing  Cardiovascular system: S1 and S2 heard, RRR. No JVD, murmurs, gallops, clicks or pedal edema.  Gastrointestinal system: Abdomen is mildly distended, lower quadrant tenderness, no rigidity no guarding.  Central nervous system: Lethargic, affecting airway  Extremities: Symmetric 5 x 5 power. Peripheral pulses symmetrically felt.   Skin: No rashes or acute findings.  Musculoskeletal system: Negative exam.  Psychiatry: Unable to evaluate at this time, on arrival to the ED she was very agitated   Labs on Admission:  Basic Metabolic Panel: Recent Labs  Lab 12/23/18 0811 12/23/18 1455  NA 116* 118*  K 3.0* 3.4*  CL 84* 86*  CO2 20* 23  GLUCOSE 163* 193*  BUN 8 7*  CREATININE 0.36* 0.46  CALCIUM 8.1* 7.9*   Liver Function Tests: No results for input(s): AST, ALT, ALKPHOS, BILITOT, PROT, ALBUMIN in the last 168 hours. No results for input(s): LIPASE, AMYLASE in the last 168 hours. No results for input(s): AMMONIA in the last 168 hours. CBC: Recent Labs  Lab 12/23/18 0811  WBC 9.1  HGB 11.3*  HCT 33.2*  MCV 87.8  PLT 186   Cardiac Enzymes: Recent Labs  Lab 12/23/18 0811  TROPONINI <0.03    BNP (last 3 results) Recent Labs    01/02/18 1443  PROBNP 76.0   CBG: No results for input(s): GLUCAP in the last 168 hours.  Radiological Exams on Admission: Dg Chest 2 View  Result Date: 12/23/2018 CLINICAL DATA:  New onset left arm and left chest pain with shortness of breath EXAM: CHEST - 2 VIEW COMPARISON:  12/15/2018 FINDINGS: Chronic cardiomegaly and aortic tortuosity. Dual-chamber pacer leads from the left. In the area of suspected pneumonia prior study there is a EKG lead today. No clear  consolidation. Trace pleural fluid on both sides versus atelectasis. Diaphragm flattening correlating with history of COPD. Thoracic kyphosis from multilevel compression fracture. IMPRESSION: 1. No definite acute disease. 2. COPD and cardiomegaly. Electronically Signed   By: Monte Fantasia M.D.   On: 12/23/2018 07:44   Dg Shoulder 1v Left  Result Date: 12/23/2018 CLINICAL DATA:  Shortness of breath.  Left arm pain. EXAM: LEFT SHOULDER - 1 VIEW COMPARISON:  CT 12/23/2017.  Chest x-ray 12/23/2017. FINDINGS: Diffuse osteopenia. Acromioclavicular and glenohumeral degenerative change. No acute bony abnormality. Cardiac pacer noted. Left base atelectasis/infiltrate and small left pleural effusion. IMPRESSION: 1. Diffuse osteopenia. Acromioclavicular glenohumeral degenerative change. No acute abnormality. 2. Left base atelectasis/infiltrate and small left pleural effusion. 3.  Cardiac pacer noted. Electronically Signed   By: Marcello Moores  Register   On: 12/23/2018 14:03   Ct Angio Chest Pe W And/or Wo Contrast  Result Date: 12/23/2018 CLINICAL DATA:  Pneumonia, unresolved for complicated EXAM: CT ANGIOGRAPHY CHEST WITH CONTRAST TECHNIQUE: Multidetector CT imaging of the chest was performed using the standard protocol during bolus administration of intravenous contrast. Multiplanar CT image reconstructions and MIPs were obtained to evaluate the vascular anatomy. CONTRAST:  17m ISOVUE-370 IOPAMIDOL (ISOVUE-370) INJECTION 76% COMPARISON:  Chest x-ray from earlier today.  PET-CT 10/12/2016 FINDINGS: Cardiovascular: Cardiomegaly without pericardial effusion. Dual-chamber pacer from the left with leads at the apical right ventricular septum and right atrial appendage. Satisfactory opacification of the pulmonary arteries. Accounting for intermittent motion there is no evident pulmonary embolism. Mediastinum/Nodes: Negative for adenopathy. There is a small hiatal hernia and likely some fluid within the lower esophagus.  Lungs/Pleura: Chronic triangular opacity in the lingula with associated airway collapse. This has been noted since at least 2017 with stable to regressed size, compatible with a benign process. The opacity is again very low-density, with lipoid pneumonia question on prior study. There is bronchomalacia with dynamic central airway collapse/narrowing. Dependent atelectasis. No acute consolidation, edema, or significant pleural fluid. Upper Abdomen: Due to contrast timing and paucity of intra-abdominal fat there is limited assessment. Musculoskeletal: Kyphoscoliosis and degenerative disease. Remote appearing T6, T7, T8, T9, and T10 compression fractures. Apparent defect at the mid sternal body attributed to motion artifact. No step-off on preceding chest x-ray. Review of the MIP images confirms the above findings. IMPRESSION: 1. Negative for pulmonary embolism or other acute finding. 2. No significant change in chronic lingular consolidation when compared to 2017 PET. Lipoid pneumonia is again considered given the low-density. 3. Tracheobronchomalacia and kyphoscoliosis with mild dependent atelectasis. Electronically Signed   By: JMonte FantasiaM.D.   On: 12/23/2018 10:37    EKG: Ventricular paced complex  Assessment/Plan Active Problems:   S/P placement of cardiac pacemaker   Abnormal CT of the chest  c/w lipoid pna   Chronic systolic congestive heart failure (HCC)   Hyponatremia syndrome   Hyponatremia   1-Hyponatremia; Recurrent admission for the same. Suspect component of hypovolemia, dehydration.  Recent history of diarrhea. Continue with IV fluids. Received IV bolus. Nephrology consulted.  Discussed with Dr. BCarolin Sicks  He recommend to continue with normal saline at this time, sodium has increased to 118.  And patient confusion is not clear if it was related to urinary retention, medication like Ativan.  Need to follow further B-met.  If sodium does not improve will probably need to start  patient on hyper tonic saline. Check cortisol level..Marland Kitchen 2-COPD: Acute exacerbation Patient with diffuse wheezing. Will order IV Solu-Medrol.  Schedule nebulizer treatment.   3-Hypokalemia;  Replete IV.   4-Acute metabolic encephalopathy; multifactorial, related to pain, urinary retension, hyponatremia, electrolytes abnormalities.  Check KUB, rule out constipation.   5-Chronic lipoid PNA; aspiration precaution.  Speech evaluation.  Stop Bactrim.   6-chronic systolic heart failure: Monitor on IV fluids. Peers dehydrated.  7-Diarrhea; Suspect related to laxative.  Patient was also on Bactrim.  This was discontinued.  Will need to monitor closely. If diarrhea persist will require future evaluation.  DVT Prophylaxis: Lovenox Code Status:'s CODE STATUS with son, patient is a partial code no intubation. Family Communication: Son who was at bedside Disposition Plan: Back to rehab when stable.  Time spent: 75 minutes.     It is my clinical opinion that [place] [facility] and [place] in this 85y.o. female .  presenting with symptoms of encephalopathy, respiratory distress, concerning for hyponatremia, COPD exacerbation . in the context of PMH including: Prior history of hyponatremia, COPD. . with pertinent positives on physical exam including: Patient was very agitated on presentation, tachypneic bilateral wheezing.  Sodium on admission on 116 . and pertinent positives on radiographic and laboratory data including: Sodium on admission at 116 . Workup and treatment include CT, be met, CBC.  Given the aforementioned, the predictability of an adverse outcome is felt to be significant. I expect that the patient will require at least 2 midnights in the hospital to treat this condition.  Elmarie Shiley MD [facility]   12/23/2018, 5:05 PM

## 2018-12-23 NOTE — ED Notes (Addendum)
ED Provider at bedside. 

## 2018-12-23 NOTE — ED Notes (Signed)
Bed: WA25 Expected date:  Expected time:  Means of arrival:  Comments: EMS SOB 

## 2018-12-23 NOTE — Progress Notes (Signed)
CRITICAL VALUE ALERT  Critical Value:  Na 118, Potassium 6.3  Date & Time Notied:  12/23/18 1800 Provider Notified: Dr. Tyrell Antonio and Dr. Posey Pronto called. New orders given by both. Once new labs drawn will report back to both providers.

## 2018-12-24 ENCOUNTER — Other Ambulatory Visit: Payer: Medicare HMO

## 2018-12-24 ENCOUNTER — Inpatient Hospital Stay (HOSPITAL_COMMUNITY): Payer: Medicare HMO

## 2018-12-24 DIAGNOSIS — R1013 Epigastric pain: Secondary | ICD-10-CM

## 2018-12-24 DIAGNOSIS — R52 Pain, unspecified: Secondary | ICD-10-CM

## 2018-12-24 DIAGNOSIS — I5022 Chronic systolic (congestive) heart failure: Secondary | ICD-10-CM

## 2018-12-24 LAB — BASIC METABOLIC PANEL
Anion gap: 7 (ref 5–15)
Anion gap: 7 (ref 5–15)
Anion gap: 6 (ref 5–15)
BUN: 7 mg/dL — AB (ref 8–23)
BUN: 12 mg/dL (ref 8–23)
BUN: 13 mg/dL (ref 8–23)
CALCIUM: 8.3 mg/dL — AB (ref 8.9–10.3)
CHLORIDE: 101 mmol/L (ref 98–111)
CHLORIDE: 102 mmol/L (ref 98–111)
CO2: 22 mmol/L (ref 22–32)
CO2: 25 mmol/L (ref 22–32)
CO2: 24 mmol/L (ref 22–32)
Calcium: 8.6 mg/dL — ABNORMAL LOW (ref 8.9–10.3)
Calcium: 8.5 mg/dL — ABNORMAL LOW (ref 8.9–10.3)
Chloride: 102 mmol/L (ref 98–111)
Creatinine, Ser: 0.4 mg/dL — ABNORMAL LOW (ref 0.44–1.00)
Creatinine, Ser: 0.53 mg/dL (ref 0.44–1.00)
Creatinine, Ser: 0.42 mg/dL — ABNORMAL LOW (ref 0.44–1.00)
GFR calc Af Amer: >60 mL/min (ref >60)
GFR calc Af Amer: >60 mL/min (ref >60)
GFR calc Af Amer: >60 mL/min (ref >60)
GFR calc non Af Amer: >60 mL/min (ref >60)
GFR calc non Af Amer: >60 mL/min (ref >60)
GFR calc non Af Amer: >60 mL/min (ref >60)
Glucose, Bld: 121 mg/dL — ABNORMAL HIGH (ref 70–99)
Glucose, Bld: 114 mg/dL — ABNORMAL HIGH (ref 70–99)
Glucose, Bld: 94 mg/dL (ref 70–99)
POTASSIUM: 4.6 mmol/L (ref 3.5–5.1)
Potassium: 4.2 mmol/L (ref 3.5–5.1)
Potassium: 4.5 mmol/L (ref 3.5–5.1)
SODIUM: 132 mmol/L — AB (ref 135–145)
Sodium: 131 mmol/L — ABNORMAL LOW (ref 135–145)
Sodium: 133 mmol/L — ABNORMAL LOW (ref 135–145)

## 2018-12-24 LAB — CBC
HCT: 33.1 % — ABNORMAL LOW (ref 36.0–46.0)
Hemoglobin: 10.8 g/dL — ABNORMAL LOW (ref 12.0–15.0)
MCH: 28.5 pg (ref 26.0–34.0)
MCHC: 32.6 g/dL (ref 30.0–36.0)
MCV: 87.3 fL (ref 80.0–100.0)
Platelets: 185 10*3/uL (ref 150–400)
RBC: 3.79 MIL/uL — ABNORMAL LOW (ref 3.87–5.11)
RDW: 13.9 % (ref 11.5–15.5)
WBC: 6.4 10*3/uL (ref 4.0–10.5)
nRBC: 0 % (ref 0.0–0.2)

## 2018-12-24 MED ORDER — SODIUM CHLORIDE 0.45 % IV SOLN
INTRAVENOUS | Status: DC
  Administered 2018-12-24: 05:00:00 via INTRAVENOUS

## 2018-12-24 MED ORDER — PREDNISONE 20 MG PO TABS
20.0000 mg | ORAL_TABLET | Freq: Every day | ORAL | Status: DC
  Administered 2018-12-25: 20 mg via ORAL
  Filled 2018-12-24: qty 1

## 2018-12-24 NOTE — Care Management Note (Signed)
Case Management Note  Patient Details  Name: Rejina Odle MRN: [id number] Date of Birth: [birth year]  Subjective/Objective:                  Discharge Readiness Return to top of Dehydration RRG - [facility]  Discharge readiness is indicated by patient meeting Recovery Milestones, including ALL of the following: ? Hemodynamic stability YES ? Cause of dehydration requiring inpatient treatment absent ? HYPONATREMIA-SODIUM 127-131 ? Vomiting absent or controlled CONTROLLED ? Mental status at baseline YES ? Renal function at baseline or improved URINE OUTPT =0= FOLEY INSERTED >1000CC URINE OBTAINED /BUN=,7.CREAT. 0.40 ? Electrolyte levels normal or acceptable for outpatient follow-up ? NA-131  ? Ambulatory  YES ? Oral hydration FULL LIQUIDS ? LEVEL OF CARE SDU NOT READY FOR NEXT LEVEL   Action/Plan: Following for progression of care. Following for case management needs.  Expected Discharge Date:  (unknown)               Expected Discharge Plan:     In-House Referral:     Discharge planning Services     Post Acute Care Choice:    Choice offered to:     DME Arranged:    DME Agency:     HH Arranged:    HH Agency:     Status of Service:     If discussed at [employer], dates discussed:    Additional Comments:  Leeroy Cha, RN 12/24/2018, 10:16 AM

## 2018-12-24 NOTE — Progress Notes (Addendum)
[facility] NEPHROLOGY PROGRESS NOTE  Assessment/ Plan: Pt is a 85 y.o. yo female with history of syncope, bradycardia, CHF, COPD admitted with shortness of breath and hyponatremia, serum sodium 117.  #Subacute hyponatremia due to reduced solute intake and dehydration: Patient was treated with normal saline.  Initially sodium corrected faster therefore the fluid was changed to half NS.  Serum sodium level is 133 today. -Encourage oral intake, recommend dietitian consult.  She is more alert and improved mental status.  I will discontinue IV fluid.  Repeat BMP in the evening.  TSH, cortisol acceptable.  #Hypokalemia: acceptable.  #Respiratory distress: On room air.  #History of hypertension: Blood pressure on the lower side.  Not on antihypertensive. #CHF.  I will sign off, call us with question.  Discussed with primary team.  Subjective: Seen and examined.  She is alert awake and comfortable.  Denies chest pain or shortness of breath. Objective Vital signs in last 24 hours: Vitals:   12/24/18 0834 12/24/18 0900 12/24/18 1200 12/24/18 1421  BP:  (!) 116/96 91/68   Pulse: 78 81 79 82  Resp: 16 (!) 23 (!) 21 20  Temp:   98.7 F (37.1 C)   TempSrc:   Oral   SpO2: 94% 95% 97% 97%  Weight:      Height:       Weight change:   Intake/Output Summary (Last 24 hours) at 12/24/2018 1458 Last data filed at 12/24/2018 1300 Gross per 24 hour  Intake 3350.08 ml  Output 3150 ml  Net 200.08 ml       Labs: Basic Metabolic Panel: Recent Labs  Lab 12/23/18 2259 12/24/18 0243 12/24/18 1349  NA 127* 131* 133*  K 4.1 4.2 4.6  CL 98 102 101  CO2 22 22 25   GLUCOSE 155* 121* 114*  BUN 6* 7* 12  CREATININE 0.33* 0.40* 0.53  CALCIUM 8.3* 8.3* 8.6*   Liver Function Tests: No results for input(s): AST, ALT, ALKPHOS, BILITOT, PROT, ALBUMIN in the last 168 hours. No results for input(s): LIPASE, AMYLASE in the last 168 hours. No results for input(s): AMMONIA in the last 168  hours. CBC: Recent Labs  Lab 12/23/18 0811 12/24/18 0243  WBC 9.1 6.4  HGB 11.3* 10.8*  HCT 33.2* 33.1*  MCV 87.8 87.3  PLT 186 185   Cardiac Enzymes: Recent Labs  Lab 12/23/18 0811  TROPONINI <0.03   CBG: No results for input(s): GLUCAP in the last 168 hours.  Iron Studies: No results for input(s): IRON, TIBC, TRANSFERRIN, FERRITIN in the last 72 hours. Studies/Results: Dg Chest 2 View  Result Date: 12/23/2018 CLINICAL DATA:  New onset left arm and left chest pain with shortness of breath EXAM: CHEST - 2 VIEW COMPARISON:  12/15/2018 FINDINGS: Chronic cardiomegaly and aortic tortuosity. Dual-chamber pacer leads from the left. In the area of suspected pneumonia prior study there is a EKG lead today. No clear consolidation. Trace pleural fluid on both sides versus atelectasis. Diaphragm flattening correlating with history of COPD. Thoracic kyphosis from multilevel compression fracture. IMPRESSION: 1. No definite acute disease. 2. COPD and cardiomegaly. Electronically Signed   By: Monte Fantasia M.D.   On: 12/23/2018 07:44   Dg Shoulder 1v Left  Result Date: 12/23/2018 CLINICAL DATA:  Shortness of breath.  Left arm pain. EXAM: LEFT SHOULDER - 1 VIEW COMPARISON:  CT 12/23/2017.  Chest x-ray 12/23/2017. FINDINGS: Diffuse osteopenia. Acromioclavicular and glenohumeral degenerative change. No acute bony abnormality. Cardiac pacer noted. Left base atelectasis/infiltrate and small left  pleural effusion. IMPRESSION: 1. Diffuse osteopenia. Acromioclavicular glenohumeral degenerative change. No acute abnormality. 2. Left base atelectasis/infiltrate and small left pleural effusion. 3.  Cardiac pacer noted. Electronically Signed   By: Marcello Moores  Register   On: 12/23/2018 14:03   Dg Abd 1 View  Result Date: 12/23/2018 CLINICAL DATA:  Abdominal pain and constipation. EXAM: ABDOMEN - 1 VIEW COMPARISON:  Abdomen pelvis CT dated 04/07/2018. Chest CTA obtained earlier today. FINDINGS: Normal bowel gas  pattern with a paucity of intestinal gas. No significant stool visualized. Calcific density overlying the left pelvis with an appearance suggesting an image artifact. Moderate levoconvex thoracolumbar rotary scoliosis. Previously described chronic opacity in the left lung. Enlarged heart and pacemaker leads. IMPRESSION: No acute abnormality. Electronically Signed   By: Claudie Revering M.D.   On: 12/23/2018 18:11   Ct Head Wo Contrast  Result Date: 12/24/2018 CLINICAL DATA:  Altered mental status.  Double vision.  Confusion. EXAM: CT HEAD WITHOUT CONTRAST TECHNIQUE: Contiguous axial images were obtained from the base of the skull through the vertex without intravenous contrast. COMPARISON:  12/06/2018 and multiple previous FINDINGS: Brain: No sign of acute or subacute infarction. The brainstem and cerebellum are normal. Cerebral hemispheres show age related volume loss with mild chronic small-vessel change of the white matter. No large vessel territory infarction. No mass lesion, hemorrhage, hydrocephalus or extra-axial collection. Vascular: There is atherosclerotic calcification of the major vessels at the base of the brain. Skull: Negative Sinuses/Orbits: Clear/normal Other: None IMPRESSION: No acute finding by CT. Mild age related volume loss and minimal small vessel change of the hemispheric white matter. Electronically Signed   By: Nelson Chimes M.D.   On: 12/24/2018 12:29   Ct Angio Chest Pe W And/or Wo Contrast  Result Date: 12/23/2018 CLINICAL DATA:  Pneumonia, unresolved for complicated EXAM: CT ANGIOGRAPHY CHEST WITH CONTRAST TECHNIQUE: Multidetector CT imaging of the chest was performed using the standard protocol during bolus administration of intravenous contrast. Multiplanar CT image reconstructions and MIPs were obtained to evaluate the vascular anatomy. CONTRAST:  198mL ISOVUE-370 IOPAMIDOL (ISOVUE-370) INJECTION 76% COMPARISON:  Chest x-ray from earlier today.  PET-CT 10/12/2016 FINDINGS:  Cardiovascular: Cardiomegaly without pericardial effusion. Dual-chamber pacer from the left with leads at the apical right ventricular septum and right atrial appendage. Satisfactory opacification of the pulmonary arteries. Accounting for intermittent motion there is no evident pulmonary embolism. Mediastinum/Nodes: Negative for adenopathy. There is a small hiatal hernia and likely some fluid within the lower esophagus. Lungs/Pleura: Chronic triangular opacity in the lingula with associated airway collapse. This has been noted since at least 2017 with stable to regressed size, compatible with a benign process. The opacity is again very low-density, with lipoid pneumonia question on prior study. There is bronchomalacia with dynamic central airway collapse/narrowing. Dependent atelectasis. No acute consolidation, edema, or significant pleural fluid. Upper Abdomen: Due to contrast timing and paucity of intra-abdominal fat there is limited assessment. Musculoskeletal: Kyphoscoliosis and degenerative disease. Remote appearing T6, T7, T8, T9, and T10 compression fractures. Apparent defect at the mid sternal body attributed to motion artifact. No step-off on preceding chest x-ray. Review of the MIP images confirms the above findings. IMPRESSION: 1. Negative for pulmonary embolism or other acute finding. 2. No significant change in chronic lingular consolidation when compared to 2017 PET. Lipoid pneumonia is again considered given the low-density. 3. Tracheobronchomalacia and kyphoscoliosis with mild dependent atelectasis. Electronically Signed   By: Monte Fantasia M.D.   On: 12/23/2018 10:37    Medications: Infusions:  Scheduled Medications: . atorvastatin  5 mg Oral q1800  . enoxaparin (LOVENOX) injection  30 mg Subcutaneous Q24H  . ipratropium-albuterol  3 mL Nebulization TID  . mouth rinse  15 mL Mouth Rinse BID  . polyvinyl alcohol  1 drop Both Eyes TID  . [START ON 12/25/2018] predniSONE  20 mg Oral Q  breakfast    have reviewed scheduled and prn medications.  Physical Exam: General:NAD, comfortable Heart:RRR, s1s2 nl Lungs:clear b/l, no crackle Abdomen:soft, Non-tender, non-distended Extremities:No edema  Emerson Schreifels Tanna Furry 12/24/2018,2:58 PM  LOS: 1 day

## 2018-12-24 NOTE — Progress Notes (Signed)
Pt. Was seeing double vision but could still tell RN what number was being held up on hand in all vision fields. RN made doc aware doc verbally ordered a stat CT head it came back negative for stroke and other acute abnormalities. Pt. Vision has went back to normal.

## 2018-12-24 NOTE — Progress Notes (Addendum)
[facility] PROGRESS NOTE  Tracey Holt KPV:[id number] DOB: [birth year] DOA: 12/23/2018   PCP: Mackie Pai, PA-C     Assessment/Plan: Active Problems:   S/P placement of cardiac pacemaker   Abnormal CT of the chest  c/w lipoid pna   Chronic systolic congestive heart failure (HCC)   Hyponatremia syndrome   Hyponatremia    85 y.o. female.  With history of bradycardia, syncope, cardiac arrest status post pacemaker, CHF with EF of 45 to 50%, COPD, hypertension presented from assisted living facility for the evaluation of shortness of breath.  Patient has been treated with multiple course of antibiotics for pneumonia.  Reportedly patient was recently started on Bactrim for the management of left lower lobe pneumonia.  In the ER patient was found to have serum sodium level of 116, potassium 3, creatinine 0.36.  Nephrology was consulted for the evaluation of hyponatremia  Assessment and plan #Hyponatremia likely due to decreased solute intake and dehydration.  She is not on diuretics.  She may have SIADH due to chronic lung disease and pneumonia.  CT scan of chest negative for any mass, she has lipoid pneumonia.   .-patient has been hydrated with normal saline. Sodium has improved from  118> 131 . Continue serial  BMP. Continue NS.urine osmolality is 301. Urine sodium is 33. TSH within normal limits. Uric acid 2.0. this morning patient was converted over to half normal saline by nephrology  #Hypokalemia: Patient received IV potassium chloride.  Marland Kitchen  #COPD exacerbation patient received IV Solu-Medrol and nebulizer treatments , wheezing has improved,DC IV steroids, switch to oral prednisone, patient has completed treatment for pneumonia  #History of hypertension: Blood pressure acceptable.  Continue to monitor.  #Acute metabolic encephalopathy; multifactorial, related to pain, urinary retension, hyponatremia, electrolytes abnormalities. Improving Will obtain CT head to r/o stroke      5-Chronic lipoid PNA; aspiration precaution.  Speech evaluation.     6-chronic systolic heart failure: Monitor on IV fluids. Peers dehydrated. No echo done since 2014  7-Diarrhea; Suspect related to laxative.  Patient was also on Bactrim.  This was discontinued.  Will need to monitor closely. If diarrhea persist will require future evaluation.  DVT prophylaxsis Lovenox  Code Status: partial code   Family Communication: Discussed in detail with the patient and son along with RN, all imaging results, lab results explained to the patient   Disposition Plan:  Monitor electrolytes for another 24 hours    Consultants:  nephrology  Procedures:  none  Antibiotics: Anti-infectives (From admission, onward)   None         HPI/Subjective: She complains of double vision, she is awake and conversive ,  Objective: Vitals:   12/24/18 0300 12/24/18 0400 12/24/18 0500 12/24/18 0700  BP: 118/85  (!) 102/53 (!) 101/49  Pulse: 89  73 70  Resp: (!) 25  (!) 22 (!) 24  Temp:  97.8 F (36.6 C)    TempSrc:  Axillary    SpO2: 94%  95% 95%  Weight:      Height:        Intake/Output Summary (Last 24 hours) at 12/24/2018 0801 Last data filed at 12/24/2018 0718 Gross per 24 hour  Intake 3922.63 ml  Output 2800 ml  Net 1122.63 ml     Examination:  General exam: Appears calm and comfortable  Respiratory system: Clear to auscultation. Respiratory effort normal. Cardiovascular system: S1 & S2 heard, RRR. No JVD, murmurs, rubs, gallops or clicks. No pedal edema.  Gastrointestinal system: Abdomen is nondistended, soft and nontender. No organomegaly or masses felt. Normal bowel sounds heard. Central nervous system: Alert and oriented. No focal neurological deficits. Extremities: Symmetric 5 x 5 power. Skin: No rashes, lesions or ulcers      Data Reviewed: I have personally reviewed following labs and imaging studies  Micro Results Recent Results (from the past 240  hour(s))  MRSA PCR Screening     Status: None   Collection Time: 12/23/18  7:20 PM  Result Value Ref Range Status   MRSA by PCR NEGATIVE NEGATIVE Final    Comment:        The GeneXpert MRSA Assay (FDA approved for NASAL specimens only), is one component of a comprehensive MRSA colonization surveillance program. It is not intended to diagnose MRSA infection nor to guide or monitor treatment for MRSA infections. Performed at [facility], [place] [address], [place], [place] [address]     Radiology Reports Dg Chest 2 View  Result Date: 12/23/2018 CLINICAL DATA:  New onset left arm and left chest pain with shortness of breath EXAM: CHEST - 2 VIEW COMPARISON:  12/15/2018 FINDINGS: Chronic cardiomegaly and aortic tortuosity. Dual-chamber pacer leads from the left. In the area of suspected pneumonia prior study there is a EKG lead today. No clear consolidation. Trace pleural fluid on both sides versus atelectasis. Diaphragm flattening correlating with history of COPD. Thoracic kyphosis from multilevel compression fracture. IMPRESSION: 1. No definite acute disease. 2. COPD and cardiomegaly. Electronically Signed   By: Monte Fantasia M.D.   On: 12/23/2018 07:44   Dg Chest 2 View  Result Date: 12/15/2018 CLINICAL DATA:  Cough for 10 days, COPD, pacemaker, history COPD, sarcoidosis, cardiomyopathy, former smoker EXAM: CHEST - 2 VIEW COMPARISON:  12/06/2018 FINDINGS: LEFT subclavian transvenous pacemaker leads project at RIGHT atrium and RIGHT ventricle unchanged. Enlargement of cardiac silhouette. Atherosclerotic calcification aorta. Mediastinal contours and pulmonary vascularity normal. Emphysematous and minimal bronchitic changes consistent with COPD. Probable small calcified RIGHT hilar lymph nodes. Chronic streaky atelectasis LEFT base. New LEFT mid lung opacity since 12/06/2018 question pneumonia. Bibasilar atelectasis. No pneumothorax. Osseous demineralization with multiple  thoracic compression fractures and accentuated thoracic kyphosis. IMPRESSION: COPD changes with bibasilar atelectasis and suspect developing LEFT perihilar pneumonia. Electronically Signed   By: Lavonia Dana M.D.   On: 12/15/2018 16:27   Dg Shoulder 1v Left  Result Date: 12/23/2018 CLINICAL DATA:  Shortness of breath.  Left arm pain. EXAM: LEFT SHOULDER - 1 VIEW COMPARISON:  CT 12/23/2017.  Chest x-ray 12/23/2017. FINDINGS: Diffuse osteopenia. Acromioclavicular and glenohumeral degenerative change. No acute bony abnormality. Cardiac pacer noted. Left base atelectasis/infiltrate and small left pleural effusion. IMPRESSION: 1. Diffuse osteopenia. Acromioclavicular glenohumeral degenerative change. No acute abnormality. 2. Left base atelectasis/infiltrate and small left pleural effusion. 3.  Cardiac pacer noted. Electronically Signed   By: Marcello Moores  Register   On: 12/23/2018 14:03   Dg Abd 1 View  Result Date: 12/23/2018 CLINICAL DATA:  Abdominal pain and constipation. EXAM: ABDOMEN - 1 VIEW COMPARISON:  Abdomen pelvis CT dated 04/07/2018. Chest CTA obtained earlier today. FINDINGS: Normal bowel gas pattern with a paucity of intestinal gas. No significant stool visualized. Calcific density overlying the left pelvis with an appearance suggesting an image artifact. Moderate levoconvex thoracolumbar rotary scoliosis. Previously described chronic opacity in the left lung. Enlarged heart and pacemaker leads. IMPRESSION: No acute abnormality. Electronically Signed   By: Claudie Revering M.D.   On: 12/23/2018 18:11   Ct Head Wo Contrast  Result Date: 12/06/2018 CLINICAL DATA:  Severe headache EXAM: CT HEAD WITHOUT CONTRAST TECHNIQUE: Contiguous axial images were obtained from the base of the skull through the vertex without intravenous contrast. COMPARISON:  Head CT 08/13/2018 FINDINGS: Brain: There is no mass, hemorrhage or extra-axial collection. The size and configuration of the ventricles and extra-axial CSF spaces are  normal. There is hypoattenuation of the white matter, most commonly indicating chronic small vessel disease. Vascular: No abnormal hyperdensity of the major intracranial arteries or dural venous sinuses. No intracranial atherosclerosis. Skull: The visualized skull base, calvarium and extracranial soft tissues are normal. Sinuses/Orbits: No fluid levels or advanced mucosal thickening of the visualized paranasal sinuses. No mastoid or middle ear effusion. The orbits are normal. IMPRESSION: Chronic small vessel ischemia without acute intracranial abnormality. Electronically Signed   By: Ulyses Jarred M.D.   On: 12/06/2018 06:24   Ct Angio Chest Pe W And/or Wo Contrast  Result Date: 12/23/2018 CLINICAL DATA:  Pneumonia, unresolved for complicated EXAM: CT ANGIOGRAPHY CHEST WITH CONTRAST TECHNIQUE: Multidetector CT imaging of the chest was performed using the standard protocol during bolus administration of intravenous contrast. Multiplanar CT image reconstructions and MIPs were obtained to evaluate the vascular anatomy. CONTRAST:  119mL ISOVUE-370 IOPAMIDOL (ISOVUE-370) INJECTION 76% COMPARISON:  Chest x-ray from earlier today.  PET-CT 10/12/2016 FINDINGS: Cardiovascular: Cardiomegaly without pericardial effusion. Dual-chamber pacer from the left with leads at the apical right ventricular septum and right atrial appendage. Satisfactory opacification of the pulmonary arteries. Accounting for intermittent motion there is no evident pulmonary embolism. Mediastinum/Nodes: Negative for adenopathy. There is a small hiatal hernia and likely some fluid within the lower esophagus. Lungs/Pleura: Chronic triangular opacity in the lingula with associated airway collapse. This has been noted since at least 2017 with stable to regressed size, compatible with a benign process. The opacity is again very low-density, with lipoid pneumonia question on prior study. There is bronchomalacia with dynamic central airway collapse/narrowing.  Dependent atelectasis. No acute consolidation, edema, or significant pleural fluid. Upper Abdomen: Due to contrast timing and paucity of intra-abdominal fat there is limited assessment. Musculoskeletal: Kyphoscoliosis and degenerative disease. Remote appearing T6, T7, T8, T9, and T10 compression fractures. Apparent defect at the mid sternal body attributed to motion artifact. No step-off on preceding chest x-ray. Review of the MIP images confirms the above findings. IMPRESSION: 1. Negative for pulmonary embolism or other acute finding. 2. No significant change in chronic lingular consolidation when compared to 2017 PET. Lipoid pneumonia is again considered given the low-density. 3. Tracheobronchomalacia and kyphoscoliosis with mild dependent atelectasis. Electronically Signed   By: Monte Fantasia M.D.   On: 12/23/2018 10:37   Dg Chest Port 1 View  Result Date: 12/06/2018 CLINICAL DATA:  Shortness of breath and cough EXAM: PORTABLE CHEST 1 VIEW COMPARISON:  October 14, 2018 FINDINGS: There is mild scarring in the bases. There is no appreciable edema or consolidation. Heart size and pulmonary vascular normal. Pacemaker leads are attached to the right atrium and right ventricle. There is aortic atherosclerosis. Bones are osteoporotic. No adenopathy is appreciable. IMPRESSION: Bibasilar scarring. No edema or consolidation. Stable cardiac silhouette. There is aortic atherosclerosis. Bones osteoporotic. Aortic Atherosclerosis (ICD10-I70.0). Electronically Signed   By: Lowella Grip III M.D.   On: 12/06/2018 08:40     CBC Recent Labs  Lab 12/23/18 0811 12/24/18 0243  WBC 9.1 6.4  HGB 11.3* 10.8*  HCT 33.2* 33.1*  PLT 186 185  MCV 87.8 87.3  MCH 29.9 28.5  MCHC 34.0 32.6  RDW 13.8 13.9    Chemistries  Recent Labs  Lab 12/23/18 1455 12/23/18 1657 12/23/18 1913 12/23/18 2259 12/24/18 0243  NA 118* 118* 121* 127* 131*  K 3.4* 6.3* 4.3 4.1 4.2  CL 86* 89* 90* 98 102  CO2 23 19* 23 22 22    GLUCOSE 193* 165* 155* 155* 121*  BUN 7* 6* 6* 6* 7*  CREATININE 0.46 0.42* 0.38* 0.33* 0.40*  CALCIUM 7.9* 7.6* 8.2* 8.3* 8.3*   ------------------------------------------------------------------------------------------------------------------ estimated creatinine clearance is 26.2 mL/min (A) (by C-G formula based on SCr of 0.4 mg/dL (L)). ------------------------------------------------------------------------------------------------------------------ No results for input(s): HGBA1C in the last 72 hours. ------------------------------------------------------------------------------------------------------------------ No results for input(s): CHOL, HDL, LDLCALC, TRIG, CHOLHDL, LDLDIRECT in the last 72 hours. ------------------------------------------------------------------------------------------------------------------ Recent Labs    12/23/18 1657  TSH 4.139   ------------------------------------------------------------------------------------------------------------------ No results for input(s): VITAMINB12, FOLATE, FERRITIN, TIBC, IRON, RETICCTPCT in the last 72 hours.  Coagulation profile No results for input(s): INR, PROTIME in the last 168 hours.  No results for input(s): DDIMER in the last 72 hours.  Cardiac Enzymes Recent Labs  Lab 12/23/18 0811  TROPONINI <0.03   ------------------------------------------------------------------------------------------------------------------ Invalid input(s): POCBNP   CBG: No results for input(s): GLUCAP in the last 168 hours.     Studies: Dg Chest 2 View  Result Date: 12/23/2018 CLINICAL DATA:  New onset left arm and left chest pain with shortness of breath EXAM: CHEST - 2 VIEW COMPARISON:  12/15/2018 FINDINGS: Chronic cardiomegaly and aortic tortuosity. Dual-chamber pacer leads from the left. In the area of suspected pneumonia prior study there is a EKG lead today. No clear consolidation. Trace pleural fluid on both sides  versus atelectasis. Diaphragm flattening correlating with history of COPD. Thoracic kyphosis from multilevel compression fracture. IMPRESSION: 1. No definite acute disease. 2. COPD and cardiomegaly. Electronically Signed   By: Monte Fantasia M.D.   On: 12/23/2018 07:44   Dg Shoulder 1v Left  Result Date: 12/23/2018 CLINICAL DATA:  Shortness of breath.  Left arm pain. EXAM: LEFT SHOULDER - 1 VIEW COMPARISON:  CT 12/23/2017.  Chest x-ray 12/23/2017. FINDINGS: Diffuse osteopenia. Acromioclavicular and glenohumeral degenerative change. No acute bony abnormality. Cardiac pacer noted. Left base atelectasis/infiltrate and small left pleural effusion. IMPRESSION: 1. Diffuse osteopenia. Acromioclavicular glenohumeral degenerative change. No acute abnormality. 2. Left base atelectasis/infiltrate and small left pleural effusion. 3.  Cardiac pacer noted. Electronically Signed   By: Marcello Moores  Register   On: 12/23/2018 14:03   Dg Abd 1 View  Result Date: 12/23/2018 CLINICAL DATA:  Abdominal pain and constipation. EXAM: ABDOMEN - 1 VIEW COMPARISON:  Abdomen pelvis CT dated 04/07/2018. Chest CTA obtained earlier today. FINDINGS: Normal bowel gas pattern with a paucity of intestinal gas. No significant stool visualized. Calcific density overlying the left pelvis with an appearance suggesting an image artifact. Moderate levoconvex thoracolumbar rotary scoliosis. Previously described chronic opacity in the left lung. Enlarged heart and pacemaker leads. IMPRESSION: No acute abnormality. Electronically Signed   By: Claudie Revering M.D.   On: 12/23/2018 18:11   Ct Angio Chest Pe W And/or Wo Contrast  Result Date: 12/23/2018 CLINICAL DATA:  Pneumonia, unresolved for complicated EXAM: CT ANGIOGRAPHY CHEST WITH CONTRAST TECHNIQUE: Multidetector CT imaging of the chest was performed using the standard protocol during bolus administration of intravenous contrast. Multiplanar CT image reconstructions and MIPs were obtained to evaluate the  vascular anatomy. CONTRAST:  19mL ISOVUE-370 IOPAMIDOL (ISOVUE-370) INJECTION 76% COMPARISON:  Chest x-ray from earlier today.  PET-CT 10/12/2016 FINDINGS: Cardiovascular: Cardiomegaly without pericardial effusion. Dual-chamber  pacer from the left with leads at the apical right ventricular septum and right atrial appendage. Satisfactory opacification of the pulmonary arteries. Accounting for intermittent motion there is no evident pulmonary embolism. Mediastinum/Nodes: Negative for adenopathy. There is a small hiatal hernia and likely some fluid within the lower esophagus. Lungs/Pleura: Chronic triangular opacity in the lingula with associated airway collapse. This has been noted since at least 2017 with stable to regressed size, compatible with a benign process. The opacity is again very low-density, with lipoid pneumonia question on prior study. There is bronchomalacia with dynamic central airway collapse/narrowing. Dependent atelectasis. No acute consolidation, edema, or significant pleural fluid. Upper Abdomen: Due to contrast timing and paucity of intra-abdominal fat there is limited assessment. Musculoskeletal: Kyphoscoliosis and degenerative disease. Remote appearing T6, T7, T8, T9, and T10 compression fractures. Apparent defect at the mid sternal body attributed to motion artifact. No step-off on preceding chest x-ray. Review of the MIP images confirms the above findings. IMPRESSION: 1. Negative for pulmonary embolism or other acute finding. 2. No significant change in chronic lingular consolidation when compared to 2017 PET. Lipoid pneumonia is again considered given the low-density. 3. Tracheobronchomalacia and kyphoscoliosis with mild dependent atelectasis. Electronically Signed   By: Monte Fantasia M.D.   On: 12/23/2018 10:37      No results found for: HGBA1C Lab Results  Component Value Date   LDLCALC 61 09/19/2017   CREATININE 0.40 (L) 12/24/2018       Scheduled Meds: . atorvastatin  5  mg Oral q1800  . enoxaparin (LOVENOX) injection  30 mg Subcutaneous Q24H  . ipratropium-albuterol  3 mL Nebulization TID  . mouth rinse  15 mL Mouth Rinse BID  . methylPREDNISolone (SOLU-MEDROL) injection  40 mg Intravenous Q12H  . polyvinyl alcohol  1 drop Both Eyes TID   Continuous Infusions: . sodium chloride 75 mL/hr at 12/24/18 0718     LOS: 1 day    Time spent: >30 MINS    Reyne Dumas  [facility] Pager [phone]. If 7PM-7AM, please contact night-coverage at [email or web address], password [facility] 12/24/2018, 8:01 AM  LOS: 1 day

## 2018-12-25 LAB — COMPREHENSIVE METABOLIC PANEL
ALT: 49 U/L — ABNORMAL HIGH (ref 0–44)
AST: 46 U/L — ABNORMAL HIGH (ref 15–41)
Albumin: 3.3 g/dL — ABNORMAL LOW (ref 3.5–5.0)
Alkaline Phosphatase: 33 U/L — ABNORMAL LOW (ref 38–126)
Anion gap: 4 — ABNORMAL LOW (ref 5–15)
BUN: 11 mg/dL (ref 8–23)
CO2: 27 mmol/L (ref 22–32)
Calcium: 8.3 mg/dL — ABNORMAL LOW (ref 8.9–10.3)
Chloride: 100 mmol/L (ref 98–111)
Creatinine, Ser: 0.38 mg/dL — ABNORMAL LOW (ref 0.44–1.00)
GFR calc Af Amer: >60 mL/min (ref >60)
GFR calc non Af Amer: >60 mL/min (ref >60)
Glucose, Bld: 92 mg/dL (ref 70–99)
Potassium: 3.6 mmol/L (ref 3.5–5.1)
Sodium: 131 mmol/L — ABNORMAL LOW (ref 135–145)
Total Bilirubin: 0.8 mg/dL (ref 0.3–1.2)
Total Protein: 5.3 g/dL — ABNORMAL LOW (ref 6.5–8.1)

## 2018-12-25 LAB — BASIC METABOLIC PANEL
Anion gap: 7 (ref 5–15)
BUN: 10 mg/dL (ref 8–23)
CO2: 29 mmol/L (ref 22–32)
Calcium: 8.5 mg/dL — ABNORMAL LOW (ref 8.9–10.3)
Chloride: 95 mmol/L — ABNORMAL LOW (ref 98–111)
Creatinine, Ser: 0.4 mg/dL — ABNORMAL LOW (ref 0.44–1.00)
GFR calc Af Amer: >60 mL/min (ref >60)
GFR calc non Af Amer: >60 mL/min (ref >60)
Glucose, Bld: 122 mg/dL — ABNORMAL HIGH (ref 70–99)
Potassium: 4 mmol/L (ref 3.5–5.1)
SODIUM: 131 mmol/L — AB (ref 135–145)

## 2018-12-25 MED ORDER — PANTOPRAZOLE SODIUM 40 MG PO TBEC: 40.0000 mg | DELAYED_RELEASE_TABLET | Freq: Every day | ORAL | 1 refills | Status: DC

## 2018-12-25 MED ORDER — MENTHOL 3 MG MT LOZG
1.0000 | LOZENGE | OROMUCOSAL | Status: DC | PRN
  Filled 2018-12-25: qty 9

## 2018-12-25 MED ORDER — GUAIFENESIN-DM 100-10 MG/5ML PO SYRP: 5.0000 mL | ORAL_SOLUTION | ORAL | 0 refills | Status: DC | PRN

## 2018-12-25 MED ORDER — ALUM & MAG HYDROXIDE-SIMETH 200-200-20 MG/5ML PO SUSP
30.0000 mL | ORAL | Status: DC | PRN
  Administered 2018-12-25: 30 mL via ORAL
  Filled 2018-12-25: qty 30

## 2018-12-25 MED ORDER — PANTOPRAZOLE SODIUM 40 MG PO TBEC: 40.0000 mg | DELAYED_RELEASE_TABLET | Freq: Every day | ORAL | Status: DC

## 2018-12-25 MED ORDER — GUAIFENESIN-DM 100-10 MG/5ML PO SYRP
5.0000 mL | ORAL_SOLUTION | ORAL | Status: DC | PRN
  Administered 2018-12-25: 5 mL via ORAL
  Filled 2018-12-25 (×2): qty 10

## 2018-12-25 NOTE — Evaluation (Signed)
Occupational Therapy Evaluation Patient Details Name: Marli Diego MRN: [id number] DOB: 01-Jan-1922 Today's Date: 12/25/2018    History of Present Illness Nevaya Nagele is a 85 y.o. female with history h/o bradycardic related syncope/cardiac arrest status post pacemaker, cardiomyopathy with EF of 45 to 50%, COPD?  Sarcoidosis (patient not aware of this diagnosis), spinal stenosis, hypercholesterolemia, hypertension who lives in independent living facility presented today with complaints of headache. Work-up in the ED revealed negative CT head but labs showed sodium of 124.  She is requested to be admitted for further work-up and management of symptomatic hyponatremia.   Clinical Impression   Pt admitted with above diagnoses, completing BADL close to baseline. Per chart review pt is from [facility] ILF[facility]F. Pt reports she does not receive assist at facility, but pt appears as poor historian. Pt able to complete functional mobility to BR with close min guard for safety. Pt demonstrates ability to complete toilet t/f and standing grooming at min guard assist level as well. She states feeling mildly off balance, due to her not walking as much recently, no LOB noted during eval. Cognitive deficits noted with STM, pt having difficulty recalling very recent events and asking the same questions many times. No follow up OT indicated at this time, will continue to follow acutely, but anticipate d/c this date.    Follow Up Recommendations  No OT follow up    Equipment Recommendations  None recommended by OT    Recommendations for Other Services       Precautions / Restrictions Precautions Precautions: Fall Restrictions Weight Bearing Restrictions: No      Mobility Bed Mobility Overal bed mobility: Needs Assistance Bed Mobility: Supine to Sit     Supine to sit: Supervision     General bed mobility comments: close supervision for safety  Transfers Overall transfer level: Needs  assistance Equipment used: None Transfers: Sit to/from Stand Sit to Stand: Supervision         General transfer comment: Supervision for safety    Balance Overall balance assessment: Needs assistance Sitting-balance support: Feet supported;No upper extremity supported Sitting balance-Leahy Scale: Good     Standing balance support: During functional activity Standing balance-Leahy Scale: Fair                             ADL either performed or assessed with clinical judgement   ADL Overall ADL's : Needs assistance/impaired Eating/Feeding: Set up;Sitting   Grooming: Min guard;Standing;Wash/dry hands   Upper Body Bathing: Min guard;Sitting   Lower Body Bathing: Min guard;Sit to/from stand   Upper Body Dressing : Set up;Sitting   Lower Body Dressing: Min guard;Sit to/from stand;Sitting/lateral leans   Toilet Transfer: Loss adjuster, chartered Details (indicate cue type and reason): uses back of toilet to push up to stand Toileting- Water quality scientist and Hygiene: Supervision/safety;Sitting/lateral lean;Sit to/from stand   Tub/ Banker: Min guard;Shower seat   Functional mobility during ADLs: Min guard General ADL Comments: pt needing lose min guard for safety/close balance during BADL     Vision Patient Visual Report: No change from baseline       Perception     Praxis      Pertinent Vitals/Pain Pain Assessment: Faces Faces Pain Scale: Hurts a little bit Pain Location: reports burning in the throat and esophagus, provided milk products to which she reported was helpful Pain Descriptors / Indicators: Sore Pain Intervention(s): Monitored during session     Hand  Dominance     Extremity/Trunk Assessment Upper Extremity Assessment Upper Extremity Assessment: Overall WFL for tasks assessed   Lower Extremity Assessment Lower Extremity Assessment: Generalized weakness   Cervical / Trunk Assessment Cervical / Trunk  Assessment: Kyphotic   Communication Communication Communication: HOH   Cognition Arousal/Alertness: Awake/alert Behavior During Therapy: WFL for tasks assessed/performed Overall Cognitive Status: History of cognitive impairments - at baseline                                 General Comments: WFL for tasks observed this date, but appears to have deficits in STM- repetitive in questioning and recall throughout    General Comments       Exercises     Shoulder Instructions      Home Living Family/patient expects to be discharged to:: Assisted living([facility])                             Home Equipment: None          Prior Functioning/Environment Level of Independence: Independent                 OT Problem List: Decreased strength;Decreased activity tolerance;Decreased knowledge of use of DME or AE;Impaired balance (sitting and/or standing);Cardiopulmonary status limiting activity;Decreased cognition      OT Treatment/Interventions:      OT Goals(Current goals can be found in the care plan section) Acute Rehab OT Goals Patient Stated Goal: to go home OT Goal Formulation: With patient Time For Goal Achievement: 01/08/19 Potential to Achieve Goals: Good  OT Frequency:     Barriers to D/C:            Co-evaluation              AM-PAC OT "6 Clicks" Daily Activity     Outcome Measure Help from another person eating meals?: None Help from another person taking care of personal grooming?: A Little Help from another person toileting, which includes using toliet, bedpan, or urinal?: A Little Help from another person bathing (including washing, rinsing, drying)?: A Little Help from another person to put on and taking off regular upper body clothing?: None Help from another person to put on and taking off regular lower body clothing?: A Little 6 Click Score: 20   End of Session Equipment Utilized During Treatment: Gait  belt  Activity Tolerance: Patient tolerated treatment well Patient left: in chair;with chair alarm set;with call bell/phone within reach  OT Visit Diagnosis: Other abnormalities of gait and mobility (R26.89)                Time: 4235-3614 OT Time Calculation (min): 23 min Charges:  OT General Charges $OT Visit: 1 Visit OT Evaluation $OT Eval Moderate Complexity: 1 Mod OT Treatments $Self Care/Home Management : 8-22 mins  Zenovia Jarred, MSOT, OTR/L Behavioral Health OT/ Acute Relief OT [facility] Office: [place] 12/25/2018, 1:35 PM

## 2018-12-25 NOTE — Discharge Summary (Addendum)
Physician Discharge Summary  Tracey Holt MRN: [id number] DOB/AGE: [id number] 83 y.o.  PCP: Mackie Pai, PA-C   Admit date: 12/23/2018 Discharge date: 12/25/2018  Discharge Diagnoses:    Active Problems:   S/P placement of cardiac pacemaker   Abnormal CT of the chest  c/w lipoid pna   Chronic systolic congestive heart failure (HCC)   Hyponatremia syndrome   Hyponatremia    Follow-up recommendations Follow-up with PCP in 3-5 days , including all  additional recommended appointments as below Follow-up CBC, CMP in 3-5 days   Diet Recommendation Regular;Thin liquid   Liquid Administration via: Cup;Straw Medication Administration: Whole meds with liquid Supervision: Patient able to self feed Compensations: Slow rate;Small sips/bites Postural Changes: Remain upright for at least 30 minutes after po intake;Seated upright at 90 degrees         Allergies as of 12/25/2018      Reactions   Dust Mite Extract Shortness Of Breath   Levaquin [levofloxacin In D5w] Shortness Of Breath   Lidex [fluocinonide] Swelling   Caused lip swelling   Molds & Smuts Shortness Of Breath   Rifampin Shortness Of Breath   Lactose Other (See Comments)   unknown   Penicillins Swelling   Did it involve swelling of the face/tongue/throat, SOB, or low BP? No Did it involve sudden or severe rash/hives, skin peeling, or any reaction on the inside of your mouth or nose? Unknown Did you need to seek medical attention at a hospital or doctor's office? Unknown When did it last happen?unk If all above answers are "NO", may proceed with cephalosporin use.      Medication List    STOP taking these medications   acetaminophen 500 MG tablet Commonly known as:  TYLENOL   sulfamethoxazole-trimethoprim 800-160 MG tablet Commonly known as:  BACTRIM DS,SEPTRA DS     TAKE these medications   albuterol 108 (90 Base) MCG/ACT inhaler Commonly known as:  PROVENTIL HFA;VENTOLIN HFA Inhale 2 puffs into  the lungs every 6 (six) hours as needed for wheezing or shortness of breath.   amLODipine 2.5 MG tablet Commonly known as:  NORVASC TAKE 1 TABLET BY MOUTH EVERY DAY   atorvastatin 10 MG tablet Commonly known as:  LIPITOR Take 1/2 tablet (5mg ) by mouth daily What changed:    how much to take  how to take this  when to take this  additional instructions   benzonatate 100 MG capsule Commonly known as:  TESSALON Take 1 capsule (100 mg total) by mouth 3 (three) times daily as needed for cough.   Biotin 10000 MCG Tabs Take 10,000 mcg by mouth daily.   docusate sodium 100 MG capsule Commonly known as:  COLACE Take 100 mg by mouth daily as needed for mild constipation.   guaiFENesin-dextromethorphan 100-10 MG/5ML syrup Commonly known as:  ROBITUSSIN DM Take 5 mLs by mouth every 4 (four) hours as needed (chest congestion).   hydroxypropyl methylcellulose / hypromellose 2.5 % ophthalmic solution Commonly known as:  ISOPTO TEARS / GONIOVISC Place 1 drop into both eyes 3 (three) times daily.   ipratropium 17 MCG/ACT inhaler Commonly known as:  ATROVENT HFA Inhale 2 puffs into the lungs every 6 (six) hours as needed for wheezing.   levocetirizine 5 MG tablet Commonly known as:  XYZAL Take 5 mg by mouth every evening.   multivitamin with minerals tablet Take 1 tablet by mouth daily.   mupirocin cream 2 % Commonly known as:  BACTROBAN Apply 1 application topically 2 (two) times daily.  vitamin C 1000 MG tablet Take 1,000 mg by mouth daily.   Protonix 40 mg PO daily      Discharge Condition:  stable  Discharge Instructions Get Medicines reviewed and adjusted: Please take all your medications with you for your next visit with your Primary MD  Please request your Primary MD to go over all hospital tests and procedure/radiological results at the follow up, please ask your Primary MD to get all Hospital records sent to his/her office.  If you experience worsening of  your admission symptoms, develop shortness of breath, life threatening emergency, suicidal or homicidal thoughts you must seek medical attention immediately by calling 911 or calling your MD immediately  if symptoms less severe.  You must read complete instructions/literature along with all the possible adverse reactions/side effects for all the Medicines you take and that have been prescribed to you. Take any new Medicines after you have completely understood and accpet all the possible adverse reactions/side effects.   Do not drive when taking Pain medications.   Do not take more than prescribed Pain, Sleep and Anxiety Medications  Special Instructions: If you have smoked or chewed Tobacco  in the last 2 yrs please stop smoking, stop any regular Alcohol  and or any Recreational drug use.  Wear Seat belts while driving.  Please note  You were cared for by a hospitalist during your hospital stay. Once you are discharged, your primary care physician will handle any further medical issues. Please note that NO REFILLS for any discharge medications will be authorized once you are discharged, as it is imperative that you return to your primary care physician (or establish a relationship with a primary care physician if you do not have one) for your aftercare needs so that they can reassess your need for medications and monitor your lab values.     Allergies  Allergen Reactions  . Dust Mite Extract Shortness Of Breath  . Levaquin [Levofloxacin In D5w] Shortness Of Breath  . Lidex [Fluocinonide] Swelling    Caused lip swelling  . Molds & Smuts Shortness Of Breath  . Rifampin Shortness Of Breath  . Lactose Other (See Comments)    unknown  . Penicillins Swelling    Did it involve swelling of the face/tongue/throat, SOB, or low BP? No Did it involve sudden or severe rash/hives, skin peeling, or any reaction on the inside of your mouth or nose? Unknown Did you need to seek medical attention at a  hospital or doctor's office? Unknown When did it last happen?unk If all above answers are "NO", may proceed with cephalosporin use.       Disposition: Discharge disposition: 01-Home or Self Care        Consults:  nephrology    Significant Diagnostic Studies:  Dg Chest 2 View  Result Date: 12/23/2018 CLINICAL DATA:  New onset left arm and left chest pain with shortness of breath EXAM: CHEST - 2 VIEW COMPARISON:  12/15/2018 FINDINGS: Chronic cardiomegaly and aortic tortuosity. Dual-chamber pacer leads from the left. In the area of suspected pneumonia prior study there is a EKG lead today. No clear consolidation. Trace pleural fluid on both sides versus atelectasis. Diaphragm flattening correlating with history of COPD. Thoracic kyphosis from multilevel compression fracture. IMPRESSION: 1. No definite acute disease. 2. COPD and cardiomegaly. Electronically Signed   By: Monte Fantasia M.D.   On: 12/23/2018 07:44   Dg Chest 2 View  Result Date: 12/15/2018 CLINICAL DATA:  Cough for 10 days, COPD, pacemaker,  history COPD, sarcoidosis, cardiomyopathy, former smoker EXAM: CHEST - 2 VIEW COMPARISON:  12/06/2018 FINDINGS: LEFT subclavian transvenous pacemaker leads project at RIGHT atrium and RIGHT ventricle unchanged. Enlargement of cardiac silhouette. Atherosclerotic calcification aorta. Mediastinal contours and pulmonary vascularity normal. Emphysematous and minimal bronchitic changes consistent with COPD. Probable small calcified RIGHT hilar lymph nodes. Chronic streaky atelectasis LEFT base. New LEFT mid lung opacity since 12/06/2018 question pneumonia. Bibasilar atelectasis. No pneumothorax. Osseous demineralization with multiple thoracic compression fractures and accentuated thoracic kyphosis. IMPRESSION: COPD changes with bibasilar atelectasis and suspect developing LEFT perihilar pneumonia. Electronically Signed   By: Lavonia Dana M.D.   On: 12/15/2018 16:27   Dg Shoulder 1v  Left  Result Date: 12/23/2018 CLINICAL DATA:  Shortness of breath.  Left arm pain. EXAM: LEFT SHOULDER - 1 VIEW COMPARISON:  CT 12/23/2017.  Chest x-ray 12/23/2017. FINDINGS: Diffuse osteopenia. Acromioclavicular and glenohumeral degenerative change. No acute bony abnormality. Cardiac pacer noted. Left base atelectasis/infiltrate and small left pleural effusion. IMPRESSION: 1. Diffuse osteopenia. Acromioclavicular glenohumeral degenerative change. No acute abnormality. 2. Left base atelectasis/infiltrate and small left pleural effusion. 3.  Cardiac pacer noted. Electronically Signed   By: Marcello Moores  Register   On: 12/23/2018 14:03   Dg Abd 1 View  Result Date: 12/23/2018 CLINICAL DATA:  Abdominal pain and constipation. EXAM: ABDOMEN - 1 VIEW COMPARISON:  Abdomen pelvis CT dated 04/07/2018. Chest CTA obtained earlier today. FINDINGS: Normal bowel gas pattern with a paucity of intestinal gas. No significant stool visualized. Calcific density overlying the left pelvis with an appearance suggesting an image artifact. Moderate levoconvex thoracolumbar rotary scoliosis. Previously described chronic opacity in the left lung. Enlarged heart and pacemaker leads. IMPRESSION: No acute abnormality. Electronically Signed   By: Claudie Revering M.D.   On: 12/23/2018 18:11   Ct Head Wo Contrast  Result Date: 12/24/2018 CLINICAL DATA:  Altered mental status.  Double vision.  Confusion. EXAM: CT HEAD WITHOUT CONTRAST TECHNIQUE: Contiguous axial images were obtained from the base of the skull through the vertex without intravenous contrast. COMPARISON:  12/06/2018 and multiple previous FINDINGS: Brain: No sign of acute or subacute infarction. The brainstem and cerebellum are normal. Cerebral hemispheres show age related volume loss with mild chronic small-vessel change of the white matter. No large vessel territory infarction. No mass lesion, hemorrhage, hydrocephalus or extra-axial collection. Vascular: There is atherosclerotic  calcification of the major vessels at the base of the brain. Skull: Negative Sinuses/Orbits: Clear/normal Other: None IMPRESSION: No acute finding by CT. Mild age related volume loss and minimal small vessel change of the hemispheric white matter. Electronically Signed   By: Nelson Chimes M.D.   On: 12/24/2018 12:29   Ct Head Wo Contrast  Result Date: 12/06/2018 CLINICAL DATA:  Severe headache EXAM: CT HEAD WITHOUT CONTRAST TECHNIQUE: Contiguous axial images were obtained from the base of the skull through the vertex without intravenous contrast. COMPARISON:  Head CT 08/13/2018 FINDINGS: Brain: There is no mass, hemorrhage or extra-axial collection. The size and configuration of the ventricles and extra-axial CSF spaces are normal. There is hypoattenuation of the white matter, most commonly indicating chronic small vessel disease. Vascular: No abnormal hyperdensity of the major intracranial arteries or dural venous sinuses. No intracranial atherosclerosis. Skull: The visualized skull base, calvarium and extracranial soft tissues are normal. Sinuses/Orbits: No fluid levels or advanced mucosal thickening of the visualized paranasal sinuses. No mastoid or middle ear effusion. The orbits are normal. IMPRESSION: Chronic small vessel ischemia without acute intracranial abnormality. Electronically Signed  By: Ulyses Jarred M.D.   On: 12/06/2018 06:24   Ct Angio Chest Pe W And/or Wo Contrast  Result Date: 12/23/2018 CLINICAL DATA:  Pneumonia, unresolved for complicated EXAM: CT ANGIOGRAPHY CHEST WITH CONTRAST TECHNIQUE: Multidetector CT imaging of the chest was performed using the standard protocol during bolus administration of intravenous contrast. Multiplanar CT image reconstructions and MIPs were obtained to evaluate the vascular anatomy. CONTRAST:  193mL ISOVUE-370 IOPAMIDOL (ISOVUE-370) INJECTION 76% COMPARISON:  Chest x-ray from earlier today.  PET-CT 10/12/2016 FINDINGS: Cardiovascular: Cardiomegaly without  pericardial effusion. Dual-chamber pacer from the left with leads at the apical right ventricular septum and right atrial appendage. Satisfactory opacification of the pulmonary arteries. Accounting for intermittent motion there is no evident pulmonary embolism. Mediastinum/Nodes: Negative for adenopathy. There is a small hiatal hernia and likely some fluid within the lower esophagus. Lungs/Pleura: Chronic triangular opacity in the lingula with associated airway collapse. This has been noted since at least 2017 with stable to regressed size, compatible with a benign process. The opacity is again very low-density, with lipoid pneumonia question on prior study. There is bronchomalacia with dynamic central airway collapse/narrowing. Dependent atelectasis. No acute consolidation, edema, or significant pleural fluid. Upper Abdomen: Due to contrast timing and paucity of intra-abdominal fat there is limited assessment. Musculoskeletal: Kyphoscoliosis and degenerative disease. Remote appearing T6, T7, T8, T9, and T10 compression fractures. Apparent defect at the mid sternal body attributed to motion artifact. No step-off on preceding chest x-ray. Review of the MIP images confirms the above findings. IMPRESSION: 1. Negative for pulmonary embolism or other acute finding. 2. No significant change in chronic lingular consolidation when compared to 2017 PET. Lipoid pneumonia is again considered given the low-density. 3. Tracheobronchomalacia and kyphoscoliosis with mild dependent atelectasis. Electronically Signed   By: Monte Fantasia M.D.   On: 12/23/2018 10:37   Dg Chest Port 1 View  Result Date: 12/06/2018 CLINICAL DATA:  Shortness of breath and cough EXAM: PORTABLE CHEST 1 VIEW COMPARISON:  October 14, 2018 FINDINGS: There is mild scarring in the bases. There is no appreciable edema or consolidation. Heart size and pulmonary vascular normal. Pacemaker leads are attached to the right atrium and right ventricle. There is  aortic atherosclerosis. Bones are osteoporotic. No adenopathy is appreciable. IMPRESSION: Bibasilar scarring. No edema or consolidation. Stable cardiac silhouette. There is aortic atherosclerosis. Bones osteoporotic. Aortic Atherosclerosis (ICD10-I70.0). Electronically Signed   By: Lowella Grip III M.D.   On: 12/06/2018 08:40        Filed Weights   12/23/18 1800  Weight: 45.8 kg     Microbiology: Recent Results (from the past 240 hour(s))  MRSA PCR Screening     Status: None   Collection Time: 12/23/18  7:20 PM  Result Value Ref Range Status   MRSA by PCR NEGATIVE NEGATIVE Final    Comment:        The GeneXpert MRSA Assay (FDA approved for NASAL specimens only), is one component of a comprehensive MRSA colonization surveillance program. It is not intended to diagnose MRSA infection nor to guide or monitor treatment for MRSA infections. Performed at [facility], [place] [address], [place], [place] [address]        Blood Culture    Component Value Date/Time   SDES BLOOD BLOOD RIGHT HAND 08/13/2018 1222   SDES BLOOD RIGHT ANTECUBITAL 08/13/2018 1222   SPECREQUEST  08/13/2018 1222    BOTTLES DRAWN AEROBIC ONLY Blood Culture results may not be optimal due to an inadequate volume  of blood received in culture bottles   SPECREQUEST  08/13/2018 1222    BOTTLES DRAWN AEROBIC ONLY Blood Culture adequate volume   CULT  08/13/2018 1222    NO GROWTH 5 DAYS Performed at [facility], [place] [address], [place], [place] [address]    CULT  08/13/2018 1222    NO GROWTH 5 DAYS Performed at [facility], [place] [address] [place], [place] [address]    REPTSTATUS 08/18/2018 FINAL 08/13/2018 1222   REPTSTATUS 08/18/2018 FINAL 08/13/2018 1222      Labs: Results for orders placed or performed during the hospital encounter of 12/23/18 (from the past 48 hour(s))  Basic metabolic panel     Status: Abnormal   Collection Time: 12/23/18  2:55 PM  Result  Value Ref Range   Sodium 118 (LL) 135 - 145 mmol/L    Comment: CRITICAL RESULT CALLED TO, READ BACK BY AND VERIFIED WITH: M.TEUP AT 1532 ON 12/23/18 BY N.THOMPSON    Potassium 3.4 (L) 3.5 - 5.1 mmol/L   Chloride 86 (L) 98 - 111 mmol/L   CO2 23 22 - 32 mmol/L   Glucose, Bld 193 (H) 70 - 99 mg/dL   BUN 7 (L) 8 - 23 mg/dL   Creatinine, Ser 0.46 0.44 - 1.00 mg/dL   Calcium 7.9 (L) 8.9 - 10.3 mg/dL   GFR calc non Af Amer >60 >60 mL/min   GFR calc Af Amer >60 >60 mL/min   Anion gap 9 5 - 15    Comment: Performed at [facility], [place] [address], [place], [place] [address]  Osmolality, urine     Status: None   Collection Time: 12/23/18  4:43 PM  Result Value Ref Range   Osmolality, Ur 301 300 - 900 mOsm/kg    Comment: Performed at [place] [address], [place], [place] [address]  Na and K (sodium & potassium), rand urine     Status: None   Collection Time: 12/23/18  4:43 PM  Result Value Ref Range   Sodium, Ur 33 mmol/L   Potassium Urine 23 mmol/L    Comment: Performed at [facility], [place] [address] [place], [place] [address]  Urinalysis, Routine w reflex microscopic     Status: Abnormal   Collection Time: 12/23/18  4:43 PM  Result Value Ref Range   Color, Urine STRAW (A) YELLOW   APPearance CLEAR CLEAR   Specific Gravity, Urine 1.025 1.005 - 1.030   pH 6.0 5.0 - 8.0   Glucose, UA 150 (A) NEGATIVE mg/dL   Hgb urine dipstick NEGATIVE NEGATIVE   Bilirubin Urine NEGATIVE NEGATIVE   Ketones, ur 5 (A) NEGATIVE mg/dL   Protein, ur NEGATIVE NEGATIVE mg/dL   Nitrite NEGATIVE NEGATIVE   Leukocytes, UA NEGATIVE NEGATIVE    Comment: Performed at [place] [address], [place], [place] [address]  Basic metabolic panel     Status: Abnormal   Collection Time: 12/23/18  4:57 PM  Result Value Ref Range   Sodium 118 (LL) 135 - 145 mmol/L    Comment: CRITICAL RESULT CALLED TO, READ BACK BY AND VERIFIED WITH: K.RALL AT  1828 ON 12/23/18 BY N.THOMPSON    Potassium 6.3 (HH) 3.5 - 5.1 mmol/L    Comment: DELTA CHECK NOTED REPEATED TO VERIFY NO VISIBLE HEMOLYSIS CRITICAL RESULT CALLED TO, READ BACK BY AND VERIFIED WITH: K.RALL AT 1842 ON 12/23/18 BY N.THOMPSON    Chloride 89 (L) 98 -  111 mmol/L   CO2 19 (L) 22 - 32 mmol/L   Glucose, Bld 165 (H) 70 - 99 mg/dL   BUN 6 (L) 8 - 23 mg/dL   Creatinine, Ser 0.42 (L) 0.44 - 1.00 mg/dL   Calcium 7.6 (L) 8.9 - 10.3 mg/dL   GFR calc non Af Amer >60 >60 mL/min   GFR calc Af Amer >60 >60 mL/min   Anion gap 10 5 - 15    Comment: Performed at [facility] [place] [address], [place], [place] [address]  Osmolality     Status: Abnormal   Collection Time: 12/23/18  4:57 PM  Result Value Ref Range   Osmolality 251 (L) 275 - 295 mOsm/kg    Comment: PERFORMED AT [facility] Performed at [facility], [place] [address] [place], [place] [address]   Uric acid     Status: Abnormal   Collection Time: 12/23/18  4:57 PM  Result Value Ref Range   Uric Acid, Serum 2.0 (L) 2.5 - 7.1 mg/dL    Comment: Performed at [facility], [place] [address] [place], [place] [address]  TSH     Status: None   Collection Time: 12/23/18  4:57 PM  Result Value Ref Range   TSH 4.139 0.350 - 4.500 uIU/mL    Comment: Performed by a 3rd Generation assay with a functional sensitivity of <=0.01 uIU/mL. Performed at [facility], [place] [address], [place], [place] [address]   Basic metabolic panel     Status: Abnormal   Collection Time: 12/23/18  7:13 PM  Result Value Ref Range   Sodium 121 (L) 135 - 145 mmol/L   Potassium 4.3 3.5 - 5.1 mmol/L    Comment: DELTA CHECK NOTED REPEATED TO VERIFY NO VISIBLE HEMOLYSIS    Chloride 90 (L) 98 - 111 mmol/L   CO2 23 22 - 32 mmol/L   Glucose, Bld 155 (H) 70 - 99 mg/dL   BUN 6 (L) 8 - 23 mg/dL   Creatinine, Ser 0.38 (L) 0.44 - 1.00 mg/dL   Calcium 8.2 (L) 8.9 - 10.3 mg/dL   GFR calc non Af Amer  >60 >60 mL/min   GFR calc Af Amer >60 >60 mL/min   Anion gap 8 5 - 15    Comment: Performed at [facility], [place] [address] [place], [place] [address]  MRSA PCR Screening     Status: None   Collection Time: 12/23/18  7:20 PM  Result Value Ref Range   MRSA by PCR NEGATIVE NEGATIVE    Comment:        The GeneXpert MRSA Assay (FDA approved for NASAL specimens only), is one component of a comprehensive MRSA colonization surveillance program. It is not intended to diagnose MRSA infection nor to guide or monitor treatment for MRSA infections. Performed at [facility], [place] [address], [place], [place] [address]   Basic metabolic panel     Status: Abnormal   Collection Time: 12/23/18 10:59 PM  Result Value Ref Range   Sodium 127 (L) 135 - 145 mmol/L   Potassium 4.1 3.5 - 5.1 mmol/L   Chloride 98 98 - 111 mmol/L   CO2 22 22 - 32 mmol/L   Glucose, Bld 155 (H) 70 - 99 mg/dL   BUN 6 (L) 8 - 23 mg/dL   Creatinine, Ser 0.33 (L) 0.44 - 1.00 mg/dL   Calcium 8.3 (L) 8.9 - 10.3 mg/dL   GFR calc non  Af Amer >60 >60 mL/min   GFR calc Af Amer >60 >60 mL/min   Anion gap 7 5 - 15    Comment: Performed at [facility], [place] [address], [place], [place] [address]  Basic metabolic panel     Status: Abnormal   Collection Time: 12/24/18  2:43 AM  Result Value Ref Range   Sodium 131 (L) 135 - 145 mmol/L   Potassium 4.2 3.5 - 5.1 mmol/L   Chloride 102 98 - 111 mmol/L   CO2 22 22 - 32 mmol/L   Glucose, Bld 121 (H) 70 - 99 mg/dL   BUN 7 (L) 8 - 23 mg/dL   Creatinine, Ser 0.40 (L) 0.44 - 1.00 mg/dL   Calcium 8.3 (L) 8.9 - 10.3 mg/dL   GFR calc non Af Amer >60 >60 mL/min   GFR calc Af Amer >60 >60 mL/min   Anion gap 7 5 - 15    Comment: Performed at [facility], [place] [address], [place], [place] [address]  CBC     Status: Abnormal   Collection Time: 12/24/18  2:43 AM  Result Value Ref Range   WBC 6.4 4.0 - 10.5 K/uL    RBC 3.79 (L) 3.87 - 5.11 MIL/uL   Hemoglobin 10.8 (L) 12.0 - 15.0 g/dL   HCT 33.1 (L) 36.0 - 46.0 %   MCV 87.3 80.0 - 100.0 fL   MCH 28.5 26.0 - 34.0 pg   MCHC 32.6 30.0 - 36.0 g/dL   RDW 13.9 11.5 - 15.5 %   Platelets 185 150 - 400 K/uL   nRBC 0.0 0.0 - 0.2 %    Comment: Performed at [facility], [place] [address] [place], [place] [address]  Basic metabolic panel     Status: Abnormal   Collection Time: 12/24/18  1:49 PM  Result Value Ref Range   Sodium 133 (L) 135 - 145 mmol/L   Potassium 4.6 3.5 - 5.1 mmol/L   Chloride 101 98 - 111 mmol/L   CO2 25 22 - 32 mmol/L   Glucose, Bld 114 (H) 70 - 99 mg/dL   BUN 12 8 - 23 mg/dL   Creatinine, Ser 0.53 0.44 - 1.00 mg/dL   Calcium 8.6 (L) 8.9 - 10.3 mg/dL   GFR calc non Af Amer >60 >60 mL/min   GFR calc Af Amer >60 >60 mL/min   Anion gap 7 5 - 15    Comment: Performed at [facility], [place] [address], [place], [place] [address]  Basic metabolic panel     Status: Abnormal   Collection Time: 12/24/18  4:53 PM  Result Value Ref Range   Sodium 132 (L) 135 - 145 mmol/L   Potassium 4.5 3.5 - 5.1 mmol/L   Chloride 102 98 - 111 mmol/L   CO2 24 22 - 32 mmol/L   Glucose, Bld 94 70 - 99 mg/dL   BUN 13 8 - 23 mg/dL   Creatinine, Ser 0.42 (L) 0.44 - 1.00 mg/dL   Calcium 8.5 (L) 8.9 - 10.3 mg/dL   GFR calc non Af Amer >60 >60 mL/min   GFR calc Af Amer >60 >60 mL/min   Anion gap 6 5 - 15    Comment: Performed at [facility], [place] [address], [place], [place] [address]  Comprehensive metabolic panel     Status: Abnormal   Collection Time: 12/25/18  3:54 AM  Result Value Ref Range   Sodium 131 (L) 135 -  145 mmol/L   Potassium 3.6 3.5 - 5.1 mmol/L    Comment: DELTA CHECK NOTED   Chloride 100 98 - 111 mmol/L   CO2 27 22 - 32 mmol/L   Glucose, Bld 92 70 - 99 mg/dL   BUN 11 8 - 23 mg/dL   Creatinine, Ser 0.38 (L) 0.44 - 1.00 mg/dL   Calcium 8.3 (L) 8.9 - 10.3 mg/dL   Total Protein 5.3 (L)  6.5 - 8.1 g/dL   Albumin 3.3 (L) 3.5 - 5.0 g/dL   AST 46 (H) 15 - 41 U/L   ALT 49 (H) 0 - 44 U/L   Alkaline Phosphatase 33 (L) 38 - 126 U/L   Total Bilirubin 0.8 0.3 - 1.2 mg/dL   GFR calc non Af Amer >60 >60 mL/min   GFR calc Af Amer >60 >60 mL/min   Anion gap 4 (L) 5 - 15    Comment: Performed at [facility], [place] [address] [place], [place] [address]     Lipid Panel     Component Value Date/Time   CHOL 145 09/19/2017 1450   TRIG 72.0 09/19/2017 1450   HDL 69.40 09/19/2017 1450   CHOLHDL 2 09/19/2017 1450   VLDL 14.4 09/19/2017 1450   LDLCALC 61 09/19/2017 1450     No results found for: HGBA1C   Lab Results  Component Value Date   LDLCALC 61 09/19/2017   CREATININE 0.38 (L) 12/25/2018     HPI    85 y.o.female.With history of bradycardia, syncope, cardiac arrest status post pacemaker, CHF with EF of 45 to 50%, COPD, hypertension presented from assisted living facility for the evaluation of shortness of breath. Patient has been treated with multiple course of antibiotics for pneumonia. Reportedly patient was recently started on Bactrim for the management of left lower lobe pneumonia. In the ER patient was found to have serum sodium level of 116, potassium 3, creatinine 0.36. Nephrology was consulted for the evaluation of hyponatremia  Hospital course #Hyponatremia likely due to decreased solute intake and dehydration. She is not on diuretics. She may have SIADH due to chronic lung disease and pneumonia. CT scan of chest negative for any mass, she has lipoid pneumonia.  .-patient has been hydrated with normal saline. Sodium has improved from 118> 131>133 .   serial  BMP has remained stable.  urine osmolality is 301. Urine sodium is 33. TSH within normal limits. Uric acid 2.0. this morning   #Hypokalemia: Patient received IV potassium chloride.  Marland Kitchen  #COPD exacerbation patient received IV Solu-Medrol and nebulizer treatments , wheezing has  improved,DC'd  IV steroids, switched to oral prednisone which she has completed, patient has completed treatment for pneumonia  #History of hypertension: Blood pressure acceptable. Continue to monitor.  #Acute metabolic encephalopathy; multifactorial, related to pain, urinary retension, hyponatremia, electrolytes abnormalities. Improving CT negative for acute stroke     5-Chronic lipoid PNA; aspiration precaution.  Speech evaluation.     6-chronic systolic heart failure: Monitor on IV fluids. Peers dehydrated. No echo done since 2014  7-Diarrhea; Suspect related to laxative. Patient was also on Bactrim. This was discontinued.  no diarrhea since admission  8. Dyslipidemia-continue statin  9.Cardiomyopathy/bradycardic cardiac arrest status post pacemaker:  -No acute issues. Follow-up as outpatient.  -Not on any diuretics and does not appear to be volume overloaded.  -Last EF was 45 to 50%.     Discharge Exam:  Blood pressure 123/72, pulse 76, temperature 98.2 F (36.8 C), temperature source Oral, resp.  rate 20, height 4\' 9"  (1.448 m), weight 45.8 kg, SpO2 92 %.  General: Pt is alert, awake, not in acute distress Cardiovascular: RRR, S1/S2 +, no rubs, no gallops Respiratory: CTA bilaterally, no wheezing, no rhonchi Abdominal: Soft, NT, ND, bowel sounds + Extremities: no edema, no cyanosis    Follow-up Information    Saguier, Percell Miller, PA-C. Call.   Specialties:  Internal Medicine, Family Medicine Why:  Hospital follow-up in 3-5 days Contact information: [address] [place] [address] [phone]           Signed: Reyne Dumas 12/25/2018, 8:36 AM      Time needed to  prepare  discharge, discussed with the patient and family 55 minutes

## 2018-12-25 NOTE — Evaluation (Signed)
Clinical/Bedside Swallow Evaluation Patient Details  Name: Tracey Holt MRN: [id number] Date of Birth: [birth year]  Today's Date: 12/25/2018 Time: SLP Start Time (ACUTE ONLY): 3329 SLP Stop Time (ACUTE ONLY): 1238 SLP Time Calculation (min) (ACUTE ONLY): 34 min  Past Medical History:  Past Medical History:  Diagnosis Date  . Bradycardic cardiac arrest ([place]) 07/24/2016   Required CPR, intubation with multiple rib fractures. Had short term cardiac shock with acute heart failure.  Resulted in possible stress-induced cardiomyopathy  . Cardiac related syncope 07/24/2016   Bradycardic arrest requiring CPR 2 with transvenous pacemaker placed followed by permanent pacemaker; complicated by respiratory arrest, type II MI, stress-induced cardiopathy  . Cardiomyopathy ([place]) 07/2016   Moderate severely reduced EF with apical hypokinesis suggestive of stress-induced cardiac myopathy versus multivessel CAD --> in setting of her to cardiac arrest  . COPD (chronic obstructive pulmonary disease) ([place])    By report  . History of chronic constipation   . Hypercholesterolemia   . Osteoporosis   . Pacemaker 07/26/2016   Abbott dual-chamber pacemaker: [place] #[id number] - Serial [id number]  . Pneumonia 08/2016  . Sarcoidosis of lung ([place])   . Spinal stenosis    Past Surgical History:  Past Surgical History:  Procedure Laterality Date  . ADENOIDECTOMY    . PACEMAKER INSERTION Left 07/26/2016   Abbott Assurity Model #[id number] - Serial #[id number] -- complicated by pneumothorax requiring chest tube placement  . TEE WITHOUT CARDIOVERSION  02/2013   EF 55-60% with normal global function. No thrombus, mass or vegetation. Trivial-small pericardial effusion. Moderate LA dilation. Mild MR.  . TONSILLECTOMY    . TRANSTHORACIC ECHOCARDIOGRAM  07/24/2016   [facility]: EF 45-50%. Mid and apical inferior, inferolateral and apical hypokinesis. GR 2 DD. Mild LA and moderate RA dilation.  Mild paracardial effusion. Moderate to severe TR. Basal and mid segment hypokinesis with apical hypokinesis. - Suspect stress-induced cardiopathy versus multivessel CAD.  Marland Kitchen TRANSTHORACIC ECHOCARDIOGRAM  07/26/2016   [place]: LIMITED (post PPM) - although the report suggested EF 30-35% with apical akinesis, rounding note suggested this was an improvement   HPI:  Pt adm to [facility] with hyponatremia, SIADH versus dehydration.  Sodium was elevated upon admit.  Swallow evaluation ordered.  PMH + for sarcoidosis, pna, heart block, hyponatremia in medical office visits recently. Pt had been in the hospital in the fall complaining of foreign body obstruction and CT neck was negative.  She is NOT on a PPI but complained of burning today.      Assessment / Plan / Recommendation Clinical Impression  Pt presents with functional oropharyngeal swallow ability based on clinical swallow.  No indication of aspiration with all po observed.  Pt did report burning sensation in throat and esophagus and milk products provided which she reported were helpful.  She is not on a PPI and given hx of concern for reflux noted in fall 2019, ? if would be beneficial for her to take a PPI.  No SlP follow up indicated.   SLP Visit Diagnosis: Dysphagia, unspecified (R13.10)    Aspiration Risk  Mild aspiration risk    Diet Recommendation Regular;Thin liquid   Liquid Administration via: Cup;Straw Medication Administration: Whole meds with liquid Supervision: Patient able to self feed Compensations: Slow rate;Small sips/bites Postural Changes: Remain upright for at least 30 minutes after po intake;Seated upright at 90 degrees    Other  Recommendations Oral Care Recommendations: Oral care BID   Follow up Recommendations None  Frequency and Duration   n/a         Prognosis   n/a     Swallow Study   General Date of Onset: 12/25/18 HPI: Pt adm to [facility] with hyponatremia, SIADH versus  dehydration.  Sodium was elevated upon admit.  Swallow evaluation ordered.  PMH + for sarcoidosis, pna, heart block, hyponatremia in medical office visits recently. Pt had been in the hospital in the fall complaining of foreign body obstruction and CT neck was negative.  She is NOT on a PPI but complained of burning today.    Type of Study: Bedside Swallow Evaluation Previous Swallow Assessment: none Diet Prior to this Study: Thin liquids;Dysphagia 3 (soft) Temperature Spikes Noted: No Respiratory Status: Room air History of Recent Intubation: No Behavior/Cognition: Alert;Cooperative;Pleasant mood Oral Cavity Assessment: Within Functional Limits Oral Care Completed by SLP: No Oral Cavity - Dentition: Dentures, top;Dentures, bottom Vision: Functional for self-feeding Self-Feeding Abilities: Able to feed self Patient Positioning: Upright in bed Baseline Vocal Quality: Normal Volitional Cough: Strong Volitional Swallow: Able to elicit    Oral/Motor/Sensory Function Overall Oral Motor/Sensory Function: Within functional limits   Ice Chips Ice chips: Not tested   Thin Liquid Thin Liquid: Within functional limits Presentation: Straw    Nectar Thick Nectar Thick Liquid: Not tested   Honey Thick Honey Thick Liquid: Not tested   Puree Puree: Within functional limits Presentation: Spoon   Solid     Solid: Not tested      Macario Golds 12/25/2018,2:05 PM  Luanna Salk, [facility] SLP Acute Rehab Services Pager [phone] Office [phone]

## 2018-12-25 NOTE — Progress Notes (Signed)
Patient is a resident at [facility]. RNCM following for discharge needs.

## 2018-12-25 NOTE — Evaluation (Signed)
Physical Therapy Evaluation Patient Details Name: Tracey Holt MRN: [id number] DOB: 07/25/1922 Today's Date: 12/25/2018   History of Present Illness  85 yo female admitted to ED on 2/4 for ShOB, hyponatremia, metabolic encephalopathy, and recent history of PNA with multiple rounds of antibiotics. PMH includes bradycardic cardiac arrest with pacemaker placement 2017, syncope, cardiomyopathy, COPD, osteoporosis, sarcoidosisof lumbar spinal stenosis, HTN.   Clinical Impression   Pt presents with dyspnea on exertion, generalized weakness, unsteadiness with challenges to gait, decreased activity tolerance, and decreased gait speed. Pt to benefit from acute PT to address deficits. Pt ambulated 180 ft without Ad with one standing rest break to recover dyspnea, sats 88-92% during ambulation per portable pulse oximeter. PT recommending HHPT for pt in her independent living facility to address deficits. PT to progress mobility as tolerated, and will continue to follow acutely.      Follow Up Recommendations Home health PT    Equipment Recommendations  None recommended by PT    Recommendations for Other Services       Precautions / Restrictions Precautions Precautions: Fall Restrictions Weight Bearing Restrictions: No      Mobility  Bed Mobility Overal bed mobility: Needs Assistance Bed Mobility: Supine to Sit     Supine to sit: Supervision     General bed mobility comments: Pt up in chair upon PT arrival, but per OT note pt supervision.   Transfers Overall transfer level: Needs assistance Equipment used: None Transfers: Sit to/from Stand Sit to Stand: Min guard         General transfer comment: Min guard for safety, slow to rise. When PT putting telemetry monitor in pt's gown, pt with LOB due to weight shift. Unsteadiness corrected by PT and pt reaching for arm of recliner.   Ambulation/Gait Ambulation/Gait assistance: Supervision;Min guard Gait Distance (Feet): 180  Feet Assistive device: None Gait Pattern/deviations: Step-through pattern;Decreased stride length;Shuffle;Decreased step length - left Gait velocity: decr - 25 ft gait speed 1.39 ft/sec Gait velocity interpretation: 1.31 - 2.62 ft/sec, indicative of limited community ambulator General Gait Details: supervision to min guard for safety. Pt with no LOB, but takes short, shuffling steps and has decreased stride length on L, pt reported due to LLE pain which is baseline. Pt satting 90-92% for most of ambulation, pt down to 88% at one point with mild dyspnea. When sats <90%, pt instructed to take standing rest break to recover sats. Pt with slight unsteadiness during ambulation, and pt states she is typically very steady but she has not walked in a couple of days.   Stairs            Wheelchair Mobility    Modified Rankin (Stroke Patients Only)       Balance Overall balance assessment: Needs assistance Sitting-balance support: Feet supported;No upper extremity supported Sitting balance-Leahy Scale: Good     Standing balance support: During functional activity Standing balance-Leahy Scale: Fair Standing balance comment: some unsteadiness noted during standing, unable to accept challenge to standing balance statically or dynamically                              Pertinent Vitals/Pain Pain Assessment: No/denies pain Faces Pain Scale: Hurts a little bit Pain Location: reports burning in the throat and esophagus, provided milk products to which she reported was helpful Pain Descriptors / Indicators: Sore Pain Intervention(s): Monitored during session    Home Living Family/patient expects to be discharged to:: Assisted living([facility])               Home Equipment: None      Prior Function Level of Independence: Independent         Comments: Pt reports walking for 30 minutes a day, taking part in chair aerobics class, and walking to cafeteria to get  meals. Pt reports making herself breakfast.      Hand Dominance   Dominant Hand: Right    Extremity/Trunk Assessment   Upper Extremity Assessment Upper Extremity Assessment: Generalized weakness(per OT note)    Lower Extremity Assessment Lower Extremity Assessment: Generalized weakness    Cervical / Trunk Assessment Cervical / Trunk Assessment: Kyphotic  Communication   Communication: HOH  Cognition Arousal/Alertness: Awake/alert Behavior During Therapy: WFL for tasks assessed/performed Overall Cognitive Status: History of cognitive impairments - at baseline                                 General Comments: WFL for tasks observed.       General Comments      Exercises     Assessment/Plan    PT Assessment Patient needs continued PT services  PT Problem List Decreased balance;Decreased cognition;Cardiopulmonary status limiting activity;Decreased activity tolerance;Decreased mobility       PT Treatment Interventions Balance training;Patient/family education;Gait training;Therapeutic exercise;Therapeutic activities    PT Goals (Current goals can be found in the Care Plan section)  Acute Rehab PT Goals Patient Stated Goal: to go home PT Goal Formulation: With patient Time For Goal Achievement: 01/08/19 Potential to Achieve Goals: Good    Frequency Min 3X/week   Barriers to discharge        Co-evaluation               AM-PAC PT "6 Clicks" Mobility  Outcome Measure Help needed turning from your back to your side while in a flat bed without using bedrails?: A Little Help needed moving from lying on your back to sitting on the side of a flat bed without using bedrails?: A Little Help needed moving to and from a bed to a chair (including a wheelchair)?: A Little Help needed standing up from a chair using your arms (e.g., wheelchair or bedside chair)?: None Help needed to walk in hospital room?: A Little Help needed climbing 3-5 steps with a  railing? : A Lot 6 Click Score: 18    End of Session Equipment Utilized During Treatment: Gait belt Activity Tolerance: Patient tolerated treatment well Patient left: with call bell/phone within reach;in chair;with chair alarm set Nurse Communication: Other (comment);Mobility status(O2sats during ambulation ) PT Visit Diagnosis: Difficulty in walking, not elsewhere classified (R26.2);Unsteadiness on feet (R26.81)    Time: 2836-6294 PT Time Calculation (min) (ACUTE ONLY): 25 min   Charges:   PT Evaluation $PT Eval Low Complexity: 1 Low PT Treatments $Gait Training: 8-22 mins        Julien Girt, PT Acute Rehabilitation Services Pager [phone]  Office [place] 12/25/2018, 4:58 PM

## 2018-12-26 ENCOUNTER — Telehealth: Payer: Self-pay | Admitting: *Deleted

## 2018-12-26 NOTE — Telephone Encounter (Signed)
Transition Care Management Follow-up Telephone Call   Date discharged? 2.6.20   How have you been since you were released from the hospital? "I am okay"   Do you understand why you were in the hospital? yes   Do you understand the discharge instructions? yes   Where were you discharged to? [facility]   Items Reviewed:  Medications reviewed: pt states she can't remember them all and does not have list on hand  Allergies reviewed: yes  Dietary changes reviewed: yes  Referrals reviewed: yes   Functional Questionnaire:   Activities of Daily Living (ADLs):   She states they are independent in the following: ambulation, bathing and hygiene, feeding, continence, grooming, toileting and dressing States they require assistance with the following: na   Any transportation issues/concerns?: no, will need to talk to her driver at ILF   Any patient concerns? no   Confirmed importance and date/time of follow-up visits scheduled yes  Provider Appointment booked with PCP 12/30/18 @120   Confirmed with patient if condition begins to worsen call PCP or go to the ER.  Patient was given the office number and encouraged to call back with question or concerns.  : yes

## 2018-12-30 ENCOUNTER — Encounter: Payer: Self-pay | Admitting: Medical

## 2018-12-30 ENCOUNTER — Ambulatory Visit (INDEPENDENT_AMBULATORY_CARE_PROVIDER_SITE_OTHER): Payer: Medicare HMO | Admitting: Medical

## 2018-12-30 VITALS — BP 134/72 | HR 86 | Temp 98.3°F | Resp 16 | Ht <= 58 in | Wt 98.4 lb

## 2018-12-30 DIAGNOSIS — I10 Essential (primary) hypertension: Secondary | ICD-10-CM | POA: Diagnosis not present

## 2018-12-30 DIAGNOSIS — E871 Hypo-osmolality and hyponatremia: Secondary | ICD-10-CM | POA: Diagnosis not present

## 2018-12-30 DIAGNOSIS — D649 Anemia, unspecified: Secondary | ICD-10-CM

## 2018-12-30 DIAGNOSIS — J449 Chronic obstructive pulmonary disease, unspecified: Secondary | ICD-10-CM

## 2018-12-30 DIAGNOSIS — I509 Heart failure, unspecified: Secondary | ICD-10-CM

## 2018-12-30 LAB — CBC WITH DIFFERENTIAL/PLATELET
Basophils Absolute: 0.1 10*3/uL (ref 0.0–0.1)
Basophils Relative: 0.7 % (ref 0.0–3.0)
Eosinophils Absolute: 0.1 10*3/uL (ref 0.0–0.7)
Eosinophils Relative: 1.7 % (ref 0.0–5.0)
HCT: 35.5 % — ABNORMAL LOW (ref 36.0–46.0)
Hemoglobin: 12 g/dL (ref 12.0–15.0)
LYMPHS PCT: 15.5 % (ref 12.0–46.0)
Lymphs Abs: 1.1 10*3/uL (ref 0.7–4.0)
MCHC: 33.7 g/dL (ref 30.0–36.0)
MCV: 86.8 fl (ref 78.0–100.0)
Monocytes Absolute: 1.2 10*3/uL — ABNORMAL HIGH (ref 0.1–1.0)
Monocytes Relative: 16.6 % — ABNORMAL HIGH (ref 3.0–12.0)
Neutro Abs: 4.6 10*3/uL (ref 1.4–7.7)
Neutrophils Relative %: 65.5 % (ref 43.0–77.0)
Platelets: 269 10*3/uL (ref 150.0–400.0)
RBC: 4.09 Mil/uL (ref 3.87–5.11)
RDW: 14.8 % (ref 11.5–15.5)
WBC: 7 10*3/uL (ref 4.0–10.5)

## 2018-12-30 LAB — COMPREHENSIVE METABOLIC PANEL
ALT: 44 U/L — ABNORMAL HIGH (ref 0–35)
AST: 31 U/L (ref 0–37)
Albumin: 3.8 g/dL (ref 3.5–5.2)
Alkaline Phosphatase: 50 U/L (ref 39–117)
BUN: 15 mg/dL (ref 6–23)
CO2: 27 mEq/L (ref 19–32)
Calcium: 9.1 mg/dL (ref 8.4–10.5)
Chloride: 97 mEq/L (ref 96–112)
Creatinine, Ser: 0.47 mg/dL (ref 0.40–1.20)
GFR: 123.21 mL/min (ref >60.00)
Glucose, Bld: 97 mg/dL (ref 70–99)
Potassium: 4.3 mEq/L (ref 3.5–5.1)
Sodium: 132 mEq/L — ABNORMAL LOW (ref 135–145)
Total Bilirubin: 0.6 mg/dL (ref 0.2–1.2)
Total Protein: 5.9 g/dL — ABNORMAL LOW (ref 6.0–8.3)

## 2018-12-30 LAB — BRAIN NATRIURETIC PEPTIDE: Pro B Natriuretic peptide (BNP): 147 pg/mL — ABNORMAL HIGH (ref 0.0–100.0)

## 2018-12-30 NOTE — Progress Notes (Addendum)
Subjective:    Patient ID: Tracey Holt, female    DOB: [birth year], 85 y.o.   MRN: [id number]  HPI  Pt in for follow up from hospital. She went to ED for some  Fatigue and HA.  She eats 3 meals a day.  Breakfast same every day. Describes healthy breakfast. Lunch and dinner eats less. Pt does admit sometime overhydrating withplain  water.  Pt had went to ED due to ha and was worked up. Found to have   Assessment and Plan:   1.  Symptomatic hyponatremia: Secondary to SIADH versus dehydration.  Urine specific gravity appears to be low.  Could be pneumonia or medication induced SIADH.  Will check urine electrolytes, osmolality as well as uric acid (less than 1.5 would indicate SIADH).  Check TSH and cortisol levels.  Will start on mild IV hydration until above lab results available, as patient may also have a mixed picture given recent decrease in oral intake and dry appearance clinically.  Serial BMP every 8 hours.  2.  Headache: Likely secondary to problem #1.  CT head negative  3.  Hypertension: Hold Norvasc given soft blood pressure with systolic in 124P  4.  Hyperlipidemia: Resume home medications  5.  Cough: Repeat chest x-ray.  If noted to have abnormality might need CT chest to rule out underlying lung malignancy given duration of symptoms greater than 3 months.  6.  Cardiomyopathy/bradycardic cardiac arrest status post pacemaker: No acute issues.  Follow-up as outpatient.  Not on any diuretics and does not appear to be volume overloaded.  Last EF was 45 to   She discharge and now here for follow up. Admit date was 12/23/2018. DC date was 12/25/2018.   Review of Systems  Constitutional: Negative for chills, fatigue and fever.  Respiratory: Negative for cough, chest tightness, shortness of breath and wheezing.   Cardiovascular: Negative for chest pain and palpitations.  Gastrointestinal: Negative for abdominal pain, nausea and vomiting.  Musculoskeletal: Negative for back  pain.  Skin: Negative for rash.  Neurological: Negative for dizziness, seizures, syncope, weakness and headaches.  Hematological: Negative for adenopathy. Does not bruise/bleed easily.  Psychiatric/Behavioral: Negative for behavioral problems and confusion.    Past Medical History:  Diagnosis Date  . Bradycardic cardiac arrest ([place]) 07/24/2016   Required CPR, intubation with multiple rib fractures. Had short term cardiac shock with acute heart failure.  Resulted in possible stress-induced cardiomyopathy  . Cardiac related syncope 07/24/2016   Bradycardic arrest requiring CPR 2 with transvenous pacemaker placed followed by permanent pacemaker; complicated by respiratory arrest, type II MI, stress-induced cardiopathy  . Cardiomyopathy ([place]) 07/2016   Moderate severely reduced EF with apical hypokinesis suggestive of stress-induced cardiac myopathy versus multivessel CAD --> in setting of her to cardiac arrest  . COPD (chronic obstructive pulmonary disease) ([place])    By report  . History of chronic constipation   . Hypercholesterolemia   . Osteoporosis   . Pacemaker 07/26/2016   Abbott dual-chamber pacemaker: [employer] #[id number] - Serial [id number]  . Pneumonia 08/2016  . Sarcoidosis of lung ([place])   . Spinal stenosis      Social History   Socioeconomic History  . Marital status: Widowed    Spouse name: Not on file  . Number of children: Not on file  . Years of education: Not on file  . Highest education level: Not on file  Occupational History  . Not on file  Social Needs  . Financial resource strain: Not  on file  . Food insecurity:    Worry: Not on file    Inability: Not on file  . Transportation needs:    Medical: Not on file    Non-medical: Not on file  Tobacco Use  . Smoking status: Former Smoker    Packs/day: 1.50    Years: 17.00    Pack years: 25.50    Types: Cigarettes    Start date: 12/18/1963    Last attempt to quit: 11/20/1983    Years since quitting:  35.1  . Smokeless tobacco: Never Used  Substance and Sexual Activity  . Alcohol use: No  . Drug use: No  . Sexual activity: Not on file  Lifestyle  . Physical activity:    Days per week: Not on file    Minutes per session: Not on file  . Stress: Not on file  Relationships  . Social connections:    Talks on phone: Not on file    Gets together: Not on file    Attends religious service: Not on file    Active member of club or organization: Not on file    Attends meetings of clubs or organizations: Not on file    Relationship status: Not on file  . Intimate partner violence:    Fear of current or ex partner: Not on file    Emotionally abused: Not on file    Physically abused: Not on file    Forced sexual activity: Not on file  Other Topics Concern  . Not on file  Social History Narrative   Recently moved to [place] from [place] to live near her daughter.    Past Surgical History:  Procedure Laterality Date  . ADENOIDECTOMY    . PACEMAKER INSERTION Left 07/26/2016   Abbott [employer] Model #[id number] - Serial #[id number] -- complicated by pneumothorax requiring chest tube placement  . TEE WITHOUT CARDIOVERSION  02/2013   EF 55-60% with normal global function. No thrombus, mass or vegetation. Trivial-small pericardial effusion. Moderate LA dilation. Mild MR.  . TONSILLECTOMY    . TRANSTHORACIC ECHOCARDIOGRAM  07/24/2016   [facility]: EF 45-50%. Mid and apical inferior, inferolateral and apical hypokinesis. GR 2 DD. Mild LA and moderate RA dilation. Mild paracardial effusion. Moderate to severe TR. Basal and mid segment hypokinesis with apical hypokinesis. - Suspect stress-induced cardiopathy versus multivessel CAD.  Marland Kitchen TRANSTHORACIC ECHOCARDIOGRAM  07/26/2016   [facility]: LIMITED (post PPM) - although the report suggested EF 30-35% with apical akinesis, rounding note suggested this was an improvement    Family History  Problem  Relation Age of Onset  . Allergic rhinitis Neg Hx   . Angioedema Neg Hx   . Asthma Neg Hx   . Eczema Neg Hx   . Immunodeficiency Neg Hx   . Urticaria Neg Hx     Allergies  Allergen Reactions  . Dust Mite Extract Shortness Of Breath  . Levaquin [Levofloxacin In D5w] Shortness Of Breath  . Lidex [Fluocinonide] Swelling    Caused lip swelling  . Molds & Smuts Shortness Of Breath  . Rifampin Shortness Of Breath  . Lactose Other (See Comments)    unknown  . Penicillins Swelling    Did it involve swelling of the face/tongue/throat, SOB, or low BP? No Did it involve sudden or severe rash/hives, skin peeling, or any reaction on the inside of your mouth or nose? Unknown Did you need to seek medical attention at a hospital or doctor's office? Unknown When  did it last happen?unk If all above answers are "NO", may proceed with cephalosporin use.     Current Outpatient Medications on File Prior to Visit  Medication Sig Dispense Refill  . albuterol (PROVENTIL HFA;VENTOLIN HFA) 108 (90 Base) MCG/ACT inhaler Inhale 2 puffs into the lungs every 6 (six) hours as needed for wheezing or shortness of breath. 1 Inhaler 2  . amLODipine (NORVASC) 2.5 MG tablet TAKE 1 TABLET BY MOUTH EVERY DAY (Patient taking differently: Take 2.5 mg by mouth daily. ) 90 tablet 1  . Ascorbic Acid (VITAMIN C) 1000 MG tablet Take 1,000 mg by mouth daily.    Marland Kitchen atorvastatin (LIPITOR) 10 MG tablet Take 1/2 tablet (5mg ) by mouth daily (Patient taking differently: Take 5 mg by mouth daily at 6 PM. ) 45 tablet 3  . benzonatate (TESSALON) 100 MG capsule Take 1 capsule (100 mg total) by mouth 3 (three) times daily as needed for cough. 30 capsule 0  . Biotin 10000 MCG TABS Take 10,000 mcg by mouth daily.     Marland Kitchen docusate sodium (COLACE) 100 MG capsule Take 100 mg by mouth daily as needed for mild constipation.     Marland Kitchen guaiFENesin-dextromethorphan (ROBITUSSIN DM) 100-10 MG/5ML syrup Take 5 mLs by mouth every 4 (four) hours as  needed (chest congestion). 118 mL 0  . hydroxypropyl methylcellulose / hypromellose (ISOPTO TEARS / GONIOVISC) 2.5 % ophthalmic solution Place 1 drop into both eyes 3 (three) times daily.     Marland Kitchen ipratropium (ATROVENT HFA) 17 MCG/ACT inhaler Inhale 2 puffs into the lungs every 6 (six) hours as needed for wheezing. 1 Inhaler 2  . levocetirizine (XYZAL) 5 MG tablet Take 5 mg by mouth every evening.    . Multiple Vitamins-Minerals (MULTIVITAMIN WITH MINERALS) tablet Take 1 tablet by mouth daily.    . mupirocin cream (BACTROBAN) 2 % Apply 1 application topically 2 (two) times daily. 30 g 0  . pantoprazole (PROTONIX) 40 MG tablet Take 1 tablet (40 mg total) by mouth daily. 30 tablet 1   No current facility-administered medications on file prior to visit.     BP 134/72   Pulse 86   Temp 98.3 F (36.8 C) (Oral)   Resp 16   Ht 4\' 9"  (1.448 m)   Wt 98 lb 6.4 oz (44.6 kg)   SpO2 97%   BMI 21.29 kg/m      Objective:   Physical Exam  General Mental Status- Alert. General Appearance- Not in acute distress. Pt did not cough at all during the exam.  Skin General: Color- Normal Color. Moisture- Normal Moisture.  Neck Carotid Arteries- Normal color. Moisture- Normal Moisture. No carotid bruits. No JVD.  Chest and Lung Exam Auscultation: Breath Sounds:-Normal.  Cardiovascular Auscultation:Rythm- Regular. Murmurs & Other Heart Sounds:Auscultation of the heart reveals- No Murmurs.  Abdomen Inspection:-Inspeection Normal. Palpation/Percussion:Note:No mass. Palpation and Percussion of the abdomen reveal- Non Tender, Non Distended + BS, no rebound or guarding.    Neurologic Cranial Nerve exam:- CN III-XII intact(No nystagmus), symmetric smile. Strength:- 5/5 equal and symmetric strength both upper and lower extremities.  Lower ext- no pedal edema. Negative homans signs.     Assessment & Plan:  You do look well presently.    You had recent hospitalization for low sodium. We need to  repeat cmp lab today and confirm stable sodium. I want you to hydrate with propel 3 bottle of day and eat 2 cans of soup a day. Then repeat cmp again on Monday as well to  make sure sodium level are maintained as you have had recent admission for low sodium.  Hx of anemia so will get cbc.  For htn continue with current bp medications amlodipine.  If your cough returns will refer you to pulmonologist since known hx of copd.  Known hx of chf(chronic systolic heart failure) found on probem list. In light of advisement to increase hydration good idea to get xray as recommended below.  Follow up Monday for lab and chest xray  Mackie Pai, PA-C

## 2018-12-30 NOTE — Patient Instructions (Addendum)
You do look well presently.    You had recent hospitalization for low sodium. We need to repeat cmp lab today and confirm stable sodium. I want you to hydrate with propel 3 bottle of day and eat 2 cans of soup a day. Then repeat cmp again on Monday as well to make sure sodium level are maintained as you have had recent admission for low sodium.  Hx of anemia so will get cbc.  For htn continue with current bp medications amlodipine.  If your cough returns will refer you to pulmonologist since known hx of copd.  Known hx of chf(chronic ssytolic heart failure) found on probem list. In light of advisement to increase hydration good idea to get xray as recommended below.  Follow up Monday for lab and chest xray.

## 2019-01-02 ENCOUNTER — Ambulatory Visit: Payer: Self-pay

## 2019-01-02 ENCOUNTER — Ambulatory Visit: Payer: Self-pay | Admitting: *Deleted

## 2019-01-02 ENCOUNTER — Observation Stay (HOSPITAL_COMMUNITY)
Admission: EM | Admit: 2019-01-02 | Discharge: 2019-01-05 | Disposition: A | Discharge status: Home / Self Care | Payer: Medicare HMO | Attending: Internal Medicine | Admitting: Internal Medicine

## 2019-01-02 ENCOUNTER — Emergency Department (HOSPITAL_COMMUNITY): Payer: Medicare HMO

## 2019-01-02 ENCOUNTER — Encounter (HOSPITAL_COMMUNITY): Payer: Self-pay

## 2019-01-02 ENCOUNTER — Observation Stay (HOSPITAL_COMMUNITY): Payer: Medicare HMO

## 2019-01-02 ENCOUNTER — Other Ambulatory Visit: Payer: Self-pay

## 2019-01-02 DIAGNOSIS — Z87891 Personal history of nicotine dependence: Secondary | ICD-10-CM | POA: Diagnosis not present

## 2019-01-02 DIAGNOSIS — Z888 Allergy status to other drugs, medicaments and biological substances status: Secondary | ICD-10-CM | POA: Insufficient documentation

## 2019-01-02 DIAGNOSIS — J40 Bronchitis, not specified as acute or chronic: Secondary | ICD-10-CM

## 2019-01-02 DIAGNOSIS — J9601 Acute respiratory failure with hypoxia: Secondary | ICD-10-CM | POA: Diagnosis not present

## 2019-01-02 DIAGNOSIS — I5032 Chronic diastolic (congestive) heart failure: Secondary | ICD-10-CM | POA: Insufficient documentation

## 2019-01-02 DIAGNOSIS — I442 Atrioventricular block, complete: Secondary | ICD-10-CM

## 2019-01-02 DIAGNOSIS — R001 Bradycardia, unspecified: Secondary | ICD-10-CM | POA: Diagnosis not present

## 2019-01-02 DIAGNOSIS — E876 Hypokalemia: Secondary | ICD-10-CM

## 2019-01-02 DIAGNOSIS — J440 Chronic obstructive pulmonary disease with acute lower respiratory infection: Secondary | ICD-10-CM | POA: Insufficient documentation

## 2019-01-02 DIAGNOSIS — K5909 Other constipation: Secondary | ICD-10-CM | POA: Diagnosis not present

## 2019-01-02 DIAGNOSIS — I469 Cardiac arrest, cause unspecified: Secondary | ICD-10-CM | POA: Diagnosis not present

## 2019-01-02 DIAGNOSIS — R531 Weakness: Secondary | ICD-10-CM | POA: Diagnosis not present

## 2019-01-02 DIAGNOSIS — N390 Urinary tract infection, site not specified: Secondary | ICD-10-CM | POA: Diagnosis present

## 2019-01-02 DIAGNOSIS — D860 Sarcoidosis of lung: Secondary | ICD-10-CM | POA: Diagnosis not present

## 2019-01-02 DIAGNOSIS — I429 Cardiomyopathy, unspecified: Secondary | ICD-10-CM | POA: Diagnosis not present

## 2019-01-02 DIAGNOSIS — R0902 Hypoxemia: Secondary | ICD-10-CM | POA: Diagnosis not present

## 2019-01-02 DIAGNOSIS — E871 Hypo-osmolality and hyponatremia: Secondary | ICD-10-CM | POA: Diagnosis not present

## 2019-01-02 DIAGNOSIS — R0600 Dyspnea, unspecified: Secondary | ICD-10-CM | POA: Diagnosis present

## 2019-01-02 DIAGNOSIS — Z950 Presence of cardiac pacemaker: Secondary | ICD-10-CM | POA: Diagnosis not present

## 2019-01-02 DIAGNOSIS — Z79899 Other long term (current) drug therapy: Secondary | ICD-10-CM | POA: Insufficient documentation

## 2019-01-02 DIAGNOSIS — R140 Abdominal distension (gaseous): Secondary | ICD-10-CM | POA: Diagnosis not present

## 2019-01-02 DIAGNOSIS — Z66 Do not resuscitate: Secondary | ICD-10-CM | POA: Diagnosis not present

## 2019-01-02 DIAGNOSIS — R058 Other specified cough: Secondary | ICD-10-CM | POA: Diagnosis present

## 2019-01-02 DIAGNOSIS — Z881 Allergy status to other antibiotic agents status: Secondary | ICD-10-CM | POA: Insufficient documentation

## 2019-01-02 DIAGNOSIS — I110 Hypertensive heart disease with heart failure: Secondary | ICD-10-CM | POA: Insufficient documentation

## 2019-01-02 DIAGNOSIS — I358 Other nonrheumatic aortic valve disorders: Secondary | ICD-10-CM | POA: Insufficient documentation

## 2019-01-02 DIAGNOSIS — J209 Acute bronchitis, unspecified: Secondary | ICD-10-CM | POA: Insufficient documentation

## 2019-01-02 DIAGNOSIS — K5900 Constipation, unspecified: Secondary | ICD-10-CM | POA: Diagnosis not present

## 2019-01-02 DIAGNOSIS — Z880 Allergy status to penicillin: Secondary | ICD-10-CM | POA: Insufficient documentation

## 2019-01-02 DIAGNOSIS — R0602 Shortness of breath: Secondary | ICD-10-CM | POA: Diagnosis not present

## 2019-01-02 DIAGNOSIS — R2681 Unsteadiness on feet: Secondary | ICD-10-CM | POA: Insufficient documentation

## 2019-01-02 DIAGNOSIS — I351 Nonrheumatic aortic (valve) insufficiency: Secondary | ICD-10-CM | POA: Diagnosis not present

## 2019-01-02 DIAGNOSIS — R0609 Other forms of dyspnea: Secondary | ICD-10-CM

## 2019-01-02 DIAGNOSIS — R05 Cough: Secondary | ICD-10-CM

## 2019-01-02 DIAGNOSIS — B9620 Unspecified Escherichia coli [E. coli] as the cause of diseases classified elsewhere: Secondary | ICD-10-CM | POA: Insufficient documentation

## 2019-01-02 LAB — MAGNESIUM: Magnesium: 1.8 mg/dL (ref 1.7–2.4)

## 2019-01-02 LAB — CBC WITH DIFFERENTIAL/PLATELET
Abs Immature Granulocytes: 0.05 10*3/uL (ref 0.00–0.07)
Basophils Absolute: 0 10*3/uL (ref 0.0–0.1)
Basophils Relative: 0 %
EOS ABS: 0 10*3/uL (ref 0.0–0.5)
Eosinophils Relative: 0 %
HCT: 36.8 % (ref 36.0–46.0)
Hemoglobin: 11.7 g/dL — ABNORMAL LOW (ref 12.0–15.0)
Immature Granulocytes: 1 %
Lymphocytes Relative: 8 %
Lymphs Abs: 0.8 10*3/uL (ref 0.7–4.0)
MCH: 29.3 pg (ref 26.0–34.0)
MCHC: 31.8 g/dL (ref 30.0–36.0)
MCV: 92 fL (ref 80.0–100.0)
Monocytes Absolute: 1.3 10*3/uL — ABNORMAL HIGH (ref 0.1–1.0)
Monocytes Relative: 13 %
Neutro Abs: 8 10*3/uL — ABNORMAL HIGH (ref 1.7–7.7)
Neutrophils Relative %: 78 %
Platelets: 203 10*3/uL (ref 150–400)
RBC: 4 MIL/uL (ref 3.87–5.11)
RDW: 14.4 % (ref 11.5–15.5)
WBC: 10.2 10*3/uL (ref 4.0–10.5)
nRBC: 0 % (ref 0.0–0.2)

## 2019-01-02 LAB — URINALYSIS, ROUTINE W REFLEX MICROSCOPIC
Bilirubin Urine: NEGATIVE
Glucose, UA: NEGATIVE mg/dL
Ketones, ur: NEGATIVE mg/dL
Nitrite: NEGATIVE
Protein, ur: NEGATIVE mg/dL
Specific Gravity, Urine: 1.005 (ref 1.005–1.030)
WBC, UA: >50 WBC/hpf — ABNORMAL HIGH (ref 0–5)
pH: 6 (ref 5.0–8.0)

## 2019-01-02 LAB — INFLUENZA PANEL BY PCR (TYPE A & B)
Influenza A By PCR: NEGATIVE
Influenza B By PCR: NEGATIVE

## 2019-01-02 LAB — BASIC METABOLIC PANEL
Anion gap: 10 (ref 5–15)
BUN: 12 mg/dL (ref 8–23)
CO2: 27 mmol/L (ref 22–32)
Calcium: 8.1 mg/dL — ABNORMAL LOW (ref 8.9–10.3)
Chloride: 97 mmol/L — ABNORMAL LOW (ref 98–111)
Creatinine, Ser: 0.41 mg/dL — ABNORMAL LOW (ref 0.44–1.00)
GFR calc Af Amer: >60 mL/min (ref >60)
GFR calc non Af Amer: >60 mL/min (ref >60)
Glucose, Bld: 99 mg/dL (ref 70–99)
POTASSIUM: 3.1 mmol/L — AB (ref 3.5–5.1)
Sodium: 134 mmol/L — ABNORMAL LOW (ref 135–145)

## 2019-01-02 LAB — TROPONIN I

## 2019-01-02 LAB — BRAIN NATRIURETIC PEPTIDE: B Natriuretic Peptide: 315.2 pg/mL — ABNORMAL HIGH (ref 0.0–100.0)

## 2019-01-02 MED ORDER — PANTOPRAZOLE SODIUM 40 MG PO TBEC
40.0000 mg | DELAYED_RELEASE_TABLET | Freq: Every day | ORAL | Status: DC | PRN
  Administered 2019-01-03 – 2019-01-04 (×2): 40 mg via ORAL
  Filled 2019-01-02 (×2): qty 1

## 2019-01-02 MED ORDER — ENOXAPARIN SODIUM 30 MG/0.3ML ~~LOC~~ SOLN
30.0000 mg | SUBCUTANEOUS | Status: DC
  Administered 2019-01-02 – 2019-01-03 (×2): 30 mg via SUBCUTANEOUS
  Filled 2019-01-02 (×2): qty 0.3

## 2019-01-02 MED ORDER — ATORVASTATIN CALCIUM 10 MG PO TABS
5.0000 mg | ORAL_TABLET | Freq: Every day | ORAL | Status: DC
  Administered 2019-01-03 – 2019-01-05 (×3): 5 mg via ORAL
  Filled 2019-01-02 (×3): qty 1

## 2019-01-02 MED ORDER — POTASSIUM CHLORIDE 10 MEQ/100ML IV SOLN
10.0000 meq | INTRAVENOUS | Status: AC
  Administered 2019-01-02 (×2): 10 meq via INTRAVENOUS
  Filled 2019-01-02 (×2): qty 100

## 2019-01-02 MED ORDER — ONDANSETRON HCL 4 MG PO TABS: 4.0000 mg | ORAL_TABLET | Freq: Four times a day (QID) | ORAL | Status: DC | PRN

## 2019-01-02 MED ORDER — IPRATROPIUM BROMIDE 0.02 % IN SOLN
0.5000 mg | Freq: Three times a day (TID) | RESPIRATORY_TRACT | Status: DC
  Administered 2019-01-03 – 2019-01-05 (×7): 0.5 mg via RESPIRATORY_TRACT
  Filled 2019-01-02 (×7): qty 2.5

## 2019-01-02 MED ORDER — IOPAMIDOL (ISOVUE-370) INJECTION 76%
100.0000 mL | Freq: Once | INTRAVENOUS | Status: AC | PRN
  Administered 2019-01-02: 100 mL via INTRAVENOUS

## 2019-01-02 MED ORDER — SODIUM CHLORIDE 0.9% FLUSH
3.0000 mL | Freq: Two times a day (BID) | INTRAVENOUS | Status: DC
  Administered 2019-01-02 – 2019-01-04 (×5): 3 mL via INTRAVENOUS

## 2019-01-02 MED ORDER — ACETAMINOPHEN 325 MG PO TABS: 650.0000 mg | ORAL_TABLET | Freq: Four times a day (QID) | ORAL | Status: DC | PRN

## 2019-01-02 MED ORDER — GUAIFENESIN ER 600 MG PO TB12
600.0000 mg | ORAL_TABLET | Freq: Two times a day (BID) | ORAL | Status: DC
  Administered 2019-01-02 – 2019-01-05 (×6): 600 mg via ORAL
  Filled 2019-01-02 (×6): qty 1

## 2019-01-02 MED ORDER — IPRATROPIUM BROMIDE 0.02 % IN SOLN
0.5000 mg | Freq: Four times a day (QID) | RESPIRATORY_TRACT | Status: DC
  Administered 2019-01-02: 0.5 mg via RESPIRATORY_TRACT
  Filled 2019-01-02: qty 2.5

## 2019-01-02 MED ORDER — POTASSIUM CHLORIDE CRYS ER 20 MEQ PO TBCR
40.0000 meq | EXTENDED_RELEASE_TABLET | Freq: Once | ORAL | Status: AC
  Administered 2019-01-02: 40 meq via ORAL
  Filled 2019-01-02: qty 2

## 2019-01-02 MED ORDER — SODIUM CHLORIDE 0.9% FLUSH
3.0000 mL | INTRAVENOUS | Status: DC | PRN
  Administered 2019-01-02: 3 mL via INTRAVENOUS
  Filled 2019-01-02: qty 3

## 2019-01-02 MED ORDER — SENNA 8.6 MG PO TABS
1.0000 | ORAL_TABLET | Freq: Two times a day (BID) | ORAL | Status: DC
  Administered 2019-01-02 – 2019-01-05 (×6): 8.6 mg via ORAL
  Filled 2019-01-02 (×6): qty 1

## 2019-01-02 MED ORDER — SODIUM CHLORIDE 0.9 % IV SOLN
250.0000 mL | INTRAVENOUS | Status: DC | PRN
  Administered 2019-01-02: 250 mL via INTRAVENOUS

## 2019-01-02 MED ORDER — FUROSEMIDE 10 MG/ML IJ SOLN
20.0000 mg | Freq: Once | INTRAMUSCULAR | Status: AC
  Administered 2019-01-02: 20 mg via INTRAVENOUS
  Filled 2019-01-02: qty 4

## 2019-01-02 MED ORDER — IPRATROPIUM-ALBUTEROL 0.5-2.5 (3) MG/3ML IN SOLN
3.0000 mL | Freq: Once | RESPIRATORY_TRACT | Status: AC
  Administered 2019-01-02: 3 mL via RESPIRATORY_TRACT
  Filled 2019-01-02: qty 3

## 2019-01-02 MED ORDER — ONDANSETRON HCL 4 MG/2ML IJ SOLN: 4.0000 mg | Freq: Four times a day (QID) | INTRAMUSCULAR | Status: DC | PRN

## 2019-01-02 MED ORDER — BISACODYL 10 MG RE SUPP
10.0000 mg | Freq: Every day | RECTAL | Status: DC | PRN
  Administered 2019-01-03: 10 mg via RECTAL
  Filled 2019-01-02: qty 1

## 2019-01-02 MED ORDER — LEVALBUTEROL HCL 0.63 MG/3ML IN NEBU
0.6300 mg | INHALATION_SOLUTION | Freq: Four times a day (QID) | RESPIRATORY_TRACT | Status: DC
  Administered 2019-01-02: 0.63 mg via RESPIRATORY_TRACT
  Filled 2019-01-02: qty 3

## 2019-01-02 MED ORDER — ALBUTEROL SULFATE (2.5 MG/3ML) 0.083% IN NEBU: 2.5000 mg | INHALATION_SOLUTION | RESPIRATORY_TRACT | Status: DC | PRN

## 2019-01-02 MED ORDER — HYDROCODONE-ACETAMINOPHEN 5-325 MG PO TABS
1.0000 | ORAL_TABLET | ORAL | Status: DC | PRN
  Administered 2019-01-05: 1 via ORAL
  Filled 2019-01-02: qty 1

## 2019-01-02 MED ORDER — SODIUM CHLORIDE 0.9 % IV SOLN
1.0000 g | INTRAVENOUS | Status: DC
  Administered 2019-01-03 – 2019-01-04 (×3): 1 g via INTRAVENOUS
  Filled 2019-01-02: qty 1
  Filled 2019-01-02 (×3): qty 10

## 2019-01-02 MED ORDER — ACETAMINOPHEN 650 MG RE SUPP: 650.0000 mg | Freq: Four times a day (QID) | RECTAL | Status: DC | PRN

## 2019-01-02 MED ORDER — AMLODIPINE BESYLATE 5 MG PO TABS
2.5000 mg | ORAL_TABLET | Freq: Every day | ORAL | Status: DC
  Administered 2019-01-02 – 2019-01-05 (×4): 2.5 mg via ORAL
  Filled 2019-01-02 (×4): qty 1

## 2019-01-02 MED ORDER — BENZONATATE 100 MG PO CAPS
100.0000 mg | ORAL_CAPSULE | Freq: Once | ORAL | Status: AC
  Administered 2019-01-02: 100 mg via ORAL
  Filled 2019-01-02: qty 1

## 2019-01-02 MED ORDER — LEVALBUTEROL HCL 0.63 MG/3ML IN NEBU
0.6300 mg | INHALATION_SOLUTION | Freq: Three times a day (TID) | RESPIRATORY_TRACT | Status: DC
  Administered 2019-01-03 – 2019-01-05 (×7): 0.63 mg via RESPIRATORY_TRACT
  Filled 2019-01-02 (×7): qty 3

## 2019-01-02 NOTE — Telephone Encounter (Signed)
Patient reports she has a lack of energy and slight headache. Patient reports her lack of energy is worse since she has come home from hospital. Patient no improvement since she has been home.  Patient is drinking- 4 bottles of propel yesterday. Patient just started on one today- Patient has eaten a little soup 1 hour ago.  Patient does not have anyone to call where she lives. Patient reports she is having trouble with her breathing since she has been sick. Call to office-they request note- patient was confused and SOB when she called- she has gotten calmer- but do not feel she needs to be by herself- have asked if I can call 911 for her- but she wants to speak to PCP Per PCP patient needs to go back to ED- 911 called for patient and conferenced in with her.  Reason for Disposition . [1] SEVERE weakness (i.e., unable to walk or barely able to walk, requires support) AND     [2] new onset or worsening  Answer Assessment - Initial Assessment Questions 1. DESCRIPTION: "Describe how you are feeling."     Patient is weak and is having a hard time fixing /preparing her own food- has to sit 2. SEVERITY: "How bad is it?"  "Can you stand and walk?"   - MILD - Feels weak or tired, but does not interfere with work, school or normal activities   - [place] to stand and walk; weakness interferes with work, school, or normal activities   - SEVERE - Unable to stand or walk     Moderate 3. ONSET:  "When did the weakness begin?"     Continued since in the hospital 4. CAUSE: "What do you think is causing the weakness?"     Low sodium 5. MEDICINES: "Have you recently started a new medicine or had a change in the amount of a medicine?"     No change in medications reported 6. OTHER SYMPTOMS: "Do you have any other symptoms?" (e.g., chest pain, fever, cough, SOB, vomiting, diarrhea, bleeding, other areas of pain)     SOB- related to COPD, headache 7. PREGNANCY: "Is there any chance you are pregnant?" "When  was your last menstrual period?"     n/a  Protocols used: WEAKNESS (GENERALIZED) AND FATIGUE-A-AH

## 2019-01-02 NOTE — ED Provider Notes (Signed)
Medical screening examination/treatment/procedure(s) were conducted as a shared visit with non-physician practitioner(s) and myself.  I personally evaluated the patient during the encounter.  EKG Interpretation  Date/Time:  Friday January 02 2019 14:57:05 EST Ventricular Rate:  72 PR Interval:    QRS Duration: 139 QT Interval:  428 QTC Calculation: 469 R Axis:   -81 Text Interpretation:  Atrial-sensed ventricular-paced rhythm No further analysis attempted due to paced rhythm agree. no change from previous Confirmed by Charlesetta Shanks [phone]) on 01/02/2019 6:10:18 PM Patient with increasing shortness of breath particularly on exertion and cough presents from [facility] facility.  She is getting some burning sensation in the chest.  No documented fever.  Patient is alert and nontoxic.  She has mild increased work of breathing.  Audible wheeze with breathing.  With deep inspiration paroxysmal somewhat wet sounding cough.  Expiratory wheeze coarse bilaterally.  Slight crackles.  Heart regular.  2\6 systolic ejection murmur.  S3 Gallop. JVD abdomen soft and nondistended.  Lower extremities without tenderness or edema.  Patient has shortness of breath at rest.  With ambulation she has desaturated.  At this time will plan for admission with what appears to be some degree of CHF as well as potential bronchitis with prior history of sarcoidosis.   Charlesetta Shanks, MD 01/02/19 [phone]

## 2019-01-02 NOTE — ED Notes (Signed)
Pure wick has been placed. Pt has been educated. Suction set to 45mmHg 

## 2019-01-02 NOTE — ED Notes (Signed)
ED TO INPATIENT HANDOFF REPORT  Name/Age/Gender Tracey Holt 85 y.o. female  Code Status Code Status History    Date Active Date Inactive Code Status Order ID Comments User Context   12/23/2018 1636 12/25/2018 1922 Partial Code [id number]  Elmarie Shiley, MD ED   12/06/2018 0803 12/07/2018 1657 Full Code [id number]  Guilford Shi, MD ED   08/13/2018 0858 08/15/2018 1552 Full Code [id number]  Louellen Molder, MD Inpatient   04/07/2018 0455 04/08/2018 1705 Full Code [id number]  Etta Quill, DO Inpatient    Questions for Most Recent Historical Code Status (Order [id number])    Question Answer Comment   In the event of cardiac or respiratory ARREST: Initiate Code Blue, Call Rapid Response Yes    In the event of cardiac or respiratory ARREST: Perform CPR Yes    In the event of cardiac or respiratory ARREST: Perform Intubation/Mechanical Ventilation No    In the event of cardiac or respiratory ARREST: Use NIPPV/BiPAp only if indicated Yes    In the event of cardiac or respiratory ARREST: Administer ACLS medications if indicated Yes    In the event of cardiac or respiratory ARREST: Perform Defibrillation or Cardioversion if indicated Yes         Advance Directive Documentation     Most Recent Value  Type of Advance Directive  Healthcare Power of Attorney  Pre-existing out of facility DNR order (yellow form or pink MOST form)  -  "MOST" Form in Place?  -      Home/SNF/Other Nursing Home  Chief Complaint cough; common cold  Level of Care/Admitting Diagnosis ED Disposition    ED Disposition Condition [place]: [facility] [[id number]]  Level of Care: Telemetry [5]  Admit to tele based on following criteria: Other see comments  Comments: dyspnea, chf  Diagnosis: Acute respiratory failure with hypoxia [facility]) [[id number]]  Admitting Physician: Toy Baker [[id number]]  Attending Physician: Toy Baker [[id number]]  PT Class (Do Not Modify):  Observation [104]  PT Acc Code (Do Not Modify): Observation [10022]       Medical History Past Medical History:  Diagnosis Date  . Bradycardic cardiac arrest ([place]) 07/24/2016   Required CPR, intubation with multiple rib fractures. Had short term cardiac shock with acute heart failure.  Resulted in possible stress-induced cardiomyopathy  . Cardiac related syncope 07/24/2016   Bradycardic arrest requiring CPR 2 with transvenous pacemaker placed followed by permanent pacemaker; complicated by respiratory arrest, type II MI, stress-induced cardiopathy  . Cardiomyopathy ([place]) 07/2016   Moderate severely reduced EF with apical hypokinesis suggestive of stress-induced cardiac myopathy versus multivessel CAD --> in setting of her to cardiac arrest  . COPD (chronic obstructive pulmonary disease) ([place])    By report  . History of chronic constipation   . Hypercholesterolemia   . Osteoporosis   . Pacemaker 07/26/2016   Abbott dual-chamber pacemaker: [place] #[id number] - Serial [id number]  . Pneumonia 08/2016  . Sarcoidosis of lung ([place])   . Spinal stenosis     Allergies Allergies  Allergen Reactions  . Dust Mite Extract Shortness Of Breath  . Levaquin [Levofloxacin In D5w] Shortness Of Breath  . Lidex [Fluocinonide] Swelling    Caused lip swelling  . Molds & Smuts Shortness Of Breath  . Rifampin Shortness Of Breath  . Lactose Other (See Comments)    Unknown rxn per pt  . Penicillins Swelling    Did it involve swelling of the face/tongue/throat, SOB, or low  BP? No Did it involve sudden or severe rash/hives, skin peeling, or any reaction on the inside of your mouth or nose? Unknown Did you need to seek medical attention at a hospital or doctor's office? Unknown When did it last happen?Unknown If all above answers are "NO", may proceed with cephalosporin use.     IV Location/Drains/Wounds Patient Lines/Drains/Airways Status   Active Line/Drains/Airways    Name:    Placement date:   Placement time:   Site:   Days:   Peripheral IV 01/02/19 Right;Lateral Wrist   01/02/19    1548    Wrist   less than 1          Labs/Imaging Results for orders placed or performed during the hospital encounter of 01/02/19 (from the past 48 hour(s))  Basic metabolic panel     Status: Abnormal   Collection Time: 01/02/19  3:50 PM  Result Value Ref Range   Sodium 134 (L) 135 - 145 mmol/L   Potassium 3.1 (L) 3.5 - 5.1 mmol/L   Chloride 97 (L) 98 - 111 mmol/L   CO2 27 22 - 32 mmol/L   Glucose, Bld 99 70 - 99 mg/dL   BUN 12 8 - 23 mg/dL   Creatinine, Ser 0.41 (L) 0.44 - 1.00 mg/dL   Calcium 8.1 (L) 8.9 - 10.3 mg/dL   GFR calc non Af Amer >60 >60 mL/min   GFR calc Af Amer >60 >60 mL/min   Anion gap 10 5 - 15    Comment: Performed at [facility], [place] [address] [place], [place] [address]  Brain natriuretic peptide     Status: Abnormal   Collection Time: 01/02/19  3:50 PM  Result Value Ref Range   B Natriuretic Peptide 315.2 (H) 0.0 - 100.0 pg/mL    Comment: Performed at [facility], [place] [address] [place], [place] [address]  CBC with Differential     Status: Abnormal   Collection Time: 01/02/19  3:50 PM  Result Value Ref Range   WBC 10.2 4.0 - 10.5 K/uL   RBC 4.00 3.87 - 5.11 MIL/uL   Hemoglobin 11.7 (L) 12.0 - 15.0 g/dL   HCT 36.8 36.0 - 46.0 %   MCV 92.0 80.0 - 100.0 fL   MCH 29.3 26.0 - 34.0 pg   MCHC 31.8 30.0 - 36.0 g/dL   RDW 14.4 11.5 - 15.5 %   Platelets 203 150 - 400 K/uL   nRBC 0.0 0.0 - 0.2 %   Neutrophils Relative % 78 %   Neutro Abs 8.0 (H) 1.7 - 7.7 K/uL   Lymphocytes Relative 8 %   Lymphs Abs 0.8 0.7 - 4.0 K/uL   Monocytes Relative 13 %   Monocytes Absolute 1.3 (H) 0.1 - 1.0 K/uL   Eosinophils Relative 0 %   Eosinophils Absolute 0.0 0.0 - 0.5 K/uL   Basophils Relative 0 %   Basophils Absolute 0.0 0.0 - 0.1 K/uL   Immature Granulocytes 1 %   Abs Immature Granulocytes 0.05 0.00 - 0.07 K/uL     Comment: Performed at [facility], [place] [address], [place], [place] [address]  Influenza panel by PCR (type A & B)     Status: None   Collection Time: 01/02/19  3:50 PM  Result Value Ref Range   Influenza A By PCR NEGATIVE NEGATIVE   Influenza B By PCR NEGATIVE NEGATIVE    Comment: (NOTE) The Xpert Xpress Flu assay is intended as an aid in  the diagnosis of  influenza and should not be used as a sole basis for treatment.  This  assay is FDA approved for nasopharyngeal swab specimens only. Nasal  washings and aspirates are unacceptable for Xpert Xpress Flu testing. Performed at [facility], [place] [address], [place], [place] [address]    Dg Chest 2 View  Result Date: 01/02/2019 CLINICAL DATA:  Cough and not feeling well. EXAM: CHEST - 2 VIEW COMPARISON:  Chest x-ray and chest CT 12/23/2018 FINDINGS: The heart is enlarged but stable. Stable tortuosity and calcification of the thoracic aorta. The pacer wires are stable. Stable emphysematous changes and pulmonary scarring. Persistent lingular densities. No new/acute findings. No pleural effusions. The bony thorax appears stable. Remote midthoracic compression deformities. IMPRESSION: Chronic emphysematous changes and chronic lingular densities. No pleural effusion or pneumothorax. Electronically Signed   By: Marijo Sanes M.D.   On: 01/02/2019 16:31   EKG Interpretation  Date/Time:  Friday January 02 2019 14:57:05 EST Ventricular Rate:  72 PR Interval:    QRS Duration: 139 QT Interval:  428 QTC Calculation: 469 R Axis:   -81 Text Interpretation:  Atrial-sensed ventricular-paced rhythm No further analysis attempted due to paced rhythm agree. no change from previous Confirmed by Charlesetta Shanks [phone]) on 01/02/2019 6:10:18 PM   Pending Labs Unresulted Labs (From admission, onward)    Start     Ordered   01/02/19 2009  Magnesium  Add-on,   R     01/02/19 2008   01/02/19 1954  Troponin I - Now Then  Q6H  Now then every 6 hours,   R     01/02/19 1953   01/02/19 1948  Urinalysis, Routine w reflex microscopic  Once,   R     01/02/19 1947          Vitals/Pain Today's Vitals   01/02/19 1700 01/02/19 1730 01/02/19 1830 01/02/19 1832  BP: 135/60 (!) 144/83 (!) 147/89 (!) 144/83  Pulse:   81 83  Resp: 20 18  15   Temp:      TempSrc:      SpO2:   99% 98%  Weight:      Height:      PainSc:        Isolation Precautions Droplet precaution  Medications Medications  potassium chloride 10 mEq in 100 mL IVPB (has no administration in time range)  benzonatate (TESSALON) capsule 100 mg (100 mg Oral Given 01/02/19 1601)  ipratropium-albuterol (DUONEB) 0.5-2.5 (3) MG/3ML nebulizer solution 3 mL (3 mLs Nebulization Given 01/02/19 1602)  potassium chloride SA (K-DUR,KLOR-CON) CR tablet 40 mEq (40 mEq Oral Given 01/02/19 1659)  ipratropium-albuterol (DUONEB) 0.5-2.5 (3) MG/3ML nebulizer solution 3 mL (3 mLs Nebulization Given 01/02/19 1833)  furosemide (LASIX) injection 20 mg (20 mg Intravenous Given 01/02/19 1832)    Mobility non-ambulatory

## 2019-01-02 NOTE — ED Provider Notes (Signed)
[place] DEPT Provider Note   CSN: [id number] Arrival date & time: 01/02/19  1432     History   Chief Complaint Chief Complaint  Patient presents with  . Cough    HPI Tracey Holt is a 85 y.o. female.  The history is provided by the patient and medical records. No language interpreter was used.  Cough     85 year old female with hx of CHF, sarcoidosis of lung, intractable headache, chronic rhinitis brought here via EMS from [facility] for evaluation of a cough.  Patient lives in a facility.  Patient report for the past month she has intermittently had  recurrent cough.  Cough is nonproductive.  She endorsed some occasional burning sensation in her chest, endorsed nasal drainage.  She reported not feeling well.  She however denies having any fever or chills, headache, ear pain, sore throat, shortness of breath, chest pain, abdominal pain, urinary symptoms.  She has had a flu shot.    Past Medical History:  Diagnosis Date  . Bradycardic cardiac arrest ([place]) 07/24/2016   Required CPR, intubation with multiple rib fractures. Had short term cardiac shock with acute heart failure.  Resulted in possible stress-induced cardiomyopathy  . Cardiac related syncope 07/24/2016   Bradycardic arrest requiring CPR 2 with transvenous pacemaker placed followed by permanent pacemaker; complicated by respiratory arrest, type II MI, stress-induced cardiopathy  . Cardiomyopathy ([place]) 07/2016   Moderate severely reduced EF with apical hypokinesis suggestive of stress-induced cardiac myopathy versus multivessel CAD --> in setting of her to cardiac arrest  . COPD (chronic obstructive pulmonary disease) ([place])    By report  . History of chronic constipation   . Hypercholesterolemia   . Osteoporosis   . Pacemaker 07/26/2016   Abbott dual-chamber pacemaker: [place] #[id number] - Serial [id number]  . Pneumonia 08/2016  . Sarcoidosis of lung ([place])   . Spinal  stenosis     Patient Active Problem List   Diagnosis Date Noted  . Hyponatremia 12/23/2018  . Disorientation   . Hyponatremia syndrome 08/13/2018  . Acute metabolic encephalopathy 34/74/2595  . Aspiration pneumonia of both lower lobes due to gastric secretions ([place]) 08/13/2018  . Intractable headache 04/07/2018  . Abnormal CT of the abdomen 04/07/2018  . CHB (complete heart block) ([place]) 12/20/2016  . Chronic systolic congestive heart failure ([place]) 12/20/2016  . Angioedema 12/17/2016  . Chronic rhinitis 12/17/2016  . Dyspnea 11/09/2016  . Upper airway cough syndrome 10/29/2016  . Abnormal CT of the chest  c/w lipoid pna 10/29/2016  . Toe ulcer, right ([place]) 10/17/2016  . S/P placement of cardiac pacemaker 09/19/2016  . History of syncope 09/19/2016  . Bradycardic cardiac arrest ([place]) 07/24/2016    Past Surgical History:  Procedure Laterality Date  . ADENOIDECTOMY    . PACEMAKER INSERTION Left 07/26/2016   Abbott Assurity Model #[id number] - Serial #[id number] -- complicated by pneumothorax requiring chest tube placement  . TEE WITHOUT CARDIOVERSION  02/2013   EF 55-60% with normal global function. No thrombus, mass or vegetation. Trivial-small pericardial effusion. Moderate LA dilation. Mild MR.  . TONSILLECTOMY    . TRANSTHORACIC ECHOCARDIOGRAM  07/24/2016   [facility]: EF 45-50%. Mid and apical inferior, inferolateral and apical hypokinesis. GR 2 DD. Mild LA and moderate RA dilation. Mild paracardial effusion. Moderate to severe TR. Basal and mid segment hypokinesis with apical hypokinesis. - Suspect stress-induced cardiopathy versus multivessel CAD.  Marland Kitchen TRANSTHORACIC ECHOCARDIOGRAM  07/26/2016   [place]: LIMITED (  post PPM) - although the report suggested EF 30-35% with apical akinesis, rounding note suggested this was an improvement     OB History   No obstetric history on file.      Home Medications    Prior to Admission  medications   Medication Sig Start Date End Date Taking? Authorizing Provider  albuterol (PROVENTIL HFA;VENTOLIN HFA) 108 (90 Base) MCG/ACT inhaler Inhale 2 puffs into the lungs every 6 (six) hours as needed for wheezing or shortness of breath. 09/12/18   Saguier, Percell Miller, PA-C  amLODipine (NORVASC) 2.5 MG tablet TAKE 1 TABLET BY MOUTH EVERY DAY Patient taking differently: Take 2.5 mg by mouth daily.  10/31/18   Saguier, Percell Miller, PA-C  Ascorbic Acid (VITAMIN C) 1000 MG tablet Take 1,000 mg by mouth daily.    [provider]  atorvastatin (LIPITOR) 10 MG tablet Take 1/2 tablet (5mg ) by mouth daily Patient taking differently: Take 5 mg by mouth daily at 6 PM.  10/31/18   Saguier, Percell Miller, PA-C  benzonatate (TESSALON) 100 MG capsule Take 1 capsule (100 mg total) by mouth 3 (three) times daily as needed for cough. 12/19/18   Saguier, Percell Miller, PA-C  Biotin 10000 MCG TABS Take 10,000 mcg by mouth daily.     [provider]  docusate sodium (COLACE) 100 MG capsule Take 100 mg by mouth daily as needed for mild constipation.     [provider]  guaiFENesin-dextromethorphan (ROBITUSSIN DM) 100-10 MG/5ML syrup Take 5 mLs by mouth every 4 (four) hours as needed (chest congestion). 12/25/18   Reyne Dumas, MD  hydroxypropyl methylcellulose / hypromellose (ISOPTO TEARS / GONIOVISC) 2.5 % ophthalmic solution Place 1 drop into both eyes 3 (three) times daily.     [provider]  ipratropium (ATROVENT HFA) 17 MCG/ACT inhaler Inhale 2 puffs into the lungs every 6 (six) hours as needed for wheezing. 09/12/18   Saguier, Percell Miller, PA-C  levocetirizine (XYZAL) 5 MG tablet Take 5 mg by mouth every evening.    [provider]  Multiple Vitamins-Minerals (MULTIVITAMIN WITH MINERALS) tablet Take 1 tablet by mouth daily.    [provider]  mupirocin cream (BACTROBAN) 2 % Apply 1 application topically 2 (two) times daily. 04/16/18   Saguier, Percell Miller, PA-C  pantoprazole (PROTONIX)  40 MG tablet Take 1 tablet (40 mg total) by mouth daily. 12/25/18   Reyne Dumas, MD    Family History Family History  Problem Relation Age of Onset  . Allergic rhinitis Neg Hx   . Angioedema Neg Hx   . Asthma Neg Hx   . Eczema Neg Hx   . Immunodeficiency Neg Hx   . Urticaria Neg Hx     Social History Social History   Tobacco Use  . Smoking status: Former Smoker    Packs/day: 1.50    Years: 17.00    Pack years: 25.50    Types: Cigarettes    Start date: 12/18/1963    Last attempt to quit: 11/20/1983    Years since quitting: 35.1  . Smokeless tobacco: Never Used  Substance Use Topics  . Alcohol use: No  . Drug use: No     Allergies   Dust mite extract; Levaquin [levofloxacin in d5w]; Lidex [fluocinonide]; Molds & smuts; Rifampin; Lactose; and Penicillins   Review of Systems Review of Systems  Respiratory: Positive for cough.   All other systems reviewed and are negative.    Physical Exam Updated Vital Signs BP 115/74 (BP Location: Right Arm)   Pulse 73  Temp 97.9 F (36.6 C) (Oral)   Resp 16   Ht 4\' 9"  (1.448 m)   Wt 44.3 kg   SpO2 97%   BMI 21.13 kg/m   Physical Exam Vitals signs and nursing note reviewed.  Constitutional:      General: She is not in acute distress.    Appearance: She is well-developed.     Comments: Elderly female, frail appearing in no acute discomfort  HENT:     Head: Atraumatic.     Nose: Nose normal.     Mouth/Throat:     Mouth: Mucous membranes are moist.  Eyes:     Conjunctiva/sclera: Conjunctivae normal.  Neck:     Musculoskeletal: Neck supple. No neck rigidity.  Cardiovascular:     Rate and Rhythm: Rhythm irregular.     Pulses: Normal pulses.  Pulmonary:     Breath sounds: Wheezing and rhonchi present.  Abdominal:     Palpations: Abdomen is soft.     Tenderness: There is no abdominal tenderness.  Musculoskeletal:        General: No swelling.  Skin:    Findings: No rash.  Neurological:     Mental Status: She is  alert and oriented to person, place, and time.  Psychiatric:        Mood and Affect: Mood normal.      ED Treatments / Results  Labs (all labs ordered are listed, but only abnormal results are displayed) Labs Reviewed  BASIC METABOLIC PANEL - Abnormal; Notable for the following components:      Result Value   Sodium 134 (*)    Potassium 3.1 (*)    Chloride 97 (*)    Creatinine, Ser 0.41 (*)    Calcium 8.1 (*)    All other components within normal limits  BRAIN NATRIURETIC PEPTIDE - Abnormal; Notable for the following components:   B Natriuretic Peptide 315.2 (*)    All other components within normal limits  CBC WITH DIFFERENTIAL/PLATELET - Abnormal; Notable for the following components:   Hemoglobin 11.7 (*)    Neutro Abs 8.0 (*)    Monocytes Absolute 1.3 (*)    All other components within normal limits  INFLUENZA PANEL BY PCR (TYPE A & B)  URINALYSIS, ROUTINE W REFLEX MICROSCOPIC  TROPONIN I  TROPONIN I  TROPONIN I    EKG EKG Interpretation  Date/Time:  Friday January 02 2019 14:57:05 EST Ventricular Rate:  72 PR Interval:    QRS Duration: 139 QT Interval:  428 QTC Calculation: 469 R Axis:   -81 Text Interpretation:  Atrial-sensed ventricular-paced rhythm No further analysis attempted due to paced rhythm agree. no change from previous Confirmed by Charlesetta Shanks [phone]) on 01/02/2019 6:10:18 PM   ED ECG REPORT   Date: 01/02/2019  Rate: 72  Rhythm: atrial-sensed ventricular-paced rhythm  QRS Axis: left  Intervals: normal  ST/T Wave abnormalities: indeterminate  Conduction Disutrbances:left bundle branch block  Narrative Interpretation:   Old EKG Reviewed: unchanged  I have personally reviewed the EKG tracing and agree with the computerized printout as noted.   Radiology Dg Chest 2 View  Result Date: 01/02/2019 CLINICAL DATA:  Cough and not feeling well. EXAM: CHEST - 2 VIEW COMPARISON:  Chest x-ray and chest CT 12/23/2018 FINDINGS: The heart is  enlarged but stable. Stable tortuosity and calcification of the thoracic aorta. The pacer wires are stable. Stable emphysematous changes and pulmonary scarring. Persistent lingular densities. No new/acute findings. No pleural effusions. The bony thorax appears stable. Remote  midthoracic compression deformities. IMPRESSION: Chronic emphysematous changes and chronic lingular densities. No pleural effusion or pneumothorax. Electronically Signed   By: Marijo Sanes M.D.   On: 01/02/2019 16:31    Procedures Procedures (including critical care time)  Medications Ordered in ED Medications  benzonatate (TESSALON) capsule 100 mg (100 mg Oral Given 01/02/19 1601)  ipratropium-albuterol (DUONEB) 0.5-2.5 (3) MG/3ML nebulizer solution 3 mL (3 mLs Nebulization Given 01/02/19 1602)  potassium chloride SA (K-DUR,KLOR-CON) CR tablet 40 mEq (40 mEq Oral Given 01/02/19 1659)  ipratropium-albuterol (DUONEB) 0.5-2.5 (3) MG/3ML nebulizer solution 3 mL (3 mLs Nebulization Given 01/02/19 1833)  furosemide (LASIX) injection 20 mg (20 mg Intravenous Given 01/02/19 1832)     Initial Impression / Assessment and Plan / ED Course  I have reviewed the triage vital signs and the nursing notes.  Pertinent labs & imaging results that were available during my care of the patient were reviewed by me and considered in my medical decision making (see chart for details).     BP 127/72   Pulse 76   Temp 97.9 F (36.6 C) (Oral)   Resp (!) 24   Ht 4\' 9"  (1.448 m)   Wt 44.3 kg   SpO2 97%   BMI 21.13 kg/m    Final Clinical Impressions(s) / ED Diagnoses   Final diagnoses:  Bronchitis  Hypoxia    ED Discharge Orders    None     3:41 PM Patient history sarcoidosis, CHF, here with cough and not feeling well.  Symptoms seem to be an ongoing issue for the least the past month.  She has had a flu shot.  She denies any fever or myalgias to suggest the flu.  She does have rhonchi and wheezes on exam.  She does not complain of  any shortness of breath.  Given her age, work-up initiated, will check a chest x-ray, provide cough medication.  Suspect adventitious lung sounds likely secondary to her sarcoidosis.  5:05 PM Hx CHF, EF 50-55%.  BNP today is 315, mildly increased from prior.  Mild hypokalemia with K+ 3.1.  Normal WBC, normal flu PCR, CXR shows chronic emphysematous changes and chronic lingular densities.  No pleural effusion or pneumothorax.   When ambulating her O2 drops to 81% and pt was symptomatic.  Given wheeze, rhonchus breath sounds with increase BNP, pt given Lasix 20mg  IV along with another duonebs.  I discussed care with DR. Pfeiffer.  Plan to admit for further management of her condition.    8:05 PM Appreciate consultation from [facility] Dr. Roel Cluck who agrees to admit pt. She request additional imaging to assess for pt's hypoxia.  Will obtain Chest CTA.  Pt's renal function is normal.    Domenic Moras, PA-C 01/02/19 2005    Charlesetta Shanks, MD 01/03/19 0030

## 2019-01-02 NOTE — ED Notes (Signed)
ED TO INPATIENT HANDOFF REPORT  Name/Age/Gender Tracey Holt 85 y.o. female  Code Status Code Status History    Date Active Date Inactive Code Status Order ID Comments User Context   12/23/2018 1636 12/25/2018 1922 Partial Code [id number]  Elmarie Shiley, MD ED   12/06/2018 0803 12/07/2018 1657 Full Code [id number]  Guilford Shi, MD ED   08/13/2018 0858 08/15/2018 1552 Full Code [id number]  Louellen Molder, MD Inpatient   04/07/2018 0455 04/08/2018 1705 Full Code [id number]  Etta Quill, DO Inpatient    Questions for Most Recent Historical Code Status (Order [id number])    Question Answer Comment   In the event of cardiac or respiratory ARREST: Initiate Code Blue, Call Rapid Response Yes    In the event of cardiac or respiratory ARREST: Perform CPR Yes    In the event of cardiac or respiratory ARREST: Perform Intubation/Mechanical Ventilation No    In the event of cardiac or respiratory ARREST: Use NIPPV/BiPAp only if indicated Yes    In the event of cardiac or respiratory ARREST: Administer ACLS medications if indicated Yes    In the event of cardiac or respiratory ARREST: Perform Defibrillation or Cardioversion if indicated Yes         Advance Directive Documentation     Most Recent Value  Type of Advance Directive  Healthcare Power of Attorney  Pre-existing out of facility DNR order (yellow form or pink MOST form)  -  "MOST" Form in Place?  -      Home/SNF/Other Home  Chief Complaint cough; common cold  Level of Care/Admitting Diagnosis ED Disposition    ED Disposition Condition [place]: [place] [100102]  Level of Care: Telemetry [5]  Admit to tele based on following criteria: Other see comments  Comments: dyspnea, chf  Diagnosis: Acute respiratory failure with hypoxia [facility]) [[id number]]  Admitting Physician: Toy Baker [[id number]]  Attending Physician: Toy Baker [[id number]]  PT Class (Do Not Modify): Observation  [104]  PT Acc Code (Do Not Modify): Observation [10022]       Medical History Past Medical History:  Diagnosis Date  . Bradycardic cardiac arrest ([place]) 07/24/2016   Required CPR, intubation with multiple rib fractures. Had short term cardiac shock with acute heart failure.  Resulted in possible stress-induced cardiomyopathy  . Cardiac related syncope 07/24/2016   Bradycardic arrest requiring CPR 2 with transvenous pacemaker placed followed by permanent pacemaker; complicated by respiratory arrest, type II MI, stress-induced cardiopathy  . Cardiomyopathy ([place]) 07/2016   Moderate severely reduced EF with apical hypokinesis suggestive of stress-induced cardiac myopathy versus multivessel CAD --> in setting of her to cardiac arrest  . COPD (chronic obstructive pulmonary disease) ([place])    By report  . History of chronic constipation   . Hypercholesterolemia   . Osteoporosis   . Pacemaker 07/26/2016   Abbott dual-chamber pacemaker: [place] #[id number] - Serial [id number]  . Pneumonia 08/2016  . Sarcoidosis of lung ([place])   . Spinal stenosis     Allergies Allergies  Allergen Reactions  . Dust Mite Extract Shortness Of Breath  . Levaquin [Levofloxacin In D5w] Shortness Of Breath  . Lidex [Fluocinonide] Swelling    Caused lip swelling  . Molds & Smuts Shortness Of Breath  . Rifampin Shortness Of Breath  . Lactose Other (See Comments)    Unknown rxn per pt  . Penicillins Swelling    Did it involve swelling of the face/tongue/throat, SOB, or low BP?  No Did it involve sudden or severe rash/hives, skin peeling, or any reaction on the inside of your mouth or nose? Unknown Did you need to seek medical attention at a hospital or doctor's office? Unknown When did it last happen?Unknown If all above answers are "NO", may proceed with cephalosporin use.     IV Location/Drains/Wounds Patient Lines/Drains/Airways Status   Active Line/Drains/Airways    Name:   Placement date:    Placement time:   Site:   Days:   Peripheral IV 01/02/19 Right;Lateral Wrist   01/02/19    1548    Wrist   less than 1          Labs/Imaging Results for orders placed or performed during the hospital encounter of 01/02/19 (from the past 48 hour(s))  Basic metabolic panel     Status: Abnormal   Collection Time: 01/02/19  3:50 PM  Result Value Ref Range   Sodium 134 (L) 135 - 145 mmol/L   Potassium 3.1 (L) 3.5 - 5.1 mmol/L   Chloride 97 (L) 98 - 111 mmol/L   CO2 27 22 - 32 mmol/L   Glucose, Bld 99 70 - 99 mg/dL   BUN 12 8 - 23 mg/dL   Creatinine, Ser 0.41 (L) 0.44 - 1.00 mg/dL   Calcium 8.1 (L) 8.9 - 10.3 mg/dL   GFR calc non Af Amer >60 >60 mL/min   GFR calc Af Amer >60 >60 mL/min   Anion gap 10 5 - 15    Comment: Performed at [facility], [place] [address] [place], [place] [address]  Brain natriuretic peptide     Status: Abnormal   Collection Time: 01/02/19  3:50 PM  Result Value Ref Range   B Natriuretic Peptide 315.2 (H) 0.0 - 100.0 pg/mL    Comment: Performed at [facility], [place] [address] [place], [place] [address]  CBC with Differential     Status: Abnormal   Collection Time: 01/02/19  3:50 PM  Result Value Ref Range   WBC 10.2 4.0 - 10.5 K/uL   RBC 4.00 3.87 - 5.11 MIL/uL   Hemoglobin 11.7 (L) 12.0 - 15.0 g/dL   HCT 36.8 36.0 - 46.0 %   MCV 92.0 80.0 - 100.0 fL   MCH 29.3 26.0 - 34.0 pg   MCHC 31.8 30.0 - 36.0 g/dL   RDW 14.4 11.5 - 15.5 %   Platelets 203 150 - 400 K/uL   nRBC 0.0 0.0 - 0.2 %   Neutrophils Relative % 78 %   Neutro Abs 8.0 (H) 1.7 - 7.7 K/uL   Lymphocytes Relative 8 %   Lymphs Abs 0.8 0.7 - 4.0 K/uL   Monocytes Relative 13 %   Monocytes Absolute 1.3 (H) 0.1 - 1.0 K/uL   Eosinophils Relative 0 %   Eosinophils Absolute 0.0 0.0 - 0.5 K/uL   Basophils Relative 0 %   Basophils Absolute 0.0 0.0 - 0.1 K/uL   Immature Granulocytes 1 %   Abs Immature Granulocytes 0.05 0.00 - 0.07 K/uL    Comment: Performed at  [facility], [place] [address] [place], [place] [address]  Influenza panel by PCR (type A & B)     Status: None   Collection Time: 01/02/19  3:50 PM  Result Value Ref Range   Influenza A By PCR NEGATIVE NEGATIVE   Influenza B By PCR NEGATIVE NEGATIVE    Comment: (NOTE) The Xpert Xpress Flu assay is intended as an aid in the  diagnosis of  influenza and should not be used as a sole basis for treatment.  This  assay is FDA approved for nasopharyngeal swab specimens only. Nasal  washings and aspirates are unacceptable for Xpert Xpress Flu testing. Performed at [facility], [place] [address] [place], [place] [address]    Dg Chest 2 View  Result Date: 01/02/2019 CLINICAL DATA:  Cough and not feeling well. EXAM: CHEST - 2 VIEW COMPARISON:  Chest x-ray and chest CT 12/23/2018 FINDINGS: The heart is enlarged but stable. Stable tortuosity and calcification of the thoracic aorta. The pacer wires are stable. Stable emphysematous changes and pulmonary scarring. Persistent lingular densities. No new/acute findings. No pleural effusions. The bony thorax appears stable. Remote midthoracic compression deformities. IMPRESSION: Chronic emphysematous changes and chronic lingular densities. No pleural effusion or pneumothorax. Electronically Signed   By: Marijo Sanes M.D.   On: 01/02/2019 16:31   EKG Interpretation  Date/Time:  Friday January 02 2019 14:57:05 EST Ventricular Rate:  72 PR Interval:    QRS Duration: 139 QT Interval:  428 QTC Calculation: 469 R Axis:   -81 Text Interpretation:  Atrial-sensed ventricular-paced rhythm No further analysis attempted due to paced rhythm agree. no change from previous Confirmed by Charlesetta Shanks [phone]) on 01/02/2019 6:10:18 PM   Pending Labs Unresulted Labs (From admission, onward)    Start     Ordered   01/02/19 2009  Magnesium  Add-on,   R     01/02/19 2008   01/02/19 1954  Troponin I - Now Then Q6H  Now then every 6  hours,   R     01/02/19 1953   01/02/19 1948  Urinalysis, Routine w reflex microscopic  Once,   R     01/02/19 1947          Vitals/Pain Today's Vitals   01/02/19 1700 01/02/19 1730 01/02/19 1830 01/02/19 1832  BP: 135/60 (!) 144/83 (!) 147/89 (!) 144/83  Pulse:   81 83  Resp: 20 18  15   Temp:      TempSrc:      SpO2:   99% 98%  Weight:      Height:      PainSc:        Isolation Precautions Droplet precaution  Medications Medications  potassium chloride 10 mEq in 100 mL IVPB (has no administration in time range)  benzonatate (TESSALON) capsule 100 mg (100 mg Oral Given 01/02/19 1601)  ipratropium-albuterol (DUONEB) 0.5-2.5 (3) MG/3ML nebulizer solution 3 mL (3 mLs Nebulization Given 01/02/19 1602)  potassium chloride SA (K-DUR,KLOR-CON) CR tablet 40 mEq (40 mEq Oral Given 01/02/19 1659)  ipratropium-albuterol (DUONEB) 0.5-2.5 (3) MG/3ML nebulizer solution 3 mL (3 mLs Nebulization Given 01/02/19 1833)  furosemide (LASIX) injection 20 mg (20 mg Intravenous Given 01/02/19 1832)    Mobility non-ambulatory

## 2019-01-02 NOTE — ED Triage Notes (Signed)
Per EMS- Patient is a resident of [facility]. Patient called her physician and was c/o a cough and not feeling well. Physician called EMS . Rhonchi present, Sats 95% on room air.

## 2019-01-02 NOTE — H&P (Signed)
Tracey Holt BJS:[id number] DOB: 02/22/1922 DOA: 01/02/2019     PCP: Mackie Pai, PA-C   Outpatient Specialists:  CARDS:  Dr.Mihai Croitoru    Patient arrived to ER on 01/02/19 at 1432  Patient coming from: From facility [facility]  Chief Complaint:  Chief Complaint  Patient presents with  . Cough    HPI: Tracey Holt is a 85 y.o. female with medical history significant of sarcoidosis, hyponatremia, dCHF Grade 1, hyponatremia,   Presented with   lack of energy and a headache.  She has not been feeling well since her discharge last week.  She has been trying to drink some water at home really propel drink 4 bottles yesterday.  She ate a little bit of soup but otherwise has not been eating much.  Reports headache, dysuria. Constipation unsure when was her last BM abdominal distention.  She has been having more shortness of breath.  Called PCP office who advised her to call EMS. Initially EMS came over and checked her out but felt she did not need to be brought to the hospital patient did not wish to be admitted to the hospital but eventually her primary care provider convince her to call again.  On EMS arrival satting 95% room air   Last time was admitted on this fourth to 6 17-Jan-2023 1 week ago.  Presented with subacute hyponatremia and syncope.  Thought to be secondary to dehydration initial sodium was 117 after rehydration improved to 133.  Patient was encouraged oral intake TSH and cortisol was checked unremarkable.  Nephrology.  Regarding pertinent Chronic problems:   History of hypertension but does not take anything for it at home. Patient status post pacemaker Last echogram was done in 2018 in 01/17/23 EF: 50% -   60% Grade 1 diastolic dysfunction  While in ER: Caryl Pina noted to have some wheezing and increased work of breathing  She noted to desaturate with ambulation down to 815 The following Work up has been ordered so far:  Orders Placed This Encounter    Procedures  . DG Chest 2 View  . Basic metabolic panel  . Brain natriuretic peptide  . CBC with Differential  . Influenza panel by PCR (type A & B)  . Cardiac monitoring  . Check Pulse Oximetry while ambulating  . Consult to hospitalist  . Droplet precaution  . Pulse oximetry (single)  . ED EKG  . EKG 12-Lead  . Insert peripheral IV   Following Medications were ordered in ER: Medications  benzonatate (TESSALON) capsule 100 mg (100 mg Oral Given 01/02/19 1601)  ipratropium-albuterol (DUONEB) 0.5-2.5 (3) MG/3ML nebulizer solution 3 mL (3 mLs Nebulization Given 01/02/19 1602)  potassium chloride SA (K-DUR,KLOR-CON) CR tablet 40 mEq (40 mEq Oral Given 01/02/19 1659)  ipratropium-albuterol (DUONEB) 0.5-2.5 (3) MG/3ML nebulizer solution 3 mL (3 mLs Nebulization Given 01/02/19 1833)  furosemide (LASIX) injection 20 mg (20 mg Intravenous Given 01/02/19 1832)    Significant initial  Findings: Abnormal Labs Reviewed  BASIC METABOLIC PANEL - Abnormal; Notable for the following components:      Result Value   Sodium 134 (*)    Potassium 3.1 (*)    Chloride 97 (*)    Creatinine, Ser 0.41 (*)    Calcium 8.1 (*)    All other components within normal limits  BRAIN NATRIURETIC PEPTIDE - Abnormal; Notable for the following components:   B Natriuretic Peptide 315.2 (*)    All other components within normal limits  CBC WITH DIFFERENTIAL/PLATELET -  Abnormal; Notable for the following components:   Hemoglobin 11.7 (*)    Neutro Abs 8.0 (*)    Monocytes Absolute 1.3 (*)    All other components within normal limits     Lactic Acid, Venous No results found for: LATICACIDVEN  Na 134 K 3.1  Cr   stable from baseline see below Lab Results  Component Value Date   CREATININE 0.41 (L) 01/02/2019   CREATININE 0.47 12/30/2018   CREATININE 0.40 (L) 12/25/2018     WBC  10.2   HG/HCT stable,      Component Value Date/Time   HGB 11.7 (L) 01/02/2019 1550   HCT 36.8 01/02/2019 1550     Troponin (Point of Care Test) No results for input(s): TROPIPOC in the last 72 hours.   BNP (last 3 results) Recent Labs    10/25/18 0939 12/23/18 0811 01/02/19 1550  BNP 94.6 113.7* 315.2*    ProBNP (last 3 results) Recent Labs    12/30/18 1409  PROBNP 147.0*     UA  ordered      CXR - NON acute KUB + stool    CTA chest - no PE, cardiomegaly, non acute  ECG:  Personally reviewed by me showing: HR : 72 Rhythm:  Paced  no evidence of ischemic changes QTC 469     ED Triage Vitals  Enc Vitals Group     BP 01/02/19 1444 115/74     Pulse Rate 01/02/19 1444 73     Resp 01/02/19 1444 16     Temp 01/02/19 1444 97.9 F (36.6 C)     Temp Source 01/02/19 1444 Oral     SpO2 01/02/19 1439 95 %     Weight 01/02/19 1450 97 lb 10.6 oz (44.3 kg)     Height 01/02/19 1450 4\' 9"  (1.448 m)     Head Circumference --      Peak Flow --      Pain Score 01/02/19 1449 0     Pain Loc --      Pain Edu? --      Excl. in [place]? --   TMAX(24)@       Latest  Blood pressure (!) 144/83, pulse 83, temperature 97.9 F (36.6 C), temperature source Oral, resp. rate 15, height 4\' 9"  (1.448 m), weight 44.3 kg, SpO2 98 %.      Hospitalist was called for admission for acute hypoxia   Review of Systems:    Pertinent positives include: fatigue, constipation, dysuria, headache Chest congestion change in bowel habits, dyspnea on exertion Constitutional: headaches No weight loss, night sweats, Fevers, chills, , weight loss  HEENT:  No , Difficulty swallowing,Tooth/dental problems,Sore throat,  No sneezing, itching, ear ache, , post nasal drip,  Cardio-vascular:  No chest pain, Orthopnea, PND, anasarca, dizziness, palpitations.no Bilateral lower extremity swelling  GI:  No heartburn, indigestion, abdominal pain, nausea, vomiting, diarrhea,  loss of appetite, melena, blood in stool, hematemesis Resp:  no shortness of breath at rest. No , No excess mucus, no productive cough, No  non-productive cough, No coughing up of blood.No change in color of mucus.No wheezing. Skin:  no rash or lesions. No jaundice GU:  no dysuria, change in color of urine, no urgency or frequency. No straining to urinate.  No flank pain.  Musculoskeletal:  No joint pain or no joint swelling. No decreased range of motion. No back pain.  Psych:  No change in mood or affect. No depression or anxiety. No memory loss.  Neuro: no localizing neurological complaints, no tingling, no weakness, no double vision, no gait abnormality, no slurred speech, no confusion  All systems reviewed and apart from [place] all are negative  Past Medical History:   Past Medical History:  Diagnosis Date  . Bradycardic cardiac arrest ([place]) 07/24/2016   Required CPR, intubation with multiple rib fractures. Had short term cardiac shock with acute heart failure.  Resulted in possible stress-induced cardiomyopathy  . Cardiac related syncope 07/24/2016   Bradycardic arrest requiring CPR 2 with transvenous pacemaker placed followed by permanent pacemaker; complicated by respiratory arrest, type II MI, stress-induced cardiopathy  . Cardiomyopathy ([place]) 07/2016   Moderate severely reduced EF with apical hypokinesis suggestive of stress-induced cardiac myopathy versus multivessel CAD --> in setting of her to cardiac arrest  . COPD (chronic obstructive pulmonary disease) ([place])    By report  . History of chronic constipation   . Hypercholesterolemia   . Osteoporosis   . Pacemaker 07/26/2016   Abbott dual-chamber pacemaker: [employer] #[id number] - Serial [id number]  . Pneumonia 08/2016  . Sarcoidosis of lung ([place])   . Spinal stenosis       Past Surgical History:  Procedure Laterality Date  . ADENOIDECTOMY    . PACEMAKER INSERTION Left 07/26/2016   Abbott Assurity Model #[id number] - Serial #[id number] -- complicated by pneumothorax requiring chest tube placement  . TEE WITHOUT CARDIOVERSION  02/2013   EF 55-60% with  normal global function. No thrombus, mass or vegetation. Trivial-small pericardial effusion. Moderate LA dilation. Mild MR.  . TONSILLECTOMY    . TRANSTHORACIC ECHOCARDIOGRAM  07/24/2016   [facility]: EF 45-50%. Mid and apical inferior, inferolateral and apical hypokinesis. GR 2 DD. Mild LA and moderate RA dilation. Mild paracardial effusion. Moderate to severe TR. Basal and mid segment hypokinesis with apical hypokinesis. - Suspect stress-induced cardiopathy versus multivessel CAD.  Marland Kitchen TRANSTHORACIC ECHOCARDIOGRAM  07/26/2016   [place]: LIMITED (post PPM) - although the report suggested EF 30-35% with apical akinesis, rounding note suggested this was an improvement    Social History:  Ambulatory occasional  walker     reports that she quit smoking about 35 years ago. Her smoking use included cigarettes. She started smoking about 55 years ago. She has a 25.50 pack-year smoking history. She has never used smokeless tobacco. She reports that she does not drink alcohol or use drugs.     Family History:   Family History  Problem Relation Age of Onset  . Allergic rhinitis Neg Hx   . Angioedema Neg Hx   . Asthma Neg Hx   . Eczema Neg Hx   . Immunodeficiency Neg Hx   . Urticaria Neg Hx     Allergies: Allergies  Allergen Reactions  . Dust Mite Extract Shortness Of Breath  . Levaquin [Levofloxacin In D5w] Shortness Of Breath  . Lidex [Fluocinonide] Swelling    Caused lip swelling  . Molds & Smuts Shortness Of Breath  . Rifampin Shortness Of Breath  . Lactose Other (See Comments)    Unknown rxn per pt  . Penicillins Swelling    Did it involve swelling of the face/tongue/throat, SOB, or low BP? No Did it involve sudden or severe rash/hives, skin peeling, or any reaction on the inside of your mouth or nose? Unknown Did you need to seek medical attention at a hospital or doctor's office? Unknown When did it last happen?Unknown If all  above answers are "NO", may proceed with cephalosporin use.  Prior to Admission medications   Medication Sig Start Date End Date Taking? Authorizing Provider  amLODipine (NORVASC) 2.5 MG tablet TAKE 1 TABLET BY MOUTH EVERY DAY Patient taking differently: Take 2.5 mg by mouth daily.  10/31/18  Yes Saguier, Percell Miller, PA-C  Ascorbic Acid (VITAMIN C) 1000 MG tablet Take 1,000 mg by mouth daily.   Yes [provider]  atorvastatin (LIPITOR) 10 MG tablet Take 1/2 tablet (5mg ) by mouth daily Patient taking differently: Take 5 mg by mouth daily at 6 PM.  10/31/18  Yes Saguier, Percell Miller, PA-C  benzonatate (TESSALON) 100 MG capsule Take 1 capsule (100 mg total) by mouth 3 (three) times daily as needed for cough. 12/19/18  Yes Saguier, Percell Miller, PA-C  Biotin 10000 MCG TABS Take 10,000 mcg by mouth daily.    Yes [provider]  docusate sodium (COLACE) 100 MG capsule Take 100 mg by mouth daily as needed for mild constipation.    Yes [provider]  guaiFENesin-dextromethorphan (ROBITUSSIN DM) 100-10 MG/5ML syrup Take 5 mLs by mouth every 4 (four) hours as needed (chest congestion). 12/25/18  Yes Reyne Dumas, MD  hydroxypropyl methylcellulose / hypromellose (ISOPTO TEARS / GONIOVISC) 2.5 % ophthalmic solution Place 1 drop into both eyes daily.    Yes [provider]  levocetirizine (XYZAL) 5 MG tablet Take 5 mg by mouth every evening.   Yes [provider]  Multiple Vitamins-Minerals (MULTIVITAMIN WITH MINERALS) tablet Take 1 tablet by mouth daily.   Yes [provider]  pantoprazole (PROTONIX) 40 MG tablet Take 1 tablet (40 mg total) by mouth daily. Patient taking differently: Take 40 mg by mouth daily as needed (heartburn).  12/25/18  Yes Reyne Dumas, MD  albuterol (PROVENTIL HFA;VENTOLIN HFA) 108 (90 Base) MCG/ACT inhaler Inhale 2 puffs into the lungs every 6 (six) hours as needed for wheezing or shortness of breath. Patient not taking: Reported on  01/02/2019 09/12/18   Saguier, Percell Miller, PA-C  ipratropium (ATROVENT HFA) 17 MCG/ACT inhaler Inhale 2 puffs into the lungs every 6 (six) hours as needed for wheezing. Patient not taking: Reported on 01/02/2019 09/12/18   Saguier, Percell Miller, PA-C  mupirocin cream (BACTROBAN) 2 % Apply 1 application topically 2 (two) times daily. Patient not taking: Reported on 01/02/2019 04/16/18   Saguier, Percell Miller, PA-C   Physical Exam: Blood pressure (!) 144/83, pulse 83, temperature 97.9 F (36.6 C), temperature source Oral, resp. rate 15, height 4\' 9"  (1.448 m), weight 44.3 kg, SpO2 98 %. 1. General:  in No  Acute distress   Chronically ill  -appearing 2. Psychological: Alert and  Oriented to self hard of hearing  3. Head/ENT:    Dry Mucous Membranes                          Head Non traumatic, neck supple                           Poor Dentition 4. SKIN:  decreased Skin turgor,  Skin clean Dry and intact no rash 5. Heart: Regular rate and rhythm systolic  Murmur, no Rub or gallop 6. Lungs:   Clear to auscultation bilaterally, no wheezes or crackles   7. Abdomen: Soft,  non-tender, distended  bowel sounds present 8. Lower extremities: no clubbing, cyanosis, no  edema 9. Neurologically Grossly intact, moving all 4 extremities equally  10. MSK: Normal range of motion   LABS:     Recent  Labs  Lab 12/30/18 1409 01/02/19 1550  WBC 7.0 10.2  NEUTROABS 4.6 8.0*  HGB 12.0 11.7*  HCT 35.5* 36.8  MCV 86.8 92.0  PLT 269.0 622   Basic Metabolic Panel: Recent Labs  Lab 12/30/18 1409 01/02/19 1550  NA 132* 134*  K 4.3 3.1*  CL 97 97*  CO2 27 27  GLUCOSE 97 99  BUN 15 12  CREATININE 0.47 0.41*  CALCIUM 9.1 8.1*      Recent Labs  Lab 12/30/18 1409  AST 31  ALT 44*  ALKPHOS 50  BILITOT 0.6  PROT 5.9*  ALBUMIN 3.8   No results for input(s): LIPASE, AMYLASE in the last 168 hours. No results for input(s): AMMONIA in the last 168 hours.    HbA1C: No results for input(s): HGBA1C in the last  72 hours. CBG: No results for input(s): GLUCAP in the last 168 hours.    Urine analysis:    Component Value Date/Time   COLORURINE STRAW (A) 12/23/2018 1643   APPEARANCEUR CLEAR 12/23/2018 1643   LABSPEC 1.025 12/23/2018 1643   PHURINE 6.0 12/23/2018 1643   GLUCOSEU 150 (A) 12/23/2018 1643   HGBUR NEGATIVE 12/23/2018 1643   BILIRUBINUR NEGATIVE 12/23/2018 1643   BILIRUBINUR negative 10/23/2017 1607   KETONESUR 5 (A) 12/23/2018 1643   PROTEINUR NEGATIVE 12/23/2018 1643   UROBILINOGEN 0.2 10/23/2017 1607   NITRITE NEGATIVE 12/23/2018 1643   LEUKOCYTESUR NEGATIVE 12/23/2018 1643       Cultures:    Component Value Date/Time   SDES BLOOD BLOOD RIGHT HAND 08/13/2018 1222   SDES BLOOD RIGHT ANTECUBITAL 08/13/2018 1222   SPECREQUEST  08/13/2018 1222    BOTTLES DRAWN AEROBIC ONLY Blood Culture results may not be optimal due to an inadequate volume of blood received in culture bottles   SPECREQUEST  08/13/2018 1222    BOTTLES DRAWN AEROBIC ONLY Blood Culture adequate volume   CULT  08/13/2018 1222    NO GROWTH 5 DAYS Performed at [facility], [place] [address] [place], [place] [address]    CULT  08/13/2018 1222    NO GROWTH 5 DAYS Performed at [facility], [place] [address], [place], [place] [address]    REPTSTATUS 08/18/2018 FINAL 08/13/2018 1222   REPTSTATUS 08/18/2018 FINAL 08/13/2018 1222     Radiological Exams on Admission: Dg Chest 2 View  Result Date: 01/02/2019 CLINICAL DATA:  Cough and not feeling well. EXAM: CHEST - 2 VIEW COMPARISON:  Chest x-ray and chest CT 12/23/2018 FINDINGS: The heart is enlarged but stable. Stable tortuosity and calcification of the thoracic aorta. The pacer wires are stable. Stable emphysematous changes and pulmonary scarring. Persistent lingular densities. No new/acute findings. No pleural effusions. The bony thorax appears stable. Remote midthoracic compression deformities. IMPRESSION: Chronic emphysematous changes and chronic  lingular densities. No pleural effusion or pneumothorax. Electronically Signed   By: Marijo Sanes M.D.   On: 01/02/2019 16:31    Chart has been reviewed    Assessment/Plan  85 y.o. female with medical history significant of sarcoidosis, hyponatremia, dCHF Grade 1, hyponatremia, Admitted for acute hypoxia  Present on Admission: . Acute respiratory failure with hypoxia ([place]) unclear etiology will initiate oxygen patient was given Lasix 20 mg with some diuresis she does have diastolic CHF but no history of leg edema no evidence of fluid overload on chest x-ray avoid over aggressive fluid resuscitation supposedly has a history of sarcoidosis CT did not show any significant changes . Upper airway cough syndrome treat supportively  may have some bronchitis component.  Patient has albuterol as needed currently no evidence of wheezing will hold off on steroids . Dyspnea unclear etiology of hypoxia chest x-ray without any evidence of acute changes CTA negative for PE  . CHB (complete heart block) ([place]) status post May pacemaker continue to monitor . Constipation bowel regimen and monitor for improvement may need repeat imaging if no BM . Hypokalemia -  will replace and repeat in AM,  check magnesium level and replace as needed UTI -  - treat with Rocephin   Allergic to fluroquinolones        await results of urine culture and adjust antibiotic coverage as needed    Other plan as per orders.  DVT prophylaxis:    Lovenox     Code Status:    DNR/DNI   as per patient    I had personally discussed CODE STATUS with patient   Family Communication:   Family not  at  Bedside    Disposition Plan:                           Back to current facility when stable                                           Would benefit from PT/OT eval prior to DC  Ordered                                     Social Work  consulted                                        Consults called: none  Admission status:     Obs    Level of care     tele  For 12H        Cartha Rotert 01/02/2019, 9:17 PM    [facility]     after 2 AM please page floor coverage PA If 7AM-7PM, please contact the day team taking care of the patient using [email or web address]

## 2019-01-02 NOTE — Telephone Encounter (Signed)
Pt may have severe low sodium again and need to be evaluated in ED.

## 2019-01-02 NOTE — Telephone Encounter (Signed)
Pt. States that EMS came earlier and "checked me out and left." Explained to pt. That she needs to go to ED for evaluation per her PCP.States she understands but she does not want to stay and be admitted. Agrees to go and asks this triage nurse to call 911 for her. 911 called and will dispatch to pt.'s address.Stayed on the phone with pt. Until EMS arrived.  Answer Assessment - Initial Assessment Questions 1. LEVEL OF CONSCIOUSNESS: "How is he (she, the patient) acting right now?" (e.g., alert-oriented, confused, lethargic, stuporous, comatose)     Alert 2. ONSET: "When did the confusion start?"  (minutes, hours, days)     Today - does not remember prior triage nurse sending 911 and telling her to go to ED. 3. PATTERN "Does this come and go, or has it been constant since it started?"  "Is it present now?"     States she will go with EMS to be evaluated in ED. 4. ALCOHOL or DRUGS: "Has he been drinking alcohol or taking any drugs?"         Unknown 5. NARCOTIC MEDICATIONS: "Has he been receiving any narcotic medications?" (e.g., morphine, Vicodin)     Unknown 6. CAUSE: "What do you think is causing the confusion?"      Pt. Does not know - she is home alone. 7. OTHER SYMPTOMS: "Are there any other symptoms?" (e.g., difficulty breathing, headache, fever, weakness)     Unknown  Protocols used: CONFUSION - DELIRIUM-A-AH

## 2019-01-02 NOTE — ED Notes (Signed)
Patient transported to X-ray 

## 2019-01-02 NOTE — ED Notes (Signed)
Bed: WA02 Expected date:  Expected time:  Means of arrival:  Comments: 85 yo cough, weakness

## 2019-01-02 NOTE — ED Notes (Signed)
Pulse ox while ambulating went down to 81%. Pt sounded congested while ambulating.

## 2019-01-03 ENCOUNTER — Observation Stay (HOSPITAL_BASED_OUTPATIENT_CLINIC_OR_DEPARTMENT_OTHER): Payer: Medicare HMO

## 2019-01-03 DIAGNOSIS — N390 Urinary tract infection, site not specified: Secondary | ICD-10-CM | POA: Diagnosis not present

## 2019-01-03 DIAGNOSIS — R0600 Dyspnea, unspecified: Secondary | ICD-10-CM

## 2019-01-03 DIAGNOSIS — R140 Abdominal distension (gaseous): Secondary | ICD-10-CM | POA: Diagnosis not present

## 2019-01-03 DIAGNOSIS — J40 Bronchitis, not specified as acute or chronic: Secondary | ICD-10-CM | POA: Diagnosis not present

## 2019-01-03 DIAGNOSIS — E876 Hypokalemia: Secondary | ICD-10-CM | POA: Diagnosis not present

## 2019-01-03 DIAGNOSIS — J9601 Acute respiratory failure with hypoxia: Principal | ICD-10-CM

## 2019-01-03 LAB — COMPREHENSIVE METABOLIC PANEL
ALT: 36 U/L (ref 0–44)
AST: 30 U/L (ref 15–41)
Albumin: 3.4 g/dL — ABNORMAL LOW (ref 3.5–5.0)
Alkaline Phosphatase: 43 U/L (ref 38–126)
Anion gap: 11 (ref 5–15)
BUN: 9 mg/dL (ref 8–23)
CO2: 26 mmol/L (ref 22–32)
Calcium: 8.3 mg/dL — ABNORMAL LOW (ref 8.9–10.3)
Chloride: 96 mmol/L — ABNORMAL LOW (ref 98–111)
Creatinine, Ser: 0.48 mg/dL (ref 0.44–1.00)
GFR calc Af Amer: >60 mL/min (ref >60)
GFR calc non Af Amer: >60 mL/min (ref >60)
Glucose, Bld: 114 mg/dL — ABNORMAL HIGH (ref 70–99)
Potassium: 3.3 mmol/L — ABNORMAL LOW (ref 3.5–5.1)
Sodium: 133 mmol/L — ABNORMAL LOW (ref 135–145)
Total Bilirubin: 1 mg/dL (ref 0.3–1.2)
Total Protein: 6 g/dL — ABNORMAL LOW (ref 6.5–8.1)

## 2019-01-03 LAB — ECHOCARDIOGRAM COMPLETE
HEIGHTINCHES: 57 in
Weight: 1562.62 oz

## 2019-01-03 LAB — CBC
HCT: 36.7 % (ref 36.0–46.0)
Hemoglobin: 11.8 g/dL — ABNORMAL LOW (ref 12.0–15.0)
MCH: 28.9 pg (ref 26.0–34.0)
MCHC: 32.2 g/dL (ref 30.0–36.0)
MCV: 90 fL (ref 80.0–100.0)
Platelets: 231 10*3/uL (ref 150–400)
RBC: 4.08 MIL/uL (ref 3.87–5.11)
RDW: 14.4 % (ref 11.5–15.5)
WBC: 11.7 10*3/uL — ABNORMAL HIGH (ref 4.0–10.5)
nRBC: 0 % (ref 0.0–0.2)

## 2019-01-03 LAB — MAGNESIUM
Magnesium: 1.7 mg/dL (ref 1.7–2.4)
Magnesium: 1.7 mg/dL (ref 1.7–2.4)

## 2019-01-03 LAB — TROPONIN I
Troponin I: <0.03 ng/mL (ref <0.03)
Troponin I: <0.03 ng/mL (ref <0.03)

## 2019-01-03 LAB — TSH: TSH: 5.102 u[IU]/mL — ABNORMAL HIGH (ref 0.350–4.500)

## 2019-01-03 LAB — MRSA PCR SCREENING: MRSA by PCR: NEGATIVE

## 2019-01-03 LAB — PHOSPHORUS: Phosphorus: 2.3 mg/dL — ABNORMAL LOW (ref 2.5–4.6)

## 2019-01-03 MED ORDER — POTASSIUM & SODIUM PHOSPHATES 280-160-250 MG PO PACK
1.0000 | PACK | Freq: Three times a day (TID) | ORAL | Status: DC
  Administered 2019-01-03 – 2019-01-05 (×9): 1 via ORAL
  Filled 2019-01-03 (×12): qty 1

## 2019-01-03 MED ORDER — SIMETHICONE 80 MG PO CHEW
80.0000 mg | CHEWABLE_TABLET | Freq: Once | ORAL | Status: AC
  Administered 2019-01-03: 80 mg via ORAL
  Filled 2019-01-03: qty 1

## 2019-01-03 NOTE — Evaluation (Signed)
Physical Therapy Evaluation Patient Details Name: Tracey Holt MRN: [id number] DOB: [birth year] Today's Date: 01/03/2019   History of Present Illness  Pt admitted with acute respiratory failure with hypoxia and with hx of pacemaker, CHF, cardiomyopathy, COPD and spinal stenosis  Clinical Impression  Pt admitted as above and presenting with functional mobility limitations 2* generalized weakness and mild ambulatory balance deficits.  Pt should progress to return to previous living arrangement at [facility].    Follow Up Recommendations Home health PT    Equipment Recommendations  None recommended by PT    Recommendations for Other Services       Precautions / Restrictions Precautions Precautions: Fall Restrictions Weight Bearing Restrictions: No      Mobility  Bed Mobility Overal bed mobility: Needs Assistance Bed Mobility: Supine to Sit;Sit to Supine     Supine to sit: Modified independent (Device/Increase time) Sit to supine: Modified independent (Device/Increase time)      Transfers Overall transfer level: Needs assistance Equipment used: None Transfers: Sit to/from Stand Sit to Stand: Min guard;Supervision         General transfer comment: min guard for safety  Ambulation/Gait Ambulation/Gait assistance: Supervision;Min guard Gait Distance (Feet): 220 Feet Assistive device: None Gait Pattern/deviations: Step-through pattern;Decreased stride length;Shuffle;Decreased step length - left     General Gait Details: supervision to min guard for safety. Pt with no LOB, but takes short, shuffling steps. Pt satting 90-92% and HR elevated to 104 with activity. Pt with slight unsteadiness during ambulation - pt states she is typically very steady but she has not walked in a couple of days.   Stairs            Wheelchair Mobility    Modified Rankin (Stroke Patients Only)       Balance Overall balance assessment: Needs assistance Sitting-balance  support: Feet supported;No upper extremity supported Sitting balance-Leahy Scale: Good     Standing balance support: During functional activity Standing balance-Leahy Scale: Fair Standing balance comment: Pt able to tolerate mild challenges to standing balance                             Pertinent Vitals/Pain Pain Assessment: No/denies pain    Home Living Family/patient expects to be discharged to:: Assisted living               Home Equipment: None      Prior Function Level of Independence: Independent         Comments: Pt reports walking for 30 minutes a day, taking part in chair aerobics class, and walking to cafeteria to get meals. Pt reports making herself breakfast.      Hand Dominance   Dominant Hand: Right    Extremity/Trunk Assessment   Upper Extremity Assessment Upper Extremity Assessment: Generalized weakness    Lower Extremity Assessment Lower Extremity Assessment: Generalized weakness    Cervical / Trunk Assessment Cervical / Trunk Assessment: Kyphotic  Communication   Communication: HOH  Cognition Arousal/Alertness: Awake/alert Behavior During Therapy: WFL for tasks assessed/performed Overall Cognitive Status: History of cognitive impairments - at baseline                                 General Comments: WFL for tasks observed.       General Comments      Exercises     Assessment/Plan    PT Assessment Patient  needs continued PT services  PT Problem List Decreased balance;Decreased cognition;Decreased activity tolerance;Decreased mobility       PT Treatment Interventions Balance training;Patient/family education;Gait training;Therapeutic exercise;Therapeutic activities    PT Goals (Current goals can be found in the Care Plan section)  Acute Rehab PT Goals Patient Stated Goal: to go home PT Goal Formulation: With patient Time For Goal Achievement: 01/08/19 Potential to Achieve Goals: Good     Frequency Min 3X/week   Barriers to discharge        Co-evaluation               AM-PAC PT "6 Clicks" Mobility  Outcome Measure Help needed turning from your back to your side while in a flat bed without using bedrails?: None Help needed moving from lying on your back to sitting on the side of a flat bed without using bedrails?: None Help needed moving to and from a bed to a chair (including a wheelchair)?: A Little Help needed standing up from a chair using your arms (e.g., wheelchair or bedside chair)?: A Little Help needed to walk in hospital room?: A Little Help needed climbing 3-5 steps with a railing? : A Little 6 Click Score: 20    End of Session Equipment Utilized During Treatment: Gait belt Activity Tolerance: Patient tolerated treatment well Patient left: in bed;with call bell/phone within reach;with bed alarm set Nurse Communication: Mobility status(O2 sats with ambulation) PT Visit Diagnosis: Difficulty in walking, not elsewhere classified (R26.2);Unsteadiness on feet (R26.81)    Time: 1130-1150 PT Time Calculation (min) (ACUTE ONLY): 20 min   Charges:   PT Evaluation $PT Eval Low Complexity: 1 Low         [place] Pager [phone] Office [phone]   Jema Deegan 01/03/2019, 12:54 PM

## 2019-01-03 NOTE — Progress Notes (Signed)
PROGRESS NOTE    Tracey Holt  HGD:[id number] DOB: 07/04/1922 DOA: 01/02/2019 PCP: Mackie Pai, PA-C    Brief Narrative:  Tracey Holt is a 85 y.o. female with medical history significant of sarcoidosis, hyponatremia, dCHF Grade 1, hyponatremia, Presented with   lack of energy and a headache.  She has not been feeling well since her discharge last week.   Assessment & Plan:   Principal Problem:   Acute respiratory failure with hypoxia (HCC) Active Problems:   Upper airway cough syndrome   Dyspnea   CHB (complete heart block) (HCC)   Constipation   Hypokalemia   Acute lower UTI   Acute respiratory failure with hypoxia Resolved.    Acute bronchitis:  Improving.    Hypokalemia: replaced.    Acute lower UTI: Urine cultures sent and she was started on rocephin.    Hypophosphatemia:  Replaced.     DVT prophylaxis: Lovenox.  Code Status: DNR Family Communication: none at bedside.  Disposition Plan: pending clinical improvement.    Consultants:   None.    Procedures: None.   Antimicrobials : Rocephin.   Subjective: Pt reports feeling weak,   Objective: Vitals:   01/03/19 0527 01/03/19 0745 01/03/19 1434 01/03/19 1454  BP: 134/89  110/65   Pulse: (!) 104  94   Resp: 16  (!) 23   Temp: 97.9 F (36.6 C)  98.3 F (36.8 C)   TempSrc: Oral  Oral   SpO2: 94% 91% 92% 93%  Weight:      Height:        Intake/Output Summary (Last 24 hours) at 01/03/2019 1619 Last data filed at 01/03/2019 1433 Gross per 24 hour  Intake 1836.5 ml  Output 500 ml  Net 1336.5 ml   Filed Weights   01/02/19 1450  Weight: 44.3 kg    Examination:  General exam: Appears calm and comfortable  Respiratory system: Clear to auscultation. Respiratory effort normal. Cardiovascular system: S1 & S2 heard, RRR. No JVD,  No pedal edema. Gastrointestinal system: Abdomen is nondistended, soft and nontender . Normal bowel sounds heard. Central nervous system: Alert and oriented.  No focal neurological deficits. Extremities: Symmetric 5 x 5 power. Skin: No rashes, lesions or ulcers Psychiatry: . Mood & affect appropriate.     Data Reviewed: I have personally reviewed following labs and imaging studies  CBC: Recent Labs  Lab 12/30/18 1409 01/02/19 1550 01/03/19 0224  WBC 7.0 10.2 11.7*  NEUTROABS 4.6 8.0*  --   HGB 12.0 11.7* 11.8*  HCT 35.5* 36.8 36.7  MCV 86.8 92.0 90.0  PLT 269.0 203 962   Basic Metabolic Panel: Recent Labs  Lab 12/30/18 1409 01/02/19 1550 01/02/19 2116 01/03/19 0224  NA 132* 134*  --  133*  K 4.3 3.1*  --  3.3*  CL 97 97*  --  96*  CO2 27 27  --  26  GLUCOSE 97 99  --  114*  BUN 15 12  --  9  CREATININE 0.47 0.41*  --  0.48  CALCIUM 9.1 8.1*  --  8.3*  MG  --   --  1.8 1.7  PHOS  --   --   --  2.3*   GFR: Estimated Creatinine Clearance: 26.2 mL/min (by C-G formula based on SCr of 0.48 mg/dL). Liver Function Tests: Recent Labs  Lab 12/30/18 1409 01/03/19 0224  AST 31 30  ALT 44* 36  ALKPHOS 50 43  BILITOT 0.6 1.0  PROT 5.9* 6.0*  ALBUMIN 3.8 3.4*  No results for input(s): LIPASE, AMYLASE in the last 168 hours. No results for input(s): AMMONIA in the last 168 hours. Coagulation Profile: No results for input(s): INR, PROTIME in the last 168 hours. Cardiac Enzymes: Recent Labs  Lab 01/02/19 2116 01/03/19 0224 01/03/19 0853  TROPONINI <0.03 <0.03 <0.03   BNP (last 3 results) Recent Labs    12/30/18 1409  PROBNP 147.0*   HbA1C: No results for input(s): HGBA1C in the last 72 hours. CBG: No results for input(s): GLUCAP in the last 168 hours. Lipid Profile: No results for input(s): CHOL, HDL, LDLCALC, TRIG, CHOLHDL, LDLDIRECT in the last 72 hours. Thyroid Function Tests: Recent Labs    01/03/19 0224  TSH 5.102*   Anemia Panel: No results for input(s): VITAMINB12, FOLATE, FERRITIN, TIBC, IRON, RETICCTPCT in the last 72 hours. Sepsis Labs: No results for input(s): PROCALCITON, LATICACIDVEN in the  last 168 hours.  Recent Results (from the past 240 hour(s))  MRSA PCR Screening     Status: None   Collection Time: 01/03/19 12:26 AM  Result Value Ref Range Status   MRSA by PCR NEGATIVE NEGATIVE Final    Comment:        The GeneXpert MRSA Assay (FDA approved for NASAL specimens only), is one component of a comprehensive MRSA colonization surveillance program. It is not intended to diagnose MRSA infection nor to guide or monitor treatment for MRSA infections. Performed at [facility], [place] [address] [place], [place] [address]          Radiology Studies: Dg Chest 2 View  Result Date: 01/02/2019 CLINICAL DATA:  Cough and not feeling well. EXAM: CHEST - 2 VIEW COMPARISON:  Chest x-ray and chest CT 12/23/2018 FINDINGS: The heart is enlarged but stable. Stable tortuosity and calcification of the thoracic aorta. The pacer wires are stable. Stable emphysematous changes and pulmonary scarring. Persistent lingular densities. No new/acute findings. No pleural effusions. The bony thorax appears stable. Remote midthoracic compression deformities. IMPRESSION: Chronic emphysematous changes and chronic lingular densities. No pleural effusion or pneumothorax. Electronically Signed   By: Marijo Sanes M.D.   On: 01/02/2019 16:31   Dg Abd 1 View  Result Date: 01/02/2019 CLINICAL DATA:  Abdominal distension. EXAM: ABDOMEN - 1 VIEW COMPARISON:  12/23/2018 FINDINGS: Colonic stool and scattered bowel gas are noted. No dilated loops of bowel suggestive of obstruction are evident, and the overall appearance is similar to the recent prior study except for mildly increased colonic stool. Evaluation for intraperitoneal free air is limited on this supine study. Small calcifications in the pelvis on the right are unchanged and may represent phleboliths. There is moderate thoracolumbar levoscoliosis. IMPRESSION: No evidence of bowel obstruction. Electronically Signed   By: Logan Bores M.D.    On: 01/02/2019 20:49   Ct Angio Chest Pe W And/or Wo Contrast  Result Date: 01/02/2019 CLINICAL DATA:  Cough, history of sarcoid, CHF and rhinitis EXAM: CT ANGIOGRAPHY CHEST WITH CONTRAST TECHNIQUE: Multidetector CT imaging of the chest was performed using the standard protocol during bolus administration of intravenous contrast. Multiplanar CT image reconstructions and MIPs were obtained to evaluate the vascular anatomy. CONTRAST:  12mL ISOVUE-370 IOPAMIDOL (ISOVUE-370) INJECTION 76% COMPARISON:  CXR 01/02/2019 and chest CT 12/23/2018 and 10/12/2016 PET CT FINDINGS: Cardiovascular: The study is of quality for the evaluation of pulmonary embolism. There are no filling defects in the central, lobar, segmental or subsegmental pulmonary artery branches to suggest acute pulmonary embolism. Great vessels are normal in course and caliber with atherosclerosis.  Cardiomegaly is noted without pericardial effusion. Dual chamber pacer from left-sided approach with leads in the right atrial appendage and adjacent to the apical right ventricular septum. No significant pericardial fluid/thickening. Mediastinum/Nodes: No discrete thyroid nodules. Unremarkable esophagus. Small hiatal hernia. No pathologically enlarged axillary, mediastinal or hilar lymph nodes. Lungs/Pleura: Chronic triangular opacity in the lingula as seen on the coronal reformats, series 7/50 felt secondary to chronic benign process given stability since 2017. no new pulmonary consolidation, dominant mass, effusion or pneumothorax. Upper abdomen: Unremarkable. Musculoskeletal: Stable kyphoscoliosis and degenerative disc disease. Remote T6 through T10 compression fractures with levoconvex curvature at the thoracolumbar junction. Review of the MIP images confirms the above findings. IMPRESSION: 1. No acute pulmonary embolus. 2. Chronic triangular opacity in the lingula felt to be secondary to chronic benign process given stability since 2017. 3. Stable  cardiomegaly. 4. Thoracic kyphosis with multilevel compression fractures from T6 through T10. Aortic Atherosclerosis (ICD10-I70.0). Electronically Signed   By: Ashley Royalty M.D.   On: 01/02/2019 20:56        Scheduled Meds: . amLODipine  2.5 mg Oral Daily  . atorvastatin  5 mg Oral q1800  . enoxaparin (LOVENOX) injection  30 mg Subcutaneous Q24H  . guaiFENesin  600 mg Oral BID  . ipratropium  0.5 mg Nebulization TID  . levalbuterol  0.63 mg Nebulization TID  . senna  1 tablet Oral BID  . sodium chloride flush  3 mL Intravenous Q12H   Continuous Infusions: . sodium chloride 250 mL (01/02/19 2124)  . cefTRIAXone (ROCEPHIN)  IV 1 g (01/03/19 0058)     LOS: 0 days    Time spent: 29 minutes.     Hosie Poisson, MD [facility] Pager [phone]  If 7PM-7AM, please contact night-coverage [email or web address] Password [facility] 01/03/2019, 4:19 PM

## 2019-01-03 NOTE — Progress Notes (Signed)
Pt ambulated approx 120 ft.with walker. Oxygen sat was 94% on RA before ambulating and maintained 91-92% during ambulation. No complaints or signs of SOB.

## 2019-01-04 ENCOUNTER — Observation Stay (HOSPITAL_COMMUNITY): Payer: Medicare HMO

## 2019-01-04 DIAGNOSIS — R140 Abdominal distension (gaseous): Secondary | ICD-10-CM | POA: Diagnosis not present

## 2019-01-04 DIAGNOSIS — E876 Hypokalemia: Secondary | ICD-10-CM | POA: Diagnosis not present

## 2019-01-04 DIAGNOSIS — J40 Bronchitis, not specified as acute or chronic: Secondary | ICD-10-CM | POA: Diagnosis not present

## 2019-01-04 DIAGNOSIS — N390 Urinary tract infection, site not specified: Secondary | ICD-10-CM | POA: Diagnosis not present

## 2019-01-04 DIAGNOSIS — J9601 Acute respiratory failure with hypoxia: Secondary | ICD-10-CM | POA: Diagnosis not present

## 2019-01-04 LAB — CBC
HCT: 33.6 % — ABNORMAL LOW (ref 36.0–46.0)
Hemoglobin: 10.8 g/dL — ABNORMAL LOW (ref 12.0–15.0)
MCH: 29.1 pg (ref 26.0–34.0)
MCHC: 32.1 g/dL (ref 30.0–36.0)
MCV: 90.6 fL (ref 80.0–100.0)
Platelets: 214 10*3/uL (ref 150–400)
RBC: 3.71 MIL/uL — ABNORMAL LOW (ref 3.87–5.11)
RDW: 14.4 % (ref 11.5–15.5)
WBC: 7.8 10*3/uL (ref 4.0–10.5)
nRBC: 0 % (ref 0.0–0.2)

## 2019-01-04 LAB — OSMOLALITY, URINE: Osmolality, Ur: 440 mOsm/kg (ref 300–900)

## 2019-01-04 LAB — T4, FREE: Free T4: 0.82 ng/dL (ref 0.82–1.77)

## 2019-01-04 LAB — BASIC METABOLIC PANEL
ANION GAP: 9 (ref 5–15)
BUN: 13 mg/dL (ref 8–23)
CO2: 25 mmol/L (ref 22–32)
Calcium: 8.1 mg/dL — ABNORMAL LOW (ref 8.9–10.3)
Chloride: 96 mmol/L — ABNORMAL LOW (ref 98–111)
Creatinine, Ser: 0.46 mg/dL (ref 0.44–1.00)
GFR calc Af Amer: >60 mL/min (ref >60)
Glucose, Bld: 103 mg/dL — ABNORMAL HIGH (ref 70–99)
Potassium: 3.5 mmol/L (ref 3.5–5.1)
Sodium: 130 mmol/L — ABNORMAL LOW (ref 135–145)

## 2019-01-04 LAB — OSMOLALITY: Osmolality: 273 mOsm/kg — ABNORMAL LOW (ref 275–295)

## 2019-01-04 MED ORDER — DOCUSATE SODIUM 100 MG PO CAPS: 100.0000 mg | ORAL_CAPSULE | Freq: Every day | ORAL | Status: DC | PRN

## 2019-01-04 NOTE — Progress Notes (Signed)
PROGRESS NOTE    Tracey Holt  OVF:[id number] DOB: Jun 14, 1922 DOA: 01/02/2019 PCP: Mackie Pai, PA-C    Brief Narrative:  Tracey Holt is a 85 y.o. female with medical history significant of sarcoidosis, hyponatremia, dCHF Grade 1, hyponatremia, Presented with   lack of energy and a headache.  She has not been feeling well since her discharge last week.   Assessment & Plan:   Principal Problem:   Acute respiratory failure with hypoxia (HCC) Active Problems:   Upper airway cough syndrome   Dyspnea   CHB (complete heart block) (HCC)   Constipation   Hypokalemia   Acute lower UTI   Acute respiratory failure with hypoxia Resolved. She ambulated and her oxygen level has been greater than 90%.    Acute bronchitis:  Improving.    Hypokalemia: replaced.    Acute lower UTI: Urine cultures sent and she was started on rocephin.  Urine cultures grew 1,00,000 gram negative rods.     Hypophosphatemia:  Replaced.    Hyponatremia:  - chronic,? TSH abnormal.  Get free t3 and free t4.  Get urine osmo, serum osmolarity.     DVT prophylaxis: Lovenox.  Code Status: DNR Family Communication: none at bedside.  Disposition Plan: pending clinical improvement.    Consultants:   None.    Procedures: None.   Antimicrobials : Rocephin.   Subjective: Pt reports still feel weak.  No chest pain or sob, no nausea, vomiting, no abdominal pain.   Objective: Vitals:   01/03/19 1957 01/04/19 0509 01/04/19 0907 01/04/19 1404  BP:  121/73  106/66  Pulse:  91  86  Resp:  20  (!) 24  Temp:  98 F (36.7 C)  98.1 F (36.7 C)  TempSrc:    Oral  SpO2: 94% 94% 94% 99%  Weight:      Height:        Intake/Output Summary (Last 24 hours) at 01/04/2019 1516 Last data filed at 01/04/2019 1406 Gross per 24 hour  Intake 1300 ml  Output -  Net 1300 ml   Filed Weights   01/02/19 1450  Weight: 44.3 kg    Examination:  General exam: Appears calm and comfortable    Respiratory system: Clear to auscultation. Respiratory effort normal. No wheezing or rhonchi.  Cardiovascular system: S1 & S2 heard, RRR. No JVD,  No pedal edema. Gastrointestinal system: Abdomen is nondistended, soft and nontender.  Normal bowel sounds heard. Central nervous system: Alert and oriented. No focal neurological deficits. Extremities: Symmetric 5 x 5 power. Skin: No rashes, lesions or ulcers Psychiatry: . Mood & affect appropriate.     Data Reviewed: I have personally reviewed following labs and imaging studies  CBC: Recent Labs  Lab 12/30/18 1409 01/02/19 1550 01/03/19 0224 01/04/19 0529  WBC 7.0 10.2 11.7* 7.8  NEUTROABS 4.6 8.0*  --   --   HGB 12.0 11.7* 11.8* 10.8*  HCT 35.5* 36.8 36.7 33.6*  MCV 86.8 92.0 90.0 90.6  PLT 269.0 203 231 841   Basic Metabolic Panel: Recent Labs  Lab 12/30/18 1409 01/02/19 1550 01/02/19 2116 01/03/19 0224 01/03/19 0940 01/04/19 0529  NA 132* 134*  --  133*  --  130*  K 4.3 3.1*  --  3.3*  --  3.5  CL 97 97*  --  96*  --  96*  CO2 27 27  --  26  --  25  GLUCOSE 97 99  --  114*  --  103*  BUN 15 12  --  9  --  13  CREATININE 0.47 0.41*  --  0.48  --  0.46  CALCIUM 9.1 8.1*  --  8.3*  --  8.1*  MG  --   --  1.8 1.7 1.7  --   PHOS  --   --   --  2.3*  --   --    GFR: Estimated Creatinine Clearance: 26.2 mL/min (by C-G formula based on SCr of 0.46 mg/dL). Liver Function Tests: Recent Labs  Lab 12/30/18 1409 01/03/19 0224  AST 31 30  ALT 44* 36  ALKPHOS 50 43  BILITOT 0.6 1.0  PROT 5.9* 6.0*  ALBUMIN 3.8 3.4*   No results for input(s): LIPASE, AMYLASE in the last 168 hours. No results for input(s): AMMONIA in the last 168 hours. Coagulation Profile: No results for input(s): INR, PROTIME in the last 168 hours. Cardiac Enzymes: Recent Labs  Lab 01/02/19 2116 01/03/19 0224 01/03/19 0853  TROPONINI <0.03 <0.03 <0.03   BNP (last 3 results) Recent Labs    12/30/18 1409  PROBNP 147.0*   HbA1C: No  results for input(s): HGBA1C in the last 72 hours. CBG: No results for input(s): GLUCAP in the last 168 hours. Lipid Profile: No results for input(s): CHOL, HDL, LDLCALC, TRIG, CHOLHDL, LDLDIRECT in the last 72 hours. Thyroid Function Tests: Recent Labs    01/03/19 0224  TSH 5.102*   Anemia Panel: No results for input(s): VITAMINB12, FOLATE, FERRITIN, TIBC, IRON, RETICCTPCT in the last 72 hours. Sepsis Labs: No results for input(s): PROCALCITON, LATICACIDVEN in the last 168 hours.  Recent Results (from the past 240 hour(s))  Urine Culture     Status: Abnormal (Preliminary result)   Collection Time: 01/02/19  8:11 PM  Result Value Ref Range Status   Specimen Description   Final    URINE, CLEAN CATCH Performed at [facility], [place] [address] [place], [place] [address]    Special Requests   Final    NONE Performed at [facility], [place] [address], [place], [place] [address]    Culture >=100,000 COLONIES/mL ESCHERICHIA COLI (A)  Final   Report Status PENDING  Incomplete  MRSA PCR Screening     Status: None   Collection Time: 01/03/19 12:26 AM  Result Value Ref Range Status   MRSA by PCR NEGATIVE NEGATIVE Final    Comment:        The GeneXpert MRSA Assay (FDA approved for NASAL specimens only), is one component of a comprehensive MRSA colonization surveillance program. It is not intended to diagnose MRSA infection nor to guide or monitor treatment for MRSA infections. Performed at [facility], [place] [address] [place], [place] [address]          Radiology Studies: Dg Chest 2 View  Result Date: 01/02/2019 CLINICAL DATA:  Cough and not feeling well. EXAM: CHEST - 2 VIEW COMPARISON:  Chest x-ray and chest CT 12/23/2018 FINDINGS: The heart is enlarged but stable. Stable tortuosity and calcification of the thoracic aorta. The pacer wires are stable. Stable emphysematous changes and pulmonary scarring. Persistent  lingular densities. No new/acute findings. No pleural effusions. The bony thorax appears stable. Remote midthoracic compression deformities. IMPRESSION: Chronic emphysematous changes and chronic lingular densities. No pleural effusion or pneumothorax. Electronically Signed   By: Marijo Sanes M.D.   On: 01/02/2019 16:31   Dg Abd 1 View  Result Date: 01/02/2019 CLINICAL DATA:  Abdominal distension. EXAM: ABDOMEN - 1 VIEW COMPARISON:  12/23/2018 FINDINGS: Colonic  stool and scattered bowel gas are noted. No dilated loops of bowel suggestive of obstruction are evident, and the overall appearance is similar to the recent prior study except for mildly increased colonic stool. Evaluation for intraperitoneal free air is limited on this supine study. Small calcifications in the pelvis on the right are unchanged and may represent phleboliths. There is moderate thoracolumbar levoscoliosis. IMPRESSION: No evidence of bowel obstruction. Electronically Signed   By: Logan Bores M.D.   On: 01/02/2019 20:49   Ct Angio Chest Pe W And/or Wo Contrast  Result Date: 01/02/2019 CLINICAL DATA:  Cough, history of sarcoid, CHF and rhinitis EXAM: CT ANGIOGRAPHY CHEST WITH CONTRAST TECHNIQUE: Multidetector CT imaging of the chest was performed using the standard protocol during bolus administration of intravenous contrast. Multiplanar CT image reconstructions and MIPs were obtained to evaluate the vascular anatomy. CONTRAST:  153mL ISOVUE-370 IOPAMIDOL (ISOVUE-370) INJECTION 76% COMPARISON:  CXR 01/02/2019 and chest CT 12/23/2018 and 10/12/2016 PET CT FINDINGS: Cardiovascular: The study is of quality for the evaluation of pulmonary embolism. There are no filling defects in the central, lobar, segmental or subsegmental pulmonary artery branches to suggest acute pulmonary embolism. Great vessels are normal in course and caliber with atherosclerosis. Cardiomegaly is noted without pericardial effusion. Dual chamber pacer from left-sided  approach with leads in the right atrial appendage and adjacent to the apical right ventricular septum. No significant pericardial fluid/thickening. Mediastinum/Nodes: No discrete thyroid nodules. Unremarkable esophagus. Small hiatal hernia. No pathologically enlarged axillary, mediastinal or hilar lymph nodes. Lungs/Pleura: Chronic triangular opacity in the lingula as seen on the coronal reformats, series 7/50 felt secondary to chronic benign process given stability since 2017. no new pulmonary consolidation, dominant mass, effusion or pneumothorax. Upper abdomen: Unremarkable. Musculoskeletal: Stable kyphoscoliosis and degenerative disc disease. Remote T6 through T10 compression fractures with levoconvex curvature at the thoracolumbar junction. Review of the MIP images confirms the above findings. IMPRESSION: 1. No acute pulmonary embolus. 2. Chronic triangular opacity in the lingula felt to be secondary to chronic benign process given stability since 2017. 3. Stable cardiomegaly. 4. Thoracic kyphosis with multilevel compression fractures from T6 through T10. Aortic Atherosclerosis (ICD10-I70.0). Electronically Signed   By: Ashley Royalty M.D.   On: 01/02/2019 20:56        Scheduled Meds: . amLODipine  2.5 mg Oral Daily  . atorvastatin  5 mg Oral q1800  . enoxaparin (LOVENOX) injection  30 mg Subcutaneous Q24H  . guaiFENesin  600 mg Oral BID  . ipratropium  0.5 mg Nebulization TID  . levalbuterol  0.63 mg Nebulization TID  . potassium & sodium phosphates  1 packet Oral TID WC & HS  . senna  1 tablet Oral BID  . sodium chloride flush  3 mL Intravenous Q12H   Continuous Infusions: . sodium chloride 250 mL (01/02/19 2124)  . cefTRIAXone (ROCEPHIN)  IV 1 g (01/03/19 2124)     LOS: 0 days    Time spent: 29 minutes.     Hosie Poisson, MD [facility] Pager [phone]  If 7PM-7AM, please contact night-coverage [email or web address] Password [facility] 01/04/2019, 3:16 PM

## 2019-01-04 NOTE — Progress Notes (Signed)
Pt c/o feeling very "bloated" with abdominal distention and increasing abdominal discomfort. Abd very tender to palpation. Bowel sounds present, but pt states she is unable to pass gas. Pt has been ambulating in hallway with NT. Abd XR ordered and pt aware. MD to come assess pt.

## 2019-01-04 NOTE — Evaluation (Signed)
Occupational Therapy Evaluation Patient Details Name: Tracey Holt MRN: [id number] DOB: [birth year] Today's Date: 01/04/2019    History of Present Illness Pt admitted with acute respiratory failure with hypoxia and with hx of pacemaker, CHF, cardiomyopathy, COPD and spinal stenosis   Clinical Impression   PATIENT WAS SEEN FOR SKILLED OT TO INCREASE I AND SAFETY WITH ADLS AND MOBILITY. PATIENT HAS DECREASED ABILITY TO CARE FOR HERSELF FROM BASELINE. PATIENT TO BE FOLLOWED BY ACUTE OT TO MAXIMIZE I AND SAFETY PRIOR TO RETURNING TO ALF.    Follow Up Recommendations  Home health OT    Equipment Recommendations  None recommended by OT    Recommendations for Other Services       Precautions / Restrictions Precautions Precautions: Fall Restrictions Weight Bearing Restrictions: No      Mobility Bed Mobility         Supine to sit: Modified independent (Device/Increase time)        Transfers       Sit to Stand: Min guard         General transfer comment: MIN GUARD ASSIST WITH AMB INTO BATHROOM WITH WALKER.    Balance                                           ADL either performed or assessed with clinical judgement   ADL   Eating/Feeding: Independent   Grooming: Wash/dry hands;Wash/dry face;Supervision/safety;Set up   Upper Body Bathing: Supervision/ safety;Set up;Sitting   Lower Body Bathing: Set up;Min guard;Sit to/from stand   Upper Body Dressing : Supervision/safety;Set up;Sitting   Lower Body Dressing: Min guard;Sit to/from stand   Toilet Transfer: Min guard;Ambulation   Toileting- Clothing Manipulation and Hygiene: Min guard       Functional mobility during ADLs: Min guard;Rolling walker General ADL Comments: PATIENT IS REQUIRING ADDITIONAL ASSIST FROM BASELINE.      Vision Baseline Vision/History: Wears glasses Wears Glasses: Reading only Patient Visual Report: No change from baseline       Perception     Praxis       Pertinent Vitals/Pain Pain Assessment: No/denies pain     Hand Dominance Right   Extremity/Trunk Assessment Upper Extremity Assessment Upper Extremity Assessment: Generalized weakness           Communication Communication Communication: HOH   Cognition Arousal/Alertness: Awake/alert Behavior During Therapy: WFL for tasks assessed/performed Overall Cognitive Status: History of cognitive impairments - at baseline                                 General Comments: [facility] FOR ADLS AND MOBILITY   General Comments       Exercises     Shoulder Instructions      Home Living Family/patient expects to be discharged to:: Assisted living                             Home Equipment: None          Prior Functioning/Environment Level of Independence: Independent        Comments: PPATIENT WAS ABLE TO MAKE LIGHT MEALS OR GO TO DINING ROOM.        OT Problem List: Decreased strength;Decreased activity tolerance;Decreased safety awareness      OT Treatment/Interventions: Self-care/ADL training;Therapeutic activities;Patient/family education  OT Goals(Current goals can be found in the care plan section) Acute Rehab OT Goals Patient Stated Goal: GO HOME OT Goal Formulation: With patient Time For Goal Achievement: 01/18/19 Potential to Achieve Goals: Good ADL Goals Pt Will Perform Grooming: Independently Pt Will Perform Upper Body Bathing: with modified independence Pt Will Perform Lower Body Bathing: with modified independence Pt Will Perform Upper Body Dressing: with modified independence Pt Will Perform Lower Body Dressing: with modified independence Pt Will Transfer to Toilet: with modified independence Pt Will Perform Toileting - Clothing Manipulation and hygiene: with modified independence Pt Will Perform Tub/Shower Transfer: with modified independence  OT Frequency: Min 2X/week   Barriers to D/C:            Co-evaluation               AM-PAC OT "6 Clicks" Daily Activity     Outcome Measure Help from another person eating meals?: None Help from another person taking care of personal grooming?: A Little Help from another person toileting, which includes using toliet, bedpan, or urinal?: A Little Help from another person bathing (including washing, rinsing, drying)?: A Little Help from another person to put on and taking off regular upper body clothing?: A Little Help from another person to put on and taking off regular lower body clothing?: A Little 6 Click Score: 19   End of Session Nurse Communication: (OK THERAPY)  Activity Tolerance: Patient tolerated treatment well Patient left: in chair;with call bell/phone within reach;with chair alarm set  OT Visit Diagnosis: Unsteadiness on feet (R26.81)                Time: 1030-1109 OT Time Calculation (min): 39 min Charges:  OT General Charges $OT Visit: 1 Visit OT Evaluation $OT Eval Low Complexity: 1 Low OT Treatments $Self Care/Home Management : 0-98 mins  6 CLICKS  Tracey Holt 01/04/2019, 11:10 AM

## 2019-01-05 ENCOUNTER — Telehealth: Payer: Self-pay | Admitting: *Deleted

## 2019-01-05 DIAGNOSIS — J40 Bronchitis, not specified as acute or chronic: Secondary | ICD-10-CM | POA: Diagnosis not present

## 2019-01-05 DIAGNOSIS — E876 Hypokalemia: Secondary | ICD-10-CM | POA: Diagnosis not present

## 2019-01-05 DIAGNOSIS — R140 Abdominal distension (gaseous): Secondary | ICD-10-CM | POA: Diagnosis not present

## 2019-01-05 DIAGNOSIS — N390 Urinary tract infection, site not specified: Secondary | ICD-10-CM | POA: Diagnosis not present

## 2019-01-05 DIAGNOSIS — J9601 Acute respiratory failure with hypoxia: Secondary | ICD-10-CM | POA: Diagnosis not present

## 2019-01-05 LAB — CBC
HCT: 34.1 % — ABNORMAL LOW (ref 36.0–46.0)
HEMOGLOBIN: 11.1 g/dL — AB (ref 12.0–15.0)
MCH: 29 pg (ref 26.0–34.0)
MCHC: 32.6 g/dL (ref 30.0–36.0)
MCV: 89 fL (ref 80.0–100.0)
Platelets: 232 10*3/uL (ref 150–400)
RBC: 3.83 MIL/uL — ABNORMAL LOW (ref 3.87–5.11)
RDW: 14.4 % (ref 11.5–15.5)
WBC: 5.8 10*3/uL (ref 4.0–10.5)
nRBC: 0 % (ref 0.0–0.2)

## 2019-01-05 LAB — BASIC METABOLIC PANEL
Anion gap: 7 (ref 5–15)
BUN: 12 mg/dL (ref 8–23)
CO2: 29 mmol/L (ref 22–32)
Calcium: 8.6 mg/dL — ABNORMAL LOW (ref 8.9–10.3)
Chloride: 95 mmol/L — ABNORMAL LOW (ref 98–111)
Creatinine, Ser: 0.5 mg/dL (ref 0.44–1.00)
GFR calc Af Amer: >60 mL/min (ref >60)
GFR calc non Af Amer: >60 mL/min (ref >60)
Glucose, Bld: 123 mg/dL — ABNORMAL HIGH (ref 70–99)
Potassium: 3.5 mmol/L (ref 3.5–5.1)
Sodium: 131 mmol/L — ABNORMAL LOW (ref 135–145)

## 2019-01-05 LAB — PHOSPHORUS: PHOSPHORUS: 3.8 mg/dL (ref 2.5–4.6)

## 2019-01-05 LAB — CORTISOL: Cortisol, Plasma: 9.8 ug/dL

## 2019-01-05 LAB — URINE CULTURE: Culture: >=100000 — AB

## 2019-01-05 MED ORDER — CEPHALEXIN 500 MG PO CAPS: 500.0000 mg | ORAL_CAPSULE | Freq: Two times a day (BID) | ORAL | 0 refills | Status: DC

## 2019-01-05 NOTE — Progress Notes (Signed)
Discharge instructions reviewed with patient & Wilber Bihari. No change from am assessment. Pt a&ox4, oob 1 assist. Pt discharged with all belongings. Questions concerns denied.

## 2019-01-05 NOTE — Discharge Summary (Signed)
Physician Discharge Summary  Tracey Holt PXT:[id number] DOB: December 20, 1921 DOA: 01/02/2019  PCP: Mackie Pai, PA-C  Admit date: 01/02/2019 Discharge date: 01/05/2019  Admitted From: Home.  Disposition:  Home.   Recommendations for Outpatient Follow-up:  1. Follow up with PCP in 1-2 weeks 2. Please obtain BMP/CBC in one week   Home Health:yes    Discharge Condition:STABLE CODE STATUS: DNR Diet recommendation: regular diet.   Brief/Interim Summary: Tracey Holt a 85 y.o.femalewith medical history significant of sarcoidosis, hyponatremia, dCHFGrade 1, hyponatremia, Presented withlack of energy and a headache. She has not been feeling well since her discharge last week. She was found to have a UTI.   Discharge Diagnoses:  Principal Problem:   Acute respiratory failure with hypoxia (HCC) Active Problems:   Upper airway cough syndrome   Dyspnea   CHB (complete heart block) (HCC)   Constipation   Hypokalemia   Acute lower UTI  Acute respiratory failure with hypoxia Resolved. She ambulated and her oxygen level has been greater than 90%.    Acute bronchitis:  Much improved.    Hypokalemia: replaced.    Acute lower UTI: Urine cultures sent and she was started on rocephin.  Urine cultures grew 1,00,000 e coli sensitive to keflex. Discharged on keflex for 4 more days.  Discussed with the patient's son .     Hypophosphatemia:  Replaced.    Hyponatremia:  - chronic,? Possibly a component of SIADH.  - free t4 wnl. Sodium stable around 130's.     Discharge Instructions  Discharge Instructions    Diet - low sodium heart healthy   Complete by:  As directed    Increase activity slowly   Complete by:  As directed      Allergies as of 01/05/2019      Reactions   Dust Mite Extract Shortness Of Breath   Levaquin [levofloxacin In D5w] Shortness Of Breath   Lidex [fluocinonide] Swelling   Caused lip swelling   Molds & Smuts Shortness Of Breath    Rifampin Shortness Of Breath   Lactose Other (See Comments)   Unknown rxn per pt   Penicillins Swelling   Did it involve swelling of the face/tongue/throat, SOB, or low BP? No Did it involve sudden or severe rash/hives, skin peeling, or any reaction on the inside of your mouth or nose? Unknown Did you need to seek medical attention at a hospital or doctor's office? Unknown When did it last happen?Unknown If all above answers are "NO", may proceed with cephalosporin use.      Medication List    STOP taking these medications   mupirocin cream 2 % Commonly known as:  BACTROBAN     TAKE these medications   albuterol 108 (90 Base) MCG/ACT inhaler Commonly known as:  PROVENTIL HFA;VENTOLIN HFA Inhale 2 puffs into the lungs every 6 (six) hours as needed for wheezing or shortness of breath.   amLODipine 2.5 MG tablet Commonly known as:  NORVASC TAKE 1 TABLET BY MOUTH EVERY DAY   atorvastatin 10 MG tablet Commonly known as:  LIPITOR Take 1/2 tablet (5mg ) by mouth daily What changed:    how much to take  how to take this  when to take this  additional instructions   benzonatate 100 MG capsule Commonly known as:  TESSALON Take 1 capsule (100 mg total) by mouth 3 (three) times daily as needed for cough.   Biotin 10000 MCG Tabs Take 10,000 mcg by mouth daily.   cephALEXin 500 MG capsule Commonly known  as:  KEFLEX Take 1 capsule (500 mg total) by mouth 2 (two) times daily.   docusate sodium 100 MG capsule Commonly known as:  COLACE Take 100 mg by mouth daily as needed for mild constipation.   guaiFENesin-dextromethorphan 100-10 MG/5ML syrup Commonly known as:  ROBITUSSIN DM Take 5 mLs by mouth every 4 (four) hours as needed (chest congestion).   hydroxypropyl methylcellulose / hypromellose 2.5 % ophthalmic solution Commonly known as:  ISOPTO TEARS / GONIOVISC Place 1 drop into both eyes daily.   ipratropium 17 MCG/ACT inhaler Commonly known as:  ATROVENT  HFA Inhale 2 puffs into the lungs every 6 (six) hours as needed for wheezing.   levocetirizine 5 MG tablet Commonly known as:  XYZAL Take 5 mg by mouth every evening.   multivitamin with minerals tablet Take 1 tablet by mouth daily.   pantoprazole 40 MG tablet Commonly known as:  PROTONIX Take 1 tablet (40 mg total) by mouth daily. What changed:    when to take this  reasons to take this   vitamin C 1000 MG tablet Take 1,000 mg by mouth daily.      Follow-up Information    S[place]ier, Iris Pert. Schedule an appointment as soon as possible for a visit in 1 week(s).   Specialties:  Internal Medicine, Family Medicine Contact information: [address] [place] [address] [phone]          Allergies  Allergen Reactions  . Dust Mite Extract Shortness Of Breath  . Levaquin [Levofloxacin In D5w] Shortness Of Breath  . Lidex [Fluocinonide] Swelling    Caused lip swelling  . Molds & Smuts Shortness Of Breath  . Rifampin Shortness Of Breath  . Lactose Other (See Comments)    Unknown rxn per pt  . Penicillins Swelling    Did it involve swelling of the face/tongue/throat, SOB, or low BP? No Did it involve sudden or severe rash/hives, skin peeling, or any reaction on the inside of your mouth or nose? Unknown Did you need to seek medical attention at a hospital or doctor's office? Unknown When did it last happen?Unknown If all above answers are "NO", may proceed with cephalosporin use.     Consultations:  None.    Procedures/Studies: Dg Chest 2 View  Result Date: 01/02/2019 CLINICAL DATA:  Cough and not feeling well. EXAM: CHEST - 2 VIEW COMPARISON:  Chest x-ray and chest CT 12/23/2018 FINDINGS: The heart is enlarged but stable. Stable tortuosity and calcification of the thoracic aorta. The pacer wires are stable. Stable emphysematous changes and pulmonary scarring. Persistent lingular densities. No new/acute findings. No pleural effusions. The  bony thorax appears stable. Remote midthoracic compression deformities. IMPRESSION: Chronic emphysematous changes and chronic lingular densities. No pleural effusion or pneumothorax. Electronically Signed   By: Marijo Sanes M.D.   On: 01/02/2019 16:31   Dg Chest 2 View  Result Date: 12/23/2018 CLINICAL DATA:  New onset left arm and left chest pain with shortness of breath EXAM: CHEST - 2 VIEW COMPARISON:  12/15/2018 FINDINGS: Chronic cardiomegaly and aortic tortuosity. Dual-chamber pacer leads from the left. In the area of suspected pneumonia prior study there is a EKG lead today. No clear consolidation. Trace pleural fluid on both sides versus atelectasis. Diaphragm flattening correlating with history of COPD. Thoracic kyphosis from multilevel compression fracture. IMPRESSION: 1. No definite acute disease. 2. COPD and cardiomegaly. Electronically Signed   By: Monte Fantasia M.D.   On: 12/23/2018 07:44   Dg Chest  2 View  Result Date: 12/15/2018 CLINICAL DATA:  Cough for 10 days, COPD, pacemaker, history COPD, sarcoidosis, cardiomyopathy, former smoker EXAM: CHEST - 2 VIEW COMPARISON:  12/06/2018 FINDINGS: LEFT subclavian transvenous pacemaker leads project at RIGHT atrium and RIGHT ventricle unchanged. Enlargement of cardiac silhouette. Atherosclerotic calcification aorta. Mediastinal contours and pulmonary vascularity normal. Emphysematous and minimal bronchitic changes consistent with COPD. Probable small calcified RIGHT hilar lymph nodes. Chronic streaky atelectasis LEFT base. New LEFT mid lung opacity since 12/06/2018 question pneumonia. Bibasilar atelectasis. No pneumothorax. Osseous demineralization with multiple thoracic compression fractures and accentuated thoracic kyphosis. IMPRESSION: COPD changes with bibasilar atelectasis and suspect developing LEFT perihilar pneumonia. Electronically Signed   By: Lavonia Dana M.D.   On: 12/15/2018 16:27   Dg Shoulder 1v Left  Result Date: 12/23/2018 CLINICAL  DATA:  Shortness of breath.  Left arm pain. EXAM: LEFT SHOULDER - 1 VIEW COMPARISON:  CT 12/23/2017.  Chest x-ray 12/23/2017. FINDINGS: Diffuse osteopenia. Acromioclavicular and glenohumeral degenerative change. No acute bony abnormality. Cardiac pacer noted. Left base atelectasis/infiltrate and small left pleural effusion. IMPRESSION: 1. Diffuse osteopenia. Acromioclavicular glenohumeral degenerative change. No acute abnormality. 2. Left base atelectasis/infiltrate and small left pleural effusion. 3.  Cardiac pacer noted. Electronically Signed   By: Marcello Moores  Register   On: 12/23/2018 14:03   Dg Abd 1 View  Result Date: 01/04/2019 CLINICAL DATA:  Bloated feeling. EXAM: ABDOMEN - 1 VIEW COMPARISON:  None. FINDINGS: No dilated loops of large or small bowel. Gas and stool in the rectum. Normal stool volume. Significant degenerate change of the lumbar spine with levoscoliosis. IMPRESSION: No acute findings in the abdomen. Degenerate changes of the lumbar spine. Electronically Signed   By: Suzy Bouchard M.D.   On: 01/04/2019 18:05   Dg Abd 1 View  Result Date: 01/02/2019 CLINICAL DATA:  Abdominal distension. EXAM: ABDOMEN - 1 VIEW COMPARISON:  12/23/2018 FINDINGS: Colonic stool and scattered bowel gas are noted. No dilated loops of bowel suggestive of obstruction are evident, and the overall appearance is similar to the recent prior study except for mildly increased colonic stool. Evaluation for intraperitoneal free air is limited on this supine study. Small calcifications in the pelvis on the right are unchanged and may represent phleboliths. There is moderate thoracolumbar levoscoliosis. IMPRESSION: No evidence of bowel obstruction. Electronically Signed   By: Logan Bores M.D.   On: 01/02/2019 20:49   Dg Abd 1 View  Result Date: 12/23/2018 CLINICAL DATA:  Abdominal pain and constipation. EXAM: ABDOMEN - 1 VIEW COMPARISON:  Abdomen pelvis CT dated 04/07/2018. Chest CTA obtained earlier today. FINDINGS:  Normal bowel gas pattern with a paucity of intestinal gas. No significant stool visualized. Calcific density overlying the left pelvis with an appearance suggesting an image artifact. Moderate levoconvex thoracolumbar rotary scoliosis. Previously described chronic opacity in the left lung. Enlarged heart and pacemaker leads. IMPRESSION: No acute abnormality. Electronically Signed   By: Claudie Revering M.D.   On: 12/23/2018 18:11   Ct Head Wo Contrast  Result Date: 12/24/2018 CLINICAL DATA:  Altered mental status.  Double vision.  Confusion. EXAM: CT HEAD WITHOUT CONTRAST TECHNIQUE: Contiguous axial images were obtained from the base of the skull through the vertex without intravenous contrast. COMPARISON:  12/06/2018 and multiple previous FINDINGS: Brain: No sign of acute or subacute infarction. The brainstem and cerebellum are normal. Cerebral hemispheres show age related volume loss with mild chronic small-vessel change of the white matter. No large vessel territory infarction. No mass lesion, hemorrhage, hydrocephalus or extra-axial  collection. Vascular: There is atherosclerotic calcification of the major vessels at the base of the brain. Skull: Negative Sinuses/Orbits: Clear/normal Other: None IMPRESSION: No acute finding by CT. Mild age related volume loss and minimal small vessel change of the hemispheric white matter. Electronically Signed   By: Nelson Chimes M.D.   On: 12/24/2018 12:29   Ct Angio Chest Pe W And/or Wo Contrast  Result Date: 01/02/2019 CLINICAL DATA:  Cough, history of sarcoid, CHF and rhinitis EXAM: CT ANGIOGRAPHY CHEST WITH CONTRAST TECHNIQUE: Multidetector CT imaging of the chest was performed using the standard protocol during bolus administration of intravenous contrast. Multiplanar CT image reconstructions and MIPs were obtained to evaluate the vascular anatomy. CONTRAST:  180mL ISOVUE-370 IOPAMIDOL (ISOVUE-370) INJECTION 76% COMPARISON:  CXR 01/02/2019 and chest CT 12/23/2018 and  10/12/2016 PET CT FINDINGS: Cardiovascular: The study is of quality for the evaluation of pulmonary embolism. There are no filling defects in the central, lobar, segmental or subsegmental pulmonary artery branches to suggest acute pulmonary embolism. Great vessels are normal in course and caliber with atherosclerosis. Cardiomegaly is noted without pericardial effusion. Dual chamber pacer from left-sided approach with leads in the right atrial appendage and adjacent to the apical right ventricular septum. No significant pericardial fluid/thickening. Mediastinum/Nodes: No discrete thyroid nodules. Unremarkable esophagus. Small hiatal hernia. No pathologically enlarged axillary, mediastinal or hilar lymph nodes. Lungs/Pleura: Chronic triangular opacity in the lingula as seen on the coronal reformats, series 7/50 felt secondary to chronic benign process given stability since 2017. no new pulmonary consolidation, dominant mass, effusion or pneumothorax. Upper abdomen: Unremarkable. Musculoskeletal: Stable kyphoscoliosis and degenerative disc disease. Remote T6 through T10 compression fractures with levoconvex curvature at the thoracolumbar junction. Review of the MIP images confirms the above findings. IMPRESSION: 1. No acute pulmonary embolus. 2. Chronic triangular opacity in the lingula felt to be secondary to chronic benign process given stability since 2017. 3. Stable cardiomegaly. 4. Thoracic kyphosis with multilevel compression fractures from T6 through T10. Aortic Atherosclerosis (ICD10-I70.0). Electronically Signed   By: Ashley Royalty M.D.   On: 01/02/2019 20:56   Ct Angio Chest Pe W And/or Wo Contrast  Result Date: 12/23/2018 CLINICAL DATA:  Pneumonia, unresolved for complicated EXAM: CT ANGIOGRAPHY CHEST WITH CONTRAST TECHNIQUE: Multidetector CT imaging of the chest was performed using the standard protocol during bolus administration of intravenous contrast. Multiplanar CT image reconstructions and MIPs were  obtained to evaluate the vascular anatomy. CONTRAST:  143mL ISOVUE-370 IOPAMIDOL (ISOVUE-370) INJECTION 76% COMPARISON:  Chest x-ray from earlier today.  PET-CT 10/12/2016 FINDINGS: Cardiovascular: Cardiomegaly without pericardial effusion. Dual-chamber pacer from the left with leads at the apical right ventricular septum and right atrial appendage. Satisfactory opacification of the pulmonary arteries. Accounting for intermittent motion there is no evident pulmonary embolism. Mediastinum/Nodes: Negative for adenopathy. There is a small hiatal hernia and likely some fluid within the lower esophagus. Lungs/Pleura: Chronic triangular opacity in the lingula with associated airway collapse. This has been noted since at least 2017 with stable to regressed size, compatible with a benign process. The opacity is again very low-density, with lipoid pneumonia question on prior study. There is bronchomalacia with dynamic central airway collapse/narrowing. Dependent atelectasis. No acute consolidation, edema, or significant pleural fluid. Upper Abdomen: Due to contrast timing and paucity of intra-abdominal fat there is limited assessment. Musculoskeletal: Kyphoscoliosis and degenerative disease. Remote appearing T6, T7, T8, T9, and T10 compression fractures. Apparent defect at the mid sternal body attributed to motion artifact. No step-off on preceding chest x-ray. Review of the  MIP images confirms the above findings. IMPRESSION: 1. Negative for pulmonary embolism or other acute finding. 2. No significant change in chronic lingular consolidation when compared to 2017 PET. Lipoid pneumonia is again considered given the low-density. 3. Tracheobronchomalacia and kyphoscoliosis with mild dependent atelectasis. Electronically Signed   By: Monte Fantasia M.D.   On: 12/23/2018 10:37     Subjective: No new complaints.   Discharge Exam: Vitals:   01/05/19 0739 01/05/19 0936  BP:  113/68  Pulse:  80  Resp:  16  Temp:     SpO2: 91%    Vitals:   01/04/19 2134 01/05/19 0451 01/05/19 0739 01/05/19 0936  BP: 112/65 (!) 112/57  113/68  Pulse: 83 70  80  Resp: 18 18  16   Temp: 98.7 F (37.1 C) 98.3 F (36.8 C)    TempSrc:      SpO2: 100% 98% 91%   Weight:      Height:        General: Pt is alert, awake, not in acute distress Cardiovascular: RRR, S1/S2 +, no rubs, no gallops Respiratory: CTA bilaterally, no wheezing, no rhonchi Abdominal: Soft, NT, ND, bowel sounds + Extremities: no edema, no cyanosis    The results of significant diagnostics from this hospitalization (including imaging, microbiology, ancillary and laboratory) are listed below for reference.     Microbiology: Recent Results (from the past 240 hour(s))  Urine Culture     Status: Abnormal   Collection Time: 01/02/19  8:11 PM  Result Value Ref Range Status   Specimen Description   Final    URINE, CLEAN CATCH Performed at [facility], [place] [address] [place], [place] [address]    Special Requests   Final    NONE Performed at [facility], [place] [address] [place], [place] [address]    Culture >=100,000 COLONIES/mL ESCHERICHIA COLI (A)  Final   Report Status 01/05/2019 FINAL  Final   Organism ID, Bacteria ESCHERICHIA COLI (A)  Final      Susceptibility   Escherichia coli - MIC*    AMPICILLIN >=32 RESISTANT Resistant     CEFAZOLIN <=4 SENSITIVE Sensitive     CEFTRIAXONE <=1 SENSITIVE Sensitive     CIPROFLOXACIN <=0.25 SENSITIVE Sensitive     GENTAMICIN <=1 SENSITIVE Sensitive     IMIPENEM <=0.25 SENSITIVE Sensitive     NITROFURANTOIN <=16 SENSITIVE Sensitive     TRIMETH/SULFA >=320 RESISTANT Resistant     AMPICILLIN/SULBACTAM >=32 RESISTANT Resistant     PIP/TAZO <=4 SENSITIVE Sensitive     Extended ESBL NEGATIVE Sensitive     * >=100,000 COLONIES/mL ESCHERICHIA COLI  MRSA PCR Screening     Status: None   Collection Time: 01/03/19 12:26 AM  Result Value Ref Range Status   MRSA by  PCR NEGATIVE NEGATIVE Final    Comment:        The GeneXpert MRSA Assay (FDA approved for NASAL specimens only), is one component of a comprehensive MRSA colonization surveillance program. It is not intended to diagnose MRSA infection nor to guide or monitor treatment for MRSA infections. Performed at [facility], [place] [address], [place], [place] [address]      Labs: BNP (last 3 results) Recent Labs    10/25/18 0939 12/23/18 0811 01/02/19 1550  BNP 94.6 113.7* 202.5*   Basic Metabolic Panel: Recent Labs  Lab 12/30/18 1409 01/02/19 1550 01/02/19 2116 01/03/19 0224 01/03/19 0940 01/04/19 0529 01/05/19 0519  NA 132* 134*  --  133*  --  130* 131*  K 4.3 3.1*  --  3.3*  --  3.5 3.5  CL 97 97*  --  96*  --  96* 95*  CO2 27 27  --  26  --  25 29  GLUCOSE 97 99  --  114*  --  103* 123*  BUN 15 12  --  9  --  13 12  CREATININE 0.47 0.41*  --  0.48  --  0.46 0.50  CALCIUM 9.1 8.1*  --  8.3*  --  8.1* 8.6*  MG  --   --  1.8 1.7 1.7  --   --   PHOS  --   --   --  2.3*  --   --   --    Liver Function Tests: Recent Labs  Lab 12/30/18 1409 01/03/19 0224  AST 31 30  ALT 44* 36  ALKPHOS 50 43  BILITOT 0.6 1.0  PROT 5.9* 6.0*  ALBUMIN 3.8 3.4*   No results for input(s): LIPASE, AMYLASE in the last 168 hours. No results for input(s): AMMONIA in the last 168 hours. CBC: Recent Labs  Lab 12/30/18 1409 01/02/19 1550 01/03/19 0224 01/04/19 0529 01/05/19 0519  WBC 7.0 10.2 11.7* 7.8 5.8  NEUTROABS 4.6 8.0*  --   --   --   HGB 12.0 11.7* 11.8* 10.8* 11.1*  HCT 35.5* 36.8 36.7 33.6* 34.1*  MCV 86.8 92.0 90.0 90.6 89.0  PLT 269.0 203 231 214 232   Cardiac Enzymes: Recent Labs  Lab 01/02/19 2116 01/03/19 0224 01/03/19 0853  TROPONINI <0.03 <0.03 <0.03   BNP: Invalid input(s): POCBNP CBG: No results for input(s): GLUCAP in the last 168 hours. D-Dimer No results for input(s): DDIMER in the last 72 hours. Hgb A1c No results for input(s):  HGBA1C in the last 72 hours. Lipid Profile No results for input(s): CHOL, HDL, LDLCALC, TRIG, CHOLHDL, LDLDIRECT in the last 72 hours. Thyroid function studies Recent Labs    01/03/19 0224  TSH 5.102*   Anemia work up No results for input(s): VITAMINB12, FOLATE, FERRITIN, TIBC, IRON, RETICCTPCT in the last 72 hours. Urinalysis    Component Value Date/Time   COLORURINE YELLOW 01/02/2019 2011   APPEARANCEUR HAZY (A) 01/02/2019 2011   LABSPEC 1.005 01/02/2019 2011   PHURINE 6.0 01/02/2019 2011   GLUCOSEU NEGATIVE 01/02/2019 2011   HGBUR SMALL (A) 01/02/2019 2011   BILIRUBINUR NEGATIVE 01/02/2019 2011   BILIRUBINUR negative 10/23/2017 [place] 01/02/2019 2011   PROTEINUR NEGATIVE 01/02/2019 2011   UROBILINOGEN 0.2 10/23/2017 1607   NITRITE NEGATIVE 01/02/2019 2011   LEUKOCYTESUR LARGE (A) 01/02/2019 2011   Sepsis Labs Invalid input(s): PROCALCITONIN,  WBC,  LACTICIDVEN Microbiology Recent Results (from the past 240 hour(s))  Urine Culture     Status: Abnormal   Collection Time: 01/02/19  8:11 PM  Result Value Ref Range Status   Specimen Description   Final    URINE, CLEAN CATCH Performed at [facility], [place] [address] [place], [place] [address]    Special Requests   Final    NONE Performed at [facility], [place] [address], [place], [place] [address]    Culture >=100,000 COLONIES/mL ESCHERICHIA COLI (A)  Final   Report Status 01/05/2019 FINAL  Final   Organism ID, Bacteria ESCHERICHIA COLI (A)  Final      Susceptibility   Escherichia coli - MIC*    AMPICILLIN >=32 RESISTANT Resistant  CEFAZOLIN <=4 SENSITIVE Sensitive     CEFTRIAXONE <=1 SENSITIVE Sensitive     CIPROFLOXACIN <=0.25 SENSITIVE Sensitive     GENTAMICIN <=1 SENSITIVE Sensitive     IMIPENEM <=0.25 SENSITIVE Sensitive     NITROFURANTOIN <=16 SENSITIVE Sensitive     TRIMETH/SULFA >=320 RESISTANT Resistant     AMPICILLIN/SULBACTAM >=32 RESISTANT  Resistant     PIP/TAZO <=4 SENSITIVE Sensitive     Extended ESBL NEGATIVE Sensitive     * >=100,000 COLONIES/mL ESCHERICHIA COLI  MRSA PCR Screening     Status: None   Collection Time: 01/03/19 12:26 AM  Result Value Ref Range Status   MRSA by PCR NEGATIVE NEGATIVE Final    Comment:        The GeneXpert MRSA Assay (FDA approved for NASAL specimens only), is one component of a comprehensive MRSA colonization surveillance program. It is not intended to diagnose MRSA infection nor to guide or monitor treatment for MRSA infections. Performed at [facility], [place] [address], [place], [place] [address]      Time coordinating discharge: 35  minutes  SIGNED:   Hosie Poisson, MD  [facility] 01/05/2019, 11:01 AM Pager   If 7PM-7AM, please contact night-coverage [email or web address] Password [email or web address]

## 2019-01-05 NOTE — Telephone Encounter (Signed)
Son is calling to see what happened with his mother Friday- explained she had called and seemed to be very  ill- 911 was called. He wants to know what hospital she is at- informed [facility] Sidle.

## 2019-01-05 NOTE — Care Management Note (Signed)
Case Management Note  Patient Details  Name: Tracey Holt MRN: [id number] Date of Birth: 05/02/1922  Subjective/Objective: From indep liv-[facility]. PT-recc HHC. Ordered for HHRN/PT/aide-[facility] able to accept-spoke to son Randy-agree to [facility] rep karen aware & following for d/c. Son able to provide own transp back to indep liv-[facility].No further CM needs.                  Action/Plan:dc home w/HHC.   Expected Discharge Date:  01/05/19               Expected Discharge Plan:  [place]  In-House Referral:     Discharge planning Services  CM Consult  Post Acute Care Choice:    Choice offered to:  Adult Children  DME Arranged:    DME Agency:     HH Arranged:  RN, PT, Nurse's Aide [place] Agency:  [place]  Status of Service:  Completed, signed off  If discussed at [place] of Stay Meetings, dates discussed:    Additional Comments:  Dessa Phi, RN 01/05/2019, 11:10 AM

## 2019-01-06 ENCOUNTER — Telehealth: Payer: Self-pay | Admitting: *Deleted

## 2019-01-06 ENCOUNTER — Other Ambulatory Visit (HOSPITAL_BASED_OUTPATIENT_CLINIC_OR_DEPARTMENT_OTHER): Payer: Medicare HMO

## 2019-01-06 ENCOUNTER — Other Ambulatory Visit: Payer: Medicare HMO

## 2019-01-06 ENCOUNTER — Other Ambulatory Visit (HOSPITAL_BASED_OUTPATIENT_CLINIC_OR_DEPARTMENT_OTHER): Payer: Self-pay | Admitting: Emergency Medicine

## 2019-01-06 DIAGNOSIS — I700 Atherosclerosis of aorta: Secondary | ICD-10-CM | POA: Diagnosis not present

## 2019-01-06 DIAGNOSIS — J209 Acute bronchitis, unspecified: Secondary | ICD-10-CM | POA: Diagnosis not present

## 2019-01-06 DIAGNOSIS — I110 Hypertensive heart disease with heart failure: Secondary | ICD-10-CM | POA: Diagnosis not present

## 2019-01-06 DIAGNOSIS — D860 Sarcoidosis of lung: Secondary | ICD-10-CM | POA: Diagnosis not present

## 2019-01-06 DIAGNOSIS — I442 Atrioventricular block, complete: Secondary | ICD-10-CM | POA: Diagnosis not present

## 2019-01-06 DIAGNOSIS — I429 Cardiomyopathy, unspecified: Secondary | ICD-10-CM | POA: Diagnosis not present

## 2019-01-06 DIAGNOSIS — I771 Stricture of artery: Secondary | ICD-10-CM | POA: Diagnosis not present

## 2019-01-06 DIAGNOSIS — I5030 Unspecified diastolic (congestive) heart failure: Secondary | ICD-10-CM | POA: Diagnosis not present

## 2019-01-06 DIAGNOSIS — J439 Emphysema, unspecified: Secondary | ICD-10-CM | POA: Diagnosis not present

## 2019-01-06 DIAGNOSIS — N3000 Acute cystitis without hematuria: Secondary | ICD-10-CM | POA: Diagnosis not present

## 2019-01-06 LAB — T3, FREE: T3, Free: 2.3 pg/mL (ref 2.0–4.4)

## 2019-01-06 NOTE — Telephone Encounter (Signed)
Left voicemail for pt to call office to schedule hospital follow up

## 2019-01-07 ENCOUNTER — Telehealth: Payer: Self-pay | Admitting: Medical

## 2019-01-07 NOTE — Telephone Encounter (Signed)
We received this in error. This is not a patient of [employer].

## 2019-01-07 NOTE — Telephone Encounter (Signed)
Copied from [place] [id number]. Topic: Quick Communication - Home Health Verbal Orders >> Jan 07, 2019 10:49 AM Berneta Levins wrote: Caller/Agency: Stanton Kidney with [facility] Number: [phone], OK to leave a message Requesting OT/PT/Skilled Nursing/Social Work: nursing Frequency: 2 week 2, 1 week 2, 2 PRN

## 2019-01-08 ENCOUNTER — Telehealth: Payer: Self-pay | Admitting: *Deleted

## 2019-01-08 NOTE — Telephone Encounter (Signed)
Received Physician Orders from [facility]; forwarded to supervising provider/SLS 02/20

## 2019-01-09 NOTE — Telephone Encounter (Signed)
Left message for pt to call office

## 2019-01-12 ENCOUNTER — Telehealth: Payer: Self-pay | Admitting: Medical

## 2019-01-12 NOTE — Telephone Encounter (Signed)
Copied from [place] [id number]. Topic: Quick Communication - Home Health Verbal Orders >> Jan 12, 2019 10:53 AM Berneta Levins wrote: Caller/Agency: Tommi Rumps with [facility] Number: [phone], OK to leave a message Requesting OT/PT/Skilled Nursing/Social Work: nursing Frequency: 1 week 1, 2 week 1, then 1 week 1

## 2019-01-12 NOTE — Telephone Encounter (Signed)
Will you call [facility] and give verbal authorization for requested services.

## 2019-01-13 DIAGNOSIS — I442 Atrioventricular block, complete: Secondary | ICD-10-CM | POA: Diagnosis not present

## 2019-01-13 DIAGNOSIS — J209 Acute bronchitis, unspecified: Secondary | ICD-10-CM | POA: Diagnosis not present

## 2019-01-13 DIAGNOSIS — D860 Sarcoidosis of lung: Secondary | ICD-10-CM | POA: Diagnosis not present

## 2019-01-13 DIAGNOSIS — I110 Hypertensive heart disease with heart failure: Secondary | ICD-10-CM | POA: Diagnosis not present

## 2019-01-13 DIAGNOSIS — N3000 Acute cystitis without hematuria: Secondary | ICD-10-CM | POA: Diagnosis not present

## 2019-01-13 DIAGNOSIS — I429 Cardiomyopathy, unspecified: Secondary | ICD-10-CM | POA: Diagnosis not present

## 2019-01-13 DIAGNOSIS — J439 Emphysema, unspecified: Secondary | ICD-10-CM | POA: Diagnosis not present

## 2019-01-13 DIAGNOSIS — I700 Atherosclerosis of aorta: Secondary | ICD-10-CM | POA: Diagnosis not present

## 2019-01-13 DIAGNOSIS — I771 Stricture of artery: Secondary | ICD-10-CM | POA: Diagnosis not present

## 2019-01-13 DIAGNOSIS — I5030 Unspecified diastolic (congestive) heart failure: Secondary | ICD-10-CM | POA: Diagnosis not present

## 2019-01-13 NOTE — Telephone Encounter (Signed)
Left Tommi Rumps a message to giving verbal orders.

## 2019-01-19 ENCOUNTER — Telehealth: Payer: Self-pay

## 2019-01-19 NOTE — Telephone Encounter (Signed)
Copied from [place] [id number]. Topic: Appointment Scheduling - Scheduling Inquiry for Clinic >> Jan 16, 2019 12:11 PM Vernona Rieger wrote: Reason for CRM: Patient would like to have her sodium checked because once before it was low and had to go to the hospital. She does not want it to get low again. Please Advise.

## 2019-01-20 ENCOUNTER — Telehealth: Payer: Self-pay | Admitting: *Deleted

## 2019-01-20 ENCOUNTER — Telehealth: Payer: Self-pay | Admitting: Medical

## 2019-01-20 DIAGNOSIS — E871 Hypo-osmolality and hyponatremia: Secondary | ICD-10-CM

## 2019-01-20 NOTE — Telephone Encounter (Signed)
Received Home Health Certification and Plan of Care; forwarded to provider/SLS 03/03

## 2019-01-20 NOTE — Telephone Encounter (Signed)
Placed future lab for cmp. Please get her scheduled to repeat metabolic panel.

## 2019-01-21 NOTE — Telephone Encounter (Signed)
Tried to reach pt phone was disconnected. PT needs to schedule lab appointment. Oka for [facility] to give information

## 2019-01-21 NOTE — Telephone Encounter (Signed)
Left VM for patient to call back and schedule her lab appointment.

## 2019-01-23 ENCOUNTER — Other Ambulatory Visit: Payer: Self-pay | Admitting: Medical

## 2019-01-23 NOTE — Telephone Encounter (Signed)
Copied from [place] [id number]. Topic: Quick Communication - Rx Refill/Question >> Jan 23, 2019  3:30 PM Gustavus Messing wrote: Medication: atorvastatin (LIPITOR) 10 MG tablet   Has the patient contacted their pharmacy? Yes.   (Agent: If no, request that the patient contact the pharmacy for the refill.) (Agent: If yes, when and what did the pharmacy advise?) Pharmacy told patient to call Dr office even though she has 3 refills.  Preferred Pharmacy (with phone number or street name): [id number] [address], [place] [phone] (Phone) [phone] (Fax)    Agent: Please be advised that RX refills may take up to 3 business days. We ask that you follow-up with your pharmacy.

## 2019-01-27 MED ORDER — ATORVASTATIN CALCIUM 10 MG PO TABS: ORAL_TABLET | ORAL | 3 refills | Status: DC

## 2019-02-02 ENCOUNTER — Telehealth: Payer: Self-pay | Admitting: *Deleted

## 2019-02-02 NOTE — Telephone Encounter (Signed)
Received Home Health Certification and Plan of Care; forwarded to provider/SLS 03/16

## 2019-02-26 ENCOUNTER — Other Ambulatory Visit: Payer: Self-pay | Admitting: Medical

## 2019-03-02 MED ORDER — ATORVASTATIN CALCIUM 10 MG PO TABS: ORAL_TABLET | ORAL | 3 refills | Status: DC

## 2019-03-08 IMAGING — US US EXTREM  UP VENOUS*R*
1 series · 14 of 24 positions shown · non-contrast
Comparison: None.

CLINICAL DATA: Right forearm pain.  Right upper arm pain.

EXAM:
RIGHT UPPER EXTREMITY VENOUS DUPLEX ULTRASOUND
TECHNIQUE: Doppler venous assessment of the right upper extremity deep venous
system was performed, including characterization of spectral flow,
compressibility, and phasicity.

[Series 1: us extrem up venous*right* · 14 of 36 slices shown]
[im 1/36]
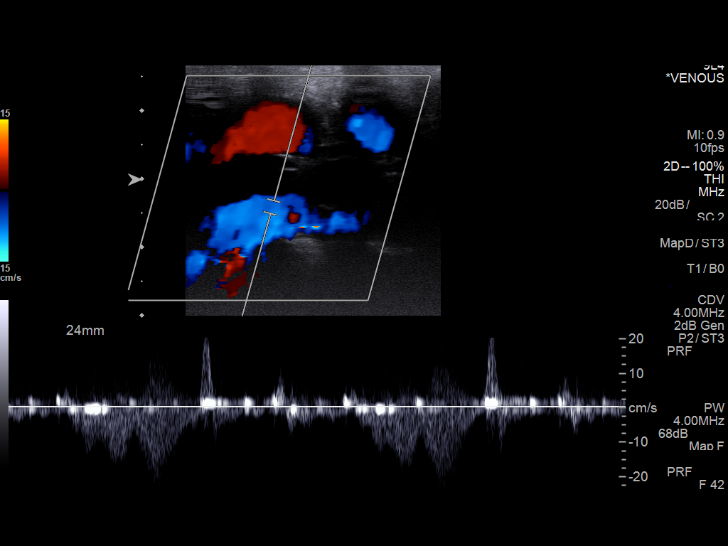
[im 4/36]
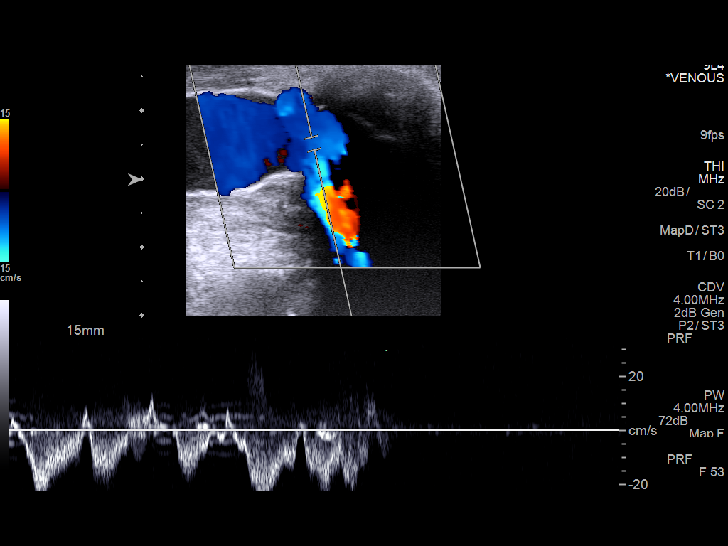
[im 7/36]
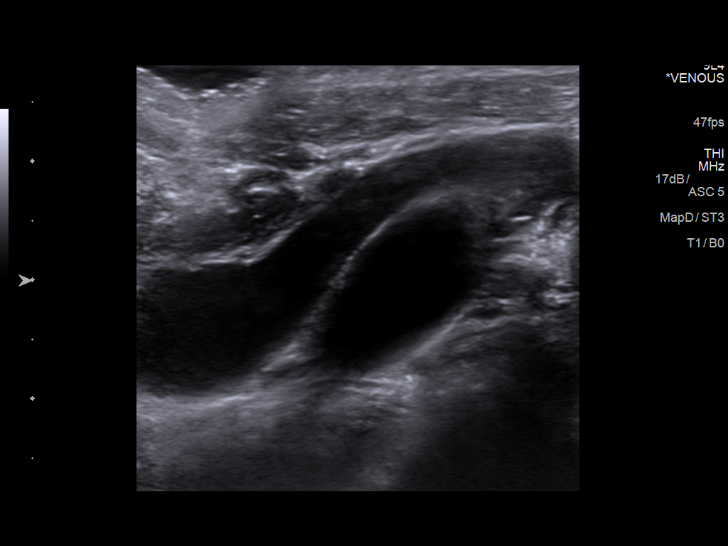
[im 10/36]
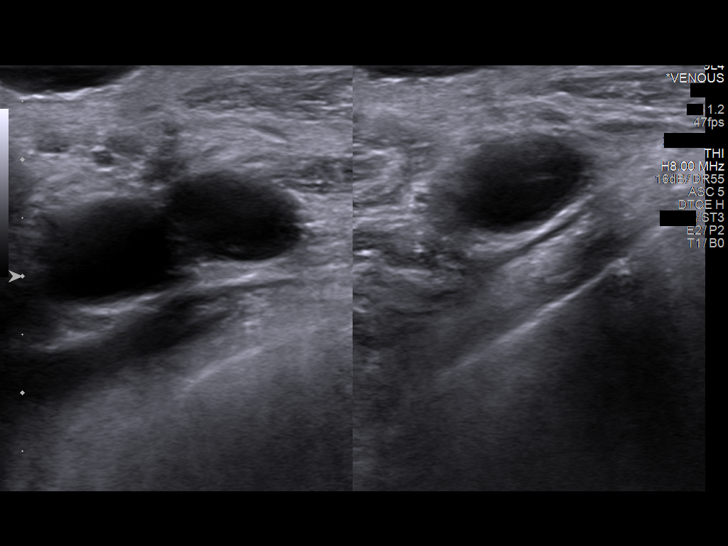
[im 11/36]
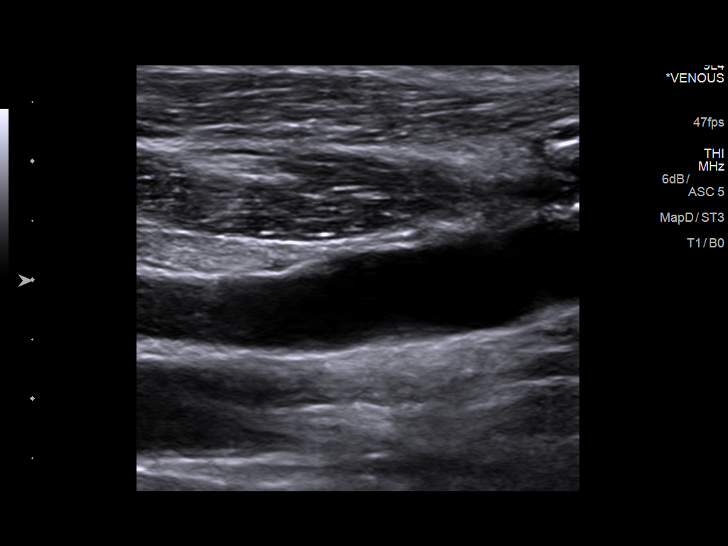
[im 14/36]
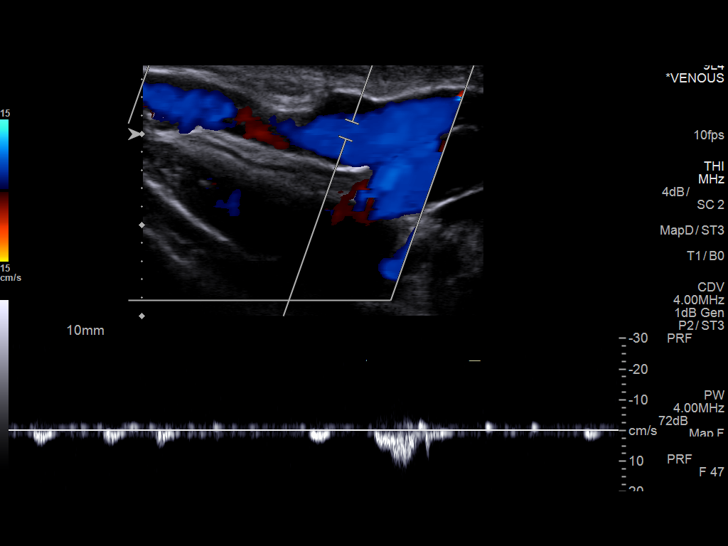
[im 17/36]
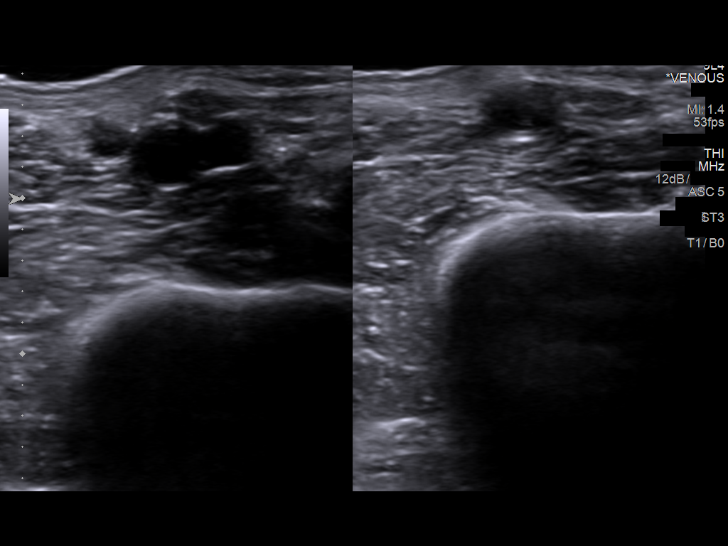
[im 19/36]
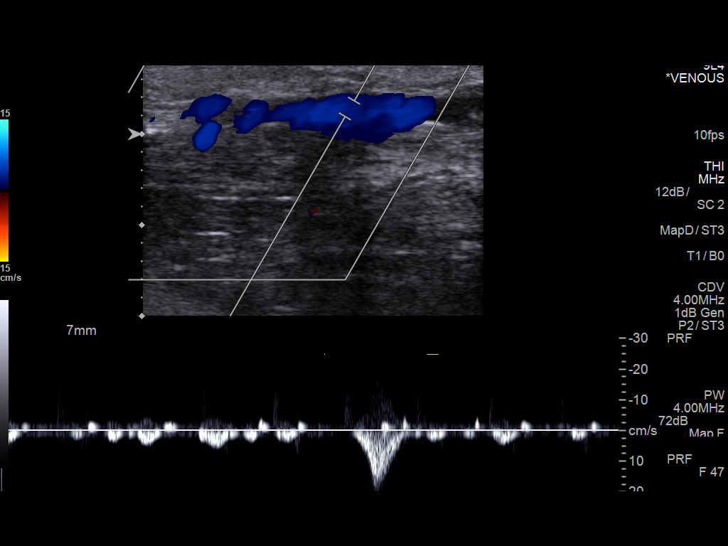
[im 22/36]
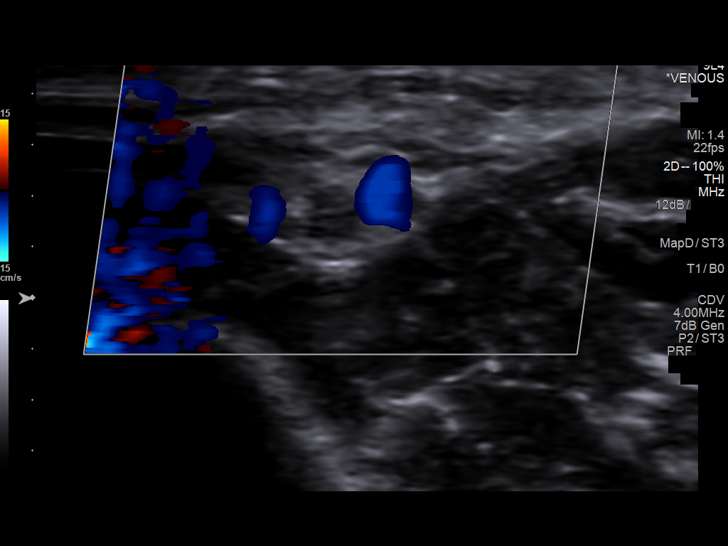
[im 25/36]
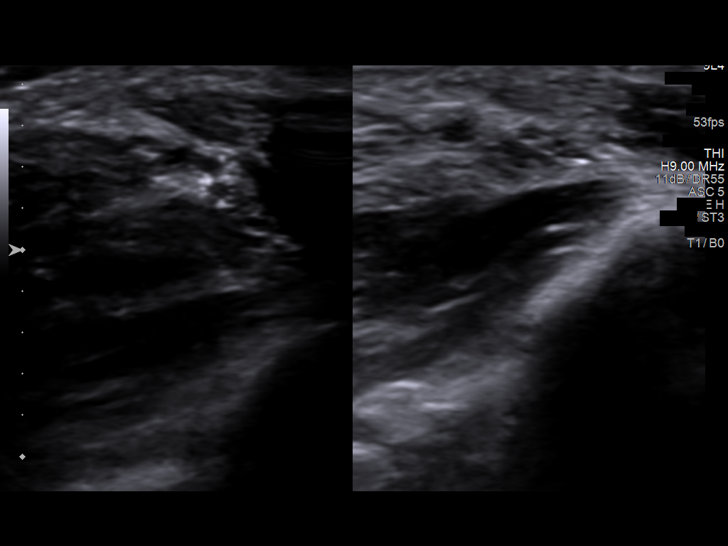
[im 28/36]
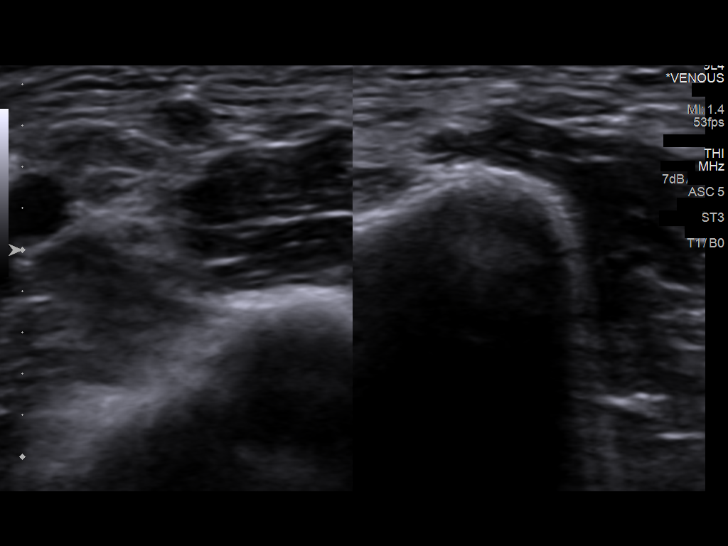
[im 29/36]
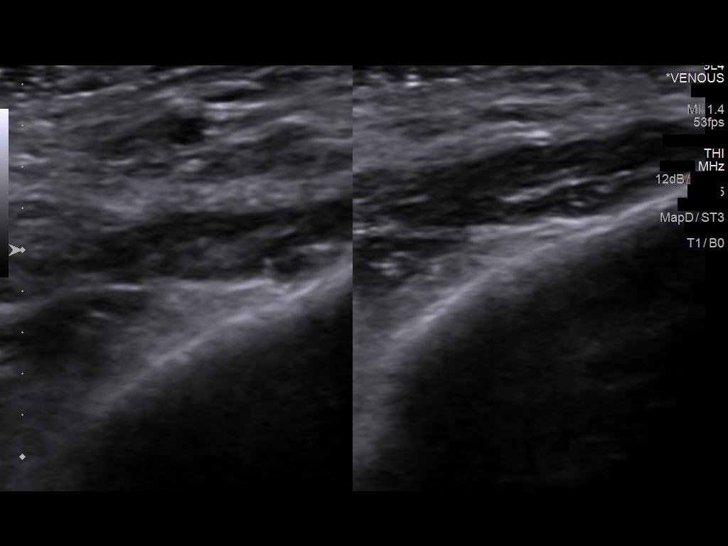
[im 32/36]
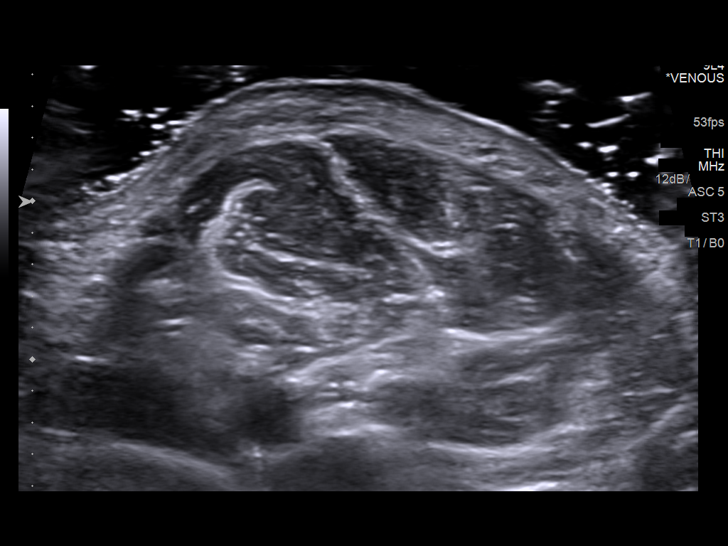
[im 36/36]
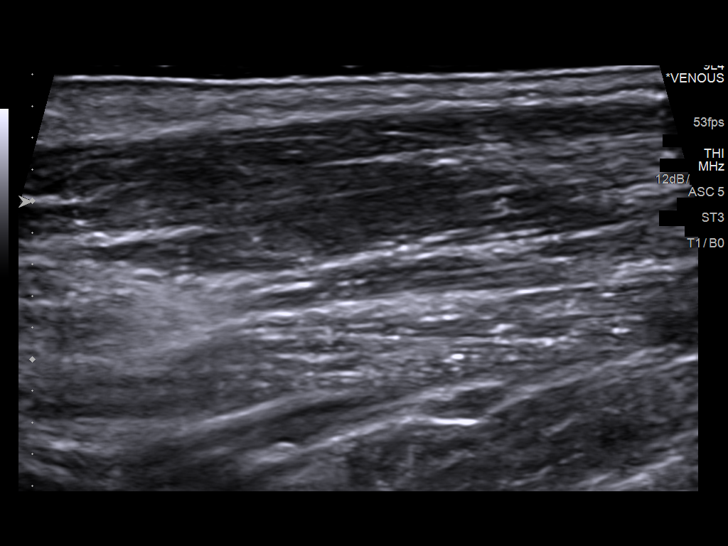

[14 of 24 positions shown; findings below may reference images not displayed]

FINDINGS: There is complete compressibility of the right jugular, subclavian,
axillary, brachial, radial, and ulnar veins. Doppler analysis
demonstrates augmentation and a normal-appearing venous waveform.
There is no evidence of superficial vein thrombosis in the right
upper extremity.
IMPRESSION: No evidence of DVT or superficial venous thrombosis in the right
upper extremity.

## 2019-03-18 ENCOUNTER — Other Ambulatory Visit: Payer: Self-pay

## 2019-03-18 ENCOUNTER — Ambulatory Visit (INDEPENDENT_AMBULATORY_CARE_PROVIDER_SITE_OTHER): Payer: Medicare HMO | Admitting: *Deleted

## 2019-03-18 DIAGNOSIS — I442 Atrioventricular block, complete: Secondary | ICD-10-CM | POA: Diagnosis not present

## 2019-03-18 DIAGNOSIS — I5022 Chronic systolic (congestive) heart failure: Secondary | ICD-10-CM

## 2019-03-18 LAB — CUP PACEART REMOTE DEVICE CHECK
Battery Remaining Longevity: 96 mo
Battery Remaining Percentage: 95.5 %
Battery Voltage: 2.98 V
Brady Statistic AP VP Percent: 38 %
Brady Statistic AP VS Percent: 1 %
Brady Statistic AS VP Percent: 62 %
Brady Statistic AS VS Percent: 1 %
Brady Statistic RA Percent Paced: 38 %
Brady Statistic RV Percent Paced: 99 %
Date Time Interrogation Session: 20200429060015
Implantable Lead Implant Date: 20170907
Implantable Lead Implant Date: 20170907
Implantable Lead Location: 753859
Implantable Lead Location: 753860
Implantable Pulse Generator Implant Date: 20170907
Lead Channel Impedance Value: 400 Ohm
Lead Channel Impedance Value: 430 Ohm
Lead Channel Pacing Threshold Amplitude: 0.5 V
Lead Channel Pacing Threshold Amplitude: 1.375 V
Lead Channel Pacing Threshold Pulse Width: 0.4 ms
Lead Channel Pacing Threshold Pulse Width: 0.4 ms
Lead Channel Sensing Intrinsic Amplitude: 3.2 mV
Lead Channel Sensing Intrinsic Amplitude: 7.3 mV
Lead Channel Setting Pacing Amplitude: 2.5 V
Lead Channel Setting Pacing Amplitude: 2.5 V
Lead Channel Setting Pacing Pulse Width: 0.4 ms
Lead Channel Setting Sensing Sensitivity: 2 mV
Pulse Gen Model: 2272
Pulse Gen Serial Number: 7944486

## 2019-03-27 NOTE — Progress Notes (Signed)
Remote pacemaker transmission.   

## 2019-04-19 ENCOUNTER — Other Ambulatory Visit: Payer: Self-pay | Admitting: Medical

## 2019-05-20 ENCOUNTER — Encounter: Payer: Self-pay | Admitting: Medical

## 2019-05-20 ENCOUNTER — Ambulatory Visit (INDEPENDENT_AMBULATORY_CARE_PROVIDER_SITE_OTHER): Payer: Medicare HMO | Admitting: Medical

## 2019-05-20 ENCOUNTER — Other Ambulatory Visit: Payer: Self-pay

## 2019-05-20 DIAGNOSIS — K5900 Constipation, unspecified: Secondary | ICD-10-CM

## 2019-05-20 DIAGNOSIS — R51 Headache: Secondary | ICD-10-CM | POA: Diagnosis not present

## 2019-05-20 DIAGNOSIS — R143 Flatulence: Secondary | ICD-10-CM | POA: Diagnosis not present

## 2019-05-20 DIAGNOSIS — E860 Dehydration: Secondary | ICD-10-CM

## 2019-05-20 DIAGNOSIS — R519 Headache, unspecified: Secondary | ICD-10-CM

## 2019-05-20 NOTE — Progress Notes (Signed)
Subjective:    Patient ID: Tracey Holt, female    DOB: [birth year], 85 y.o.   MRN: [id number]  HPI  Virtual Visit via Telephone Note  I connected with Tracey Holt on 05/20/19 at  1:20 PM EDT by telephone and verified that I am speaking with the correct person using two identifiers.  Location: Patient: home Provider: office  Pt had her bp and pulse checked today at her home. She gets her bp checked various  times. But person who checked her bp not there. She will get it checked tomorrow morning.   I discussed the limitations, risks, security and privacy concerns of performing an evaluation and management service by telephone and the availability of in person appointments. I also discussed with the patient that there may be a patient responsible charge related to this service. The patient expressed understanding and agreed to proceed.   History of Present Illness: Every now and then will get mild ha. States last week ha was on and off transient and mild. Today started to get mild ha and she took tylenol with ha resolved quickly. Faint mild stuffy nose. But she has that in past. Pt states in her room most of the time by herself. No fever, no chills or sweats. Pt not had any sneezing.   Pt also states feels mild bloated at times. She took gas-x and it helps.She states mild bloated for about a week. Pt states she feels constipated at times. Pt has been used glycerin suppository. Pt thinks not drinking enough might be contributing.   Observations/Objective: General- no acute distress. Pleasant, oriented. Speech normal. heent- pt has no pain on frontal area when she self palpates. Abd-she feels mild bloated. But no pain reports.   Assessment and Plan: Patient has intermittent mild transient low-level headache with no describe neurologic type signs or symptoms.  She does not know her blood pressure reading today although it was checked and no comment from staff regarding if it was high.  I  asked patient to make sure they check her blood pressure reading tomorrow morning and then call the office so we can have updated our reading.  Presently headache subsided quickly with just 1 Tylenol earlier today.  Patient has no sinus pressure on self exam.  She does have's a history of some mild allergic rhinitis type symptoms but none recently.  We will see how she does but if she does get recurrent intermittent pressure then would probably prescribe Flonase.  Patient also notes she is not drinking a whole lot of water recently.  So discussed with her that there is possibility that she might be getting some mild headaches related to dehydration.  Asked her to increase her water intake and she does have Gatorade as 0 available so she is going to drink some of that as well.  For mild intermittent low level constipation and gassy sensation, I advised patient she could use Gas-X and she could also use Dulcolax every second or third day if no significant bowel movement occurring.  If she has any severe constipation or abdomen pain let us know.  Also explained to patient that being dehydrated can contribute to constipation.  Follow-up in 7 to 10 days or as needed.  Follow Up Instructions:    I discussed the assessment and treatment plan with the patient. The patient was provided an opportunity to ask questions and all were answered. The patient agreed with the plan and demonstrated an understanding of the instructions.  The patient was advised to call back or seek an in-person evaluation if the symptoms worsen or if the condition fails to improve as anticipated.  I provided 15 minutes of non-face-to-face time during this encounter.   Mackie Pai, PA-C    Review of Systems  Constitutional: Negative for chills, fatigue and fever.  HENT: Negative for congestion, ear pain, nosebleeds, sinus pressure, sneezing and trouble swallowing.   Respiratory: Negative for cough, choking and chest  tightness.   Cardiovascular: Negative for chest pain and palpitations.  Gastrointestinal: Positive for abdominal distention. Negative for abdominal pain, constipation, nausea and vomiting.       Gasy sensation with mild constipation.  Genitourinary: Negative for difficulty urinating and dysuria.  Musculoskeletal: Negative for back pain and myalgias.  Neurological: Negative for dizziness, seizures, weakness and light-headedness.       See hpi. Lamonte Sakai basically gone now.  Hematological: Negative for adenopathy. Does not bruise/bleed easily.  Psychiatric/Behavioral: Negative for behavioral problems, confusion and suicidal ideas. The patient is not nervous/anxious.     Past Medical History:  Diagnosis Date  . Bradycardic cardiac arrest ([place]) 07/24/2016   Required CPR, intubation with multiple rib fractures. Had short term cardiac shock with acute heart failure.  Resulted in possible stress-induced cardiomyopathy  . Cardiac related syncope 07/24/2016   Bradycardic arrest requiring CPR 2 with transvenous pacemaker placed followed by permanent pacemaker; complicated by respiratory arrest, type II MI, stress-induced cardiopathy  . Cardiomyopathy ([place]) 07/2016   Moderate severely reduced EF with apical hypokinesis suggestive of stress-induced cardiac myopathy versus multivessel CAD --> in setting of her to cardiac arrest  . COPD (chronic obstructive pulmonary disease) ([place])    By report  . History of chronic constipation   . Hypercholesterolemia   . Osteoporosis   . Pacemaker 07/26/2016   Abbott dual-chamber pacemaker: [place] #[id number] - Serial [id number]  . Pneumonia 08/2016  . Sarcoidosis of lung ([place])   . Spinal stenosis      Social History   Socioeconomic History  . Marital status: Widowed    Spouse name: Not on file  . Number of children: Not on file  . Years of education: Not on file  . Highest education level: Not on file  Occupational History  . Not on file  Social  Needs  . Financial resource strain: Not on file  . Food insecurity    Worry: Not on file    Inability: Not on file  . Transportation needs    Medical: Not on file    Non-medical: Not on file  Tobacco Use  . Smoking status: Former Smoker    Packs/day: 1.50    Years: 17.00    Pack years: 25.50    Types: Cigarettes    Start date: 12/18/1963    Quit date: 11/20/1983    Years since quitting: 35.5  . Smokeless tobacco: Never Used  Substance and Sexual Activity  . Alcohol use: No  . Drug use: No  . Sexual activity: Not on file  Lifestyle  . Physical activity    Days per week: Not on file    Minutes per session: Not on file  . Stress: Not on file  Relationships  . Social Herbalist on phone: Not on file    Gets together: Not on file    Attends religious service: Not on file    Active member of club or organization: Not on file    Attends meetings of clubs or organizations: Not  on file    Relationship status: Not on file  . Intimate partner violence    Fear of current or ex partner: Not on file    Emotionally abused: Not on file    Physically abused: Not on file    Forced sexual activity: Not on file  Other Topics Concern  . Not on file  Social History Narrative   Recently moved to [place] from [place] to live near her daughter.    Past Surgical History:  Procedure Laterality Date  . ADENOIDECTOMY    . PACEMAKER INSERTION Left 07/26/2016   Abbott Assurity Model #[id number] - Serial #[id number] -- complicated by pneumothorax requiring chest tube placement  . TEE WITHOUT CARDIOVERSION  02/2013   EF 55-60% with normal global function. No thrombus, mass or vegetation. Trivial-small pericardial effusion. Moderate LA dilation. Mild MR.  . TONSILLECTOMY    . TRANSTHORACIC ECHOCARDIOGRAM  07/24/2016   [facility]: EF 45-50%. Mid and apical inferior, inferolateral and apical hypokinesis. GR 2 DD. Mild LA and moderate RA dilation. Mild  paracardial effusion. Moderate to severe TR. Basal and mid segment hypokinesis with apical hypokinesis. - Suspect stress-induced cardiopathy versus multivessel CAD.  Marland Kitchen TRANSTHORACIC ECHOCARDIOGRAM  07/26/2016   [facility]: LIMITED (post PPM) - although the report suggested EF 30-35% with apical akinesis, rounding note suggested this was an improvement    Family History  Problem Relation Age of Onset  . Allergic rhinitis Neg Hx   . Angioedema Neg Hx   . Asthma Neg Hx   . Eczema Neg Hx   . Immunodeficiency Neg Hx   . Urticaria Neg Hx     Allergies  Allergen Reactions  . Dust Mite Extract Shortness Of Breath  . Levaquin [Levofloxacin In D5w] Shortness Of Breath  . Lidex [Fluocinonide] Swelling    Caused lip swelling  . Molds & Smuts Shortness Of Breath  . Rifampin Shortness Of Breath  . Lactose Other (See Comments)    Unknown rxn per pt  . Penicillins Swelling    Did it involve swelling of the face/tongue/throat, SOB, or low BP? No Did it involve sudden or severe rash/hives, skin peeling, or any reaction on the inside of your mouth or nose? Unknown Did you need to seek medical attention at a hospital or doctor's office? Unknown When did it last happen?Unknown If all above answers are "NO", may proceed with cephalosporin use.     Current Outpatient Medications on File Prior to Visit  Medication Sig Dispense Refill  . albuterol (PROVENTIL HFA;VENTOLIN HFA) 108 (90 Base) MCG/ACT inhaler Inhale 2 puffs into the lungs every 6 (six) hours as needed for wheezing or shortness of breath. (Patient not taking: Reported on 01/02/2019) 1 Inhaler 2  . amLODipine (NORVASC) 2.5 MG tablet TAKE 1 TABLET BY MOUTH EVERY DAY 90 tablet 1  . Ascorbic Acid (VITAMIN C) 1000 MG tablet Take 1,000 mg by mouth daily.    Marland Kitchen atorvastatin (LIPITOR) 10 MG tablet Take 1/2 tablet (5mg ) by mouth daily 45 tablet 3  . benzonatate (TESSALON) 100 MG capsule Take 1 capsule (100 mg total) by mouth  3 (three) times daily as needed for cough. 30 capsule 0  . Biotin 10000 MCG TABS Take 10,000 mcg by mouth daily.     . cephALEXin (KEFLEX) 500 MG capsule Take 1 capsule (500 mg total) by mouth 2 (two) times daily. 8 capsule 0  . docusate sodium (COLACE) 100 MG capsule Take 100 mg by mouth daily  as needed for mild constipation.     Marland Kitchen guaiFENesin-dextromethorphan (ROBITUSSIN DM) 100-10 MG/5ML syrup Take 5 mLs by mouth every 4 (four) hours as needed (chest congestion). 118 mL 0  . hydroxypropyl methylcellulose / hypromellose (ISOPTO TEARS / GONIOVISC) 2.5 % ophthalmic solution Place 1 drop into both eyes daily.     Marland Kitchen ipratropium (ATROVENT HFA) 17 MCG/ACT inhaler Inhale 2 puffs into the lungs every 6 (six) hours as needed for wheezing. (Patient not taking: Reported on 01/02/2019) 1 Inhaler 2  . levocetirizine (XYZAL) 5 MG tablet Take 5 mg by mouth every evening.    . Multiple Vitamins-Minerals (MULTIVITAMIN WITH MINERALS) tablet Take 1 tablet by mouth daily.    . pantoprazole (PROTONIX) 40 MG tablet Take 1 tablet (40 mg total) by mouth daily. (Patient taking differently: Take 40 mg by mouth daily as needed (heartburn). ) 30 tablet 1   No current facility-administered medications on file prior to visit.     There were no vitals taken for this visit.      Objective:   Physical Exam        Assessment & Plan:

## 2019-05-20 NOTE — Patient Instructions (Signed)
Patient has intermittent mild transient low-level headache with no describe neurologic type signs or symptoms.  She does not know her blood pressure reading today although it was checked and no comment from staff regarding if it was high.  I asked patient to make sure they check her blood pressure reading tomorrow morning and then call the office so we can have updated our reading.  Presently headache subsided quickly with just 1 Tylenol earlier today.  Patient has no sinus pressure on self exam.  She does have's a history of some mild allergic rhinitis type symptoms but none recently.  We will see how she does but if she does get recurrent intermittent pressure then would probably prescribe Flonase.  Patient also notes she is not drinking a whole lot of water recently.  So discussed with her that there is possibility that she might be getting some mild headaches related to dehydration.  Asked her to increase her water intake and she does have Gatorade as 0 available so she is going to drink some of that as well.  For mild intermittent low level constipation and gassy sensation, I advised patient she could use Gas-X and she could also use Dulcolax every second or third day if no significant bowel movement occurring.  If she has any severe constipation or abdomen pain let us know.  Also explained to patient that being dehydrated can contribute to constipation.  Follow-up in 7 to 10 days or as needed.

## 2019-06-17 ENCOUNTER — Ambulatory Visit (INDEPENDENT_AMBULATORY_CARE_PROVIDER_SITE_OTHER): Payer: Medicare HMO | Admitting: *Deleted

## 2019-06-17 DIAGNOSIS — I442 Atrioventricular block, complete: Secondary | ICD-10-CM

## 2019-06-17 DIAGNOSIS — I462 Cardiac arrest due to underlying cardiac condition: Secondary | ICD-10-CM

## 2019-06-17 DIAGNOSIS — R001 Bradycardia, unspecified: Secondary | ICD-10-CM

## 2019-06-17 LAB — CUP PACEART REMOTE DEVICE CHECK
Battery Remaining Longevity: 90 mo
Battery Remaining Percentage: 95.5 %
Battery Voltage: 2.96 V
Brady Statistic AP VP Percent: 39 %
Brady Statistic AP VS Percent: 1 %
Brady Statistic AS VP Percent: 61 %
Brady Statistic AS VS Percent: 1 %
Brady Statistic RA Percent Paced: 38 %
Brady Statistic RV Percent Paced: 99 %
Date Time Interrogation Session: 20200729060017
Implantable Lead Implant Date: 20170907
Implantable Lead Implant Date: 20170907
Implantable Lead Location: 753859
Implantable Lead Location: 753860
Implantable Pulse Generator Implant Date: 20170907
Lead Channel Impedance Value: 390 Ohm
Lead Channel Impedance Value: 390 Ohm
Lead Channel Pacing Threshold Amplitude: 0.5 V
Lead Channel Pacing Threshold Amplitude: 0.75 V
Lead Channel Pacing Threshold Pulse Width: 0.4 ms
Lead Channel Pacing Threshold Pulse Width: 0.4 ms
Lead Channel Sensing Intrinsic Amplitude: 2.5 mV
Lead Channel Sensing Intrinsic Amplitude: 7.3 mV
Lead Channel Setting Pacing Amplitude: 2.5 V
Lead Channel Setting Pacing Amplitude: 2.5 V
Lead Channel Setting Pacing Pulse Width: 0.4 ms
Lead Channel Setting Sensing Sensitivity: 2 mV
Pulse Gen Model: 2272
Pulse Gen Serial Number: 7944486

## 2019-06-26 NOTE — Progress Notes (Signed)
Remote pacemaker transmission.   

## 2019-07-06 DIAGNOSIS — H0102B Squamous blepharitis left eye, upper and lower eyelids: Secondary | ICD-10-CM | POA: Diagnosis not present

## 2019-07-06 DIAGNOSIS — H0102A Squamous blepharitis right eye, upper and lower eyelids: Secondary | ICD-10-CM | POA: Diagnosis not present

## 2019-07-06 DIAGNOSIS — H16223 Keratoconjunctivitis sicca, not specified as Sjogren's, bilateral: Secondary | ICD-10-CM | POA: Diagnosis not present

## 2019-07-06 DIAGNOSIS — Z961 Presence of intraocular lens: Secondary | ICD-10-CM | POA: Diagnosis not present

## 2019-08-18 DIAGNOSIS — R69 Illness, unspecified: Secondary | ICD-10-CM | POA: Diagnosis not present

## 2019-08-26 ENCOUNTER — Telehealth: Payer: Self-pay | Admitting: Medical

## 2019-08-26 NOTE — Telephone Encounter (Signed)
Okay to fill? 

## 2019-08-26 NOTE — Telephone Encounter (Signed)
Patient states she's completely out and would like Rx sent today, informed patient please allow 48 to 72 hour turn around time

## 2019-08-26 NOTE — Telephone Encounter (Signed)
Medication: levocetirizine (XYZAL) 5 MG tablet [id number]   Pt would like to know if another form could be called in so that she can get the medication cheaper. Please advise.   [id number] [address], [place] [phone] (Phone) [phone] (Fax)

## 2019-08-27 ENCOUNTER — Telehealth: Payer: Self-pay | Admitting: Medical

## 2019-08-27 MED ORDER — LEVOCETIRIZINE DIHYDROCHLORIDE 5 MG PO TABS: 5.0000 mg | ORAL_TABLET | Freq: Every evening | ORAL | 2 refills | Status: DC

## 2019-08-27 NOTE — Telephone Encounter (Signed)
Refilled pt xyzal today.

## 2019-08-27 NOTE — Telephone Encounter (Signed)
I refilled xyzal.

## 2019-09-14 ENCOUNTER — Ambulatory Visit: Payer: Self-pay | Admitting: *Deleted

## 2019-09-14 NOTE — Telephone Encounter (Signed)
Pt called with have abd discomfort. States abd is bloated and feels full. She had a bm last night after an enema and it was formed and today she had one just a little loose. She denies blood or rectal pain. She take a laxative and an enema very often and also used a suppository. She denies fever or nausea or vomiting. Advised to drink more water. Per protocol she is advised to go to an urgent care. She voiced understanding. Routing to LB at [facility] for review.  Reason for Disposition . [1] Rectal pain or fullness from fecal impaction (rectum full of stool) AND [2] NOT better after SITZ bath, suppository or enema  Answer Assessment - Initial Assessment Questions 1. STOOL PATTERN OR FREQUENCY: "How often do you pass bowel movements (BMs)?"  (Normal range: tid to q 3 days)  "When was the last BM passed?"       One time a day 2. STRAINING: "Do you have to strain to have a BM?"      Maybe once 3. RECTAL PAIN: "Does your rectum hurt when the stool comes out?" If so, ask: "Do you have hemorrhoids? How bad is the pain?"  (Scale 1-10; or mild, moderate, severe)     no 4. STOOL COMPOSITION: "Are the stools hard?"      formed 5. BLOOD ON STOOLS: "Has there been any blood on the toilet tissue or on the surface of the BM?" If so, ask: "When was the last time?"      no 6. CHRONIC CONSTIPATION: "Is this a new problem for you?"  If no, ask: "How long have you had this problem?" (days, weeks, months)      Not too bad 7. CHANGES IN DIET: "Have there been any recent changes in your diet?"      no 8. MEDICATIONS: "Have you been taking any new medications?"     dont think so 9. LAXATIVES: "Have you been using any laxatives or enemas?"  If yes, ask "What, how often, and when was the last time?"     Laxative and enemas recently 10. CAUSE: "What do you think is causing the constipation?"        Not sure 11. OTHER SYMPTOMS: "Do you have any other symptoms?" (e.g., abdominal pain, fever, vomiting)  Bloated,  12. PREGNANCY: "Is there any chance you are pregnant?" "When was your last menstrual period?"       n/a  Protocols used: CONSTIPATION-A-AH

## 2019-09-15 NOTE — Telephone Encounter (Signed)
Ok. That pt seeds UC.

## 2019-09-17 ENCOUNTER — Ambulatory Visit (INDEPENDENT_AMBULATORY_CARE_PROVIDER_SITE_OTHER): Payer: Medicare HMO | Admitting: *Deleted

## 2019-09-17 DIAGNOSIS — I462 Cardiac arrest due to underlying cardiac condition: Secondary | ICD-10-CM

## 2019-09-17 DIAGNOSIS — I442 Atrioventricular block, complete: Secondary | ICD-10-CM | POA: Diagnosis not present

## 2019-09-17 LAB — CUP PACEART REMOTE DEVICE CHECK
Battery Remaining Longevity: 94 mo
Battery Remaining Percentage: 95.5 %
Battery Voltage: 2.98 V
Brady Statistic AP VP Percent: 39 %
Brady Statistic AP VS Percent: 1 %
Brady Statistic AS VP Percent: 61 %
Brady Statistic AS VS Percent: 1 %
Brady Statistic RA Percent Paced: 39 %
Brady Statistic RV Percent Paced: 99 %
Date Time Interrogation Session: 20201029091704
Implantable Lead Implant Date: 20170907
Implantable Lead Implant Date: 20170907
Implantable Lead Location: 753859
Implantable Lead Location: 753860
Implantable Pulse Generator Implant Date: 20170907
Lead Channel Impedance Value: 350 Ohm
Lead Channel Impedance Value: 390 Ohm
Lead Channel Pacing Threshold Amplitude: 0.5 V
Lead Channel Pacing Threshold Amplitude: 1.5 V
Lead Channel Pacing Threshold Pulse Width: 0.4 ms
Lead Channel Pacing Threshold Pulse Width: 0.4 ms
Lead Channel Sensing Intrinsic Amplitude: 2.6 mV
Lead Channel Sensing Intrinsic Amplitude: 7.3 mV
Lead Channel Setting Pacing Amplitude: 2.5 V
Lead Channel Setting Pacing Amplitude: 2.5 V
Lead Channel Setting Pacing Pulse Width: 0.4 ms
Lead Channel Setting Sensing Sensitivity: 2 mV
Pulse Gen Model: 2272
Pulse Gen Serial Number: 7944486

## 2019-10-09 NOTE — Progress Notes (Signed)
Remote pacemaker transmission.   

## 2019-11-03 ENCOUNTER — Other Ambulatory Visit: Payer: Self-pay | Admitting: Medical

## 2019-11-23 ENCOUNTER — Other Ambulatory Visit: Payer: Self-pay | Admitting: Medical

## 2019-11-23 NOTE — Telephone Encounter (Signed)
Pt son would like a call back from the office to further explain. He states that his mother is having trouble cutting the pill or npt remembering to cut the pill and would like to know if she can take 10 mg a day. Please advise

## 2019-11-23 NOTE — Telephone Encounter (Signed)
Pharmacy advised Pts son to have the PCP to prescribe 20MG  pills for atorvastatin (LIPITOR) 10 MG tablet  They dont make a 10mg  / please advise   [id number] [address], [place] Phone:  [phone]  Fax:  [phone]

## 2019-12-02 NOTE — Telephone Encounter (Signed)
?  This was sent to [facility] 10 months ago incorrectly

## 2019-12-03 NOTE — Telephone Encounter (Signed)
I have not seen pt in about 6 months. And I gave verbal authorization for various services in February. Can you call that service and ask them to send over proposed plan of care. I believe home health has some rules on home health. I would be willing to signs plan of care.  Pt likely needs virtual phone visit on Monday or Tuesday.

## 2019-12-06 ENCOUNTER — Telehealth: Payer: Self-pay | Admitting: Medical

## 2019-12-06 MED ORDER — ATORVASTATIN CALCIUM 10 MG PO TABS: 10.0000 mg | ORAL_TABLET | Freq: Every day | ORAL | 3 refills | Status: DC

## 2019-12-06 NOTE — Telephone Encounter (Signed)
Pt son sent me message about refill of her atorvastatin. Sent request to my personal cell phone about 2 weeks ago when I had water damage and was in process of getting new phone and figuring out how to retrieve messages.   Martin Majestic thru old message and saw this weekend. Sent her son Vonita Delaet message back on his phone.   Pt dose in epic is 10 mg but take 1/2 tab daily. She forgets to break in half. Her son is pt of mine. If you can call him and let him know she can take 10 mg daily. Does not have to cuf in half.   I went ahead and sent in 10 mg dose.

## 2019-12-06 NOTE — Telephone Encounter (Signed)
Opened to send in rx atorvastatin.

## 2019-12-06 NOTE — Telephone Encounter (Signed)
Opened to review 

## 2019-12-07 ENCOUNTER — Telehealth: Payer: Self-pay | Admitting: *Deleted

## 2019-12-07 NOTE — Telephone Encounter (Signed)
Patient vaccine was at 1045 this morning.  She may have already got the vaccine.  Contact Type Call Who Is Calling Patient / Member / Family / Caregiver Call Type Triage / Clinical Relationship To Patient Self Return Phone Number [phone] (Primary) Chief Complaint Paging or Request for Consult Reason for Call Request to Speak to a Physician Initial Comment Caller was told to consult with Mackie Pai, PA regarding the COVID vaccine that she is receiving tomorrow. She needs to make sure it is okay with her doctor for her to get this COVID vaccine at 10:45 a.m. tomorrow morning. Translation No Nurse Assessment Nurse: Hassell Done, RN, Mateo Flow Date/Time Eilene Ghazi Time): 12/06/2019 1:29:06 PM Confirm and document reason for call. If symptomatic, describe symptoms. ---Caller states that she is supposed to get the covid vaccine tomorrow and was told she should consult the physician before taking it.

## 2019-12-07 NOTE — Telephone Encounter (Signed)
Had discussed with pt son last night. Was busy today and could not call her back before her vaccine appointment.

## 2019-12-07 NOTE — Telephone Encounter (Signed)
Called to follow up on PT/OT for patient. [facility] stated they did not have a patient with this name or dob. Nor did they know who Stanton Kidney was who called advised that was fine.

## 2019-12-07 NOTE — Telephone Encounter (Signed)
Spoke to patients son and he advised he is happy with that because she cant continue to break them up so he would prefer to do the way you advised. He also stated patient is getting her covid vaccine today at 1045 and asked if there were any adverse effects reported yet for people 95 and older advised him we had not heard any adverse effects as of yet.

## 2019-12-07 NOTE — Telephone Encounter (Signed)
Pt son has me last night about risk of covid vaccine. Discussed benefit vs risk with him. Sorry was too busy earlier today to talk with pt before her vaccine appointment.

## 2019-12-15 ENCOUNTER — Other Ambulatory Visit: Payer: Self-pay | Admitting: Medical

## 2019-12-17 ENCOUNTER — Ambulatory Visit (INDEPENDENT_AMBULATORY_CARE_PROVIDER_SITE_OTHER): Payer: Medicare HMO | Admitting: *Deleted

## 2019-12-17 DIAGNOSIS — I442 Atrioventricular block, complete: Secondary | ICD-10-CM

## 2019-12-17 LAB — CUP PACEART REMOTE DEVICE CHECK
Battery Remaining Longevity: 95 mo
Battery Remaining Percentage: 95.5 %
Battery Voltage: 2.98 V
Brady Statistic AP VP Percent: 39 %
Brady Statistic AP VS Percent: 1 %
Brady Statistic AS VP Percent: 61 %
Brady Statistic AS VS Percent: 1 %
Brady Statistic RA Percent Paced: 39 %
Brady Statistic RV Percent Paced: 99 %
Date Time Interrogation Session: 20210128020013
Implantable Lead Implant Date: 20170907
Implantable Lead Implant Date: 20170907
Implantable Lead Location: 753859
Implantable Lead Location: 753860
Implantable Pulse Generator Implant Date: 20170907
Lead Channel Impedance Value: 390 Ohm
Lead Channel Impedance Value: 400 Ohm
Lead Channel Pacing Threshold Amplitude: 0.5 V
Lead Channel Pacing Threshold Amplitude: 0.875 V
Lead Channel Pacing Threshold Pulse Width: 0.4 ms
Lead Channel Pacing Threshold Pulse Width: 0.4 ms
Lead Channel Sensing Intrinsic Amplitude: 3.6 mV
Lead Channel Sensing Intrinsic Amplitude: 7.3 mV
Lead Channel Setting Pacing Amplitude: 2.5 V
Lead Channel Setting Pacing Amplitude: 2.5 V
Lead Channel Setting Pacing Pulse Width: 0.4 ms
Lead Channel Setting Sensing Sensitivity: 2 mV
Pulse Gen Model: 2272
Pulse Gen Serial Number: 7944486

## 2019-12-17 NOTE — Progress Notes (Signed)
PPM Remote  

## 2020-02-16 ENCOUNTER — Ambulatory Visit (INDEPENDENT_AMBULATORY_CARE_PROVIDER_SITE_OTHER): Payer: Medicare HMO | Admitting: Internal Medicine

## 2020-02-16 ENCOUNTER — Encounter: Payer: Self-pay | Admitting: Internal Medicine

## 2020-02-16 ENCOUNTER — Ambulatory Visit: Payer: Medicare HMO | Admitting: Internal Medicine

## 2020-02-16 VITALS — Ht <= 58 in

## 2020-02-16 DIAGNOSIS — J40 Bronchitis, not specified as acute or chronic: Secondary | ICD-10-CM | POA: Diagnosis not present

## 2020-02-16 MED ORDER — AZITHROMYCIN 250 MG PO TABS: ORAL_TABLET | ORAL | 0 refills | Status: DC

## 2020-02-16 NOTE — Progress Notes (Signed)
Subjective:    Patient ID: Tracey Holt, female    DOB: 07-Aug-1922, 86 y.o.   MRN: [id number]  DOS:  02/16/2020 Type of visit - description: Virtual Visit via Telephone  Attempted  to make this a video visit, due to technical difficulties from the patient side it was not possible  thus we proceeded with a Virtual Visit via Telephone    I connected with above mentioned patient  by telephone and verified that I am speaking with the correct person using two identifiers.  THIS ENCOUNTER IS A VIRTUAL VISIT DUE TO COVID-19 - PATIENT WAS NOT SEEN IN THE OFFICE. PATIENT HAS CONSENTED TO VIRTUAL VISIT / TELEMEDICINE VISIT   Location of patient: home  Location of provider: office  I discussed the limitations, risks, security and privacy concerns of performing an evaluation and management service by telephone and the availability of in person appointments. I also discussed with the patient that there may be a patient responsible charge related to this service. The patient expressed understanding and agreed to proceed.  Acute The patient's son scheduled the visit. I spoke with the patient today. She developed symptoms 2 to 3 days ago: Chills, cough, a very small amount of sputum production, gray in color. She also has a headache but denies sinus congestion. She lives in [facility], she is not aware of anybody sick at her facility.    Review of Systems Denies fever, temperature today 97.8. No nausea, vomiting, diarrhea.  No unusual aches or pains.  Denies shortness of breath or wheezing.  Past Medical History:  Diagnosis Date  . Bradycardic cardiac arrest ([place]) 07/24/2016   Required CPR, intubation with multiple rib fractures. Had short term cardiac shock with acute heart failure.  Resulted in possible stress-induced cardiomyopathy  . Cardiac related syncope 07/24/2016   Bradycardic arrest requiring CPR 2 with transvenous pacemaker placed followed by permanent pacemaker; complicated by  respiratory arrest, type II MI, stress-induced cardiopathy  . Cardiomyopathy ([place]) 07/2016   Moderate severely reduced EF with apical hypokinesis suggestive of stress-induced cardiac myopathy versus multivessel CAD --> in setting of her to cardiac arrest  . COPD (chronic obstructive pulmonary disease) ([place])    By report  . History of chronic constipation   . Hypercholesterolemia   . Osteoporosis   . Pacemaker 07/26/2016   Abbott dual-chamber pacemaker: [place] [id number] - Serial [id number]  . Pneumonia 08/2016  . Sarcoidosis of lung ([place])   . Spinal stenosis     Past Surgical History:  Procedure Laterality Date  . ADENOIDECTOMY    . PACEMAKER INSERTION Left 07/26/2016   Abbott Assurity Model [id number] - Serial [id number] -- complicated by pneumothorax requiring chest tube placement  . TEE WITHOUT CARDIOVERSION  02/2013   EF 55-60% with normal global function. No thrombus, mass or vegetation. Trivial-small pericardial effusion. Moderate LA dilation. Mild MR.  . TONSILLECTOMY    . TRANSTHORACIC ECHOCARDIOGRAM  07/24/2016   [facility]: EF 45-50%. Mid and apical inferior, inferolateral and apical hypokinesis. GR 2 DD. Mild LA and moderate RA dilation. Mild paracardial effusion. Moderate to severe TR. Basal and mid segment hypokinesis with apical hypokinesis. - Suspect stress-induced cardiopathy versus multivessel CAD.  Marland Kitchen TRANSTHORACIC ECHOCARDIOGRAM  07/26/2016   [facility]: LIMITED (post PPM) - although the report suggested EF 30-35% with apical akinesis, rounding note suggested this was an improvement    Allergies as of 02/16/2020      Reactions   Dust Mite Extract  Shortness Of Breath   Levaquin [levofloxacin In D5w] Shortness Of Breath   Lidex [fluocinonide] Swelling   Caused lip swelling   Molds & Smuts Shortness Of Breath   Rifampin Shortness Of Breath   Lactose Other (See Comments)   Unknown rxn per pt   Penicillins  Swelling   Did it involve swelling of the face/tongue/throat, SOB, or low BP? No Did it involve sudden or severe rash/hives, skin peeling, or any reaction on the inside of your mouth or nose? Unknown Did you need to seek medical attention at a hospital or doctor's office? Unknown When did it last happen?Unknown If all above answers are "NO", may proceed with cephalosporin use.      Medication List       Accurate as of February 16, 2020  1:33 PM. If you have any questions, ask your nurse or doctor.        albuterol 108 (90 Base) MCG/ACT inhaler Commonly known as: VENTOLIN HFA Inhale 2 puffs into the lungs every 6 (six) hours as needed for wheezing or shortness of breath.   amLODipine 2.5 MG tablet Commonly known as: NORVASC TAKE 1 TABLET BY MOUTH EVERY DAY   atorvastatin 10 MG tablet Commonly known as: LIPITOR Take 1 tablet (10 mg total) by mouth daily.   benzonatate 100 MG capsule Commonly known as: TESSALON Take 1 capsule (100 mg total) by mouth 3 (three) times daily as needed for cough.   Biotin 10000 MCG Tabs Take 10,000 mcg by mouth daily.   cephALEXin 500 MG capsule Commonly known as: Keflex Take 1 capsule (500 mg total) by mouth 2 (two) times daily.   docusate sodium 100 MG capsule Commonly known as: COLACE Take 100 mg by mouth daily as needed for mild constipation.   guaiFENesin-dextromethorphan 100-10 MG/5ML syrup Commonly known as: ROBITUSSIN DM Take 5 mLs by mouth every 4 (four) hours as needed (chest congestion).   hydroxypropyl methylcellulose / hypromellose 2.5 % ophthalmic solution Commonly known as: ISOPTO TEARS / GONIOVISC Place 1 drop into both eyes daily.   ipratropium 17 MCG/ACT inhaler Commonly known as: Atrovent HFA Inhale 2 puffs into the lungs every 6 (six) hours as needed for wheezing.   levocetirizine 5 MG tablet Commonly known as: XYZAL TAKE 1 TABLET BY MOUTH EVERY DAY IN THE EVENING   multivitamin with minerals tablet Take 1 tablet  by mouth daily.   pantoprazole 40 MG tablet Commonly known as: PROTONIX Take 1 tablet (40 mg total) by mouth daily. What changed:   when to take this  reasons to take this   vitamin C 1000 MG tablet Take 1,000 mg by mouth daily.          Objective:   Physical Exam Ht 4\' 9"  (1.448 m)   BMI 21.13 kg/m  This is a telephone evaluation, she is alert, seems oriented, communication was very difficult, she could not understand well what I was saying, I think she is HOH.    Assessment    86 year old female, PMH includes: Cardiac related syncope, COPD, history of sarcoidosis, presents with:  URI versus bronchitis: Symptoms started 3 days ago, on the phone she sounded well in no distress. Communication was very difficult I believe due to [facility], she does have apparently a memory issue.Marland Kitchen She was not able to understand at times when I was talking. Plan: Schedule Covid testing We will send Zithromax Continue Robitussin Use inhalers as needed Call if fever, chills or if she is not gradually better I  will ask my nurse to discuss above with the patient's son.  (She did via MyChart as requested by the patient's son)  I discussed the assessment and treatment plan with the patient. The patient was provided an opportunity to ask questions and all were answered. The patient agreed with the plan and demonstrated an understanding of the instructions.   The patient was advised to call back or seek an in-person evaluation if the symptoms worsen or if the condition fails to improve as anticipated.  I provided 22 minutes of non-face-to-face time during this encounter.  Kathlene November, MD

## 2020-02-16 NOTE — Progress Notes (Signed)
Pre visit review using our clinic review tool, if applicable. No additional management support is needed unless otherwise documented below in the visit note. 

## 2020-02-17 ENCOUNTER — Ambulatory Visit: Payer: Medicare HMO | Admitting: Internal Medicine

## 2020-02-24 ENCOUNTER — Ambulatory Visit (INDEPENDENT_AMBULATORY_CARE_PROVIDER_SITE_OTHER): Payer: Medicare HMO | Admitting: Medical

## 2020-02-24 ENCOUNTER — Other Ambulatory Visit: Payer: Self-pay

## 2020-02-24 VITALS — BP 138/78 | HR 86 | Temp 98.2°F | Resp 18 | Ht <= 58 in | Wt 94.0 lb

## 2020-02-24 DIAGNOSIS — J301 Allergic rhinitis due to pollen: Secondary | ICD-10-CM | POA: Diagnosis not present

## 2020-02-24 DIAGNOSIS — R519 Headache, unspecified: Secondary | ICD-10-CM

## 2020-02-24 MED ORDER — AZELASTINE HCL 0.1 % NA SOLN: 2.0000 | Freq: Two times a day (BID) | NASAL | 12 refills | Status: DC

## 2020-02-24 MED ORDER — KETOROLAC TROMETHAMINE 60 MG/2ML IM SOLN
60.0000 mg | Freq: Once | INTRAMUSCULAR | Status: AC
  Administered 2020-02-24: 60 mg via INTRAMUSCULAR

## 2020-02-24 NOTE — Addendum Note (Signed)
Addended by: Jeronimo Greaves on: 02/24/2020 04:13 PM   Modules accepted: Orders

## 2020-02-24 NOTE — Patient Instructions (Signed)
Recent moderate level headache for 1 month with normal neurologic exam today.  Blood pressure check reveals good BP level today.  Most recent CT done in past year was negative for acute abnormality.  We will treat headache with Toradol 30 mg IM.  If you have residual intermittent headaches in the future can use Tylenol.  Would avoid aspirin use for headaches or body aches.  Recommend getting sed rate today.  You do have history of allergic rhinitis this time a year and want you to continue Xyzal and will add Astelin in the event that frontal region headache represents sinus inflammation.  Presently I do not think he has any infection.  Follow-up in 10 to 14 days or as needed.

## 2020-02-24 NOTE — Progress Notes (Signed)
   Subjective:    Patient ID: Tracey Holt, female    DOB: [birth year], 86 y.o.   MRN: [id number]  HPI  Pt in with frontal area headache. Describes all over. She states ha has been going on for full month. Pt pain level is 5/10. Pt states pain is from bridge of nose toward temporal area and toward top of head.   Pt had covid vaccines.  Faint light sensitive. No nausea, no vomiting and no gross motor/sensory function deficits. No hx of chronic ha.   She does have hx of allergies in the spring.  Pt gets bp checked where she lives. Usually her bp in [id number] systolic per her report.  Ct of head in past negative for acute abnormality.   Review of Systems     Objective:   Physical Exam  General  Mental Status - Alert. General Appearance - Well groomed. Not in acute distress.  Skin Rashes- No Rashes.  HEENT Head- Normal.  Nose & Sinuses Nasal Mucosa- Left-  Boggy and Congested. Right-  Boggy and  Congested.Bilateral mild maxillary and frontal sinus pressure.  Neck Neck- Supple. No Masses.   Chest and Lung Exam Auscultation: Breath Sounds:-Clear even and unlabored.  Cardiovascular Auscultation:Rythm- Regular, rate and rhythm. Murmurs & Other Heart Sounds:Ausculatation of the heart reveal- No Murmurs.  Lymphatic Head & Neck General Head & Neck Lymphatics: Bilateral: Description- No Localized lymphadenopathy.      Neurologic Cranial Nerve exam:- CN III-XII intact(No nystagmus), symmetric smile. Strength:- 5/5 equal and symmetric strength both upper and lower extremities.      Assessment & Plan:  Recent moderate level headache for 1 month with normal neurologic exam today.  Blood pressure check reveals good BP level today.  Most recent CT done in past year was negative for acute abnormality.  We will treat headache with Toradol 30 mg IM.  If you have residual intermittent headaches in the future can use Tylenol.  Would avoid aspirin use for headaches or body aches.   Recommend getting sed rate today.  You do have history of allergic rhinitis this time a year and want you to continue Xyzal and will add Astelin in the event that frontal region headache represents sinus inflammation.  Presently I do not think he has any infection.  Follow-up in 10 to 14 days or as needed.  Mackie Pai, PA-C   Time spent with patient today was  25 minutes which consisted of chart review, discussing diagnosis, work up, treatment and documentation.

## 2020-02-25 LAB — SEDIMENTATION RATE: Sed Rate: 8 mm/hr (ref 0–30)

## 2020-02-29 ENCOUNTER — Other Ambulatory Visit: Payer: Self-pay | Admitting: Medical

## 2020-03-01 ENCOUNTER — Telehealth: Payer: Self-pay | Admitting: Medical

## 2020-03-01 NOTE — Telephone Encounter (Signed)
Pt notified and headache is moderate.

## 2020-03-01 NOTE — Telephone Encounter (Signed)
She can try Tylenol for headache and give [place] an update tomorrow.

## 2020-03-01 NOTE — Telephone Encounter (Signed)
Stop astelin and see if possible side effect resolve. Give [place] update tomorrow if symptoms persist. She may need office.   Is ha severe or mild?

## 2020-03-01 NOTE — Telephone Encounter (Signed)
PT called and was told to take 2  Tylenol every 6 hours as needed. And Call us on tomorrow.

## 2020-03-01 NOTE — Telephone Encounter (Signed)
CALLER : Starkisha Mercier  Call Back # [phone]   Concern: Headache, Dry Mouth azelastine (ASTELIN) 0.1 % nasal spray [id number]   Patient states that she is having side effects from medication, patient is looking for advise on what to do.    Please Advise

## 2020-03-12 ENCOUNTER — Other Ambulatory Visit: Payer: Self-pay | Admitting: Medical

## 2020-03-17 ENCOUNTER — Ambulatory Visit (INDEPENDENT_AMBULATORY_CARE_PROVIDER_SITE_OTHER): Payer: Medicare HMO | Admitting: *Deleted

## 2020-03-17 DIAGNOSIS — I442 Atrioventricular block, complete: Secondary | ICD-10-CM | POA: Diagnosis not present

## 2020-03-17 LAB — CUP PACEART REMOTE DEVICE CHECK
Battery Remaining Longevity: 90 mo
Battery Remaining Percentage: 95.5 %
Battery Voltage: 2.96 V
Brady Statistic AP VP Percent: 39 %
Brady Statistic AP VS Percent: 1 %
Brady Statistic AS VP Percent: 61 %
Brady Statistic AS VS Percent: 1 %
Brady Statistic RA Percent Paced: 39 %
Brady Statistic RV Percent Paced: 99 %
Date Time Interrogation Session: 20210429063757
Implantable Lead Implant Date: 20170907
Implantable Lead Implant Date: 20170907
Implantable Lead Location: 753859
Implantable Lead Location: 753860
Implantable Pulse Generator Implant Date: 20170907
Lead Channel Impedance Value: 360 Ohm
Lead Channel Impedance Value: 410 Ohm
Lead Channel Pacing Threshold Amplitude: 0.5 V
Lead Channel Pacing Threshold Amplitude: 0.75 V
Lead Channel Pacing Threshold Pulse Width: 0.4 ms
Lead Channel Pacing Threshold Pulse Width: 0.4 ms
Lead Channel Sensing Intrinsic Amplitude: 3 mV
Lead Channel Sensing Intrinsic Amplitude: 7.3 mV
Lead Channel Setting Pacing Amplitude: 2.5 V
Lead Channel Setting Pacing Amplitude: 2.5 V
Lead Channel Setting Pacing Pulse Width: 0.4 ms
Lead Channel Setting Sensing Sensitivity: 2 mV
Pulse Gen Model: 2272
Pulse Gen Serial Number: 7944486

## 2020-03-18 NOTE — Progress Notes (Signed)
PPM Remote  

## 2020-04-16 ENCOUNTER — Other Ambulatory Visit: Payer: Self-pay

## 2020-04-16 ENCOUNTER — Emergency Department (HOSPITAL_COMMUNITY)
Admission: EM | Admit: 2020-04-16 | Discharge: 2020-04-16 | Disposition: A | Discharge status: Home / Self Care | Payer: Medicare HMO | Attending: Emergency Medicine | Admitting: Emergency Medicine

## 2020-04-16 ENCOUNTER — Emergency Department (HOSPITAL_COMMUNITY): Payer: Medicare HMO

## 2020-04-16 DIAGNOSIS — Z950 Presence of cardiac pacemaker: Secondary | ICD-10-CM | POA: Insufficient documentation

## 2020-04-16 DIAGNOSIS — Z79899 Other long term (current) drug therapy: Secondary | ICD-10-CM | POA: Insufficient documentation

## 2020-04-16 DIAGNOSIS — J449 Chronic obstructive pulmonary disease, unspecified: Secondary | ICD-10-CM | POA: Diagnosis not present

## 2020-04-16 DIAGNOSIS — Z87891 Personal history of nicotine dependence: Secondary | ICD-10-CM | POA: Diagnosis not present

## 2020-04-16 DIAGNOSIS — R0602 Shortness of breath: Secondary | ICD-10-CM | POA: Diagnosis not present

## 2020-04-16 LAB — BASIC METABOLIC PANEL
Anion gap: 6 (ref 5–15)
BUN: 14 mg/dL (ref 8–23)
CO2: 30 mmol/L (ref 22–32)
Calcium: 8.9 mg/dL (ref 8.9–10.3)
Chloride: 99 mmol/L (ref 98–111)
Creatinine, Ser: 0.5 mg/dL (ref 0.44–1.00)
GFR calc Af Amer: >60 mL/min (ref >60)
GFR calc non Af Amer: >60 mL/min (ref >60)
Glucose, Bld: 94 mg/dL (ref 70–99)
Potassium: 4.1 mmol/L (ref 3.5–5.1)
Sodium: 135 mmol/L (ref 135–145)

## 2020-04-16 LAB — CBC WITH DIFFERENTIAL/PLATELET
Abs Immature Granulocytes: 0.01 10*3/uL (ref 0.00–0.07)
Basophils Absolute: 0 10*3/uL (ref 0.0–0.1)
Basophils Relative: 1 %
Eosinophils Absolute: 0.1 10*3/uL (ref 0.0–0.5)
Eosinophils Relative: 2 %
HCT: 37.5 % (ref 36.0–46.0)
Hemoglobin: 12.1 g/dL (ref 12.0–15.0)
Immature Granulocytes: 0 %
Lymphocytes Relative: 23 %
Lymphs Abs: 1 10*3/uL (ref 0.7–4.0)
MCH: 29.4 pg (ref 26.0–34.0)
MCHC: 32.3 g/dL (ref 30.0–36.0)
MCV: 91.2 fL (ref 80.0–100.0)
Monocytes Absolute: 0.6 10*3/uL (ref 0.1–1.0)
Monocytes Relative: 13 %
Neutro Abs: 2.6 10*3/uL (ref 1.7–7.7)
Neutrophils Relative %: 61 %
Platelets: 182 10*3/uL (ref 150–400)
RBC: 4.11 MIL/uL (ref 3.87–5.11)
RDW: 13.5 % (ref 11.5–15.5)
WBC: 4.4 10*3/uL (ref 4.0–10.5)
nRBC: 0 % (ref 0.0–0.2)

## 2020-04-16 LAB — BRAIN NATRIURETIC PEPTIDE: B Natriuretic Peptide: 106.8 pg/mL — ABNORMAL HIGH (ref 0.0–100.0)

## 2020-04-16 LAB — TROPONIN I (HIGH SENSITIVITY): Troponin I (High Sensitivity): 3 ng/L (ref <18)

## 2020-04-16 NOTE — ED Triage Notes (Signed)
Patient states "I just don't feel good". Patient's son present in triage and states patient called him at 0630 stating she was short of breath. Patient's son says it may be her anxiety.

## 2020-04-16 NOTE — Discharge Instructions (Addendum)
Please follow up with your primary care provider next week in the office.

## 2020-04-16 NOTE — ED Provider Notes (Signed)
[facility] DEPT Provider Note   CSN: [id number] Arrival date & time: 04/16/20  [id number]     History Chief Complaint  Patient presents with  . Shortness of Breath    Tracey Holt is a 86 y.o. female history of cardiomyopathy and congestive heart failure, status post pacemaker placement, presenting from assisted living facility with concern for shortness of breath.  The patient son is also present at the bedside.  He tells me the patient called him around 6:30 in the morning saying that she felt short of breath.  By the time he arrived at the facility she was ready feeling better.  He has a strong suspicion this may be related to anxiety and says she had similar episodes in the past.  Patient herself is very pleasant hard of hearing.  She tells me that she woke up early this morning feeling like she was short of breath.  She denies any chest pressure or pain.  She says that the symptoms spontaneously resolved after her son's arrival.  She denies any new leg swelling.  She is ambulatory at baseline with a walker.  She does not feel more short of breath than usual.  She denies any nausea or vomiting today.  Quit smoking 50 years ago  HPI     Past Medical History:  Diagnosis Date  . Bradycardic cardiac arrest ([place]) 07/24/2016   Required CPR, intubation with multiple rib fractures. Had short term cardiac shock with acute heart failure.  Resulted in possible stress-induced cardiomyopathy  . Cardiac related syncope 07/24/2016   Bradycardic arrest requiring CPR 2 with transvenous pacemaker placed followed by permanent pacemaker; complicated by respiratory arrest, type II MI, stress-induced cardiopathy  . Cardiomyopathy ([place]) 07/2016   Moderate severely reduced EF with apical hypokinesis suggestive of stress-induced cardiac myopathy versus multivessel CAD --> in setting of her to cardiac arrest  . COPD (chronic obstructive pulmonary disease) ([place])    By report  .  History of chronic constipation   . Hypercholesterolemia   . Osteoporosis   . Pacemaker 07/26/2016   Abbott dual-chamber pacemaker: [place] [id number] - Serial [id number]  . Pneumonia 08/2016  . Sarcoidosis of lung ([place])   . Spinal stenosis     Patient Active Problem List   Diagnosis Date Noted  . Acute respiratory failure with hypoxia ([place]) 01/02/2019  . Constipation 01/02/2019  . Hypokalemia 01/02/2019  . Acute lower UTI 01/02/2019  . Hyponatremia 12/23/2018  . Disorientation   . Hyponatremia syndrome 08/13/2018  . Acute metabolic encephalopathy [id number]  . Aspiration pneumonia of both lower lobes due to gastric secretions ([place]) 08/13/2018  . Intractable headache 04/07/2018  . Abnormal CT of the abdomen 04/07/2018  . CHB (complete heart block) ([place]) 12/20/2016  . Chronic systolic congestive heart failure ([place]) 12/20/2016  . Angioedema 12/17/2016  . Chronic rhinitis 12/17/2016  . Dyspnea 11/09/2016  . Upper airway cough syndrome 10/29/2016  . Abnormal CT of the chest  c/w lipoid pna 10/29/2016  . Toe ulcer, right ([place]) 10/17/2016  . S/P placement of cardiac pacemaker 09/19/2016  . History of syncope 09/19/2016  . Bradycardic cardiac arrest ([place]) 07/24/2016    Past Surgical History:  Procedure Laterality Date  . ADENOIDECTOMY    . PACEMAKER INSERTION Left 07/26/2016   Abbott Assurity Model [id number] - Serial [id number] -- complicated by pneumothorax requiring chest tube placement  . TEE WITHOUT CARDIOVERSION  02/2013   EF 55-60% with normal global function. No thrombus, mass or vegetation.  Trivial-small pericardial effusion. Moderate LA dilation. Mild MR.  . TONSILLECTOMY    . TRANSTHORACIC ECHOCARDIOGRAM  07/24/2016   [facility]: EF 45-50%. Mid and apical inferior, inferolateral and apical hypokinesis. GR 2 DD. Mild LA and moderate RA dilation. Mild paracardial effusion. Moderate to severe TR. Basal and mid segment hypokinesis with apical  hypokinesis. - Suspect stress-induced cardiopathy versus multivessel CAD.  Marland Kitchen TRANSTHORACIC ECHOCARDIOGRAM  07/26/2016   [place]: LIMITED (post PPM) - although the report suggested EF 30-35% with apical akinesis, rounding note suggested this was an improvement     OB History   No obstetric history on file.     Family History  Problem Relation Age of Onset  . Allergic rhinitis Neg Hx   . Angioedema Neg Hx   . Asthma Neg Hx   . Eczema Neg Hx   . Immunodeficiency Neg Hx   . Urticaria Neg Hx     Social History   Tobacco Use  . Smoking status: Former Smoker    Packs/day: 1.50    Years: 17.00    Pack years: 25.50    Types: Cigarettes    Start date: 12/18/1963    Quit date: 11/20/1983    Years since quitting: 36.4  . Smokeless tobacco: Never Used  Substance Use Topics  . Alcohol use: No  . Drug use: No    Home Medications Prior to Admission medications   Medication Sig Start Date End Date Taking? Authorizing Provider  albuterol (PROVENTIL HFA;VENTOLIN HFA) 108 (90 Base) MCG/ACT inhaler Inhale 2 puffs into the lungs every 6 (six) hours as needed for wheezing or shortness of breath. 09/12/18  Yes Saguier, Percell Miller, PA-C  amLODipine (NORVASC) 2.5 MG tablet TAKE 1 TABLET BY MOUTH EVERY DAY Patient taking differently: Take 2.5 mg by mouth daily.  03/01/20  Yes Saguier, Percell Miller, PA-C  Ascorbic Acid (VITAMIN C) 1000 MG tablet Take 1,000 mg by mouth daily.   Yes [provider]  atorvastatin (LIPITOR) 10 MG tablet Take 1 tablet (10 mg total) by mouth daily. 03/14/20  Yes Saguier, Percell Miller, PA-C  azelastine (ASTELIN) 0.1 % nasal spray Place 2 sprays into both nostrils 2 (two) times daily. Use in each nostril as directed 02/24/20  Yes Saguier, Iris Pert  Biotin 10000 MCG TABS Take 10,000 mcg by mouth daily.    Yes [provider]  docusate sodium (COLACE) 100 MG capsule Take 100 mg by mouth daily as needed for mild constipation.    Yes [provider]  guaiFENesin-dextromethorphan (ROBITUSSIN DM) 100-10 MG/5ML syrup Take 5 mLs by mouth every 4 (four) hours as needed (chest congestion). 12/25/18  Yes Reyne Dumas, MD  hydroxypropyl methylcellulose / hypromellose (ISOPTO TEARS / GONIOVISC) 2.5 % ophthalmic solution Place 1 drop into both eyes daily.    Yes [provider]  levocetirizine (XYZAL) 5 MG tablet TAKE 1 TABLET BY MOUTH EVERY DAY IN THE EVENING Patient taking differently: Take 5 mg by mouth every evening.  11/23/19  Yes Saguier, Percell Miller, PA-C  Multiple Vitamins-Minerals (MULTIVITAMIN WITH MINERALS) tablet Take 1 tablet by mouth daily.   Yes [provider]  azithromycin (ZITHROMAX) 250 MG tablet Take 2 tablets by mouth first day, then 1 tablet for 4 additional days Patient not taking: Reported on 04/16/2020 02/16/20   Colon Branch, MD  benzonatate (TESSALON) 100 MG capsule Take 1 capsule (100 mg total) by mouth 3 (three) times daily as needed for cough. Patient not taking: Reported on 02/16/2020 12/19/18  Saguier, Percell Miller, PA-C  ipratropium (ATROVENT HFA) 17 MCG/ACT inhaler Inhale 2 puffs into the lungs every 6 (six) hours as needed for wheezing. Patient not taking: Reported on 01/02/2019 09/12/18   Saguier, Percell Miller, PA-C  pantoprazole (PROTONIX) 40 MG tablet Take 1 tablet (40 mg total) by mouth daily. Patient not taking: Reported on 02/16/2020 12/25/18   Reyne Dumas, MD    Allergies    Dust mite extract, Levaquin [levofloxacin in d5w], Lidex [fluocinonide], Molds & smuts, Rifampin, Lactose, and Penicillins  Review of Systems   Review of Systems  Constitutional: Negative for chills and fever.  HENT: Negative for ear pain and sore throat.   Eyes: Negative for pain and visual disturbance.  Respiratory: Positive for shortness of breath. Negative for cough.   Cardiovascular: Negative for chest pain and palpitations.  Gastrointestinal: Negative for abdominal pain and vomiting.  Genitourinary: Negative for dysuria  and hematuria.  Musculoskeletal: Negative for arthralgias and back pain.  Skin: Negative for color change and rash.  Neurological: Negative for syncope and headaches.  All other systems reviewed and are negative.   Physical Exam Updated Vital Signs BP (!) 140/91   Pulse 68   Temp 98.1 F (36.7 C) (Oral)   Resp 18   Ht 4\' 6"  (1.372 m)   Wt 43.1 kg   SpO2 97%   BMI 22.91 kg/m   Physical Exam Vitals and nursing note reviewed.  Constitutional:      General: She is not in acute distress.    Appearance: She is well-developed.  HENT:     Head: Normocephalic and atraumatic.  Eyes:     Conjunctiva/sclera: Conjunctivae normal.  Cardiovascular:     Rate and Rhythm: Normal rate and regular rhythm.     Pulses: Normal pulses.  Pulmonary:     Effort: Pulmonary effort is normal. No respiratory distress.     Breath sounds: Normal breath sounds.  Abdominal:     Palpations: Abdomen is soft.     Tenderness: There is no abdominal tenderness.  Musculoskeletal:     Cervical back: Neck supple.  Skin:    General: Skin is warm and dry.  Neurological:     General: No focal deficit present.     Mental Status: She is alert and oriented to person, place, and time.     ED Results / Procedures / Treatments   Labs (all labs ordered are listed, but only abnormal results are displayed) Labs Reviewed  BRAIN NATRIURETIC PEPTIDE - Abnormal; Notable for the following components:      Result Value   B Natriuretic Peptide 106.8 (*)    All other components within normal limits  CBC WITH DIFFERENTIAL/PLATELET  BASIC METABOLIC PANEL  TROPONIN I (HIGH SENSITIVITY)    EKG EKG Interpretation  Date/Time:  Saturday Apr 16 2020 08:34:16 EDT Ventricular Rate:  85 PR Interval:    QRS Duration: 147 QT Interval:  415 QTC Calculation: 494 R Axis:   -88 Text Interpretation: Sinus rhythm Left atrial enlargement Right bundle branch block LVH with secondary repolarization abnormality ST elevations do not  meet sgarbossa criteria in setting of LBBB and V paced rhythm Anterior infarct, old Lateral leads are also involved Probable RV involvement, suggest recording right precordial leads 12 Lead; [place] >>> Acute MI <<< Confirmed by Octaviano Glow [phone]) on 04/16/2020 10:31:17 AM   Radiology DG Chest Portable 1 View  Result Date: 04/16/2020 CLINICAL DATA:  Shortness of breath EXAM: PORTABLE CHEST 1 VIEW COMPARISON:  01/02/2019 FINDINGS: Cardiac shadow is  enlarged but stable. Aortic calcifications are seen. Pacing device is noted and stable. Small hiatal hernia is again seen. The lungs are clear. No bony abnormality is noted. IMPRESSION: Small hiatal hernia.  No other focal abnormality is noted. Electronically Signed   By: Inez Catalina M.D.   On: 04/16/2020 10:39    Procedures Procedures (including critical care time)  Medications Ordered in ED Medications - No data to display  ED Course  I have reviewed the triage vital signs and the nursing notes.  Pertinent labs & imaging results that were available during my care of the patient were reviewed by me and considered in my medical decision making (see chart for details).  This is a very sweet 86 yo female here with transient episode of SOB, which occurred upon awakening this morning, and subsequently resolved prior to her arrival  DDx includes atypical ACS/chest pain vs PNA vs CHF vs anxiety vs other  Well appearing in the room  Labs ordered and personally reviewed showing BNP 106, Trop 3, unremarkable BMP and CBC No evidence of anemia or dehydration per labwork DG chest personally reviewed with pacemaker leads, no consolidation seen ECG shows V-paced rhythm with ST elevations consistent with LBBB/pacemaker rhythm but does not meet sgarbossa criteria  With a troponin of 3, doubtful this is ACS  Additional history provided by her son at bedside  Clinical Course as of Apr 17 1711  Sat Apr 16, 2020  1218 Very mild elevation in BNP,  but near her baseline for the past year, doubtful this level represents severe CHF.  This may indeed have been an anxiety episode per her son's opinion.  Okay for discharge.   [MT]  1221 She remains asymptomatic.  I believe is reasonable to discharge her.  Her son be taken her home.   [MT]    Clinical Course User Index [MT] Deamonte Sayegh, Carola Rhine, MD    Final Clinical Impression(s) / ED Diagnoses Final diagnoses:  SOB (shortness of breath)    Rx / DC Orders ED Discharge Orders    None       Langston Masker Carola Rhine, MD 04/16/20 [phone]

## 2020-04-25 ENCOUNTER — Other Ambulatory Visit: Payer: Self-pay

## 2020-04-25 ENCOUNTER — Other Ambulatory Visit: Payer: Self-pay | Admitting: Medical

## 2020-04-25 MED ORDER — PROAIR HFA 108 (90 BASE) MCG/ACT IN AERS: 2.0000 | INHALATION_SPRAY | Freq: Four times a day (QID) | RESPIRATORY_TRACT | 5 refills | Status: DC | PRN

## 2020-04-25 MED ORDER — ALBUTEROL SULFATE HFA 108 (90 BASE) MCG/ACT IN AERS: 2.0000 | INHALATION_SPRAY | Freq: Four times a day (QID) | RESPIRATORY_TRACT | 0 refills | Status: DC | PRN

## 2020-04-25 NOTE — Telephone Encounter (Signed)
Patient is calling to request status of medication refill.

## 2020-04-25 NOTE — Telephone Encounter (Signed)
Medication: albuterol (PROVENTIL HFA;VENTOLIN HFA) 108 (90 Base) MCG/ACT inhaler      Has the patient contacted their pharmacy?  (If no, request that the patient contact the pharmacy for the refill.) (If yes, when and what did the pharmacy advise?)     Preferred Pharmacy (with phone number or street name): [id number] [place], [place]  [address] [place] [address]  Phone:  [phone] Fax:  [phone]     Agent: Please be advised that RX refills may take up to 3 business days. We ask that you follow-up with your pharmacy.

## 2020-04-25 NOTE — Telephone Encounter (Signed)
rx sent in.    No answer and vm was full.

## 2020-05-30 IMAGING — DX DG CHEST 2V
2 series · 2 of 2 positions shown · non-contrast
Comparison: 12/06/2018

CLINICAL DATA: Cough for 10 days, COPD, pacemaker, history COPD,
sarcoidosis, cardiomyopathy, former smoker

EXAM:
CHEST - 2 VIEW

[chest pa]
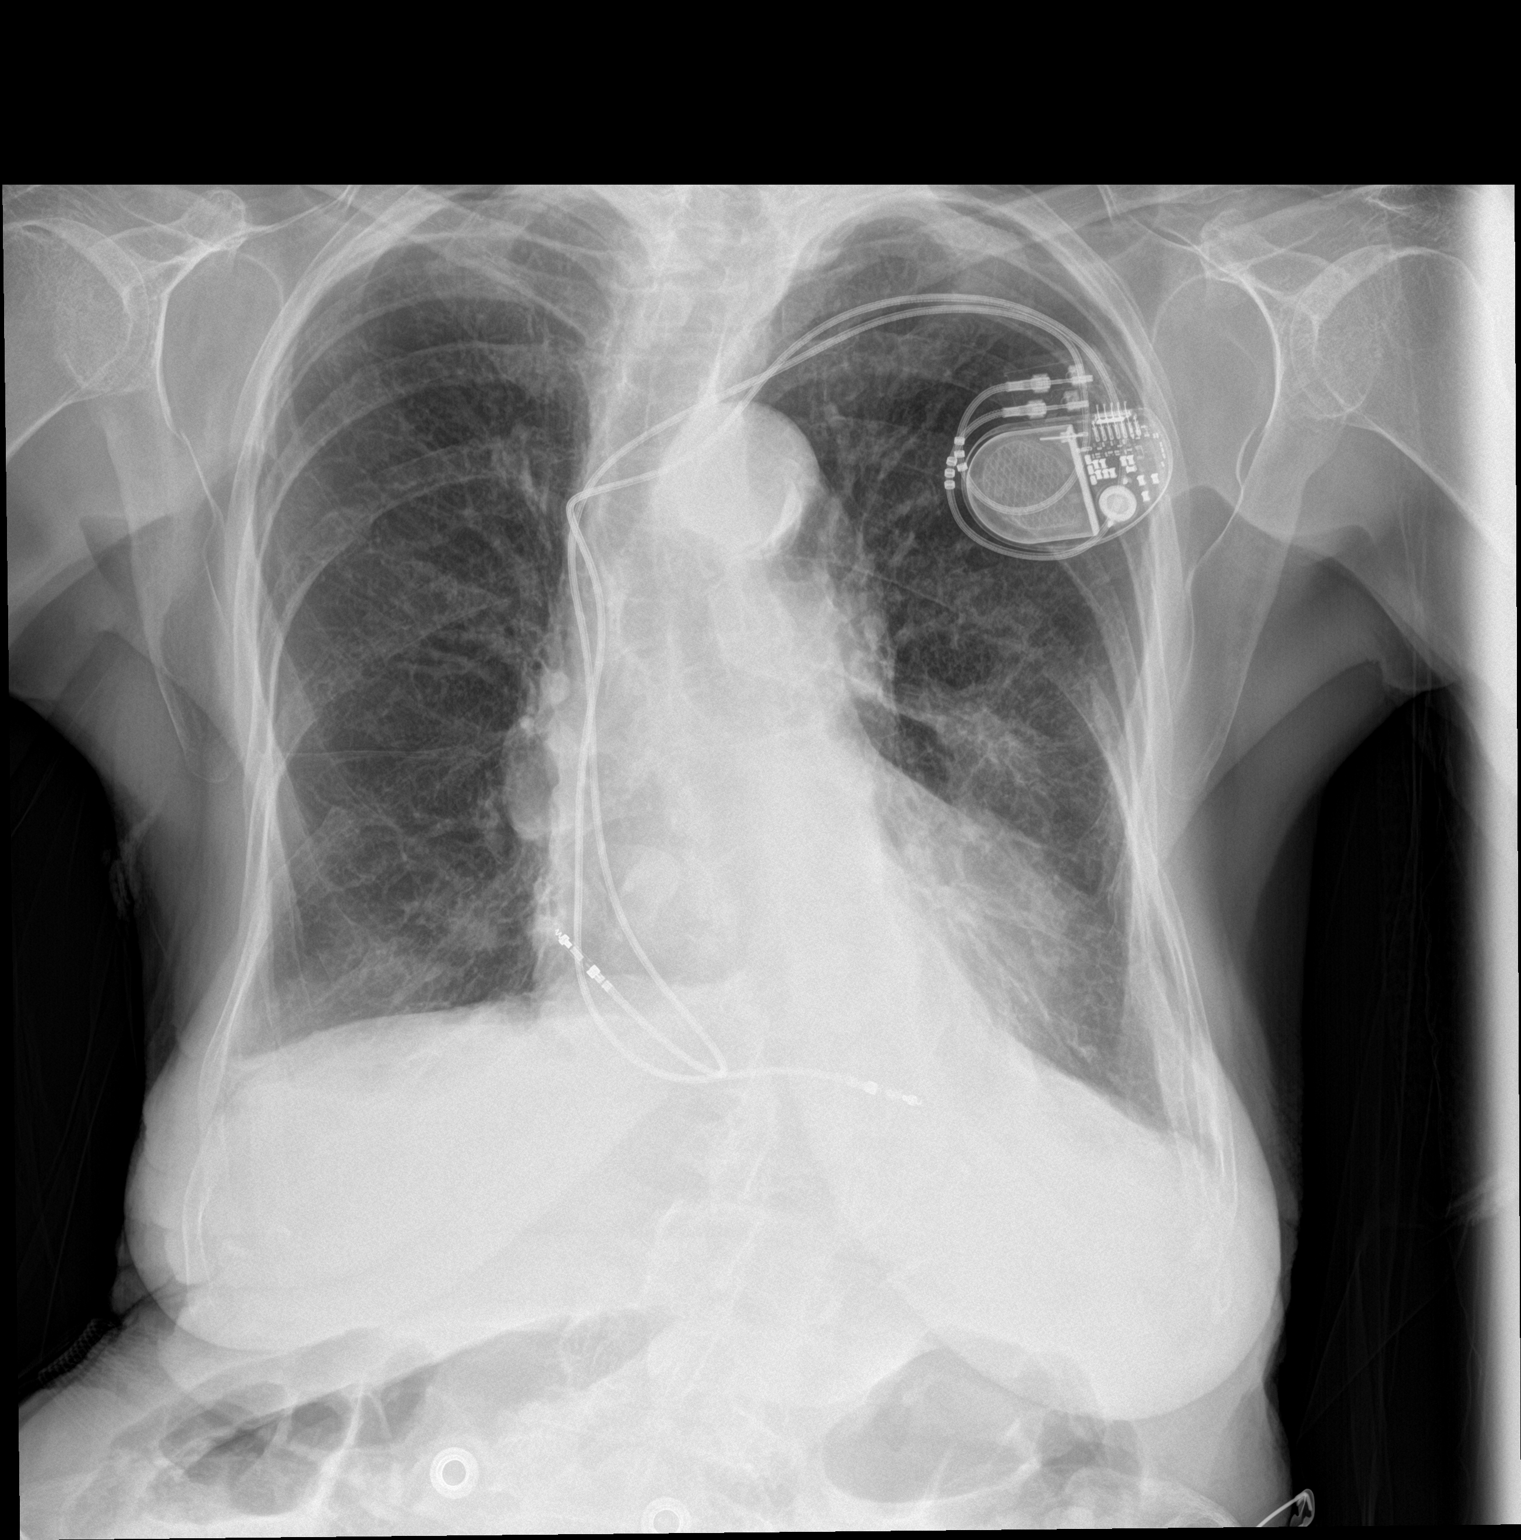

[chest lat]
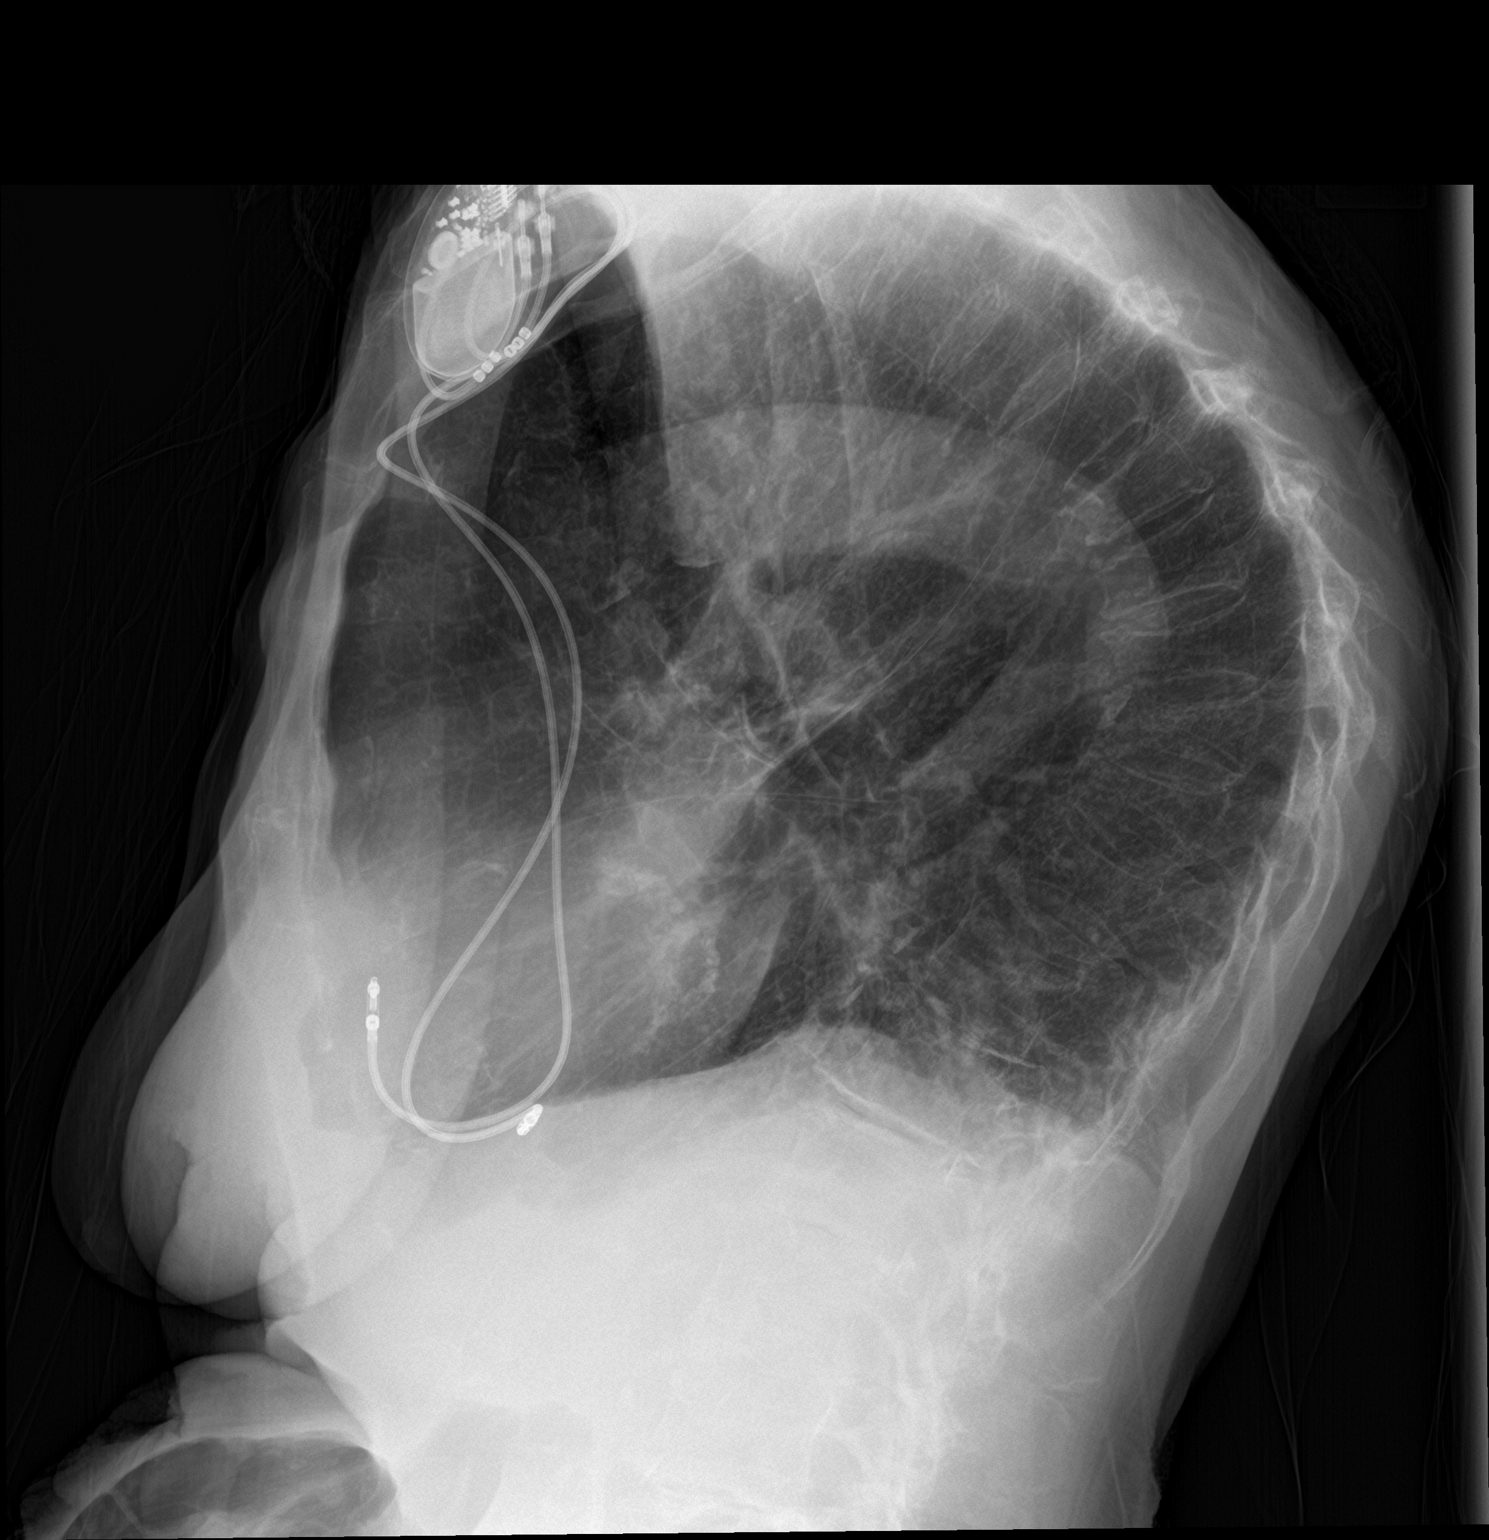

[2 of 2 positions shown; findings below may reference images not displayed]

FINDINGS: LEFT subclavian transvenous pacemaker leads project at RIGHT atrium
and RIGHT ventricle unchanged.

Enlargement of cardiac silhouette.

Atherosclerotic calcification aorta.

Mediastinal contours and pulmonary vascularity normal.

Emphysematous and minimal bronchitic changes consistent with COPD.

Probable small calcified RIGHT hilar lymph nodes.

Chronic streaky atelectasis LEFT base.

New LEFT mid lung opacity since 12/06/2018 question pneumonia.

Bibasilar atelectasis.

No pneumothorax.

Osseous demineralization with multiple thoracic compression
fractures and accentuated thoracic kyphosis.
IMPRESSION: COPD changes with bibasilar atelectasis and suspect developing LEFT
perihilar pneumonia.

## 2020-06-07 ENCOUNTER — Other Ambulatory Visit: Payer: Self-pay | Admitting: Medical

## 2020-06-07 IMAGING — DX DG SHOULDER 1V*L*
1 series · 1 of 1 positions shown · non-contrast
Comparison: CT 12/23/2017.  Chest x-ray 12/23/2017.

CLINICAL DATA: Shortness of breath.  Left arm pain.

EXAM:
LEFT SHOULDER - 1 VIEW

[shoulder ap]
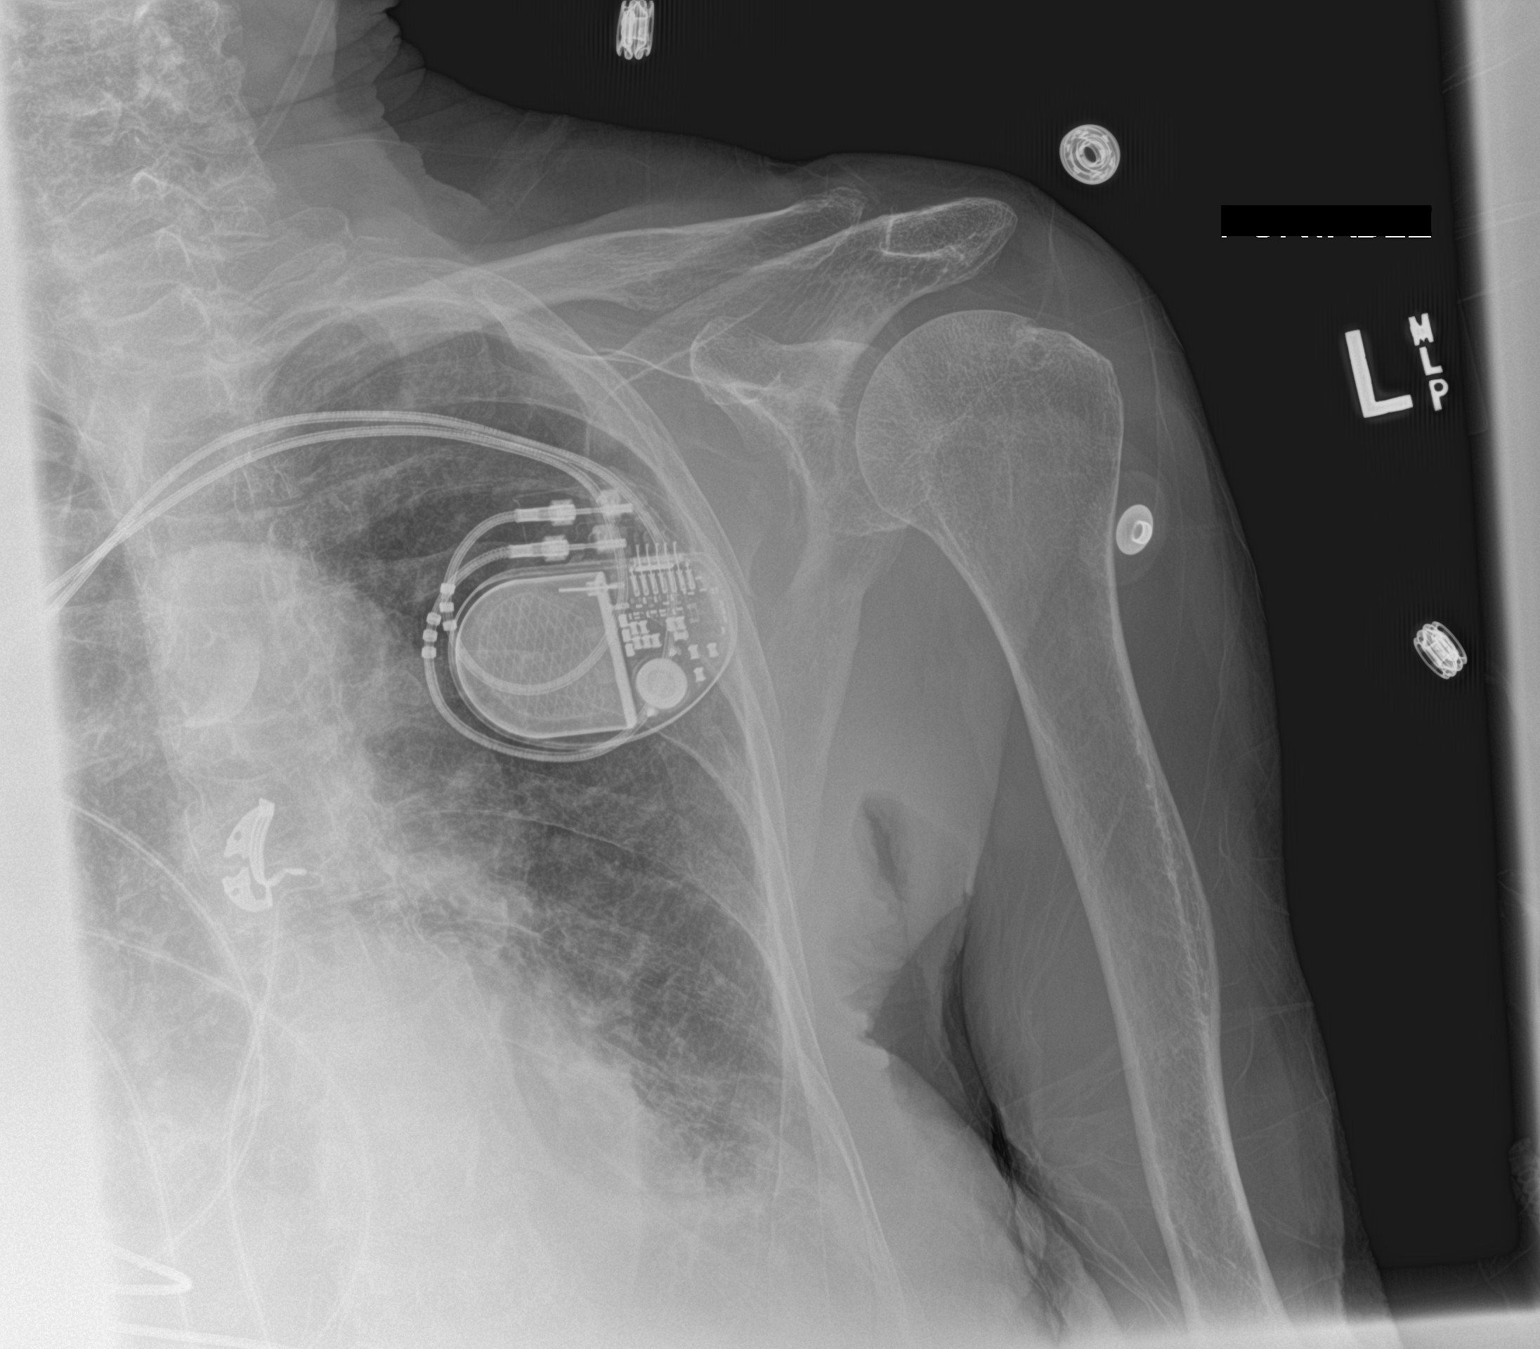

[1 of 1 positions shown; findings below may reference images not displayed]

FINDINGS: Diffuse osteopenia. Acromioclavicular and glenohumeral degenerative
change. No acute bony abnormality. Cardiac pacer noted. Left base
atelectasis/infiltrate and small left pleural effusion.
IMPRESSION: 1. Diffuse osteopenia. Acromioclavicular glenohumeral degenerative
change. No acute abnormality.

2. Left base atelectasis/infiltrate and small left pleural effusion.

3.  Cardiac pacer noted..

## 2020-06-07 IMAGING — CR DG ABDOMEN 1V
1 series · 1 of 1 positions shown · non-contrast
Comparison: Abdomen pelvis CT dated 04/07/2018. Chest CTA obtained
earlier today.

CLINICAL DATA: Abdominal pain and constipation.

EXAM:
ABDOMEN - 1 VIEW

[x abdomen supine]
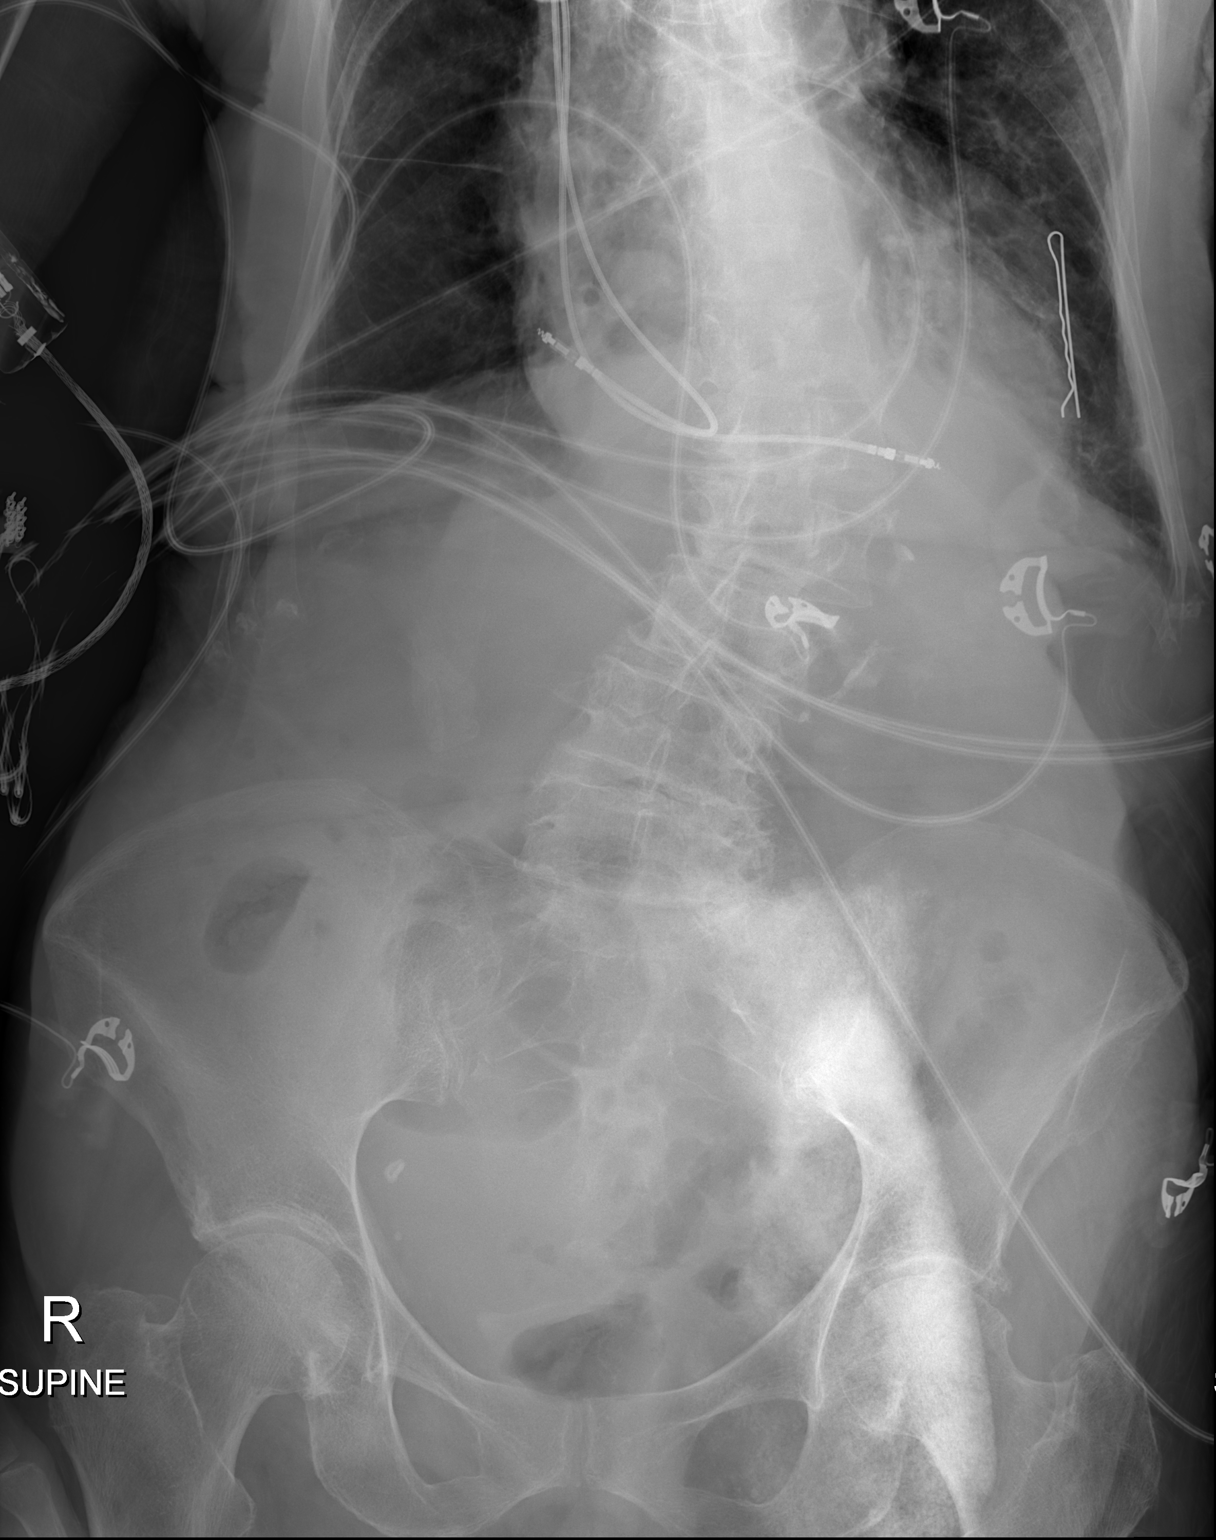

[1 of 1 positions shown; findings below may reference images not displayed]

FINDINGS: Normal bowel gas pattern with a paucity of intestinal gas. No
significant stool visualized. Calcific density overlying the left
pelvis with an appearance suggesting an image artifact. Moderate
levoconvex thoracolumbar rotary scoliosis. Previously described
chronic opacity in the left lung. Enlarged heart and pacemaker
leads.
IMPRESSION: No acute abnormality.

## 2020-06-16 ENCOUNTER — Ambulatory Visit (INDEPENDENT_AMBULATORY_CARE_PROVIDER_SITE_OTHER): Payer: Medicare HMO | Admitting: *Deleted

## 2020-06-16 DIAGNOSIS — I442 Atrioventricular block, complete: Secondary | ICD-10-CM | POA: Diagnosis not present

## 2020-06-16 LAB — CUP PACEART REMOTE DEVICE CHECK
Battery Remaining Longevity: 89 mo
Battery Remaining Percentage: 95.5 %
Battery Voltage: 2.96 V
Brady Statistic AP VP Percent: 38 %
Brady Statistic AP VS Percent: 1 %
Brady Statistic AS VP Percent: 62 %
Brady Statistic AS VS Percent: 1 %
Brady Statistic RA Percent Paced: 38 %
Brady Statistic RV Percent Paced: 99 %
Date Time Interrogation Session: 20210729020018
Implantable Lead Implant Date: 20170907
Implantable Lead Implant Date: 20170907
Implantable Lead Location: 753859
Implantable Lead Location: 753860
Implantable Pulse Generator Implant Date: 20170907
Lead Channel Impedance Value: 350 Ohm
Lead Channel Impedance Value: 400 Ohm
Lead Channel Pacing Threshold Amplitude: 0.5 V
Lead Channel Pacing Threshold Amplitude: 0.75 V
Lead Channel Pacing Threshold Pulse Width: 0.4 ms
Lead Channel Pacing Threshold Pulse Width: 0.4 ms
Lead Channel Sensing Intrinsic Amplitude: 2.3 mV
Lead Channel Sensing Intrinsic Amplitude: 7.3 mV
Lead Channel Setting Pacing Amplitude: 2.5 V
Lead Channel Setting Pacing Amplitude: 2.5 V
Lead Channel Setting Pacing Pulse Width: 0.4 ms
Lead Channel Setting Sensing Sensitivity: 2 mV
Pulse Gen Model: 2272
Pulse Gen Serial Number: 7944486

## 2020-06-20 NOTE — Progress Notes (Signed)
Remote pacemaker transmission.   

## 2020-09-04 ENCOUNTER — Other Ambulatory Visit: Payer: Self-pay | Admitting: Medical

## 2020-09-12 ENCOUNTER — Telehealth: Payer: Self-pay

## 2020-09-12 NOTE — Telephone Encounter (Signed)
Disp. Time Eilene Ghazi Time) Disposition Final User 09/10/2020 2:49:04 PM Attempt made - no message left Valentino Nose, RN, [place] 09/10/2020 3:06:48 PM FINAL ATTEMPT MADE - no message left Earley Brooke, [place] 09/10/2020 3:06:55 PM Send to RN Final Attempt Sharyn Blitz, RN, Marye Round 09/10/2020 4:24:55 PM Attempt made - message left Kathaleen Bury 09/10/2020 4:34:36 PM FINAL ATTEMPT MADE - message left

## 2020-09-12 NOTE — Telephone Encounter (Signed)
This message is not very helpful as no report as to number of days pt has been  constipated from caller. Pt would benefit from in office appointment. If abdomen pain or vomiting with constipation then ED evaluation.  Please find out how many days she has been constipated.

## 2020-09-12 NOTE — Telephone Encounter (Signed)
Nurse Assessment Nurse: Kathi Ludwig, RN, Leana Roe Date/Time Tracey Holt Time): 09/10/2020 1:20:36 PM Confirm and document reason for call. If symptomatic, describe symptoms. ---Caller states she took an enema twice and nothing came out. Caller states [name] is at independent living and will let her son know she called,  Does the patient have any new or worsening symptoms? ---Yes Will a triage be completed? ---No Select reason for no triage. ---Other Please document clinical information provided and list any resource used. ---message taken by facility Disp. Time Tracey Holt Time) Disposition Final User 09/10/2020 1:22:05 PM Clinical Call Yes Kathi Ludwig, RN, Tressia Miners

## 2020-09-13 NOTE — Telephone Encounter (Signed)
Patient states she is having constipation every once a month .  Enema did work , and last BM was yesterday , and no abd. Pain .Marland Kitchen   Phone call transferred to edward after talking to her.

## 2020-09-14 ENCOUNTER — Ambulatory Visit: Payer: Medicare HMO | Admitting: Medical

## 2020-09-15 ENCOUNTER — Ambulatory Visit (INDEPENDENT_AMBULATORY_CARE_PROVIDER_SITE_OTHER): Payer: Medicare HMO

## 2020-09-15 DIAGNOSIS — I442 Atrioventricular block, complete: Secondary | ICD-10-CM | POA: Diagnosis not present

## 2020-09-15 LAB — CUP PACEART REMOTE DEVICE CHECK
Battery Remaining Longevity: 89 mo
Battery Remaining Percentage: 95.5 %
Battery Voltage: 2.96 V
Brady Statistic AP VP Percent: 37 %
Brady Statistic AP VS Percent: 1 %
Brady Statistic AS VP Percent: 63 %
Brady Statistic AS VS Percent: 1 %
Brady Statistic RA Percent Paced: 37 %
Brady Statistic RV Percent Paced: 99 %
Date Time Interrogation Session: 20211028020015
Implantable Lead Implant Date: 20170907
Implantable Lead Implant Date: 20170907
Implantable Lead Location: 753859
Implantable Lead Location: 753860
Implantable Pulse Generator Implant Date: 20170907
Lead Channel Impedance Value: 350 Ohm
Lead Channel Impedance Value: 400 Ohm
Lead Channel Pacing Threshold Amplitude: 0.5 V
Lead Channel Pacing Threshold Amplitude: 0.75 V
Lead Channel Pacing Threshold Pulse Width: 0.4 ms
Lead Channel Pacing Threshold Pulse Width: 0.4 ms
Lead Channel Sensing Intrinsic Amplitude: 2.9 mV
Lead Channel Sensing Intrinsic Amplitude: 7.3 mV
Lead Channel Setting Pacing Amplitude: 2.5 V
Lead Channel Setting Pacing Amplitude: 2.5 V
Lead Channel Setting Pacing Pulse Width: 0.4 ms
Lead Channel Setting Sensing Sensitivity: 2 mV
Pulse Gen Model: 2272
Pulse Gen Serial Number: 7944486

## 2020-09-16 ENCOUNTER — Ambulatory Visit (HOSPITAL_BASED_OUTPATIENT_CLINIC_OR_DEPARTMENT_OTHER)
Admission: RE | Admit: 2020-09-16 | Discharge: 2020-09-16 | Disposition: A | Discharge status: Home / Self Care | Payer: Medicare HMO | Source: Ambulatory Visit | Attending: Medical | Admitting: Medical

## 2020-09-16 ENCOUNTER — Ambulatory Visit (INDEPENDENT_AMBULATORY_CARE_PROVIDER_SITE_OTHER): Payer: Medicare HMO | Admitting: Medical

## 2020-09-16 ENCOUNTER — Other Ambulatory Visit: Payer: Self-pay

## 2020-09-16 VITALS — BP 150/75 | HR 76 | Temp 98.1°F | Ht <= 58 in | Wt 94.0 lb

## 2020-09-16 DIAGNOSIS — K5900 Constipation, unspecified: Secondary | ICD-10-CM

## 2020-09-16 NOTE — Patient Instructions (Addendum)
Recent constipation intermittent for one week.  Today as we approach weekend will get 1 view abdomen xray and decide on treatment.  If xray findings indicating bowel obstruction and if  CT recommended then will advise ED evaluation.  If stool present but no finding of obstruction would recommend magnesium citrate bottle oral solution. 1/2 bottle wait 4 hours to see if have bm. Then drink second half if no bm movement.  As we approach weekend if you try the above and not having any bm by tomorrow/worsening bloating then recommend ED evaluation.  Follow up in date to be determined after xray review.  Ask to give [place] update by my chart by tomorrow if had bm.   Note I did eventually get x-ray of abdomen back and it  only showed minimal stool burden.  So I did call patient and send MyChart message modifying the above plan.  Basically stated that today could try MiraLAX or Dulcolax to see if she could have a bowel movement.  Then wait and see how she does tomorrow.  If no bowel movement with worsening symptoms then at that point could start magnesium citrate tomorrow afternoon.

## 2020-09-16 NOTE — Progress Notes (Signed)
Subjective:    Patient ID: Tracey Holt, female    DOB: 22-Aug-1922, 86 y.o.   MRN: [id number]  HPI  Pt in with some recent intermittent episodes of bloating and constipation. Pt describes having to use miralax, mineral and occasional glycerin suppository. Pm review sounds like no bm since Tuesday.   No nausea or vomiting.  Pt told me she had bm on Tuesday when I talked to her on Wednesday.  Pt son thinks she is staying dehydrated.She is not sure if she had bowel movement.  No abdomen surgeries.  No hx of bowel obstruction    Review of Systems  Constitutional: Negative for chills, fatigue and fever.  Respiratory: Negative for cough, chest tightness and wheezing.   Cardiovascular: Negative for chest pain and palpitations.  Gastrointestinal: Positive for abdominal pain and constipation. Negative for blood in stool and nausea.  Genitourinary: Negative for dysuria and hematuria.  Musculoskeletal: Negative for back pain.  Skin: Negative for rash.  Neurological: Negative for dizziness, seizures, syncope, weakness and headaches.  Hematological: Negative for adenopathy. Does not bruise/bleed easily.  Psychiatric/Behavioral: Negative for behavioral problems, confusion, dysphoric mood and hallucinations. The patient is not nervous/anxious.     Past Medical History:  Diagnosis Date  . Bradycardic cardiac arrest ([place]) 07/24/2016   Required CPR, intubation with multiple rib fractures. Had short term cardiac shock with acute heart failure.  Resulted in possible stress-induced cardiomyopathy  . Cardiac related syncope 07/24/2016   Bradycardic arrest requiring CPR 2 with transvenous pacemaker placed followed by permanent pacemaker; complicated by respiratory arrest, type II MI, stress-induced cardiopathy  . Cardiomyopathy ([place]) 07/2016   Moderate severely reduced EF with apical hypokinesis suggestive of stress-induced cardiac myopathy versus multivessel CAD --> in setting of her to cardiac  arrest  . COPD (chronic obstructive pulmonary disease) ([place])    By report  . History of chronic constipation   . Hypercholesterolemia   . Osteoporosis   . Pacemaker 07/26/2016   Abbott dual-chamber pacemaker: [place] #[id number] - Serial [id number]  . Pneumonia 08/2016  . Sarcoidosis of lung ([place])   . Spinal stenosis      Social History   Socioeconomic History  . Marital status: Widowed    Spouse name: Not on file  . Number of children: Not on file  . Years of education: Not on file  . Highest education level: Not on file  Occupational History  . Not on file  Tobacco Use  . Smoking status: Former Smoker    Packs/day: 1.50    Years: 17.00    Pack years: 25.50    Types: Cigarettes    Start date: 12/18/1963    Quit date: 11/20/1983    Years since quitting: 36.8  . Smokeless tobacco: Never Used  Vaping Use  . Vaping Use: Never used  Substance and Sexual Activity  . Alcohol use: No  . Drug use: No  . Sexual activity: Not on file  Other Topics Concern  . Not on file  Social History Narrative   Recently moved to [place] from [place] to live near her daughter.   Social Determinants of Health   Financial Resource Strain:   . Difficulty of Paying Living Expenses: Not on file  Food Insecurity:   . Worried About Charity fundraiser in the Last Year: Not on file  . Ran Out of Food in the Last Year: Not on file  Transportation Needs:   . Lack of Transportation (Medical): Not on file  .  Lack of Transportation (Non-Medical): Not on file  Physical Activity:   . Days of Exercise per Week: Not on file  . Minutes of Exercise per Session: Not on file  Stress:   . Feeling of Stress : Not on file  Social Connections:   . Frequency of Communication with Friends and Family: Not on file  . Frequency of Social Gatherings with Friends and Family: Not on file  . Attends Religious Services: Not on file  . Active Member of Clubs or Organizations: Not on file  .  Attends Archivist Meetings: Not on file  . Marital Status: Not on file  Intimate Partner Violence:   . Fear of Current or Ex-Partner: Not on file  . Emotionally Abused: Not on file  . Physically Abused: Not on file  . Sexually Abused: Not on file    Past Surgical History:  Procedure Laterality Date  . ADENOIDECTOMY    . PACEMAKER INSERTION Left 07/26/2016   Abbott Assurity Model #[id number] - Serial #[id number] -- complicated by pneumothorax requiring chest tube placement  . TEE WITHOUT CARDIOVERSION  02/2013   EF 55-60% with normal global function. No thrombus, mass or vegetation. Trivial-small pericardial effusion. Moderate LA dilation. Mild MR.  . TONSILLECTOMY    . TRANSTHORACIC ECHOCARDIOGRAM  07/24/2016   [facility]: EF 45-50%. Mid and apical inferior, inferolateral and apical hypokinesis. GR 2 DD. Mild LA and moderate RA dilation. Mild paracardial effusion. Moderate to severe TR. Basal and mid segment hypokinesis with apical hypokinesis. - Suspect stress-induced cardiopathy versus multivessel CAD.  Marland Kitchen TRANSTHORACIC ECHOCARDIOGRAM  07/26/2016   [facility]: LIMITED (post PPM) - although the report suggested EF 30-35% with apical akinesis, rounding note suggested this was an improvement    Family History  Problem Relation Age of Onset  . Allergic rhinitis Neg Hx   . Angioedema Neg Hx   . Asthma Neg Hx   . Eczema Neg Hx   . Immunodeficiency Neg Hx   . Urticaria Neg Hx     Allergies  Allergen Reactions  . Dust Mite Extract Shortness Of Breath  . Levaquin [Levofloxacin In D5w] Shortness Of Breath  . Lidex [Fluocinonide] Swelling    Caused lip swelling  . Molds & Smuts Shortness Of Breath  . Rifampin Shortness Of Breath  . Lactose Other (See Comments)    Unknown rxn per pt  . Penicillins Swelling    Did it involve swelling of the face/tongue/throat, SOB, or low BP? No Did it involve sudden or severe rash/hives, skin  peeling, or any reaction on the inside of your mouth or nose? Unknown Did you need to seek medical attention at a hospital or doctor's office? Unknown When did it last happen?Unknown If all above answers are "NO", may proceed with cephalosporin use.     Current Outpatient Medications on File Prior to Visit  Medication Sig Dispense Refill  . Ascorbic Acid (VITAMIN C) 1000 MG tablet Take 1,000 mg by mouth daily.    Marland Kitchen atorvastatin (LIPITOR) 10 MG tablet TAKE 1 TABLET BY MOUTH EVERY DAY 90 tablet 0  . azelastine (ASTELIN) 0.1 % nasal spray Place 2 sprays into both nostrils 2 (two) times daily. Use in each nostril as directed 30 mL 12  . Biotin 10000 MCG TABS Take 10,000 mcg by mouth daily.     Marland Kitchen docusate sodium (COLACE) 100 MG capsule Take 100 mg by mouth daily as needed for mild constipation.     Marland Kitchen guaiFENesin-dextromethorphan (  ROBITUSSIN DM) 100-10 MG/5ML syrup Take 5 mLs by mouth every 4 (four) hours as needed (chest congestion). 118 mL 0  . hydroxypropyl methylcellulose / hypromellose (ISOPTO TEARS / GONIOVISC) 2.5 % ophthalmic solution Place 1 drop into both eyes daily.     Marland Kitchen levocetirizine (XYZAL) 5 MG tablet TAKE 1 TABLET BY MOUTH EVERY DAY IN THE EVENING (Patient taking differently: Take 5 mg by mouth every evening. ) 90 tablet 0  . Multiple Vitamins-Minerals (MULTIVITAMIN WITH MINERALS) tablet Take 1 tablet by mouth daily.    Marland Kitchen PROAIR HFA 108 (90 Base) MCG/ACT inhaler Inhale 2 puffs into the lungs every 6 (six) hours as needed for wheezing or shortness of breath. 18 g 5  . amLODipine (NORVASC) 2.5 MG tablet TAKE 1 TABLET BY MOUTH EVERY DAY (Patient not taking: Reported on 09/16/2020) 90 tablet 1  . azithromycin (ZITHROMAX) 250 MG tablet Take 2 tablets by mouth first day, then 1 tablet for 4 additional days (Patient not taking: Reported on 04/16/2020) 6 tablet 0  . benzonatate (TESSALON) 100 MG capsule Take 1 capsule (100 mg total) by mouth 3 (three) times daily as needed for cough. (Patient  not taking: Reported on 02/16/2020) 30 capsule 0  . ipratropium (ATROVENT HFA) 17 MCG/ACT inhaler Inhale 2 puffs into the lungs every 6 (six) hours as needed for wheezing. (Patient not taking: Reported on 01/02/2019) 1 Inhaler 2  . pantoprazole (PROTONIX) 40 MG tablet Take 1 tablet (40 mg total) by mouth daily. (Patient not taking: Reported on 02/16/2020) 30 tablet 1   No current facility-administered medications on file prior to visit.    BP (!) 150/75   Pulse 76   Temp 98.1 F (36.7 C) (Oral)   Ht 4\' 6"  (1.372 m)   Wt 94 lb (42.6 kg)   SpO2 92%   BMI 22.66 kg/m       Objective:   Physical Exam  General Appearance- Not in acute distress.  HEENT Eyes- Scleraeral/Conjuntiva-bilat- Not Yellow. Mouth & Throat- Normal.  Chest and Lung Exam Auscultation: Breath sounds:-Normal. Adventitious sounds:- No Adventitious sounds.  Cardiovascular Auscultation:Rythm - Regular. Heart Sounds -Normal heart sounds.  Abdomen Inspection:-Inspection Normal.  Palpation/Perucssion: Palpation and Percussion of the abdomen reveal- mild Tender, No Rebound tenderness, No rigidity(Guarding) and No Palpable abdominal masses. Does feel distended. Liver:-Normal.  Spleen:- Normal.   .      Assessment & Plan:  Recent constipation intermittent for one week.  Today as we approach weekend will get 1 view abdomen xray and decide on treatment.  If xray findings indicating bowel obstruction and if  CT recommended then will advise ED evaluation.  If stool present but no finding of obstruction would recommend magnesium citrate bottle oral solution. 1/2 bottle wait 4 hours to see if have bm. Then drink second half if no bm movement.  As we approach weekend if you try the above and not having any bm by tomorrow/worsening bloating then recommend ED evaluation.  Follow up in date to be determined after xray review.  Ask to give [place] update by my chart by tomorrow if had bm.  Note I did eventually get x-ray  of abdomen back and it  only showed minimal stool burden.  So I did call patient and send MyChart message modifying the above plan.  Basically stated that today could try MiraLAX or Dulcolax to see if she could have a bowel movement.  Then wait and see how she does tomorrow.  If no bowel movement with worsening symptoms  then at that point could start magnesium citrate tomorrow afternoon.  Time spent with patient today was  30 minutes which consisted of chart revdiew, discussing diagnosis, work up treatment and documentation.

## 2020-09-17 ENCOUNTER — Encounter: Payer: Self-pay | Admitting: Medical

## 2020-09-20 NOTE — Progress Notes (Signed)
Remote pacemaker transmission.   

## 2020-10-03 ENCOUNTER — Other Ambulatory Visit: Payer: Self-pay | Admitting: Medical

## 2020-10-05 ENCOUNTER — Other Ambulatory Visit: Payer: Self-pay | Admitting: Medical

## 2020-12-15 ENCOUNTER — Ambulatory Visit (INDEPENDENT_AMBULATORY_CARE_PROVIDER_SITE_OTHER): Payer: Medicare HMO

## 2020-12-15 DIAGNOSIS — I442 Atrioventricular block, complete: Secondary | ICD-10-CM

## 2020-12-16 LAB — CUP PACEART REMOTE DEVICE CHECK
Battery Remaining Longevity: 88 mo
Battery Remaining Percentage: 95.5 %
Battery Voltage: 2.96 V
Brady Statistic AP VP Percent: 36 %
Brady Statistic AP VS Percent: 1 %
Brady Statistic AS VP Percent: 64 %
Brady Statistic AS VS Percent: 1 %
Brady Statistic RA Percent Paced: 36 %
Brady Statistic RV Percent Paced: 99 %
Date Time Interrogation Session: 20220127020022
Implantable Lead Implant Date: 20170907
Implantable Lead Implant Date: 20170907
Implantable Lead Location: 753859
Implantable Lead Location: 753860
Implantable Pulse Generator Implant Date: 20170907
Lead Channel Impedance Value: 350 Ohm
Lead Channel Impedance Value: 360 Ohm
Lead Channel Pacing Threshold Amplitude: 0.5 V
Lead Channel Pacing Threshold Amplitude: 0.75 V
Lead Channel Pacing Threshold Pulse Width: 0.4 ms
Lead Channel Pacing Threshold Pulse Width: 0.4 ms
Lead Channel Sensing Intrinsic Amplitude: 3 mV
Lead Channel Sensing Intrinsic Amplitude: 7.3 mV
Lead Channel Setting Pacing Amplitude: 2.5 V
Lead Channel Setting Pacing Amplitude: 2.5 V
Lead Channel Setting Pacing Pulse Width: 0.4 ms
Lead Channel Setting Sensing Sensitivity: 2 mV
Pulse Gen Model: 2272
Pulse Gen Serial Number: 7944486

## 2020-12-26 NOTE — Progress Notes (Signed)
Remote pacemaker transmission.   

## 2021-02-27 ENCOUNTER — Other Ambulatory Visit: Payer: Self-pay | Admitting: Medical

## 2021-03-16 ENCOUNTER — Ambulatory Visit (INDEPENDENT_AMBULATORY_CARE_PROVIDER_SITE_OTHER): Payer: Medicare HMO

## 2021-03-16 DIAGNOSIS — I442 Atrioventricular block, complete: Secondary | ICD-10-CM | POA: Diagnosis not present

## 2021-03-16 LAB — CUP PACEART REMOTE DEVICE CHECK
Battery Remaining Longevity: 79 mo
Battery Remaining Percentage: 89 %
Battery Voltage: 2.95 V
Brady Statistic AP VP Percent: 35 %
Brady Statistic AP VS Percent: 1 %
Brady Statistic AS VP Percent: 65 %
Brady Statistic AS VS Percent: 1 %
Brady Statistic RA Percent Paced: 35 %
Brady Statistic RV Percent Paced: 99 %
Date Time Interrogation Session: 20220428020015
Implantable Lead Implant Date: 20170907
Implantable Lead Implant Date: 20170907
Implantable Lead Location: 753859
Implantable Lead Location: 753860
Implantable Pulse Generator Implant Date: 20170907
Lead Channel Impedance Value: 330 Ohm
Lead Channel Impedance Value: 360 Ohm
Lead Channel Pacing Threshold Amplitude: 0.5 V
Lead Channel Pacing Threshold Amplitude: 0.75 V
Lead Channel Pacing Threshold Pulse Width: 0.4 ms
Lead Channel Pacing Threshold Pulse Width: 0.4 ms
Lead Channel Sensing Intrinsic Amplitude: 3.1 mV
Lead Channel Sensing Intrinsic Amplitude: 7.3 mV
Lead Channel Setting Pacing Amplitude: 2.5 V
Lead Channel Setting Pacing Amplitude: 2.5 V
Lead Channel Setting Pacing Pulse Width: 0.4 ms
Lead Channel Setting Sensing Sensitivity: 2 mV
Pulse Gen Model: 2272
Pulse Gen Serial Number: 7944486

## 2021-04-06 NOTE — Progress Notes (Signed)
Remote pacemaker transmission.   

## 2021-04-10 ENCOUNTER — Other Ambulatory Visit: Payer: Self-pay | Admitting: Medical

## 2021-04-19 ENCOUNTER — Telehealth: Payer: Self-pay | Admitting: Medical

## 2021-04-19 NOTE — Telephone Encounter (Signed)
The patient's son reports that his mother has been extremely tired and fatigued and that she tested negative for COVID. I offered our next available appointment on Friday, and the patient declined. Son insists on sending a message to the nurse, confident that [facility] will accommodate her.

## 2021-04-19 NOTE — Telephone Encounter (Signed)
Pt has appointment scheduled for tomorrow at 3;40

## 2021-04-19 NOTE — Telephone Encounter (Signed)
With that description and her age it sounds like needs extensive work up. Stat labs, urinalysis, chest xray and maybe IV hydration if dehydrated. Maybe covid test as well. Recommend ED evaluation.

## 2021-04-19 NOTE — Telephone Encounter (Signed)
Spoke with son states Tracey Holt is just tired/fatigue , laying around , barely talking and eating, and slightly pale .Marland Kitchen no URI symptoms , offered virtual and son declined, states wants [facility]'s opinion

## 2021-04-20 ENCOUNTER — Telehealth: Payer: Self-pay | Admitting: Medical

## 2021-04-20 NOTE — Telephone Encounter (Signed)
Did you advise pt to be seen at ED as I had asked? With her symptoms and her age very likely won't be able to do much for her Frriday afternoon. She will need stat labs and UA.   Since she is scheuled late coordinate with me. Make sure if I am running behind that I am aware she is waiting. Will need her to get to lab before it closes. Will decide on lab after talking with her.

## 2021-04-21 ENCOUNTER — Ambulatory Visit (INDEPENDENT_AMBULATORY_CARE_PROVIDER_SITE_OTHER): Payer: Medicare HMO | Admitting: Medical

## 2021-04-21 ENCOUNTER — Other Ambulatory Visit: Payer: Self-pay

## 2021-04-21 VITALS — BP 158/77 | HR 56 | Temp 98.3°F | Resp 18 | Ht <= 58 in | Wt 91.2 lb

## 2021-04-21 DIAGNOSIS — R5383 Other fatigue: Secondary | ICD-10-CM | POA: Diagnosis not present

## 2021-04-21 DIAGNOSIS — Z87440 Personal history of urinary (tract) infections: Secondary | ICD-10-CM

## 2021-04-21 LAB — COMPREHENSIVE METABOLIC PANEL
AG Ratio: 1.7 (calc) (ref 1.0–2.5)
ALT: 17 U/L (ref 6–29)
AST: 20 U/L (ref 10–35)
Albumin: 4 g/dL (ref 3.6–5.1)
Alkaline phosphatase (APISO): 48 U/L (ref 37–153)
BUN/Creatinine Ratio: 26 (calc) — ABNORMAL HIGH (ref 6–22)
BUN: 15 mg/dL (ref 7–25)
CO2: 33 mmol/L — ABNORMAL HIGH (ref 20–32)
Calcium: 9.5 mg/dL (ref 8.6–10.4)
Chloride: 104 mmol/L (ref 98–110)
Creat: 0.57 mg/dL — ABNORMAL LOW (ref 0.60–0.88)
Globulin: 2.4 g/dL (calc) (ref 1.9–3.7)
Glucose, Bld: 82 mg/dL (ref 65–99)
Potassium: 4.7 mmol/L (ref 3.5–5.3)
Sodium: 142 mmol/L (ref 135–146)
Total Bilirubin: 0.6 mg/dL (ref 0.2–1.2)
Total Protein: 6.4 g/dL (ref 6.1–8.1)

## 2021-04-21 LAB — POC URINALSYSI DIPSTICK (AUTOMATED)
Bilirubin, UA: NEGATIVE
Blood, UA: NEGATIVE
Glucose, UA: NEGATIVE
Ketones, UA: NEGATIVE
Leukocytes, UA: NEGATIVE
Nitrite, UA: NEGATIVE
Protein, UA: POSITIVE — AB
Spec Grav, UA: 1.015 (ref 1.010–1.025)
Urobilinogen, UA: 0.2 E.U./dL
pH, UA: 6 (ref 5.0–8.0)

## 2021-04-21 LAB — CBC WITH DIFFERENTIAL/PLATELET
Absolute Monocytes: 627 cells/uL (ref 200–950)
Basophils Absolute: 29 cells/uL (ref 0–200)
Basophils Relative: 0.5 %
Eosinophils Absolute: 40 cells/uL (ref 15–500)
Eosinophils Relative: 0.7 %
HCT: 38.1 % (ref 35.0–45.0)
Hemoglobin: 12.2 g/dL (ref 11.7–15.5)
Lymphs Abs: 1226 cells/uL (ref 850–3900)
MCH: 27.7 pg (ref 27.0–33.0)
MCHC: 32 g/dL (ref 32.0–36.0)
MCV: 86.4 fL (ref 80.0–100.0)
MPV: 9.8 fL (ref 7.5–12.5)
Monocytes Relative: 11 %
Neutro Abs: 3779 cells/uL (ref 1500–7800)
Neutrophils Relative %: 66.3 %
Platelets: 246 10*3/uL (ref 140–400)
RBC: 4.41 10*6/uL (ref 3.80–5.10)
RDW: 13.1 % (ref 11.0–15.0)
Total Lymphocyte: 21.5 %
WBC: 5.7 10*3/uL (ref 3.8–10.8)

## 2021-04-21 LAB — IRON: Iron: 55 ug/dL (ref 45–160)

## 2021-04-21 NOTE — Progress Notes (Signed)
Subjective:    Patient ID: Tracey Holt, female    DOB: Mar 19, 1922, 87 y.o.   MRN: [id number]  HPI  Pt in for follow up.  Pt son got call that Tracey Holt was very tired and could not get out of bed. She started to eat and hydrate better. She gradually got better energy wise. By Wednesday she felt better. Son thinks she is not eating much. She often skips breakfast. Has never been a big eater. No fever, no chills, no sweats, no cough and no pain on urination.   Pt denies any depression.   No nausea, no vomiting and no abdomen pain.     Pt had covid test on Monday and study was negative.   Review of Systems  Constitutional: Negative for chills, fatigue and fever.  HENT: Negative for congestion, drooling and ear discharge.   Respiratory: Negative for cough, chest tightness, shortness of breath and wheezing.   Cardiovascular: Negative for chest pain and palpitations.  Gastrointestinal: Negative for abdominal pain.  Musculoskeletal: Negative for joint swelling and myalgias.  Skin: Negative for rash.  Neurological: Negative for dizziness, speech difficulty, weakness and headaches.  Hematological: Negative for adenopathy. Does not bruise/bleed easily.  Psychiatric/Behavioral: Negative for behavioral problems and confusion.    Past Medical History:  Diagnosis Date  . Bradycardic cardiac arrest ([place]) 07/24/2016   Required CPR, intubation with multiple rib fractures. Had short term cardiac shock with acute heart failure.  Resulted in possible stress-induced cardiomyopathy  . Cardiac related syncope 07/24/2016   Bradycardic arrest requiring CPR 2 with transvenous pacemaker placed followed by permanent pacemaker; complicated by respiratory arrest, type II MI, stress-induced cardiopathy  . Cardiomyopathy ([place]) 07/2016   Moderate severely reduced EF with apical hypokinesis suggestive of stress-induced cardiac myopathy versus multivessel CAD --> in setting of her to cardiac arrest  . COPD  (chronic obstructive pulmonary disease) ([place])    By report  . History of chronic constipation   . Hypercholesterolemia   . Osteoporosis   . Pacemaker 07/26/2016   Abbott dual-chamber pacemaker: [place] #[id number] - Serial [id number]  . Pneumonia 08/2016  . Sarcoidosis of lung ([place])   . Spinal stenosis      Social History   Socioeconomic History  . Marital status: Widowed    Spouse name: Not on file  . Number of children: Not on file  . Years of education: Not on file  . Highest education level: Not on file  Occupational History  . Not on file  Tobacco Use  . Smoking status: Former Smoker    Packs/day: 1.50    Years: 17.00    Pack years: 25.50    Types: Cigarettes    Start date: 12/18/1963    Quit date: 11/20/1983    Years since quitting: 37.4  . Smokeless tobacco: Never Used  Vaping Use  . Vaping Use: Never used  Substance and Sexual Activity  . Alcohol use: No  . Drug use: No  . Sexual activity: Not on file  Other Topics Concern  . Not on file  Social History Narrative   Recently moved to [place] from [place] to live near her daughter.   Social Determinants of Health   Financial Resource Strain: Not on file  Food Insecurity: Not on file  Transportation Needs: Not on file  Physical Activity: Not on file  Stress: Not on file  Social Connections: Not on file  Intimate Partner Violence: Not on file    Past Surgical History:  Procedure Laterality Date  . ADENOIDECTOMY    . PACEMAKER INSERTION Left 07/26/2016   Abbott Assurity Model #[id number] - Serial #[id number] -- complicated by pneumothorax requiring chest tube placement  . TEE WITHOUT CARDIOVERSION  02/2013   EF 55-60% with normal global function. No thrombus, mass or vegetation. Trivial-small pericardial effusion. Moderate LA dilation. Mild MR.  . TONSILLECTOMY    . TRANSTHORACIC ECHOCARDIOGRAM  07/24/2016   [facility]: EF 45-50%. Mid and apical inferior,  inferolateral and apical hypokinesis. GR 2 DD. Mild LA and moderate RA dilation. Mild paracardial effusion. Moderate to severe TR. Basal and mid segment hypokinesis with apical hypokinesis. - Suspect stress-induced cardiopathy versus multivessel CAD.  Marland Kitchen TRANSTHORACIC ECHOCARDIOGRAM  07/26/2016   [place]: LIMITED (post PPM) - although the report suggested EF 30-35% with apical akinesis, rounding note suggested this was an improvement    Family History  Problem Relation Age of Onset  . Allergic rhinitis Neg Hx   . Angioedema Neg Hx   . Asthma Neg Hx   . Eczema Neg Hx   . Immunodeficiency Neg Hx   . Urticaria Neg Hx     Allergies  Allergen Reactions  . Dust Mite Extract Shortness Of Breath  . Levaquin [Levofloxacin In D5w] Shortness Of Breath  . Lidex [Fluocinonide] Swelling    Caused lip swelling  . Molds & Smuts Shortness Of Breath  . Rifampin Shortness Of Breath  . Lactose Other (See Comments)    Unknown rxn per pt  . Penicillins Swelling    Did it involve swelling of the face/tongue/throat, SOB, or low BP? No Did it involve sudden or severe rash/hives, skin peeling, or any reaction on the inside of your mouth or nose? Unknown Did you need to seek medical attention at a hospital or doctor's office? Unknown When did it last happen?Unknown If all above answers are "NO", may proceed with cephalosporin use.     Current Outpatient Medications on File Prior to Visit  Medication Sig Dispense Refill  . amLODipine (NORVASC) 2.5 MG tablet TAKE 1 TABLET BY MOUTH EVERY DAY 90 tablet 1  . Ascorbic Acid (VITAMIN C) 1000 MG tablet Take 1,000 mg by mouth daily.    Marland Kitchen atorvastatin (LIPITOR) 10 MG tablet TAKE 1 TABLET BY MOUTH EVERY DAY 90 tablet 0  . azelastine (ASTELIN) 0.1 % nasal spray Place 2 sprays into both nostrils 2 (two) times daily. Use in each nostril as directed 30 mL 12  . Biotin 10000 MCG TABS Take 10,000 mcg by mouth daily.     Marland Kitchen docusate sodium  (COLACE) 100 MG capsule Take 100 mg by mouth daily as needed for mild constipation.     . hydroxypropyl methylcellulose / hypromellose (ISOPTO TEARS / GONIOVISC) 2.5 % ophthalmic solution Place 1 drop into both eyes daily.    Marland Kitchen levocetirizine (XYZAL) 5 MG tablet TAKE 1 TABLET BY MOUTH EVERY DAY IN THE EVENING (Patient taking differently: Take 5 mg by mouth every evening.) 90 tablet 0  . Multiple Vitamins-Minerals (MULTIVITAMIN WITH MINERALS) tablet Take 1 tablet by mouth daily.     No current facility-administered medications on file prior to visit.    BP (!) 158/77   Pulse (!) 56   Temp 98.3 F (36.8 C)   Resp 18   Ht 4\' 6"  (1.372 m)   Wt 91 lb 3.2 oz (41.4 kg)   SpO2 97%   BMI 21.99 kg/m       Objective:   Physical  Exam  General- No acute distress. Pleasant patient. Neck- Full range of motion, no jvd Lungs- Clear, even and unlabored. Heart- regular rate and rhythm. Neurologic- CNII- XII grossly intact.  Abdmen- soft, nt, nd, +bs. No rebound or guarding.   Lower ext- no pedal edema.       Assessment & Plan:  Your fatigue is much improved/resolved since Wednesday. Recommend hydrate well and make sure eating 2-3 meals a day. Can supplement hydration with propel. Can also use boost for calorie supplementation. Want to avoid dehydration and malnutirion.  Get cbc, cmp, iron and urine culture.  If gets any any cough recommend cxr.  If symptoms occur again as on Monday get rapid covid test otc. If + notify me immediately.  Follow up date to be determined after lab review.  Mackie Pai, PA-C   Time spent with patient today was 31  minutes which consisted of chart review, discussing diagnosis, work u,p treatment and documentation.

## 2021-04-21 NOTE — Telephone Encounter (Signed)
Patient's son declined ED visit again , and wants to come in earlier to get blood work for mom and states she is doing better than before. Can you drop lab orders

## 2021-04-21 NOTE — Patient Instructions (Signed)
Your fatigue is much improved/resolved since Wednesday. Recommend hydrate well and make sure eating 2-3 meals a day. Can supplement hydration with propel. Can also use boost for calorie supplementation. Want to avoid dehydration and malnutirion.  Get cbc, cmp, iron and urine culture.  If gets any any cough recommend cxr.  If symptoms occur again as on Monday get rapid covid test otc. If + notify me immediately.  Follow up date to be determined after lab review.

## 2021-04-22 LAB — URINE CULTURE
MICRO NUMBER:: 11966487
SPECIMEN QUALITY:: ADEQUATE

## 2021-05-30 ENCOUNTER — Other Ambulatory Visit: Payer: Self-pay

## 2021-05-30 ENCOUNTER — Ambulatory Visit (INDEPENDENT_AMBULATORY_CARE_PROVIDER_SITE_OTHER): Payer: Medicare HMO | Admitting: Family

## 2021-05-30 VITALS — BP 118/60 | HR 86 | Temp 98.7°F | Ht <= 58 in | Wt 93.6 lb

## 2021-05-30 DIAGNOSIS — R3989 Other symptoms and signs involving the genitourinary system: Secondary | ICD-10-CM

## 2021-05-30 DIAGNOSIS — R413 Other amnesia: Secondary | ICD-10-CM | POA: Diagnosis not present

## 2021-05-30 DIAGNOSIS — R410 Disorientation, unspecified: Secondary | ICD-10-CM | POA: Diagnosis not present

## 2021-05-30 LAB — POCT URINALYSIS DIP (CLINITEK)
Bilirubin, UA: NEGATIVE
Glucose, UA: NEGATIVE mg/dL
Ketones, POC UA: NEGATIVE mg/dL
Nitrite, UA: POSITIVE — AB
Spec Grav, UA: 1.015 (ref 1.010–1.025)
Urobilinogen, UA: 0.2 E.U./dL
pH, UA: 6 (ref 5.0–8.0)

## 2021-05-30 MED ORDER — NITROFURANTOIN MONOHYD MACRO 100 MG PO CAPS: 100.0000 mg | ORAL_CAPSULE | Freq: Two times a day (BID) | ORAL | 0 refills | Status: DC

## 2021-05-30 NOTE — Progress Notes (Signed)
Tracey Holt is a 87 y.o. female with the following history as recorded in EpicCare:  Patient Active Problem List   Diagnosis Date Noted   Acute respiratory failure with hypoxia ([place]) 01/02/2019   Constipation 01/02/2019   Hypokalemia 01/02/2019   Acute lower UTI 01/02/2019   Hyponatremia 12/23/2018   Disorientation    Hyponatremia syndrome 82/99/3716   Acute metabolic encephalopathy 96/78/9381   Aspiration pneumonia of both lower lobes due to gastric secretions ([place]) 08/13/2018   Intractable headache 04/07/2018   Abnormal CT of the abdomen 04/07/2018   CHB (complete heart block) ([place]) 01/75/1025   Chronic systolic congestive heart failure ([place]) 12/20/2016   Angioedema 12/17/2016   Chronic rhinitis 12/17/2016   Dyspnea 11/09/2016   Upper airway cough syndrome 10/29/2016   Abnormal CT of the chest  c/w lipoid pna 10/29/2016   Toe ulcer, right ([place]) 10/17/2016   S/P placement of cardiac pacemaker 09/19/2016   History of syncope 09/19/2016   Bradycardic cardiac arrest ([place]) 07/24/2016    Current Outpatient Medications  Medication Sig Dispense Refill   amLODipine (NORVASC) 2.5 MG tablet TAKE 1 TABLET BY MOUTH EVERY DAY 90 tablet 1   Ascorbic Acid (VITAMIN C) 1000 MG tablet Take 1,000 mg by mouth daily.     atorvastatin (LIPITOR) 10 MG tablet TAKE 1 TABLET BY MOUTH EVERY DAY 90 tablet 0   azelastine (ASTELIN) 0.1 % nasal spray Place 2 sprays into both nostrils 2 (two) times daily. Use in each nostril as directed 30 mL 12   Biotin 10000 MCG TABS Take 10,000 mcg by mouth daily.      docusate sodium (COLACE) 100 MG capsule Take 100 mg by mouth daily as needed for mild constipation.      hydroxypropyl methylcellulose / hypromellose (ISOPTO TEARS / GONIOVISC) 2.5 % ophthalmic solution Place 1 drop into both eyes daily.     levocetirizine (XYZAL) 5 MG tablet TAKE 1 TABLET BY MOUTH EVERY DAY IN THE EVENING (Patient taking differently: Take 5 mg by mouth every evening.) 90 tablet 0    Multiple Vitamins-Minerals (MULTIVITAMIN WITH MINERALS) tablet Take 1 tablet by mouth daily.     nitrofurantoin, macrocrystal-monohydrate, (MACROBID) 100 MG capsule Take 1 capsule (100 mg total) by mouth 2 (two) times daily. 14 capsule 0   No current facility-administered medications for this visit.    Allergies: Dust mite extract, Levaquin [levofloxacin in d5w], Lidex [fluocinonide], Molds & smuts, Rifampin, Lactose, and Penicillins  Past Medical History:  Diagnosis Date   Bradycardic cardiac arrest ([place]) 07/24/2016   Required CPR, intubation with multiple rib fractures. Had short term cardiac shock with acute heart failure.  Resulted in possible stress-induced cardiomyopathy   Cardiac related syncope 07/24/2016   Bradycardic arrest requiring CPR 2 with transvenous pacemaker placed followed by permanent pacemaker; complicated by respiratory arrest, type II MI, stress-induced cardiopathy   Cardiomyopathy ([place]) 07/2016   Moderate severely reduced EF with apical hypokinesis suggestive of stress-induced cardiac myopathy versus multivessel CAD --> in setting of her to cardiac arrest   COPD (chronic obstructive pulmonary disease) ([place])    By report   History of chronic constipation    Hypercholesterolemia    Osteoporosis    Pacemaker 07/26/2016   Abbott dual-chamber pacemaker: Abbott Assurity Model #EN27782 - Serial #[id number]   Pneumonia 08/2016   Sarcoidosis of lung [facility])    Spinal stenosis     Past Surgical History:  Procedure Laterality Date   ADENOIDECTOMY     PACEMAKER INSERTION Left 07/26/2016  Abbott Assurity Model [id number] - Serial #[id number] -- complicated by pneumothorax requiring chest tube placement   TEE WITHOUT CARDIOVERSION  02/2013   EF 55-60% with normal global function. No thrombus, mass or vegetation. Trivial-small pericardial effusion. Moderate LA dilation. Mild MR.   TONSILLECTOMY     TRANSTHORACIC ECHOCARDIOGRAM  07/24/2016   [facility]: EF  45-50%. Mid and apical inferior, inferolateral and apical hypokinesis. GR 2 DD. Mild LA and moderate RA dilation. Mild paracardial effusion. Moderate to severe TR. Basal and mid segment hypokinesis with apical hypokinesis. - Suspect stress-induced cardiopathy versus multivessel CAD.   TRANSTHORACIC ECHOCARDIOGRAM  07/26/2016   [place]: LIMITED (post PPM) - although the report suggested EF 30-35% with apical akinesis, rounding note suggested this was an improvement    Family History  Problem Relation Age of Onset   Allergic rhinitis Neg Hx    Angioedema Neg Hx    Asthma Neg Hx    Eczema Neg Hx    Immunodeficiency Neg Hx    Urticaria Neg Hx     Social History   Tobacco Use   Smoking status: Former    Packs/day: 1.50    Years: 17.00    Pack years: 25.50    Types: Cigarettes    Start date: 12/18/1963    Quit date: 11/20/1983    Years since quitting: 37.5   Smokeless tobacco: Never  Substance Use Topics   Alcohol use: No    Subjective:  Very pleasant lady accompanied by her son who helps provide history; per son, he was asked to get his mother checked for UTI by [facility] where she [facility] a resident; increased confusion noted at facility for past few days; patient denies any burning on urination but does mention that she has been having a headache recently;     Objective:  Vitals:   05/30/21 1548  BP: 118/60  Pulse: 86  Temp: 98.7 F (37.1 C)  TempSrc: Oral  SpO2: 98%  Weight: 93 lb 9.6 oz (42.5 kg)  Height: 4\' 8"  (1.422 m)    General: Well developed, well nourished, in no acute distress  Skin : Warm and dry.  Head: Normocephalic and atraumatic  Lungs: Respirations unlabored; clear to auscultation bilaterally without wheeze, rales, rhonchi  CVS exam: normal rate and regular rhythm.  Neurologic: Alert; hard of hearing; speech intact; face symmetrical; moves all extremities well; CNII-XII intact without focal deficit   Assessment:  1. Suspected  UTI   2. Confused   3. Memory changes     Plan:  & 2. Check U/A and urine culture; Rx for Macrobid 100 mg bid x 7 days; follow up to be determined based on culture results; son is comfortable with being notified of results by [place]. Suspect acute changes notes recently due to UTI; son would like neurology consult for further testing- ? Anxiety vs dementia and would like to know what/ if any treatment options available. Referral done;  This visit occurred during the SARS-CoV-2 public health emergency.  Safety protocols were in place, including screening questions prior to the visit, additional usage of staff PPE, and extensive cleaning of exam room while observing appropriate contact time as indicated for disinfecting solutions.     No follow-ups on file.  Orders Placed This Encounter  Procedures   Urine Culture   Ambulatory referral to Neurology    Referral Priority:   Routine    Referral Type:   Consultation    Referral Reason:  Specialty Services Required    Requested Specialty:   Neurology    Number of Visits Requested:   1   POCT URINALYSIS DIP ([facility])    Requested Prescriptions   Signed Prescriptions Disp Refills   nitrofurantoin, macrocrystal-monohydrate, (MACROBID) 100 MG capsule 14 capsule 0    Sig: Take 1 capsule (100 mg total) by mouth 2 (two) times daily.

## 2021-05-31 ENCOUNTER — Other Ambulatory Visit: Payer: Self-pay | Admitting: Medical

## 2021-06-01 ENCOUNTER — Encounter: Payer: Self-pay | Admitting: Family

## 2021-06-01 LAB — URINE CULTURE
MICRO NUMBER:: 12109054
SPECIMEN QUALITY:: ADEQUATE

## 2021-06-13 ENCOUNTER — Ambulatory Visit: Payer: Medicare HMO | Admitting: Physician Assistant

## 2021-06-13 ENCOUNTER — Telehealth: Payer: Self-pay | Admitting: Medical

## 2021-06-13 NOTE — Telephone Encounter (Signed)
Son states patient has another UTI.Marland Kitchen He would like some antibiotics. He would like to speak to [employer]

## 2021-06-14 ENCOUNTER — Ambulatory Visit
Admission: EM | Admit: 2021-06-14 | Discharge: 2021-06-14 | Disposition: A | Discharge status: Home / Self Care | Payer: Medicare HMO | Attending: Emergency Medicine | Admitting: Emergency Medicine

## 2021-06-14 ENCOUNTER — Other Ambulatory Visit: Payer: Self-pay

## 2021-06-14 ENCOUNTER — Encounter: Payer: Self-pay | Admitting: Emergency Medicine

## 2021-06-14 DIAGNOSIS — R443 Hallucinations, unspecified: Secondary | ICD-10-CM | POA: Diagnosis not present

## 2021-06-14 LAB — POCT URINALYSIS DIP (MANUAL ENTRY)
Bilirubin, UA: NEGATIVE
Blood, UA: NEGATIVE
Glucose, UA: NEGATIVE mg/dL
Ketones, POC UA: NEGATIVE mg/dL
Leukocytes, UA: NEGATIVE
Nitrite, UA: NEGATIVE
Protein Ur, POC: NEGATIVE mg/dL
Spec Grav, UA: >=1.03 — AB (ref 1.010–1.025)
Urobilinogen, UA: 0.2 E.U./dL
pH, UA: 5.5 (ref 5.0–8.0)

## 2021-06-14 NOTE — ED Triage Notes (Signed)
Pt is present today with hallucinations that started today. Pt son thinks that she may have a UTI. Pt was recently treated for UTI

## 2021-06-14 NOTE — Discharge Instructions (Addendum)
Urine normal Blood work pending Follow up with neurology Go to ED if symptoms worsening

## 2021-06-14 NOTE — ED Provider Notes (Signed)
[facility]    CSN: [id number] Arrival date & time: 06/14/21  1049      History   Chief Complaint Chief Complaint  Patient presents with   Hallucinations    HPI SHRISTI BITER is a 87 y.o. female history of COPD, sarcoidosis, cardiomyopathy, presenting today for evaluation of possible UTI.  Patient is brought in by her son for evaluation of hallucinations.  Patient was recently treated for UTI with Macrobid.  Urine culture grew out E. coli.  Completed course of Macrobid approximately 10 days ago.  Initially had hallucinations with UTI, but these resolved with treatment.  Reports over the past 1 to 2 days has had worsening hallucinations again.  Often talking about babies and small children.  He denies any noticeable change in her mobility/strength.  Denies her complaining of pain.  Denies recent URI symptoms or cough, changes in breathing.  Reports has had low sodium in the past as well which may be contributing to symptoms.  He does feel he is she has had intermittent hallucinations/mental status changes that have gradually been changing and has had referral placed to neurology, but she has been unable to follow-up just yet.  Patient lives in an independent living facility.  HPI  Past Medical History:  Diagnosis Date   Bradycardic cardiac arrest ([place]) 07/24/2016   Required CPR, intubation with multiple rib fractures. Had short term cardiac shock with acute heart failure.  Resulted in possible stress-induced cardiomyopathy   Cardiac related syncope 07/24/2016   Bradycardic arrest requiring CPR 2 with transvenous pacemaker placed followed by permanent pacemaker; complicated by respiratory arrest, type II MI, stress-induced cardiopathy   Cardiomyopathy ([place]) 07/2016   Moderate severely reduced EF with apical hypokinesis suggestive of stress-induced cardiac myopathy versus multivessel CAD --> in setting of her to cardiac arrest   COPD (chronic obstructive pulmonary disease) ([place])     By report   History of chronic constipation    Hypercholesterolemia    Osteoporosis    Pacemaker 07/26/2016   Abbott dual-chamber pacemaker: Abbott Assurity Model [id number] - Serial [id number]   Pneumonia 08/2016   Sarcoidosis of lung [facility])    Spinal stenosis     Patient Active Problem List   Diagnosis Date Noted   Acute respiratory failure with hypoxia ([place]) 01/02/2019   Constipation 01/02/2019   Hypokalemia 01/02/2019   Acute lower UTI 01/02/2019   Hyponatremia 12/23/2018   Disorientation    Hyponatremia syndrome 99991111   Acute metabolic encephalopathy 99991111   Aspiration pneumonia of both lower lobes due to gastric secretions ([place]) 08/13/2018   Intractable headache 04/07/2018   Abnormal CT of the abdomen 04/07/2018   CHB (complete heart block) ([place]) 99991111   Chronic systolic congestive heart failure ([place]) 12/20/2016   Angioedema 12/17/2016   Chronic rhinitis 12/17/2016   Dyspnea 11/09/2016   Upper airway cough syndrome 10/29/2016   Abnormal CT of the chest  c/w lipoid pna 10/29/2016   Toe ulcer, right ([place]) 10/17/2016   S/P placement of cardiac pacemaker 09/19/2016   History of syncope 09/19/2016   Bradycardic cardiac arrest ([place]) 07/24/2016    Past Surgical History:  Procedure Laterality Date   ADENOIDECTOMY     PACEMAKER INSERTION Left 07/26/2016   Abbott Assurity Model [id number] - Serial [id number] -- complicated by pneumothorax requiring chest tube placement   TEE WITHOUT CARDIOVERSION  02/2013   EF 55-60% with normal global function. No thrombus, mass or vegetation. Trivial-small pericardial effusion. Moderate LA dilation. Mild MR.   TONSILLECTOMY  TRANSTHORACIC ECHOCARDIOGRAM  07/24/2016   [facility]: EF 45-50%. Mid and apical inferior, inferolateral and apical hypokinesis. GR 2 DD. Mild LA and moderate RA dilation. Mild paracardial effusion. Moderate to severe TR. Basal and mid segment hypokinesis with apical hypokinesis. -  Suspect stress-induced cardiopathy versus multivessel CAD.   TRANSTHORACIC ECHOCARDIOGRAM  07/26/2016   [place]: LIMITED (post PPM) - although the report suggested EF 30-35% with apical akinesis, rounding note suggested this was an improvement    OB History   No obstetric history on file.      Home Medications    Prior to Admission medications   Medication Sig Start Date End Date Taking? Authorizing Provider  amLODipine (NORVASC) 2.5 MG tablet TAKE 1 TABLET BY MOUTH EVERY DAY 04/10/21   Saguier, Percell Miller, PA-C  Ascorbic Acid (VITAMIN C) 1000 MG tablet Take 1,000 mg by mouth daily.    [provider]  atorvastatin (LIPITOR) 10 MG tablet TAKE 1 TABLET BY MOUTH EVERY DAY 05/31/21   Saguier, Percell Miller, PA-C  azelastine (ASTELIN) 0.1 % nasal spray Place 2 sprays into both nostrils 2 (two) times daily. Use in each nostril as directed 02/24/20   Saguier, Percell Miller, PA-C  Biotin 10000 MCG TABS Take 10,000 mcg by mouth daily.     [provider]  docusate sodium (COLACE) 100 MG capsule Take 100 mg by mouth daily as needed for mild constipation.     [provider]  hydroxypropyl methylcellulose / hypromellose (ISOPTO TEARS / GONIOVISC) 2.5 % ophthalmic solution Place 1 drop into both eyes daily.    [provider]  levocetirizine (XYZAL) 5 MG tablet TAKE 1 TABLET BY MOUTH EVERY DAY IN THE EVENING Patient taking differently: Take 5 mg by mouth every evening. 11/23/19   Saguier, Percell Miller, PA-C  Multiple Vitamins-Minerals (MULTIVITAMIN WITH MINERALS) tablet Take 1 tablet by mouth daily.    [provider]    Family History Family History  Problem Relation Age of Onset   Allergic rhinitis Neg Hx    Angioedema Neg Hx    Asthma Neg Hx    Eczema Neg Hx    Immunodeficiency Neg Hx    Urticaria Neg Hx     Social History Social History   Tobacco Use   Smoking status: Former    Packs/day: 1.50    Years: 17.00    Pack years: 25.50     Types: Cigarettes    Start date: 12/18/1963    Quit date: 11/20/1983    Years since quitting: 37.5   Smokeless tobacco: Never  Vaping Use   Vaping Use: Never used  Substance Use Topics   Alcohol use: No   Drug use: No     Allergies   Dust mite extract, Levaquin [levofloxacin in d5w], Lidex [fluocinonide], Molds & smuts, Rifampin, Lactose, and Penicillins   Review of Systems Review of Systems  Constitutional:  Negative for fever.  Respiratory:  Negative for shortness of breath.   Cardiovascular:  Negative for chest pain.  Gastrointestinal:  Negative for abdominal pain, diarrhea, nausea and vomiting.  Genitourinary:  Negative for dysuria, flank pain, genital sores, hematuria, menstrual problem, vaginal bleeding, vaginal discharge and vaginal pain.  Musculoskeletal:  Negative for back pain.  Skin:  Negative for rash.  Neurological:  Negative for dizziness, light-headedness and headaches.  Psychiatric/Behavioral:  Positive for hallucinations.     Physical Exam Triage Vital Signs ED Triage Vitals  Enc Vitals Group     BP  Pulse      Resp      Temp      Temp src      SpO2      Weight      Height      Head Circumference      Peak Flow      Pain Score      Pain Loc      Pain Edu?      Excl. in [place]?    No data found.  Updated Vital Signs BP (!) 156/84   Pulse 86   Temp 98.4 F (36.9 C)   Resp 16   SpO2 91%   Visual Acuity Right Eye Distance:   Left Eye Distance:   Bilateral Distance:    Right Eye Near:   Left Eye Near:    Bilateral Near:     Physical Exam Vitals and nursing note reviewed.  Constitutional:      Appearance: She is well-developed.     Comments: No acute distress, pleasantly confused  HENT:     Head: Normocephalic and atraumatic.     Right Ear: Tympanic membrane normal.     Left Ear: Tympanic membrane normal.     Nose: Nose normal.  Eyes:     Conjunctiva/sclera: Conjunctivae normal.     Comments: Occasionally closing eyes but easily  arousable  Cardiovascular:     Rate and Rhythm: Normal rate and regular rhythm.  Pulmonary:     Effort: Pulmonary effort is normal. No respiratory distress.     Comments: Breathing comfortably at rest, CTABL, no wheezing, rales or other adventitious sounds auscultated  Abdominal:     General: There is no distension.  Musculoskeletal:        General: Normal range of motion.     Cervical back: Neck supple.  Skin:    General: Skin is warm and dry.  Neurological:     Mental Status: She is alert.     UC Treatments / Results  Labs (all labs ordered are listed, but only abnormal results are displayed) Labs Reviewed  POCT URINALYSIS DIP (MANUAL ENTRY) - Abnormal; Notable for the following components:      Result Value   Spec Grav, UA >=1.030 (*)    All other components within normal limits  CBC WITH DIFFERENTIAL/PLATELET  COMPREHENSIVE METABOLIC PANEL    EKG   Radiology No results found.  Procedures Procedures (including critical care time)  Medications Ordered in UC Medications - No data to display  Initial Impression / Assessment and Plan / UC Course  I have reviewed the triage vital signs and the nursing notes.  Pertinent labs & imaging results that were available during my care of the patient were reviewed by me and considered in my medical decision making (see chart for details).     UA not consistent with UTI, discussed options with son, discussed recommended further evaluation in emergency room if symptoms have been more acutely changing, but if these have been more gradual may be related to underlying dementia.  He does feel and admit that this may have been more gradual, will check blood work here and ensure electrolytes normal, encouraged follow-up with neurology.  If symptoms continuing to progress or worsen to follow-up in ED.  Discussed strict return precautions. Patient verbalized understanding and is agreeable with plan.  Final Clinical Impressions(s) / UC  Diagnoses   Final diagnoses:  Hallucinations     Discharge Instructions      Urine normal Blood  work pending Follow up with neurology Go to ED if symptoms worsening     ED Prescriptions   None    PDMP not reviewed this encounter.   Janith Lima, [place] 06/14/21 1314

## 2021-06-14 NOTE — Telephone Encounter (Signed)
Called Tracey Holt stated mom is on edge and cant wait to be seen tomorrow , Tracey Holt states he would take patient to the new urgent care on [address]

## 2021-06-15 ENCOUNTER — Other Ambulatory Visit (INDEPENDENT_AMBULATORY_CARE_PROVIDER_SITE_OTHER): Payer: Medicare HMO

## 2021-06-15 ENCOUNTER — Ambulatory Visit (INDEPENDENT_AMBULATORY_CARE_PROVIDER_SITE_OTHER): Payer: Medicare HMO

## 2021-06-15 ENCOUNTER — Ambulatory Visit (INDEPENDENT_AMBULATORY_CARE_PROVIDER_SITE_OTHER): Payer: Medicare HMO | Admitting: Physician Assistant

## 2021-06-15 ENCOUNTER — Encounter: Payer: Self-pay | Admitting: Physician Assistant

## 2021-06-15 VITALS — BP 112/74 | HR 79 | Ht <= 58 in | Wt 91.0 lb

## 2021-06-15 DIAGNOSIS — G309 Alzheimer's disease, unspecified: Secondary | ICD-10-CM

## 2021-06-15 DIAGNOSIS — I442 Atrioventricular block, complete: Secondary | ICD-10-CM | POA: Diagnosis not present

## 2021-06-15 DIAGNOSIS — F0151 Vascular dementia with behavioral disturbance: Secondary | ICD-10-CM | POA: Diagnosis not present

## 2021-06-15 DIAGNOSIS — F01518 Vascular dementia, unspecified severity, with other behavioral disturbance: Secondary | ICD-10-CM

## 2021-06-15 LAB — CBC WITH DIFFERENTIAL/PLATELET
Basophils Absolute: 0.1 10*3/uL (ref 0.0–0.2)
Basos: 1 %
EOS (ABSOLUTE): 0 10*3/uL (ref 0.0–0.4)
Eos: 0 %
Hematocrit: 36.6 % (ref 34.0–46.6)
Hemoglobin: 11.6 g/dL (ref 11.1–15.9)
Immature Grans (Abs): 0 10*3/uL (ref 0.0–0.1)
Immature Granulocytes: 0 %
Lymphocytes Absolute: 0.8 10*3/uL (ref 0.7–3.1)
Lymphs: 10 %
MCH: 27.9 pg (ref 26.6–33.0)
MCHC: 31.7 g/dL (ref 31.5–35.7)
MCV: 88 fL (ref 79–97)
Monocytes Absolute: 0.9 10*3/uL (ref 0.1–0.9)
Monocytes: 11 %
Neutrophils Absolute: 6.4 10*3/uL (ref 1.4–7.0)
Neutrophils: 78 %
Platelets: 223 10*3/uL (ref 150–450)
RBC: 4.16 x10E6/uL (ref 3.77–5.28)
RDW: 14.1 % (ref 11.7–15.4)
WBC: 8.2 10*3/uL (ref 3.4–10.8)

## 2021-06-15 LAB — COMPREHENSIVE METABOLIC PANEL
ALT: 19 IU/L (ref 0–32)
AST: 27 IU/L (ref 0–40)
Albumin/Globulin Ratio: 1.8 (ref 1.2–2.2)
Albumin: 4.3 g/dL (ref 3.5–4.6)
Alkaline Phosphatase: 68 IU/L (ref 44–121)
BUN/Creatinine Ratio: 32 — ABNORMAL HIGH (ref 12–28)
BUN: 25 mg/dL (ref 10–36)
Bilirubin Total: 0.7 mg/dL (ref 0.0–1.2)
CO2: 26 mmol/L (ref 20–29)
Calcium: 9.4 mg/dL (ref 8.7–10.3)
Chloride: 101 mmol/L (ref 96–106)
Creatinine, Ser: 0.78 mg/dL (ref 0.57–1.00)
Globulin, Total: 2.4 g/dL (ref 1.5–4.5)
Glucose: 83 mg/dL (ref 65–99)
Potassium: 4.3 mmol/L (ref 3.5–5.2)
Sodium: 143 mmol/L (ref 134–144)
Total Protein: 6.7 g/dL (ref 6.0–8.5)
eGFR: 69 mL/min/{1.73_m2} (ref >59)

## 2021-06-15 LAB — TSH: TSH: 3.48 u[IU]/mL (ref 0.35–5.50)

## 2021-06-15 LAB — VITAMIN B12: Vitamin B-12: 431 pg/mL (ref 211–911)

## 2021-06-15 NOTE — Patient Instructions (Signed)
It was a pleasure to see you today at our office.   Recommendations:  Meds: Follow up  when results are ready  Labs today     RECOMMENDATIONS FOR ALL PATIENTS WITH MEMORY PROBLEMS: 1. Continue to exercise (Recommend 30 minutes of walking everyday, or 3 hours every week) 2. Increase social interactions - continue going to [place] and enjoy social gatherings with friends and family 3. Eat healthy, avoid fried foods and eat more fruits and vegetables 4. Maintain adequate blood pressure, blood sugar, and blood cholesterol level. Reducing the risk of stroke and cardiovascular disease also helps promoting better memory. 5. Avoid stressful situations. Live a simple life and avoid aggravations. Organize your time and prepare for the next day in anticipation. 6. Sleep well, avoid any interruptions of sleep and avoid any distractions in the bedroom that may interfere with adequate sleep quality 7. Avoid sugar, avoid sweets as there is a strong link between excessive sugar intake, diabetes, and cognitive impairment We discussed the Mediterranean diet, which has been shown to help patients reduce the risk of progressive memory disorders and reduces cardiovascular risk. This includes eating fish, eat fruits and green leafy vegetables, nuts like almonds and hazelnuts, walnuts, and also use olive oil. Avoid fast foods and fried foods as much as possible. Avoid sweets and sugar as sugar use has been linked to worsening of memory function.  There is always a concern of gradual progression of memory problems. If this is the case, then we may need to adjust level of care according to patient needs. Support, both to the patient and caregiver, should then be put into place.    The Alzheimer's Association is here all day, every day for people facing Alzheimer's disease through our free 24/7 Helpline: [phone]. The Helpline provides reliable information and support to all those who need assistance, such as  individuals living with memory loss, Alzheimer's or other dementia, caregivers, health care professionals and the public.  Our highly trained and knowledgeable staff can help you with: Understanding memory loss, dementia and Alzheimer's  Medications and other treatment options  General information about aging and brain health  Skills to provide quality care and to find the best care from professionals  Legal, financial and living-arrangement decisions Our Helpline also features: Confidential care consultation provided by master's level clinicians who can help with decision-making support, crisis assistance and education on issues families face every day  Help in a caller's preferred language using our translation service that features more than 200 languages and dialects  Referrals to local community programs, services and ongoing support     FALL PRECAUTIONS: Be cautious when walking. Scan the area for obstacles that may increase the risk of trips and falls. When getting up in the mornings, sit up at the edge of the bed for a few minutes before getting out of bed. Consider elevating the bed at the head end to avoid drop of blood pressure when getting up. Walk always in a well-lit room (use night lights in the walls). Avoid area rugs or power cords from appliances in the middle of the walkways. Use a walker or a cane if necessary and consider physical therapy for balance exercise. Get your eyesight checked regularly.  FINANCIAL OVERSIGHT: Supervision, especially oversight when making financial decisions or transactions is also recommended.  HOME SAFETY: Consider the safety of the kitchen when operating appliances like stoves, microwave oven, and blender. Consider having supervision and share cooking responsibilities until no longer able to participate in  those. Accidents with firearms and other hazards in the house should be identified and addressed as well.   ABILITY TO BE LEFT ALONE: If  patient is unable to contact 911 operator, consider using LifeLine, or when the need is there, arrange for someone to stay with patients. Smoking is a fire hazard, consider supervision or cessation. Risk of wandering should be assessed by caregiver and if detected at any point, supervision and safe proof recommendations should be instituted.  MEDICATION SUPERVISION: Inability to self-administer medication needs to be constantly addressed. Implement a mechanism to ensure safe administration of the medications.   DRIVING: Regarding driving, in patients with progressive memory problems, driving will be impaired. We advise to have someone else do the driving if trouble finding directions or if minor accidents are reported. Independent driving assessment is available to determine safety of driving.   If you are interested in the driving assessment, you can contact the following:  [employer] in [place]  [place] [phone] or [phone]      [place] refers to food and lifestyle choices that are based on the traditions of countries located on the [employer]. This way of eating has been shown to help prevent certain conditions and improve outcomes for people who have chronic diseases, like kidney disease and heart disease. What are tips for following this plan? Lifestyle  Cook and eat meals together with your family, when possible. Drink enough fluid to keep your urine clear or pale yellow. Be physically active every day. This includes: Aerobic exercise like running or swimming. Leisure activities like gardening, walking, or housework. Get 7-8 hours of sleep each night. If recommended by your health care provider, drink red wine in moderation. This means 1 glass a day for nonpregnant women and 2 glasses a day for men. A glass of wine  equals 5 oz (150 mL). Reading food labels  Check the serving size of packaged foods. For foods such as rice and pasta, the serving size refers to the amount of cooked product, not dry. Check the total fat in packaged foods. Avoid foods that have saturated fat or trans fats. Check the ingredients list for added sugars, such as corn syrup. Shopping  At the grocery store, buy most of your food from the areas near the walls of the store. This includes: Fresh fruits and vegetables (produce). Grains, beans, nuts, and seeds. Some of these may be available in unpackaged forms or large amounts (in bulk). Fresh seafood. Poultry and eggs. Low-fat dairy products. Buy whole ingredients instead of prepackaged foods. Buy fresh fruits and vegetables in-season from local farmers markets. Buy frozen fruits and vegetables in resealable bags. If you do not have access to quality fresh seafood, buy precooked frozen shrimp or canned fish, such as tuna, salmon, or sardines. Buy small amounts of raw or cooked vegetables, salads, or olives from the deli or salad bar at your store. Stock your pantry so you always have certain foods on hand, such as olive oil, canned tuna, canned tomatoes, rice, pasta, and beans. Cooking  Cook foods with extra-virgin olive oil instead of using butter or other vegetable oils. Have meat as a side dish, and have vegetables or grains as your main dish. This means having meat in small portions or adding small amounts of meat to foods like pasta or stew. Use beans or vegetables instead of meat in common dishes like chili  or lasagna. Experiment with different cooking methods. Try roasting or broiling vegetables instead of steaming or sauteing them. Add frozen vegetables to soups, stews, pasta, or rice. Add nuts or seeds for added healthy fat at each meal. You can add these to yogurt, salads, or vegetable dishes. Marinate fish or vegetables using olive oil, lemon juice, garlic, and fresh  herbs. Meal planning  Plan to eat 1 vegetarian meal one day each week. Try to work up to 2 vegetarian meals, if possible. Eat seafood 2 or more times a week. Have healthy snacks readily available, such as: Vegetable sticks with hummus. Greek yogurt. Fruit and nut trail mix. Eat balanced meals throughout the week. This includes: Fruit: 2-3 servings a day Vegetables: 4-5 servings a day Low-fat dairy: 2 servings a day Fish, poultry, or lean meat: 1 serving a day Beans and legumes: 2 or more servings a week Nuts and seeds: 1-2 servings a day Whole grains: 6-8 servings a day Extra-virgin olive oil: 3-4 servings a day Limit red meat and sweets to only a few servings a month What are my food choices? Mediterranean diet Recommended Grains: Whole-grain pasta. Brown rice. Bulgar wheat. Polenta. Couscous. Whole-wheat bread. [place]. Vegetables: Artichokes. Beets. Broccoli. Cabbage. Carrots. Eggplant. Green beans. Chard. Kale. Spinach. Onions. Leeks. Peas. Squash. Tomatoes. Peppers. Radishes. Fruits: Apples. Apricots. Avocado. Berries. Bananas. Cherries. Dates. Figs. Grapes. Lemons. Melon. Oranges. Peaches. Plums. Pomegranate. Meats and other protein foods: Beans. Almonds. Sunflower seeds. Pine nuts. Peanuts. [place]. Salmon. Scallops. Shrimp. [place]. Tilapia. Clams. Oysters. Eggs. Dairy: Low-fat milk. Cheese. Greek yogurt. Beverages: Water. Red wine. Herbal tea. Fats and oils: Extra virgin olive oil. Avocado oil. Grape seed oil. Sweets and desserts: [place] yogurt with honey. Baked apples. Poached pears. Trail mix. Seasoning and other foods: Basil. Cilantro. Coriander. Cumin. Mint. Parsley. Sage. Rosemary. Tarragon. Garlic. Oregano. Thyme. Pepper. Balsalmic vinegar. Tahini. Hummus. Tomato sauce. Olives. Mushrooms. Limit these Grains: Prepackaged pasta or rice dishes. Prepackaged cereal with added sugar. Vegetables: Deep fried potatoes (french fries). Fruits: Fruit canned in syrup. Meats and  other protein foods: Beef. Pork. Lamb. Poultry with skin. Hot dogs. [place] Salines. Dairy: Ice cream. Sour cream. Whole milk. Beverages: Juice. Sugar-sweetened soft drinks. Beer. Liquor and spirits. Fats and oils: Butter. Canola oil. Vegetable oil. Beef fat (tallow). Lard. Sweets and desserts: Cookies. Cakes. Pies. Candy. Seasoning and other foods: Mayonnaise. Premade sauces and marinades. The items listed may not be a complete list. Talk with your dietitian about what dietary choices are right for you. Summary The Mediterranean diet includes both food and lifestyle choices. Eat a variety of fresh fruits and vegetables, beans, nuts, seeds, and whole grains. Limit the amount of red meat and sweets that you eat. Talk with your health care provider about whether it is safe for you to drink red wine in moderation. This means 1 glass a day for nonpregnant women and 2 glasses a day for men. A glass of wine equals 5 oz (150 mL). This information is not intended to replace advice given to you by your health care provider. Make sure you discuss any questions you have with your health care provider. Document Released: 06/28/2016 Document Revised: 07/31/2016 Document Reviewed: 06/28/2016 Elsevier Interactive Patient Education  2017 [employer].

## 2021-06-15 NOTE — Progress Notes (Addendum)
Assessment/Plan:   Tracey Holt is a 87 y.o. year old female with risk factors including  age, hypertension, hyperlipidemia, CAD, s/p PMP due to bradycardic cardiac arrest/ CHB, seen today for evaluation of memory loss.  The patient was unable to perform any mini mental status exam such as MoCA, MMSE or SLUMS in view of her age and advanced memory issues consistent with dementia.   Recommendations:   Dementia, likely mixed vascular and Alzheimer's with behavioral disturbance  CT head without contrast to assess for underlying structural abnormality and assess vascular load  Check B12, TSH No new meds at this time, as patient's son HCPOA prefers to wait until the above results return. Discussed safety both in and out of the home.  Discussed the possibility of 24/7 care at  [facility]F Discussed the importance of regular daily schedule  Folllow up once results above are available   Subjective:    The patient is seen in neurologic consultation at the request of Saguier, Iris Pert for the evaluation of memory.  The patient is accompanied by her son who provides all the information, in view that the patient is unable to do so.  She s a 87 y.o. year old female who has had memory issues for about 5 months, described as "slow decline ".  She began to forget the days of the week, names of the grandchildren, what she ate the day before, and occasionally forgetting that she asked the same question.  She did not repeat the same stories.  About 3 weeks ago, she had a UTI, and since then, her mental status has worsened.  At the time, she had increased agitation, which is now controlled.  She was treated with antibiotics at the emergency department and sent home.  Yesterday, she had another visit to the ER with similar symptoms, yielding negative blood work-up, and urine without UTI.  Her son states that she has good days and bad days, where she is "more awake or not ".  When she hallucinates, he reports  "going along with it ".  For example, one week ago she grabbed the [employer], thinking that it was a baby and did not want to leave the house without the Saran wrap.  The hallucinations are usually during the daytime, not at nighttime.  She sleeps a significant amount of time, reporting "I am very tired ".  During the visit, she repeated the statement quite frequently.  She  is very protective of her belongings, almost "paranoid about somebody taking her stuff ".  Her appetite is mildly reduced, eats about 2 meals a day, and he admits that she should be pushing more fluids.  She no longer cooks or drives.  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  She does not wander off, but does spit out and they had to bring her back into the house.  The patient lives at [facility] and she does some activity there to keep her busy.  However, they are trying to transfer her at some point to a [place], as her status is declining and for safety reasons Her son is not aware of the patient reporting headaches, falls, or injuries to the head, double vision, dizziness, focal numbness or tingling, unilateral weakness or  tremors.  She does have urine incontinence and occasional constipation. Denies anosmia. Denies history of OSA, ETOH  Tobacco. Family History negative for dementia   Labs 06/15/2021 CMP unremarkable CBC was normal UA was negative  A CT of the head to 03/2019 without acute findings.  Mild age-related volume loss, and minimal small vessel change of the hemispheric white matter   Allergies  Allergen Reactions   Dust Mite Extract Shortness Of Breath   Levaquin [Levofloxacin In D5w] Shortness Of Breath   Lidex [Fluocinonide] Swelling    Caused lip swelling   Molds & Smuts Shortness Of Breath   Rifampin  Shortness Of Breath   Lactose Other (See Comments)    Unknown rxn per pt   Penicillins Swelling    Did it involve swelling of the face/tongue/throat, SOB, or low BP? No Did it involve sudden or severe rash/hives, skin peeling, or any reaction on the inside of your mouth or nose? Unknown Did you need to seek medical attention at a hospital or doctor's office? Unknown When did it last happen? Unknown If all above answers are "NO", may proceed with cephalosporin use.     Current Outpatient Medications  Medication Instructions   amLODipine (NORVASC) 2.5 MG tablet TAKE 1 TABLET BY MOUTH EVERY DAY   atorvastatin (LIPITOR) 10 MG tablet TAKE 1 TABLET BY MOUTH EVERY DAY   azelastine (ASTELIN) 0.1 % nasal spray 2 sprays, Each Nare, 2 times daily, Use in each nostril as directed   Biotin 10,000 mcg, Daily   hydroxypropyl methylcellulose / hypromellose (ISOPTO TEARS / GONIOVISC) 2.5 % ophthalmic solution 1 drop, Daily   levocetirizine (XYZAL) 5 MG tablet TAKE 1 TABLET BY MOUTH EVERY DAY IN THE EVENING   Multiple Vitamins-Minerals (MULTIVITAMIN WITH MINERALS) tablet 1 tablet, Daily   vitamin C 1,000 mg, Daily     VITALS:   Vitals:   06/15/21 1339  BP: 112/74  Pulse: 79  SpO2: 90%  Weight: 91 lb (41.3 kg)  Height: '4\' 8"'$  (1.422 m)     PHYSICAL EXAM   HEENT:  Normocephalic, atraumatic. The mucous membranes are moist. The superficial temporal arteries are without ropiness or tenderness. Cardiovascular: Regular rate and rhythm. Lungs: Clear to auscultation bilaterally. Neck: There are no carotid bruits noted bilaterally.  NEUROLOGICAL: No flowsheet data found. No flowsheet data found.  No flowsheet data found.   Orientation:  Alert but not oriented to person (only to self), place and time. No aphasia or dysarthria. Fund of knowledge is reduced. Recent memory and remote memory impaired.  Attention and concentration are reduced. Unable to name objects and repeat phrases.  Cranial nerves:  There is good facial symmetry. Extraocular muscles are intact and visual fields are full to confrontational testing. Speech is not fluent but clear. Soft palate rises symmetrically and there is no tongue deviation. Hearing is intact to conversational tone. Tone: Tone is fair throughout. Sensation: Sensation is intact to light touch and pinprick throughout. Vibration is intact at the bilateral big toe.There is no extinction with double simultaneous stimulation. There is no sensory dermatomal level identified. Coordination: The patient has difficulty with RAM's or FNF bilaterally or  finger to nose , unable to understand the command  Motor: Unable to assess as patient unable to follow this command . There is no pronator drift. There are no fasciculations noted. DTR's: Deep tendon reflexes are 2/4 at the bilateral biceps, triceps, brachioradialis, patella and achilles.  Plantar responses are downgoing bilaterally. Gait and Station: The patient is able  to ambulate with a walker, slow and cautious gait and stride. Pronounced kyphosis     Thank you for allowing [place] the opportunity to participate in the care of this nice patient. Please do not hesitate to contact us for any questions or concerns.   Total time spent on today's visit was  60 minutes, including both face-to-face time and nonface-to-face time.  Time included that spent on review of records (prior notes available to me/labs/imaging if pertinent), discussing treatment and goals, answering patient's questions and coordinating care.  Cc:  Saguier, Morene Crocker [facility] 06/16/2021 11:05 AM

## 2021-06-16 ENCOUNTER — Encounter: Payer: Self-pay | Admitting: Medical

## 2021-06-16 ENCOUNTER — Telehealth: Payer: Self-pay | Admitting: Medical

## 2021-06-16 DIAGNOSIS — F01518 Vascular dementia, unspecified severity, with other behavioral disturbance: Secondary | ICD-10-CM | POA: Insufficient documentation

## 2021-06-16 DIAGNOSIS — F0151 Vascular dementia with behavioral disturbance: Secondary | ICD-10-CM | POA: Insufficient documentation

## 2021-06-16 LAB — CUP PACEART REMOTE DEVICE CHECK
Battery Remaining Longevity: 38 mo
Battery Remaining Percentage: 42 %
Battery Voltage: 2.95 V
Brady Statistic AP VP Percent: 35 %
Brady Statistic AP VS Percent: 1 %
Brady Statistic AS VP Percent: 65 %
Brady Statistic AS VS Percent: 1 %
Brady Statistic RA Percent Paced: 34 %
Brady Statistic RV Percent Paced: 99 %
Date Time Interrogation Session: 20220728020012
Implantable Lead Implant Date: 20170907
Implantable Lead Implant Date: 20170907
Implantable Lead Location: 753859
Implantable Lead Location: 753860
Implantable Pulse Generator Implant Date: 20170907
Lead Channel Impedance Value: 330 Ohm
Lead Channel Impedance Value: 390 Ohm
Lead Channel Pacing Threshold Amplitude: 0.5 V
Lead Channel Pacing Threshold Amplitude: 0.75 V
Lead Channel Pacing Threshold Pulse Width: 0.4 ms
Lead Channel Pacing Threshold Pulse Width: 0.4 ms
Lead Channel Sensing Intrinsic Amplitude: 2.6 mV
Lead Channel Sensing Intrinsic Amplitude: 7.3 mV
Lead Channel Setting Pacing Amplitude: 2.5 V
Lead Channel Setting Pacing Amplitude: 2.5 V
Lead Channel Setting Pacing Pulse Width: 0.4 ms
Lead Channel Setting Sensing Sensitivity: 2 mV
Pulse Gen Model: 2272
Pulse Gen Serial Number: 7944486

## 2021-06-16 NOTE — Telephone Encounter (Signed)
Pt needs a OV , speaking with son via mychart to set up an OV

## 2021-06-16 NOTE — Telephone Encounter (Signed)
Pt's family member Trae Yurchak) dropped off document to be filled out by provider [facility] Form 6 pages). Pt's family member would like to be called when document ready to pick up at [facility] [phone]. Pt's family member mentioned pt is needing TB test done. Please advise. Document put at front office tray under providers name.

## 2021-06-19 NOTE — Telephone Encounter (Signed)
Spoke with Louie Casa and he stated he will call back to schedule an appointment because he doesn't know when his mom will be placed and doesn't want form to expire in the meantime

## 2021-06-21 NOTE — Progress Notes (Unsigned)
CT of Head waiting for determination  from [facility]  Case number [id number] Pt ID [id number]  [employer] number is [phone]

## 2021-07-10 ENCOUNTER — Telehealth: Payer: Self-pay | Admitting: Medical

## 2021-07-10 NOTE — Telephone Encounter (Signed)
I have PT/OT/ST form to sign. I will approve but will you call pt/son Janalyn Shy. Want her to have in office visit or at least virtual visit to discuss her current condition. Insurance rules do typically require visit when giving authorization for services.

## 2021-07-10 NOTE — Telephone Encounter (Signed)
Spoke with Louie Casa and he stated he will make an appointment when he gets back in town , he states Tracey Holt is doing well.

## 2021-07-12 NOTE — Progress Notes (Signed)
Remote pacemaker transmission.   

## 2021-07-26 ENCOUNTER — Telehealth: Payer: Self-pay

## 2021-07-26 NOTE — Telephone Encounter (Signed)
06/22/2021 ct was denied, fyi

## 2021-07-26 NOTE — Telephone Encounter (Signed)
Ct was not done, on following up.can you review and still see if this is needed, looks like hospice referral was sent. Thanks.

## 2021-09-14 ENCOUNTER — Ambulatory Visit (INDEPENDENT_AMBULATORY_CARE_PROVIDER_SITE_OTHER): Payer: Medicare HMO

## 2021-09-14 DIAGNOSIS — I442 Atrioventricular block, complete: Secondary | ICD-10-CM | POA: Diagnosis not present

## 2021-09-14 LAB — CUP PACEART REMOTE DEVICE CHECK
Battery Remaining Longevity: 36 mo
Battery Remaining Longevity: 36 mo
Battery Remaining Percentage: 39 %
Battery Remaining Percentage: 39 %
Battery Voltage: 2.95 V
Battery Voltage: 2.95 V
Brady Statistic AP VP Percent: 35 %
Brady Statistic AP VP Percent: 35 %
Brady Statistic AP VS Percent: 1 %
Brady Statistic AP VS Percent: 1 %
Brady Statistic AS VP Percent: 65 %
Brady Statistic AS VP Percent: 65 %
Brady Statistic AS VS Percent: 1 %
Brady Statistic AS VS Percent: 1 %
Brady Statistic RA Percent Paced: 34 %
Brady Statistic RA Percent Paced: 34 %
Brady Statistic RV Percent Paced: 99 %
Brady Statistic RV Percent Paced: 99 %
Date Time Interrogation Session: 20221027020016
Date Time Interrogation Session: 20221027020016
Implantable Lead Connection Status: 753985
Implantable Lead Connection Status: 753985
Implantable Lead Implant Date: 20170907
Implantable Lead Implant Date: 20170907
Implantable Lead Implant Date: 20170907
Implantable Lead Implant Date: 20170907
Implantable Lead Location: 753859
Implantable Lead Location: 753860
Implantable Lead Location: 753859
Implantable Lead Location: 753860
Implantable Pulse Generator Implant Date: 20170907
Implantable Pulse Generator Implant Date: 20170907
Lead Channel Impedance Value: 340 Ohm
Lead Channel Impedance Value: 390 Ohm
Lead Channel Impedance Value: 340 Ohm
Lead Channel Impedance Value: 390 Ohm
Lead Channel Pacing Threshold Amplitude: 0.5 V
Lead Channel Pacing Threshold Amplitude: 0.75 V
Lead Channel Pacing Threshold Amplitude: 0.5 V
Lead Channel Pacing Threshold Amplitude: 0.75 V
Lead Channel Pacing Threshold Pulse Width: 0.4 ms
Lead Channel Pacing Threshold Pulse Width: 0.4 ms
Lead Channel Pacing Threshold Pulse Width: 0.4 ms
Lead Channel Pacing Threshold Pulse Width: 0.4 ms
Lead Channel Sensing Intrinsic Amplitude: 12 mV
Lead Channel Sensing Intrinsic Amplitude: 2.3 mV
Lead Channel Sensing Intrinsic Amplitude: 12 mV
Lead Channel Sensing Intrinsic Amplitude: 2.3 mV
Lead Channel Setting Pacing Amplitude: 2.5 V
Lead Channel Setting Pacing Amplitude: 2.5 V
Lead Channel Setting Pacing Amplitude: 2.5 V
Lead Channel Setting Pacing Amplitude: 2.5 V
Lead Channel Setting Pacing Pulse Width: 0.4 ms
Lead Channel Setting Pacing Pulse Width: 0.4 ms
Lead Channel Setting Sensing Sensitivity: 2 mV
Lead Channel Setting Sensing Sensitivity: 2 mV
Pulse Gen Model: 2272
Pulse Gen Model: 2272
Pulse Gen Serial Number: 7944486
Pulse Gen Serial Number: 7944486

## 2021-09-22 NOTE — Progress Notes (Signed)
Remote pacemaker transmission.   

## 2021-10-25 ENCOUNTER — Emergency Department (HOSPITAL_COMMUNITY)
Admission: EM | Admit: 2021-10-25 | Discharge: 2021-10-26 | Disposition: A | Discharge status: Home / Self Care | Payer: Medicare HMO | Attending: Emergency Medicine | Admitting: Emergency Medicine

## 2021-10-25 ENCOUNTER — Emergency Department (HOSPITAL_COMMUNITY): Payer: Medicare HMO

## 2021-10-25 DIAGNOSIS — Z20822 Contact with and (suspected) exposure to covid-19: Secondary | ICD-10-CM | POA: Insufficient documentation

## 2021-10-25 DIAGNOSIS — F0390 Unspecified dementia without behavioral disturbance: Secondary | ICD-10-CM | POA: Insufficient documentation

## 2021-10-25 DIAGNOSIS — J449 Chronic obstructive pulmonary disease, unspecified: Secondary | ICD-10-CM | POA: Insufficient documentation

## 2021-10-25 DIAGNOSIS — R0602 Shortness of breath: Secondary | ICD-10-CM | POA: Insufficient documentation

## 2021-10-25 DIAGNOSIS — R531 Weakness: Secondary | ICD-10-CM | POA: Insufficient documentation

## 2021-10-25 DIAGNOSIS — R109 Unspecified abdominal pain: Secondary | ICD-10-CM | POA: Insufficient documentation

## 2021-10-25 DIAGNOSIS — N390 Urinary tract infection, site not specified: Secondary | ICD-10-CM

## 2021-10-25 DIAGNOSIS — Z950 Presence of cardiac pacemaker: Secondary | ICD-10-CM | POA: Diagnosis not present

## 2021-10-25 DIAGNOSIS — I5022 Chronic systolic (congestive) heart failure: Secondary | ICD-10-CM | POA: Insufficient documentation

## 2021-10-25 DIAGNOSIS — Z87891 Personal history of nicotine dependence: Secondary | ICD-10-CM | POA: Insufficient documentation

## 2021-10-25 NOTE — ED Triage Notes (Signed)
Pt here with Gen. Weakness, SOB and malaise from [facility]. Reported 100.2 temp at facility as well.  Pt also c/o nasal congestion. Hx of COPD and has PM.

## 2021-10-25 NOTE — ED Notes (Signed)
Patient at 91% Room air. Patient put on 2L of O2, stating at 95%

## 2021-10-25 NOTE — ED Provider Notes (Signed)
[facility] EMERGENCY DEPARTMENT Provider Note   CSN: [id number] Arrival date & time: 10/25/21  2241     History No chief complaint on file.   Tracey Holt is a 87 y.o. female.  Pt with generalized weakness. Pt is very poor/limited historian - level 5 caveat. Unclear from patient when symptoms began. Denies unilateral or focal weakness. No report of trauma or fall. No report of fever, although temp 100.3 on arrival. Pt denies headache. Denies chest pain. No abd pain. No dysuria.   The history is provided by the patient and medical records. The history is limited by the condition of the patient.      Past Medical History:  Diagnosis Date   Bradycardic cardiac arrest ([place]) 07/24/2016   Required CPR, intubation with multiple rib fractures. Had short term cardiac shock with acute heart failure.  Resulted in possible stress-induced cardiomyopathy   Cardiac related syncope 07/24/2016   Bradycardic arrest requiring CPR 2 with transvenous pacemaker placed followed by permanent pacemaker; complicated by respiratory arrest, type II MI, stress-induced cardiopathy   Cardiomyopathy ([place]) 07/2016   Moderate severely reduced EF with apical hypokinesis suggestive of stress-induced cardiac myopathy versus multivessel CAD --> in setting of her to cardiac arrest   COPD (chronic obstructive pulmonary disease) ([place])    By report   History of chronic constipation    Hypercholesterolemia    Osteoporosis    Pacemaker 07/26/2016   Abbott dual-chamber pacemaker: Abbott Assurity Model #[id number] - Serial #[id number]   Pneumonia 08/2016   Sarcoidosis of lung [facility])    Spinal stenosis     Patient Active Problem List   Diagnosis Date Noted   Dementia, vascular, mixed, with behavioral disturbance 06/16/2021   Acute respiratory failure with hypoxia ([place]) 01/02/2019   Constipation 01/02/2019   Hypokalemia 01/02/2019   Acute lower UTI 01/02/2019   Hyponatremia 12/23/2018   Disorientation     Hyponatremia syndrome 75/91/6384   Acute metabolic encephalopathy 66/59/9357   Aspiration pneumonia of both lower lobes due to gastric secretions ([place]) 08/13/2018   Intractable headache 04/07/2018   Abnormal CT of the abdomen 04/07/2018   CHB (complete heart block) ([place]) 01/77/9390   Chronic systolic congestive heart failure ([place]) 12/20/2016   Angioedema 12/17/2016   Chronic rhinitis 12/17/2016   Dyspnea 11/09/2016   Upper airway cough syndrome 10/29/2016   Abnormal CT of the chest  c/w lipoid pna 10/29/2016   Toe ulcer, right ([place]) 10/17/2016   S/P placement of cardiac pacemaker 09/19/2016   History of syncope 09/19/2016   Bradycardic cardiac arrest ([place]) 07/24/2016    Past Surgical History:  Procedure Laterality Date   ADENOIDECTOMY     PACEMAKER INSERTION Left 07/26/2016   Abbott Assurity Model #[id number] - Serial #[id number] -- complicated by pneumothorax requiring chest tube placement   TEE WITHOUT CARDIOVERSION  02/2013   EF 55-60% with normal global function. No thrombus, mass or vegetation. Trivial-small pericardial effusion. Moderate LA dilation. Mild MR.   TONSILLECTOMY     TRANSTHORACIC ECHOCARDIOGRAM  07/24/2016   [facility]: EF 45-50%. Mid and apical inferior, inferolateral and apical hypokinesis. GR 2 DD. Mild LA and moderate RA dilation. Mild paracardial effusion. Moderate to severe TR. Basal and mid segment hypokinesis with apical hypokinesis. - Suspect stress-induced cardiopathy versus multivessel CAD.   TRANSTHORACIC ECHOCARDIOGRAM  07/26/2016   [place]: LIMITED (post PPM) - although the report suggested EF 30-35% with apical akinesis, rounding note suggested this was an improvement  OB History   No obstetric history on file.     Family History  Problem Relation Age of Onset   Allergic rhinitis Neg Hx    Angioedema Neg Hx    Asthma Neg Hx    Eczema Neg Hx    Immunodeficiency Neg Hx    Urticaria Neg Hx      Social History   Tobacco Use   Smoking status: Former    Packs/day: 1.50    Years: 17.00    Pack years: 25.50    Types: Cigarettes    Start date: 12/18/1963    Quit date: 11/20/1983    Years since quitting: 37.9   Smokeless tobacco: Never  Vaping Use   Vaping Use: Never used  Substance Use Topics   Alcohol use: No   Drug use: No    Home Medications Prior to Admission medications   Medication Sig Start Date End Date Taking? Authorizing Provider  amLODipine (NORVASC) 2.5 MG tablet TAKE 1 TABLET BY MOUTH EVERY DAY Patient not taking: Reported on 06/15/2021 04/10/21   Saguier, Percell Miller, PA-C  Ascorbic Acid (VITAMIN C) 1000 MG tablet Take 1,000 mg by mouth daily. Patient not taking: Reported on 06/15/2021    [provider]  atorvastatin (LIPITOR) 10 MG tablet TAKE 1 TABLET BY MOUTH EVERY DAY Patient not taking: Reported on 06/15/2021 05/31/21   Saguier, Percell Miller, PA-C  azelastine (ASTELIN) 0.1 % nasal spray Place 2 sprays into both nostrils 2 (two) times daily. Use in each nostril as directed Patient not taking: Reported on 06/15/2021 02/24/20   Saguier, Percell Miller, PA-C  Biotin 10000 MCG TABS Take 10,000 mcg by mouth daily.  Patient not taking: Reported on 06/15/2021    [provider]  hydroxypropyl methylcellulose / hypromellose (ISOPTO TEARS / GONIOVISC) 2.5 % ophthalmic solution Place 1 drop into both eyes daily. Patient not taking: Reported on 06/15/2021    [provider]  levocetirizine (XYZAL) 5 MG tablet TAKE 1 TABLET BY MOUTH EVERY DAY IN THE EVENING Patient not taking: Reported on 06/15/2021 11/23/19   Saguier, Percell Miller, PA-C  Multiple Vitamins-Minerals (MULTIVITAMIN WITH MINERALS) tablet Take 1 tablet by mouth daily. Patient not taking: Reported on 06/15/2021    [provider]    Allergies    Dust mite extract, Levaquin [levofloxacin in d5w], Lidex [fluocinonide], Molds & smuts, Rifampin, Lactose, and Penicillins  Review of Systems   Review of  Systems  Constitutional:  Negative for diaphoresis.  HENT:  Negative for sore throat.   Eyes:  Negative for redness.  Respiratory:  Negative for cough and shortness of breath.   Cardiovascular:  Negative for chest pain.  Gastrointestinal:  Negative for abdominal pain, diarrhea and vomiting.  Genitourinary:  Negative for dysuria and flank pain.  Musculoskeletal:  Negative for back pain and neck pain.  Skin:  Negative for rash.  Neurological:  Positive for weakness. Negative for speech difficulty and headaches.  Hematological:  Does not bruise/bleed easily.  Psychiatric/Behavioral:  Negative for agitation.    Physical Exam Updated Vital Signs BP (!) 163/80 (BP Location: Right Arm)   Pulse 79   Temp 100.3 F (37.9 C) (Oral)   Resp 20   SpO2 92%   Physical Exam Vitals and nursing note reviewed.  Constitutional:      Appearance: Normal appearance. She is well-developed.  HENT:     Head: Atraumatic.     Nose: Nose normal.     Mouth/Throat:     Mouth: Mucous membranes are moist.  Eyes:  General: No scleral icterus.    Conjunctiva/sclera: Conjunctivae normal.     Pupils: Pupils are equal, round, and reactive to light.  Neck:     Trachea: No tracheal deviation.  Cardiovascular:     Rate and Rhythm: Normal rate and regular rhythm.     Pulses: Normal pulses.     Heart sounds: Normal heart sounds. No murmur heard.   No friction rub. No gallop.  Pulmonary:     Effort: Pulmonary effort is normal. No respiratory distress.     Breath sounds: Normal breath sounds.  Abdominal:     General: Bowel sounds are normal. There is no distension.     Palpations: Abdomen is soft.     Tenderness: There is abdominal tenderness. There is no guarding.     Comments: Mid abd tenderness, no peritonitis.   Genitourinary:    Comments: No cva tenderness.  Musculoskeletal:        General: No swelling or tenderness.     Cervical back: Normal range of motion and neck supple. No rigidity. No muscular  tenderness.  Skin:    General: Skin is warm and dry.     Findings: No rash.  Neurological:     Mental Status: She is alert.     Comments: Alert, speech normal, no gross dysarthria or aphasia. +HOH. Moves bilateral extremities purposefully. Motor/sens grossly intact bil.   Psychiatric:        Mood and Affect: Mood normal.    ED Results / Procedures / Treatments   Labs (all labs ordered are listed, but only abnormal results are displayed) Results for orders placed or performed during the hospital encounter of 10/25/21  Resp Panel by RT-PCR (Flu A&B, Covid) Nasopharyngeal Swab   Specimen: Nasopharyngeal Swab; Nasopharyngeal(NP) swabs in vial transport medium  Result Value Ref Range   SARS Coronavirus 2 by RT PCR NEGATIVE NEGATIVE   Influenza A by PCR NEGATIVE NEGATIVE   Influenza B by PCR NEGATIVE NEGATIVE  CBC  Result Value Ref Range   WBC 8.3 4.0 - 10.5 K/uL   RBC 4.10 3.87 - 5.11 MIL/uL   Hemoglobin 11.7 (L) 12.0 - 15.0 g/dL   HCT 36.3 36.0 - 46.0 %   MCV 88.5 80.0 - 100.0 fL   MCH 28.5 26.0 - 34.0 pg   MCHC 32.2 30.0 - 36.0 g/dL   RDW 14.6 11.5 - 15.5 %   Platelets 207 150 - 400 K/uL   nRBC 0.0 0.0 - 0.2 %  Comprehensive metabolic panel  Result Value Ref Range   Sodium 137 135 - 145 mmol/L   Potassium 3.7 3.5 - 5.1 mmol/L   Chloride 101 98 - 111 mmol/L   CO2 28 22 - 32 mmol/L   Glucose, Bld 114 (H) 70 - 99 mg/dL   BUN 11 8 - 23 mg/dL   Creatinine, Ser 0.67 0.44 - 1.00 mg/dL   Calcium 8.9 8.9 - 10.3 mg/dL   Total Protein 6.1 (L) 6.5 - 8.1 g/dL   Albumin 3.5 3.5 - 5.0 g/dL   AST 29 15 - 41 U/L   ALT 18 0 - 44 U/L   Alkaline Phosphatase 57 38 - 126 U/L   Total Bilirubin 1.1 0.3 - 1.2 mg/dL   GFR, Estimated >60 >60 mL/min   Anion gap 8 5 - 15  Lactic acid, plasma  Result Value Ref Range   Lactic Acid, Venous 0.9 0.5 - 1.9 mmol/L  Urinalysis, Routine w reflex microscopic  Result Value Ref Range  Color, Urine YELLOW YELLOW   APPearance CLEAR CLEAR   Specific  Gravity, Urine 1.010 1.005 - 1.030   pH 8.0 5.0 - 8.0   Glucose, UA NEGATIVE NEGATIVE mg/dL   Hgb urine dipstick NEGATIVE NEGATIVE   Bilirubin Urine NEGATIVE NEGATIVE   Ketones, ur NEGATIVE NEGATIVE mg/dL   Protein, ur NEGATIVE NEGATIVE mg/dL   Nitrite NEGATIVE NEGATIVE   Leukocytes,Ua SMALL (A) NEGATIVE  Lipase, blood  Result Value Ref Range   Lipase 32 11 - 51 U/L  Urinalysis, Microscopic (reflex)  Result Value Ref Range   RBC / HPF 0-5 0 - 5 RBC/hpf   WBC, UA 11-20 0 - 5 WBC/hpf   Bacteria, UA RARE (A) NONE SEEN   Squamous Epithelial / LPF 0-5 0 - 5    EKG EKG Interpretation  Date/Time:  Wednesday October 25 2021 22:49:02 EST Ventricular Rate:  80 PR Interval:  215 QRS Duration: 154 QT Interval:  407 QTC Calculation: 470 R Axis:   -88 Text Interpretation: Electronic ventricular pacemaker Confirmed by Lajean Saver [phone]) on 10/25/2021 11:07:42 PM  Radiology CT Abdomen Pelvis W Contrast  Result Date: 10/26/2021 CLINICAL DATA:  87 year old female with history of acute onset of nonlocalized abdominal pain, shortness of breath, weakness and generalized malaise. Mild fever. EXAM: CT ABDOMEN AND PELVIS WITH CONTRAST TECHNIQUE: Multidetector CT imaging of the abdomen and pelvis was performed using the standard protocol following bolus administration of intravenous contrast. CONTRAST:  160mL OMNIPAQUE IOHEXOL 300 MG/ML  SOLN COMPARISON:  CT of the abdomen and pelvis 04/07/2018. FINDINGS: Lower chest: Atherosclerotic calcifications in the descending thoracic aorta. Severe calcifications of the mitral annulus. Pacemaker leads in the right atrium and right ventricular apex. Scarring in the right lung base. Large hiatal hernia. Hepatobiliary: No suspicious cystic or solid hepatic lesions. There is some very mild intrahepatic biliary ductal dilatation isolated to predominantly segment 8 of the liver, which appears stable in retrospect compared to prior examinations, likely chronic and  benign in this older individual. No other significant intra or extrahepatic biliary ductal dilatation. Gallbladder is grossly unremarkable in appearance. Pancreas: No pancreatic mass. No pancreatic ductal dilatation. No pancreatic or peripancreatic fluid collections or inflammatory changes. Spleen: Unremarkable. Adrenals/Urinary Tract: 1.2 cm low-attenuation lesion in the upper pole of the right kidney is similar to the prior study and compatible with a small benign cyst. Left kidney and bilateral adrenal glands are normal in appearance. No hydroureteronephrosis. Urinary bladder is moderate to severely distended, but otherwise unremarkable in appearance. Stomach/Bowel: The intra-abdominal portion of the stomach is normal. There is no pathologic dilatation of small bowel or colon. Numerous colonic diverticulae are noted, without definite focal surrounding inflammatory changes to clearly indicate an acute diverticulitis at this time. The appendix is not confidently identified and may be surgically absent. Regardless, there are no inflammatory changes noted adjacent to the cecum to suggest the presence of an acute appendicitis at this time. Vascular/Lymphatic: Aortic atherosclerosis, without evidence of aneurysm or dissection in the abdominal or pelvic vasculature. No lymphadenopathy noted in the abdomen or pelvis. Reproductive: Uterus and ovaries are not confidently identified may be surgically absent or atrophic. Other: No significant volume of ascites.  No pneumoperitoneum. Musculoskeletal: There are no aggressive appearing lytic or blastic lesions noted in the visualized portions of the skeleton. IMPRESSION: 1. No acute findings are noted in the abdomen or pelvis to account for the patient's symptoms. 2. Colonic diverticulosis without definite imaging findings to indicate an acute diverticulitis at this time. 3. Moderate to  severe distension of the urinary bladder, which appears similar to the prior examination  from 04/07/2018. Clinical correlation for signs and symptoms of bladder outlet obstruction is suggested. 4. Aortic atherosclerosis. 5. Additional incidental findings, as above. Electronically Signed   By: Vinnie Langton M.D.   On: 10/26/2021 06:11   DG Chest Port 1 View  Result Date: 10/25/2021 CLINICAL DATA:  Weakness EXAM: PORTABLE CHEST 1 VIEW COMPARISON:  04/16/2020 FINDINGS: Lungs are clear. No pneumothorax or pleural effusion. Mild to moderate cardiomegaly is stable. Left subclavian dual lead pacemaker is unchanged. The thoracic aorta is tortuous and aneurysmal, but unchanged from prior examination. No acute bone abnormality IMPRESSION: No radiographic evidence of acute cardiopulmonary disease. Stable cardiomegaly and ectasia of the thoracic aorta Electronically Signed   By: Fidela Salisbury M.D.   On: 10/25/2021 23:27    Procedures Procedures   Medications Ordered in ED Medications - No data to display  ED Course  I have reviewed the triage vital signs and the nursing notes.  Pertinent labs & imaging results that were available during my care of the patient were reviewed by me and considered in my medical decision making (see chart for details).    MDM Rules/Calculators/A&P                           Iv ns. Continuous pulse ox and cardiac monitoring. Stat labs. Cultures. Cxr.   Reviewed nursing notes and prior charts for additional history.   Labs reviewed/interpreted by me - wbc and hct normal. Chem normal. UA w possible uti, 11-20 wbc. Rocephin iv. Flu and covid is negative.   CXR reviewed/interpreted by me - no pna.  Recheck pt, resting comfortably, no distress. Pt denies new c/o, no headache, no chest pain or discomfort, denies sob. CT abd remains pending.   CT reviewed/interpreted by me - no acute abd process.  Recheck, pt resting comfortably, no apparent pain or discomfort. O2 sats 97%.   Pt appears stable for d/c.   Rec pcp f/u.  Return precautions provided.         Final Clinical Impression(s) / ED Diagnoses Final diagnoses:  None    Rx / DC Orders ED Discharge Orders     None        Lajean Saver, MD 10/26/21 [phone]

## 2021-10-26 ENCOUNTER — Encounter (HOSPITAL_COMMUNITY): Payer: Self-pay

## 2021-10-26 ENCOUNTER — Emergency Department (HOSPITAL_COMMUNITY): Payer: Medicare HMO

## 2021-10-26 ENCOUNTER — Other Ambulatory Visit: Payer: Self-pay

## 2021-10-26 LAB — CBC
HCT: 36.3 % (ref 36.0–46.0)
Hemoglobin: 11.7 g/dL — ABNORMAL LOW (ref 12.0–15.0)
MCH: 28.5 pg (ref 26.0–34.0)
MCHC: 32.2 g/dL (ref 30.0–36.0)
MCV: 88.5 fL (ref 80.0–100.0)
Platelets: 207 10*3/uL (ref 150–400)
RBC: 4.1 MIL/uL (ref 3.87–5.11)
RDW: 14.6 % (ref 11.5–15.5)
WBC: 8.3 10*3/uL (ref 4.0–10.5)
nRBC: 0 % (ref 0.0–0.2)

## 2021-10-26 LAB — COMPREHENSIVE METABOLIC PANEL
ALT: 18 U/L (ref 0–44)
AST: 29 U/L (ref 15–41)
Albumin: 3.5 g/dL (ref 3.5–5.0)
Alkaline Phosphatase: 57 U/L (ref 38–126)
Anion gap: 8 (ref 5–15)
BUN: 11 mg/dL (ref 8–23)
CO2: 28 mmol/L (ref 22–32)
Calcium: 8.9 mg/dL (ref 8.9–10.3)
Chloride: 101 mmol/L (ref 98–111)
Creatinine, Ser: 0.67 mg/dL (ref 0.44–1.00)
GFR, Estimated: >60 mL/min (ref >60)
Glucose, Bld: 114 mg/dL — ABNORMAL HIGH (ref 70–99)
Potassium: 3.7 mmol/L (ref 3.5–5.1)
Sodium: 137 mmol/L (ref 135–145)
Total Bilirubin: 1.1 mg/dL (ref 0.3–1.2)
Total Protein: 6.1 g/dL — ABNORMAL LOW (ref 6.5–8.1)

## 2021-10-26 LAB — LIPASE, BLOOD: Lipase: 32 U/L (ref 11–51)

## 2021-10-26 LAB — URINALYSIS, MICROSCOPIC (REFLEX)

## 2021-10-26 LAB — URINALYSIS, ROUTINE W REFLEX MICROSCOPIC
Bilirubin Urine: NEGATIVE
Glucose, UA: NEGATIVE mg/dL
Hgb urine dipstick: NEGATIVE
Ketones, ur: NEGATIVE mg/dL
Nitrite: NEGATIVE
Protein, ur: NEGATIVE mg/dL
Specific Gravity, Urine: 1.01 (ref 1.005–1.030)
pH: 8 (ref 5.0–8.0)

## 2021-10-26 LAB — RESP PANEL BY RT-PCR (FLU A&B, COVID) ARPGX2
Influenza A by PCR: NEGATIVE
Influenza B by PCR: NEGATIVE
SARS Coronavirus 2 by RT PCR: NEGATIVE

## 2021-10-26 LAB — LACTIC ACID, PLASMA: Lactic Acid, Venous: 0.9 mmol/L (ref 0.5–1.9)

## 2021-10-26 MED ORDER — SODIUM CHLORIDE 0.9 % IV SOLN
1.0000 g | Freq: Once | INTRAVENOUS | Status: AC
  Administered 2021-10-26: 1 g via INTRAVENOUS
  Filled 2021-10-26: qty 10

## 2021-10-26 MED ORDER — IOHEXOL 300 MG/ML  SOLN
100.0000 mL | Freq: Once | INTRAMUSCULAR | Status: AC | PRN
  Administered 2021-10-26: 100 mL via INTRAVENOUS

## 2021-10-26 MED ORDER — CEPHALEXIN 500 MG PO CAPS: 500.0000 mg | ORAL_CAPSULE | Freq: Three times a day (TID) | ORAL | 0 refills | Status: DC

## 2021-10-26 NOTE — ED Notes (Signed)
Son called and given discharge instructions. Pt resting quietly on stretcher.

## 2021-10-26 NOTE — ED Notes (Signed)
Patient transported to CT 

## 2021-10-26 NOTE — ED Notes (Signed)
Assisted pt to walk to the BR. Tolerated fair

## 2021-10-26 NOTE — Discharge Instructions (Signed)
It was our pleasure to provide your ER care today - we hope that you feel better.  You lab tests show a possible urine infection - take antibiotic (keflex) as prescribed.   Follow up with primary care doctor in the next 1-2 weeks.   Return to ER if worse, new symptoms, new/severe pain, persistent vomiting, chest pain, trouble breathing, or other concern.

## 2021-10-26 NOTE — ED Notes (Signed)
Pt yelling out to go home. Continuing to reorient pt. Repositioned in bed for comfort

## 2021-10-26 NOTE — ED Notes (Signed)
Pt's son Louie Casa called to notify of pt arrival to ER and to update on POC.  Louie Casa (son) [phone]

## 2021-10-31 LAB — CULTURE, BLOOD (ROUTINE X 2): Culture: NO GROWTH

## 2022-02-05 ENCOUNTER — Telehealth: Payer: Self-pay | Admitting: Medical

## 2022-02-05 NOTE — Telephone Encounter (Signed)
Spoke with son Louie Casa), pt is due for CPE- pt's son will call to schedule appt.  ?

## 2022-04-21 ENCOUNTER — Encounter (HOSPITAL_BASED_OUTPATIENT_CLINIC_OR_DEPARTMENT_OTHER): Payer: Self-pay

## 2022-04-21 ENCOUNTER — Emergency Department (HOSPITAL_BASED_OUTPATIENT_CLINIC_OR_DEPARTMENT_OTHER)
Admission: EM | Admit: 2022-04-21 | Discharge: 2022-04-21 | Discharge status: Against Medical Advice | Payer: Medicare HMO | Attending: Emergency Medicine | Admitting: Emergency Medicine

## 2022-04-21 ENCOUNTER — Other Ambulatory Visit: Payer: Self-pay

## 2022-04-21 DIAGNOSIS — L03116 Cellulitis of left lower limb: Secondary | ICD-10-CM

## 2022-04-21 DIAGNOSIS — M7989 Other specified soft tissue disorders: Secondary | ICD-10-CM | POA: Diagnosis present

## 2022-04-21 DIAGNOSIS — J449 Chronic obstructive pulmonary disease, unspecified: Secondary | ICD-10-CM | POA: Insufficient documentation

## 2022-04-21 DIAGNOSIS — R0902 Hypoxemia: Secondary | ICD-10-CM | POA: Diagnosis not present

## 2022-04-21 HISTORY — DX: Unspecified dementia, unspecified severity, without behavioral disturbance, psychotic disturbance, mood disturbance, and anxiety: F03.90

## 2022-04-21 LAB — CBC WITH DIFFERENTIAL/PLATELET
Abs Immature Granulocytes: 0.01 10*3/uL (ref 0.00–0.07)
Basophils Absolute: 0 10*3/uL (ref 0.0–0.1)
Basophils Relative: 1 %
Eosinophils Absolute: 0.1 10*3/uL (ref 0.0–0.5)
Eosinophils Relative: 1 %
HCT: 40 % (ref 36.0–46.0)
Hemoglobin: 12.3 g/dL (ref 12.0–15.0)
Immature Granulocytes: 0 %
Lymphocytes Relative: 18 %
Lymphs Abs: 1.1 10*3/uL (ref 0.7–4.0)
MCH: 27.5 pg (ref 26.0–34.0)
MCHC: 30.8 g/dL (ref 30.0–36.0)
MCV: 89.5 fL (ref 80.0–100.0)
Monocytes Absolute: 0.7 10*3/uL (ref 0.1–1.0)
Monocytes Relative: 12 %
Neutro Abs: 4.2 10*3/uL (ref 1.7–7.7)
Neutrophils Relative %: 68 %
Platelets: 191 10*3/uL (ref 150–400)
RBC: 4.47 MIL/uL (ref 3.87–5.11)
RDW: 14.9 % (ref 11.5–15.5)
WBC: 6 10*3/uL (ref 4.0–10.5)
nRBC: 0 % (ref 0.0–0.2)

## 2022-04-21 LAB — BASIC METABOLIC PANEL
Anion gap: 9 (ref 5–15)
BUN: 21 mg/dL (ref 8–23)
CO2: 28 mmol/L (ref 22–32)
Calcium: 9.8 mg/dL (ref 8.9–10.3)
Chloride: 105 mmol/L (ref 98–111)
Creatinine, Ser: 0.66 mg/dL (ref 0.44–1.00)
GFR, Estimated: >60 mL/min (ref >60)
Glucose, Bld: 102 mg/dL — ABNORMAL HIGH (ref 70–99)
Potassium: 3.6 mmol/L (ref 3.5–5.1)
Sodium: 142 mmol/L (ref 135–145)

## 2022-04-21 MED ORDER — CEPHALEXIN 250 MG PO CAPS
500.0000 mg | ORAL_CAPSULE | Freq: Once | ORAL | Status: AC
  Administered 2022-04-21: 500 mg via ORAL
  Filled 2022-04-21: qty 2

## 2022-04-21 MED ORDER — CEPHALEXIN 500 MG PO CAPS: 500.0000 mg | ORAL_CAPSULE | Freq: Three times a day (TID) | ORAL | 0 refills | Status: AC

## 2022-04-21 NOTE — ED Provider Notes (Signed)
[facility] EMERGENCY DEPT Provider Note   CSN: [id number] Arrival date & time: 04/21/22  1515     History  Chief Complaint  Patient presents with   Wound Check    Tracey Holt is a 88 y.o. female.  Patient is a 88 yo female presenting with son from home for wound. Patient admits to scab on left anterior shin that has been healing. States she starting having redness and swelling around wound that started yesterday. Denies any fevers, chills, myalgias, or nausea. No recent antibiotic use. Went to urgent care prior to arrival and was sent to ED due to hypoxia at 88%. Patient's son states she typically runs low secondary to COPD. No home oxygen use. Is otherwise at her baseline. Denies hx of coughing or URI symptoms.   The history is provided by the patient. No language interpreter was used.  Wound Check Pertinent negatives include no chest pain, no abdominal pain and no shortness of breath.      Home Medications Prior to Admission medications   Medication Sig Start Date End Date Taking? Authorizing Provider  cephALEXin (KEFLEX) 500 MG capsule Take 1 capsule (500 mg total) by mouth 3 (three) times daily for 7 days. 04/21/22 9/47/09 Yes Campbell Stall P, DO  amLODipine (NORVASC) 2.5 MG tablet TAKE 1 TABLET BY MOUTH EVERY DAY Patient not taking: Reported on 06/15/2021 04/10/21   Saguier, Percell Miller, PA-C  Ascorbic Acid (VITAMIN C) 1000 MG tablet Take 1,000 mg by mouth daily. Patient not taking: Reported on 06/15/2021    [provider]  atorvastatin (LIPITOR) 10 MG tablet TAKE 1 TABLET BY MOUTH EVERY DAY Patient not taking: Reported on 06/15/2021 05/31/21   Saguier, Percell Miller, PA-C  azelastine (ASTELIN) 0.1 % nasal spray Place 2 sprays into both nostrils 2 (two) times daily. Use in each nostril as directed Patient not taking: Reported on 06/15/2021 02/24/20   Saguier, Percell Miller, PA-C  Biotin 10000 MCG TABS Take 10,000 mcg by mouth daily.  Patient not taking: Reported on 06/15/2021     [provider]  hydroxypropyl methylcellulose / hypromellose (ISOPTO TEARS / GONIOVISC) 2.5 % ophthalmic solution Place 1 drop into both eyes daily. Patient not taking: Reported on 06/15/2021    [provider]  levocetirizine (XYZAL) 5 MG tablet TAKE 1 TABLET BY MOUTH EVERY DAY IN THE EVENING Patient not taking: Reported on 06/15/2021 11/23/19   Saguier, Percell Miller, PA-C  Multiple Vitamins-Minerals (MULTIVITAMIN WITH MINERALS) tablet Take 1 tablet by mouth daily. Patient not taking: Reported on 06/15/2021    [provider]      Allergies    Dust mite extract, Levaquin [levofloxacin in d5w], Lidex [fluocinonide], Molds & smuts, Rifampin, Lactose, and Penicillins    Review of Systems   Review of Systems  Constitutional:  Negative for chills and fever.  HENT:  Negative for ear pain and sore throat.   Eyes:  Negative for pain and visual disturbance.  Respiratory:  Negative for cough and shortness of breath.   Cardiovascular:  Negative for chest pain and palpitations.  Gastrointestinal:  Negative for abdominal pain and vomiting.  Genitourinary:  Negative for dysuria and hematuria.  Musculoskeletal:  Negative for arthralgias and back pain.  Skin:  Positive for rash and wound. Negative for color change.  Neurological:  Negative for seizures and syncope.  All other systems reviewed and are negative.  Physical Exam Updated Vital Signs BP (!) 162/92 (BP Location: Right Arm)   Pulse 70   Temp 98.7 F (37.1 C) (  Oral)   Resp (!) 24   Ht '4\' 6"'$  (1.372 m)   Wt 44.6 kg   SpO2 94%   BMI 23.71 kg/m  Physical Exam Vitals and nursing note reviewed.  Constitutional:      General: She is not in acute distress.    Appearance: She is well-developed.  HENT:     Head: Normocephalic and atraumatic.  Eyes:     Conjunctiva/sclera: Conjunctivae normal.  Cardiovascular:     Rate and Rhythm: Normal rate and regular rhythm.     Heart sounds: No murmur heard. Pulmonary:      Effort: Pulmonary effort is normal. No respiratory distress.     Breath sounds: Normal breath sounds.  Abdominal:     Palpations: Abdomen is soft.     Tenderness: There is no abdominal tenderness.  Musculoskeletal:        General: No swelling.     Cervical back: Neck supple.  Skin:    General: Skin is warm and dry.     Capillary Refill: Capillary refill takes less than 2 seconds.     Findings: Rash present.       Neurological:     Mental Status: She is alert.  Psychiatric:        Mood and Affect: Mood normal.    ED Results / Procedures / Treatments   Labs (all labs ordered are listed, but only abnormal results are displayed) Labs Reviewed  BASIC METABOLIC PANEL - Abnormal; Notable for the following components:      Result Value   Glucose, Bld 102 (*)    All other components within normal limits  CBC WITH DIFFERENTIAL/PLATELET    EKG None  Radiology No results found.  Procedures Procedures    Medications Ordered in ED Medications  cephALEXin (KEFLEX) capsule 500 mg (500 mg Oral Given 04/21/22 1817)    ED Course/ Medical Decision Making/ A&P                           Medical Decision Making Amount and/or Complexity of Data Reviewed Labs: ordered.  Risk Prescription drug management.   3:03 PM 88 yo female presenting with son from home for wound. Pt is [place], no acute distress, afebrile, with hypoxia at 88%, with otherwise stable vitals. Physical exam demonstrates 1 x 1 cm eschar surrounding by erythema starting at the mid shin and ending before the foot demonstrating cellulitis. Antibiotics given in ED and sent to pharmacy. No swelling. No blistering. Sensation and motor function intact. No signs of necrotizing fascitis. No sepsis.  Patient and son deny hx of sob, fever, chills, coughing, or URI symtpoms. Pt's son states oxygen normally runs  low due to hx of COPD. Never started on oxygen. Declining further work up for hypoxia stating this is patient's baseline  and has no sob, wheezing, coughing, or respiratory symptoms. Agrees to follow up with primary care next week for recheck.  Patient in no distress and overall condition improved here in the ED. Detailed discussions were had with the patient regarding current findings, and need for close f/u with PCP or on call doctor. The patient has been instructed to return immediately if the symptoms worsen in any way for re-evaluation. Patient verbalized understanding and is in agreement with current care plan. All questions answered prior to discharge.         Final Clinical Impression(s) / ED Diagnoses Final diagnoses:  Hypoxia  Cellulitis of left lower extremity  Rx / DC Orders ED Discharge Orders          Ordered    cephALEXin (KEFLEX) 500 MG capsule  3 times daily        04/21/22 1802              Lianne Cure, DO 46/04/79 1503

## 2022-04-21 NOTE — ED Notes (Signed)
Pt reports SOB. 

## 2022-04-21 NOTE — Discharge Instructions (Signed)
Please return to ED if leg rash-cellulitis worsens or for if you decide you want to be seen for your hypoxia ( low oxygren levels).

## 2022-04-21 NOTE — ED Notes (Signed)
Pt's son requested to leave and agreed to sign AMA papers. Pt's son signed and was advised to bring Pt back to follow up with her hypoxia in triage.

## 2022-04-21 NOTE — ED Triage Notes (Signed)
Pt is on R/A at baseline, but has an SpO2 of 88%. Tachypnea noted.  Pt placed on 2L via N/C.

## 2022-04-21 NOTE — ED Triage Notes (Signed)
Pt BIB family for a wound on L anterior lower leg. Wound has localized erythema around it and is scabbed over. Pt sent by UC for evaluation.

## 2022-06-27 ENCOUNTER — Telehealth: Payer: Self-pay

## 2022-06-27 NOTE — Telephone Encounter (Signed)
I called [facility] for additional help with the patient monitor. The monitor is unable to be read by her monitor. [facility] is going to reach out to me for the next steps. The patient may need to come into the office for RF telemetry.

## 2022-07-16 ENCOUNTER — Encounter: Payer: Medicare HMO | Admitting: Cardiovascular Disease

## 2022-07-31 ENCOUNTER — Encounter (HOSPITAL_COMMUNITY): Payer: Self-pay

## 2022-07-31 ENCOUNTER — Emergency Department (HOSPITAL_COMMUNITY): Payer: Medicare HMO

## 2022-07-31 ENCOUNTER — Emergency Department (HOSPITAL_COMMUNITY)
Admission: EM | Admit: 2022-07-31 | Discharge: 2022-07-31 | Disposition: A | Discharge status: Skilled Nursing Facility | Payer: Medicare HMO | Attending: Emergency Medicine | Admitting: Emergency Medicine

## 2022-07-31 DIAGNOSIS — Z950 Presence of cardiac pacemaker: Secondary | ICD-10-CM | POA: Insufficient documentation

## 2022-07-31 DIAGNOSIS — S0083XA Contusion of other part of head, initial encounter: Secondary | ICD-10-CM | POA: Insufficient documentation

## 2022-07-31 DIAGNOSIS — F05 Delirium due to known physiological condition: Secondary | ICD-10-CM

## 2022-07-31 DIAGNOSIS — Y92129 Unspecified place in nursing home as the place of occurrence of the external cause: Secondary | ICD-10-CM | POA: Insufficient documentation

## 2022-07-31 DIAGNOSIS — F0390 Unspecified dementia without behavioral disturbance: Secondary | ICD-10-CM | POA: Diagnosis not present

## 2022-07-31 DIAGNOSIS — S61512A Laceration without foreign body of left wrist, initial encounter: Secondary | ICD-10-CM | POA: Insufficient documentation

## 2022-07-31 DIAGNOSIS — W19XXXA Unspecified fall, initial encounter: Secondary | ICD-10-CM | POA: Diagnosis not present

## 2022-07-31 DIAGNOSIS — S6992XA Unspecified injury of left wrist, hand and finger(s), initial encounter: Secondary | ICD-10-CM | POA: Diagnosis present

## 2022-07-31 LAB — BASIC METABOLIC PANEL
Anion gap: 7 (ref 5–15)
BUN: 21 mg/dL (ref 8–23)
CO2: 26 mmol/L (ref 22–32)
Calcium: 9.5 mg/dL (ref 8.9–10.3)
Chloride: 106 mmol/L (ref 98–111)
Creatinine, Ser: 0.69 mg/dL (ref 0.44–1.00)
GFR, Estimated: >60 mL/min (ref >60)
Glucose, Bld: 93 mg/dL (ref 70–99)
Potassium: 4.1 mmol/L (ref 3.5–5.1)
Sodium: 139 mmol/L (ref 135–145)

## 2022-07-31 LAB — URINALYSIS, ROUTINE W REFLEX MICROSCOPIC
Bilirubin Urine: NEGATIVE
Glucose, UA: NEGATIVE mg/dL
Hgb urine dipstick: NEGATIVE
Ketones, ur: NEGATIVE mg/dL
Leukocytes,Ua: NEGATIVE
Nitrite: NEGATIVE
Protein, ur: NEGATIVE mg/dL
Specific Gravity, Urine: 1.006 (ref 1.005–1.030)
pH: 5 (ref 5.0–8.0)

## 2022-07-31 LAB — CBC WITH DIFFERENTIAL/PLATELET
Abs Immature Granulocytes: 0.02 10*3/uL (ref 0.00–0.07)
Basophils Absolute: 0 10*3/uL (ref 0.0–0.1)
Basophils Relative: 1 %
Eosinophils Absolute: 0.1 10*3/uL (ref 0.0–0.5)
Eosinophils Relative: 1 %
HCT: 37.6 % (ref 36.0–46.0)
Hemoglobin: 11.5 g/dL — ABNORMAL LOW (ref 12.0–15.0)
Immature Granulocytes: 0 %
Lymphocytes Relative: 18 %
Lymphs Abs: 1.1 10*3/uL (ref 0.7–4.0)
MCH: 27.8 pg (ref 26.0–34.0)
MCHC: 30.6 g/dL (ref 30.0–36.0)
MCV: 90.8 fL (ref 80.0–100.0)
Monocytes Absolute: 0.8 10*3/uL (ref 0.1–1.0)
Monocytes Relative: 14 %
Neutro Abs: 3.9 10*3/uL (ref 1.7–7.7)
Neutrophils Relative %: 66 %
Platelets: 219 10*3/uL (ref 150–400)
RBC: 4.14 MIL/uL (ref 3.87–5.11)
RDW: 15.4 % (ref 11.5–15.5)
WBC: 5.9 10*3/uL (ref 4.0–10.5)
nRBC: 0 % (ref 0.0–0.2)

## 2022-07-31 NOTE — ED Provider Notes (Signed)
[facility] DEPT Provider Note   CSN: [id number] Arrival date & time: 07/31/22  1431     History  Chief Complaint  Patient presents with   Altered Mental Status   Fall    Tracey Holt is a 88 y.o. female.  Patient is a 88 year old female with a history of sarcoidosis, cardiomyopathy, pacemaker, dementia who lives at [facility] screens and is presenting today with her son who is also her power of attorney due to a fall, facial injury and some change in mental status.  Patient's son gives all the history.  He reports that he saw her yesterday and she was her normal self but she has been having worsening memory over the years.  This morning she walked down to the front desk and told them she had fallen and she had significant bruising over the right side of her eye.  She otherwise seems to be her normal self and they called him around noon or 1 and when he got over there she had looked at her face in the mirror and became very concerned.  He does report that at times she is seeing things that are off the wall and he is not sure how she fell and she cannot remember.  She denies any chest pain, shortness of breath, nausea, vomiting.  She does not take any medications at this time.  He reports sometimes she will have bad days where she is like this and then quickly snaps out of it. They also noted a skin tear to the left arm  The history is provided by the patient and a relative.  Altered Mental Status Fall       Home Medications Prior to Admission medications   Medication Sig Start Date End Date Taking? Authorizing Provider  amLODipine (NORVASC) 2.5 MG tablet TAKE 1 TABLET BY MOUTH EVERY DAY Patient not taking: Reported on 06/15/2021 04/10/21   Saguier, Percell Miller, PA-C  Ascorbic Acid (VITAMIN C) 1000 MG tablet Take 1,000 mg by mouth daily. Patient not taking: Reported on 06/15/2021    [provider]  atorvastatin (LIPITOR) 10 MG tablet TAKE 1 TABLET BY  MOUTH EVERY DAY Patient not taking: Reported on 06/15/2021 05/31/21   Saguier, Percell Miller, PA-C  azelastine (ASTELIN) 0.1 % nasal spray Place 2 sprays into both nostrils 2 (two) times daily. Use in each nostril as directed Patient not taking: Reported on 06/15/2021 02/24/20   Saguier, Percell Miller, PA-C  Biotin 10000 MCG TABS Take 10,000 mcg by mouth daily.  Patient not taking: Reported on 06/15/2021    [provider]  hydroxypropyl methylcellulose / hypromellose (ISOPTO TEARS / GONIOVISC) 2.5 % ophthalmic solution Place 1 drop into both eyes daily. Patient not taking: Reported on 06/15/2021    [provider]  levocetirizine (XYZAL) 5 MG tablet TAKE 1 TABLET BY MOUTH EVERY DAY IN THE EVENING Patient not taking: Reported on 06/15/2021 11/23/19   Saguier, Percell Miller, PA-C  Multiple Vitamins-Minerals (MULTIVITAMIN WITH MINERALS) tablet Take 1 tablet by mouth daily. Patient not taking: Reported on 06/15/2021    [provider]      Allergies    Dust mite extract, Levaquin [levofloxacin in d5w], Lidex [fluocinonide], Molds & smuts, Rifampin, Lactose, and Penicillins    Review of Systems   Review of Systems  Physical Exam Updated Vital Signs BP (!) 145/91   Pulse 80   Temp 98.7 F (37.1 C) (Oral)   Resp (!) 21   SpO2 90%  Physical Exam Vitals and  nursing note reviewed.  Constitutional:      General: She is not in acute distress.    Appearance: She is well-developed.  HENT:     Head: Normocephalic.   Eyes:     Pupils: Pupils are equal, round, and reactive to light.  Cardiovascular:     Rate and Rhythm: Normal rate and regular rhythm.     Heart sounds: Normal heart sounds. No murmur heard.    No friction rub.  Pulmonary:     Effort: Pulmonary effort is normal.     Breath sounds: Normal breath sounds. No wheezing or rales.  Abdominal:     General: Bowel sounds are normal. There is no distension.     Palpations: Abdomen is soft.     Tenderness: There is no abdominal  tenderness. There is no guarding or rebound.  Musculoskeletal:        General: No tenderness. Normal range of motion.     Cervical back: Normal range of motion and neck supple.     Comments: No edema.  Superficial skin tear noted to the left wrist  Skin:    General: Skin is warm and dry.     Findings: No rash.  Neurological:     Mental Status: She is alert.     Cranial Nerves: No cranial nerve deficit.     Comments: Oriented to self but confused.  Has trouble finding appropriate words.  Is able to walk without difficulty and no unilateral deficits  Psychiatric:        Behavior: Behavior normal.     Comments: Calm and cooperative.     ED Results / Procedures / Treatments   Labs (all labs ordered are listed, but only abnormal results are displayed) Labs Reviewed  CBC WITH DIFFERENTIAL/PLATELET - Abnormal; Notable for the following components:      Result Value   Hemoglobin 11.5 (*)    All other components within normal limits  URINALYSIS, ROUTINE W REFLEX MICROSCOPIC - Abnormal; Notable for the following components:   Color, Urine STRAW (*)    All other components within normal limits  BASIC METABOLIC PANEL    EKG None  Radiology CT Head Wo Contrast  Result Date: 07/31/2022 CLINICAL DATA:  Head trauma, moderate-severe EXAM: CT HEAD WITHOUT CONTRAST TECHNIQUE: Contiguous axial images were obtained from the base of the skull through the vertex without intravenous contrast. RADIATION DOSE REDUCTION: This exam was performed according to the departmental dose-optimization program which includes automated exposure control, adjustment of the mA and/or kV according to patient size and/or use of iterative reconstruction technique. COMPARISON:  CT head December 24, 2018. FINDINGS: Brain: No evidence of acute infarction, hemorrhage, hydrocephalus, extra-axial collection or mass lesion/mass effect. Patchy white matter hypodensities, nonspecific but compatible with chronic microvascular  ischemic disease. Vascular: Calcific intracranial atherosclerosis. No hyperdense vessel identified. Skull: No acute fracture. Sinuses/Orbits: No acute findings. Other: No mastoid effusions. IMPRESSION: No evidence of acute intracranial abnormality. Electronically Signed   By: Margaretha Sheffield M.D.   On: 07/31/2022 15:33    Procedures Procedures    Medications Ordered in ED Medications - No data to display  ED Course/ Medical Decision Making/ A&P                           Medical Decision Making Amount and/or Complexity of Data Reviewed External Data Reviewed: notes. Labs: ordered. Decision-making details documented in ED Course. Radiology: ordered and independent interpretation performed. Decision-making details documented  in ED Course.   Pt with multiple medical problems and comorbidities and presenting today with a complaint that caries a high risk for morbidity and mortality.  Here today after a fall that was unwitnessed and that the patient cannot remember what happened overnight.  Patient does have trauma to the left side of her face but takes no anticoagulation and is otherwise well-appearing.  She is able to ambulate without difficulty and her son has reported she has said some things that do not make sense but overall otherwise seems to be herself.  She takes no medications.  He saw her yesterday and she was normal making it lower suspicion for infectious etiology or electrolyte abnormality.  Patient's vital signs are normal here.  She is pleasantly confused at this time but is having some word finding difficulty.  She does not seem frustrated but does seems to know her words don't make sense.  I have independently visualized and interpreted pt's images today.  CT is negative for intracranial hemorrhage.  Radiology reports no acute findings.  Findings discussed with the patient and her son.  At this time appears that patient is stable for discharge home.  6:13 PM I independently  interpreted patient's labs and her CBC, CMP and UA all without acute findings.  Discussed this with the patient's son and her POA.  Also discussed the word finding issues could be related to his stroke however he reports he would like to take her home, watch her closely and let her get food and a good night sleep and reassess tomorrow.  Patient has no social determinants affecting her discharge today and she was discharged home in good condition with her son.          Final Clinical Impression(s) / ED Diagnoses Final diagnoses:  Fall, initial encounter  Facial contusion, initial encounter  Acute confusional state    Rx / DC Orders ED Discharge Orders     None         Blanchie Dessert, MD 07/31/22 1813

## 2022-07-31 NOTE — ED Triage Notes (Signed)
Pt from [facility] with son who reports pt had a fall sometime overnight. Pt with bruising noted to L eye. Oriented to person only (son states she has trouble with orientation from time to time but this is somewhat different). Pt not anticoagulated. During triage pt noted to be 88-89% on RA. Placed on 2 lpm O2 via [place] and improved to 97%.

## 2022-07-31 NOTE — Discharge Instructions (Addendum)
All the blood work and urine today look normal.  The CAT scan was negative.  However she continues to decline you can return to the emergency room or follow-up with her doctor for recheck.

## 2022-07-31 NOTE — ED Provider Triage Note (Signed)
Emergency Medicine Provider Triage Evaluation Note  Tracey Holt , a 88 y.o. female  was evaluated in triage.  Pt complains of fall.  [facility] lives and [facility] with [facility].  Fall happened at approximately 10 AM today.  Patient does have ecchymosis around the left eye, and is more confused than her baseline per son who was present..  Review of Systems  Positive: Confusion, fall Negative: Weakness  Physical Exam  BP 124/89 (BP Location: Left Arm)   Pulse 81   Temp 98.7 F (37.1 C) (Oral)   Resp 16   SpO2 98%  Gen:   Awake, no distress   Resp:  Normal effort  MSK:   Moves extremities without difficulty  Other:  Patient is confused and answers questions inappropriately  Medical Decision Making  Medically screening exam initiated at 3:08 PM.  Appropriate orders placed.  Tracey Holt was informed that the remainder of the evaluation will be completed by another provider, this initial triage assessment does not replace that evaluation, and the importance of remaining in the ED until their evaluation is complete.  CT head ordered, patient will be brought back to room immediately.   Tracey Holt, [place] 07/31/22 1510

## 2022-08-27 ENCOUNTER — Encounter (HOSPITAL_COMMUNITY)
Admission: RE | Disposition: A | Discharge status: Home / Self Care | Payer: Self-pay | Source: Ambulatory Visit | Attending: Cardiovascular Disease

## 2022-08-27 ENCOUNTER — Ambulatory Visit (HOSPITAL_COMMUNITY)
Admission: RE | Admit: 2022-08-27 | Discharge: 2022-08-27 | Disposition: A | Discharge status: Home / Self Care | Payer: Medicare HMO | Source: Ambulatory Visit | Attending: Cardiovascular Disease | Admitting: Cardiovascular Disease

## 2022-08-27 ENCOUNTER — Ambulatory Visit: Payer: Medicare HMO | Admitting: Medical

## 2022-08-27 ENCOUNTER — Ambulatory Visit: Payer: Medicare HMO | Attending: Cardiovascular Disease | Admitting: Cardiovascular Disease

## 2022-08-27 ENCOUNTER — Other Ambulatory Visit: Payer: Self-pay | Admitting: *Deleted

## 2022-08-27 ENCOUNTER — Encounter: Payer: Self-pay | Admitting: Cardiovascular Disease

## 2022-08-27 VITALS — BP 139/75 | HR 77 | Ht <= 58 in | Wt 94.4 lb

## 2022-08-27 DIAGNOSIS — Z45010 Encounter for checking and testing of cardiac pacemaker pulse generator [battery]: Secondary | ICD-10-CM | POA: Diagnosis not present

## 2022-08-27 DIAGNOSIS — F03B0 Unspecified dementia, moderate, without behavioral disturbance, psychotic disturbance, mood disturbance, and anxiety: Secondary | ICD-10-CM

## 2022-08-27 DIAGNOSIS — I442 Atrioventricular block, complete: Secondary | ICD-10-CM | POA: Diagnosis not present

## 2022-08-27 SURGERY — PPM GENERATOR CHANGEOUT

## 2022-08-27 NOTE — H&P (Signed)
Tracey Holt is a 88 y.o. female with a hx of complete heart block presenting as cardiac arrest, complicated by transient stress cardiomyopathy [facility] [place] [facility], 2017) presents today for a pacemaker check and is found to have reached end of service as of Apr 13, 2022.  Thankfully the device is still providing atrial sensed, ventricular paced rhythm and she is not symptomatic.   She has worsening dementia and her son reports that she will soon be moving to the memory unit of her nursing facility.  She has not described any problems with shortness of breath, chest discomfort, angina, palpitations, syncope, lower extremity edema.   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  For the last couple of months she has not been receiving any antihypertensive medications and her blood pressure remains acceptable systolic blood pressure around 140.  She is no longer taking any lipid-lowering medications either.   They have been unable to perform any remote downloads on her pacemaker since October 2022.  They are in the process of troubleshooting that with with the [facility] service.  At the last download performed September 14, 2021 the device still had 3 years of anticipated generator longevity.  Pacing capture thresholds were low.  It is unclear what the device progressed to ERI so fast.  She has only 32% atrial pacing, but has 100% ventricular pacing.  Current battery voltage is 2.51 V (ERI 2.60 V was met on May 8 and end of service on May 24).  Several very brief episodes of atrial mode switch lasting 16 seconds or less are recorded, all suggestive of paroxysmal atrial tachycardia.  The overall burden of mode switches less than 1%.       Past Medical History:  Diagnosis Date   Bradycardic cardiac arrest ([place]) 07/24/2016    Required CPR, intubation with multiple rib fractures. Had short term cardiac shock with  acute heart failure.  Resulted in possible stress-induced cardiomyopathy   Cardiac related syncope 07/24/2016    Bradycardic arrest requiring CPR 2 with transvenous pacemaker placed followed by permanent pacemaker; complicated by respiratory arrest, type II MI, stress-induced cardiopathy   Cardiomyopathy ([place]) 07/2016    Moderate severely reduced EF with apical hypokinesis suggestive of stress-induced cardiac myopathy versus multivessel CAD --> in setting of her to cardiac arrest   COPD (chronic obstructive pulmonary disease) ([place])      By report   Dementia ([place])     History of chronic constipation     Hypercholesterolemia     Osteoporosis     Pacemaker 07/26/2016    Abbott dual-chamber pacemaker: Abbott Assurity Model #ZT24580 - Serial #[id number]   Pneumonia 08/2016   Sarcoidosis of lung [facility])     Spinal stenosis             Past Surgical History:  Procedure Laterality Date   ADENOIDECTOMY       PACEMAKER INSERTION Left 07/26/2016    Abbott Assurity Model #NK53976 - Serial #[id number] -- complicated by pneumothorax requiring chest tube placement   TEE WITHOUT CARDIOVERSION   02/2013    EF 55-60% with normal global function. No thrombus, mass or vegetation. Trivial-small pericardial effusion. Moderate LA dilation. Mild MR.   TONSILLECTOMY       TRANSTHORACIC ECHOCARDIOGRAM   07/24/2016    [facility]: EF 45-50%. Mid and apical inferior, inferolateral and apical hypokinesis. GR 2 DD. Mild LA and moderate RA dilation. Mild paracardial effusion. Moderate to severe TR.  Basal and mid segment hypokinesis with apical hypokinesis. - Suspect stress-induced cardiopathy versus multivessel CAD.   TRANSTHORACIC ECHOCARDIOGRAM   07/26/2016    [facility]: LIMITED (post PPM) - although the report suggested EF 30-35% with apical akinesis, rounding note suggested this was an improvement      Current Medications: Active Medications  No outpatient medications  have been marked as taking for the 08/27/22 encounter (Office Visit) with [place], Dani Gobble, MD.        Allergies:   Dust mite extract, Levaquin [levofloxacin in d5w], Lidex [fluocinonide], Molds & smuts, Rifampin, Lactose, and Penicillins    Social History         Socioeconomic History   Marital status: Widowed      Spouse name: Not on file   Number of children: Not on file   Years of education: Not on file   Highest education level: Not on file  Occupational History   Not on file  Tobacco Use   Smoking status: Former      Packs/day: 1.50      Years: 17.00      Total pack years: 25.50      Types: Cigarettes      Start date: 12/18/1963      Quit date: 11/20/1983      Years since quitting: 38.7   Smokeless tobacco: Never  Vaping Use   Vaping Use: Never used  Substance and Sexual Activity   Alcohol use: No   Drug use: No   Sexual activity: Not on file  Other Topics Concern   Not on file  Social History Narrative    Recently moved to [place] from [place] to live near her daughter.    Social Determinants of Health    Financial Resource Strain: Not on file  Food Insecurity: Not on file  Transportation Needs: Not on file  Physical Activity: Not on file  Stress: Not on file  Social Connections: Not on file      Family History: The patient's family history is negative for Allergic rhinitis, Angioedema, Asthma, Eczema, Immunodeficiency, and Urticaria.   ROS:   Please see the history of present illness.     All other systems reviewed and are negative.   EKGs/Labs/Other Studies Reviewed:     The following studies were reviewed today: Comprehensive pacemaker check today.  Pacing thresholds were not tested to avoid battery drain.   EKG:  EKG is ordered today.  The ekg ordered today demonstrates atrial paced, ventricular sensed rhythm   Recent Labs: 10/26/2021: ALT 18 07/31/2022: BUN 21; Creatinine, Ser 0.69; Hemoglobin 11.5; Platelets 219; Potassium 4.1; Sodium  139  Recent Lipid Panel Labs (Brief)          Component Value Date/Time    CHOL 145 09/19/2017 1450    TRIG 72.0 09/19/2017 1450    HDL 69.40 09/19/2017 1450    CHOLHDL 2 09/19/2017 1450    VLDL 14.4 09/19/2017 1450    LDLCALC 61 09/19/2017 1450          Risk Assessment/Calculations:                   Physical Exam:     VS:  BP 139/75   Pulse 77   Ht 4' (1.219 m)   Wt 94 lb 6.4 oz (42.8 kg)   SpO2 98%   BMI 28.81 kg/m         Wt Readings from Last 3 Encounters:  08/27/22 94 lb 6.4 oz (  42.8 kg)  04/21/22 98 lb 5.2 oz (44.6 kg)  06/15/21 91 lb (41.3 kg)      GEN: Very slender body habitus well nourished, well developed in no acute distress.  The pacemaker site in the left subclavian area appears healthy, although the skin is very thin.  There is no redness, tenderness or swelling. HEENT: Normal NECK: No JVD; No carotid bruits LYMPHATICS: No lymphadenopathy CARDIAC: RRR, no murmurs, rubs, gallops RESPIRATORY:  Clear to auscultation without rales, wheezing or rhonchi  ABDOMEN: Soft, non-tender, non-distended MUSCULOSKELETAL:  No edema; No deformity  SKIN: Warm and dry NEUROLOGIC: She is hard of hearing.  She holds short conversations but appears to be disoriented and confused. PSYCHIATRIC:  Normal affect    ASSESSMENT:     1. CHB (complete heart block) (HCC)   2. Pacemaker battery depletion   3. Moderate dementia without behavioral disturbance, psychotic disturbance, mood disturbance, or anxiety, unspecified dementia type ([place])     PLAN:     In order of problems listed above:   Complete heart block: She is pacemaker dependent. Pacemaker at end of service: Thankfully she still has appropriate atrial sensed, ventricular paced rhythm but the battery is well beyond the point where its behavior will be predictable.  She requires urgent pacemaker generator change out.  Her son Louie Casa, who is with her today has medical power of attorney.  We did discuss the fact  that with her worsening dementia and expected decline in quality of life over the next few years it is not unreasonable to allow the natural decay of the pacemaker to decide the patient's outcome.  He thought about this for a while and decided that the best course of action was to proceed with generator change out.  The risks and benefits of this procedure were discussed in detail.  The major possible complication would be infection.  Recommend placement of an Aigis pouch.  Answered his questions and we will go ahead with the procedure today. Dementia: moderate. Plane for memory care unit.     Sanda Klein, MD, [facility] [phone] office [phone] pager

## 2022-08-27 NOTE — Progress Notes (Signed)
Cardiology Office Note:    Date:  08/27/2022   ID:  Tracey Holt, DOB 23-Mar-1922, MRN [id number]  PCP:  S[place]ier, Edward, [place] Providers Cardiologist:  None     Referring MD: Mackie Pai, PA-C   Chief Complaint  Patient presents with   Pacemaker Problem    History of Present Illness:    Tracey Holt is a 88 y.o. female with a hx of complete heart block presenting as cardiac arrest, complicated by transient stress cardiomyopathy [facility] [place] [facility], 2017) presents today for a pacemaker check and is found to have reached end of service as of Apr 13, 2022.  Thankfully the device is still providing atrial sensed, ventricular paced rhythm and she is not symptomatic.  She has worsening dementia and her son reports that she will soon be moving to the memory unit of her nursing facility.  She has not described any problems with shortness of breath, chest discomfort, angina, palpitations, syncope, lower extremity edema.  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  For the last couple of months she has not been receiving any antihypertensive medications and her blood pressure remains acceptable systolic blood pressure around 140.  She is no longer taking any lipid-lowering medications either.  They have been unable to perform any remote downloads on her pacemaker since October 2022.  They are in the process of troubleshooting that with with the [employer] service.  At the last download performed September 14, 2021 the device still had 3 years of anticipated generator longevity.  Pacing capture thresholds were low.  It is unclear what the device progressed to ERI so fast.  She has only 32% atrial pacing, but has 100% ventricular pacing.  Current battery voltage is 2.51 V (ERI 2.60 V was met on May 8 and end of service on May 24).  Several very brief episodes of atrial mode switch  lasting 16 seconds or less are recorded, all suggestive of paroxysmal atrial tachycardia.  The overall burden of mode switches less than 1%.  Past Medical History:  Diagnosis Date   Bradycardic cardiac arrest ([place]) 07/24/2016   Required CPR, intubation with multiple rib fractures. Had short term cardiac shock with acute heart failure.  Resulted in possible stress-induced cardiomyopathy   Cardiac related syncope 07/24/2016   Bradycardic arrest requiring CPR 2 with transvenous pacemaker placed followed by permanent pacemaker; complicated by respiratory arrest, type II MI, stress-induced cardiopathy   Cardiomyopathy ([place]) 07/2016   Moderate severely reduced EF with apical hypokinesis suggestive of stress-induced cardiac myopathy versus multivessel CAD --> in setting of her to cardiac arrest   COPD (chronic obstructive pulmonary disease) ([place])    By report   Dementia ([place])    History of chronic constipation    Hypercholesterolemia    Osteoporosis    Pacemaker 07/26/2016   Abbott dual-chamber pacemaker: Abbott Assurity Model #WS56812 - Serial #[id number]   Pneumonia 08/2016   Sarcoidosis of lung [facility])    Spinal stenosis     Past Surgical History:  Procedure Laterality Date   ADENOIDECTOMY     PACEMAKER INSERTION Left 07/26/2016   Abbott Assurity Model #VC94496 - Serial #[id number] -- complicated by pneumothorax requiring chest tube placement   TEE WITHOUT CARDIOVERSION  02/2013   EF 55-60% with normal global function. No thrombus, mass or vegetation. Trivial-small pericardial effusion. Moderate LA dilation. Mild MR.   TONSILLECTOMY     TRANSTHORACIC ECHOCARDIOGRAM  07/24/2016  [place]: EF 45-50%. Mid and apical inferior, inferolateral and apical hypokinesis. GR 2 DD. Mild LA and moderate RA dilation. Mild paracardial effusion. Moderate to severe TR. Basal and mid segment hypokinesis with apical hypokinesis. - Suspect stress-induced cardiopathy versus multivessel CAD.    TRANSTHORACIC ECHOCARDIOGRAM  07/26/2016   [place]: LIMITED (post PPM) - although the report suggested EF 30-35% with apical akinesis, rounding note suggested this was an improvement    Current Medications: No outpatient medications have been marked as taking for the 08/27/22 encounter (Office Visit) with Lakoda Mcanany, Dani Gobble, MD.     Allergies:   Dust mite extract, Levaquin [levofloxacin in d5w], Lidex [fluocinonide], Molds & smuts, Rifampin, Lactose, and Penicillins   Social History   Socioeconomic History   Marital status: Widowed    Spouse name: Not on file   Number of children: Not on file   Years of education: Not on file   Highest education level: Not on file  Occupational History   Not on file  Tobacco Use   Smoking status: Former    Packs/day: 1.50    Years: 17.00    Total pack years: 25.50    Types: Cigarettes    Start date: 12/18/1963    Quit date: 11/20/1983    Years since quitting: 38.7   Smokeless tobacco: Never  Vaping Use   Vaping Use: Never used  Substance and Sexual Activity   Alcohol use: No   Drug use: No   Sexual activity: Not on file  Other Topics Concern   Not on file  Social History Narrative   Recently moved to [place] from [place] to live near her daughter.   Social Determinants of Health   Financial Resource Strain: Not on file  Food Insecurity: Not on file  Transportation Needs: Not on file  Physical Activity: Not on file  Stress: Not on file  Social Connections: Not on file     Family History: The patient's family history is negative for Allergic rhinitis, Angioedema, Asthma, Eczema, Immunodeficiency, and Urticaria.  ROS:   Please see the history of present illness.     All other systems reviewed and are negative.  EKGs/Labs/Other Studies Reviewed:    The following studies were reviewed today: Comprehensive pacemaker check today.  Pacing thresholds were not tested to avoid battery drain.  EKG:   EKG is ordered today.  The ekg ordered today demonstrates atrial paced, ventricular sensed rhythm  Recent Labs: 10/26/2021: ALT 18 07/31/2022: BUN 21; Creatinine, Ser 0.69; Hemoglobin 11.5; Platelets 219; Potassium 4.1; Sodium 139  Recent Lipid Panel    Component Value Date/Time   CHOL 145 09/19/2017 1450   TRIG 72.0 09/19/2017 1450   HDL 69.40 09/19/2017 1450   CHOLHDL 2 09/19/2017 1450   VLDL 14.4 09/19/2017 1450   LDLCALC 61 09/19/2017 1450     Risk Assessment/Calculations:                Physical Exam:    VS:  BP 139/75   Pulse 77   Ht 4' (1.219 m)   Wt 94 lb 6.4 oz (42.8 kg)   SpO2 98%   BMI 28.81 kg/m     Wt Readings from Last 3 Encounters:  08/27/22 94 lb 6.4 oz (42.8 kg)  04/21/22 98 lb 5.2 oz (44.6 kg)  06/15/21 91 lb (41.3 kg)     GEN: Very slender body habitus well nourished, well developed in no acute distress.  The pacemaker site in the  left subclavian area appears healthy, although the skin is very thin.  There is no redness, tenderness or swelling. HEENT: Normal NECK: No JVD; No carotid bruits LYMPHATICS: No lymphadenopathy CARDIAC: RRR, no murmurs, rubs, gallops RESPIRATORY:  Clear to auscultation without rales, wheezing or rhonchi  ABDOMEN: Soft, non-tender, non-distended MUSCULOSKELETAL:  No edema; No deformity  SKIN: Warm and dry NEUROLOGIC: She is hard of hearing.  She holds short conversations but appears to be disoriented and confused. PSYCHIATRIC:  Normal affect   ASSESSMENT:    1. CHB (complete heart block) (HCC)   2. Pacemaker battery depletion   3. Moderate dementia without behavioral disturbance, psychotic disturbance, mood disturbance, or anxiety, unspecified dementia type ([place])    PLAN:    In order of problems listed above:  Complete heart block: She is pacemaker dependent. Pacemaker at end of service: Thankfully she still has appropriate atrial sensed, ventricular paced rhythm but the battery is well beyond the point where  its behavior will be predictable.  She requires urgent pacemaker generator change out.  Her son Louie Casa, who is with her today has medical power of attorney.  We did discuss the fact that with her worsening dementia and expected decline in quality of life over the next few years it is not unreasonable to allow the natural decay of the pacemaker to decide the patient's outcome.  He thought about this for a while and decided that the best course of action was to proceed with generator change out.  The risks and benefits of this procedure were discussed in detail.  The major possible complication would be infection.  Recommend placement of an Aigis pouch.  Answered his questions and we will go ahead with the procedure today. Dementia: moderate. Plane for memory care unit.           Medication Adjustments/Labs and Tests Ordered: Current medicines are reviewed at length with the patient today.  Concerns regarding medicines are outlined above.  Orders Placed This Encounter  Procedures   EKG 12-Lead   No orders of the defined types were placed in this encounter.   Patient Instructions  Medication Instructions:  No changes *If you need a refill on your cardiac medications before your next appointment, please call your pharmacy*  Follow-Up: At [facility], you and your health needs are our priority.  As part of our continuing mission to provide you with exceptional heart care, we have created designated Provider Care Teams.  These Care Teams include your primary Cardiologist (physician) and Advanced Practice Providers (APPs -  Physician Assistants and Nurse Practitioners) who all work together to provide you with the care you need, when you need it.  We recommend signing up for the patient portal called "MyChart".  Sign up information is provided on this After Visit Summary.  MyChart is used to connect with patients for Virtual Visits (Telemedicine).  Patients are able to view lab/test  results, encounter notes, upcoming appointments, etc.  Non-urgent messages can be sent to your provider as well.   To learn more about what you can do with MyChart, go to [email or web address].    Your next appointment:   Follow up in 2 weeks for a wound check Follow up in 3 months with Dr. Sallyanne Kuster Other Instructions    Implantable Device Instructions    WILLIAM LASKE  08/27/2022  You are scheduled for a PPM generator change on Monday, October 9 with Dr. Dani Gobble Brandi Armato.  1. Pre procedure Lab testing: Completed  2. Please  arrive at the Main Entrance A at [facility]: [place], [place] [address] on October 9 at 1:30 PM (This time is two hours before your procedure to ensure your preparation). Free valet parking service is available. You will check in at ADMITTING. The support person will be asked to wait in the waiting room.  It is OK to have someone drop you off and come back when you are ready to be discharged.        Special note: Every effort is made to have your procedure done on time. Please understand that emergencies sometimes delay  scheduled procedures.  3.  No eating or drinking after midnight prior to procedure.     4.  Medication instructions:  Nothing to hold   5.  The night before your procedure and the morning of your procedure scrub your neck/chest with CHG surgical scrub.  See instruction letter.  6. Plan to go home the same day, you will only stay overnight if medically necessary. 7.  You MUST have a responsible adult to drive you home. 8.   An adult MUST be with you the first 24 hours after you arrive home. 53..  Bring a current list of your medications, and the last time and date medication taken. 10. Bring ID and current insurance cards. 11. .Please wear clothes that are easy to get on and off and wear slip-on shoes.    You will follow up with the [place] clinic 10-14 days after your procedure.  You will follow up with Dr.  Dani Gobble Sky Borboa 91 days after your procedure.  These appointments will be made for you.   * If you have ANY questions after you get home, please call the office at [phone] or send a MyChart message.  FYI: For your safety, and to allow [place] to monitor your vital signs accurately during the surgery/procedure we request that if you have artificial nails, gel coating, SNS etc. Please have those removed prior to your surgery/procedure. Not having the nail coverings /polish removed may result in cancellation or delay of your surgery/procedure.    [place] - Preparing For Surgery    Before surgery, you can play an important role. Because skin is not sterile, your skin needs to be as free of germs as possible. You can reduce the number of germs on your skin by washing with CHG (chlorahexidine gluconate) Soap before surgery.  CHG is an antiseptic cleaner which kills germs and bonds with the skin to continue killing germs even after washing.  Please do not use if you have an allergy to CHG or antibacterial soaps.  If your skin becomes reddened/irritated stop using the CHG.   Do not shave (including legs and underarms) for at least 48 hours prior to first CHG shower.  It is OK to shave your face.  Please follow these instructions carefully:  1.  Shower the night before surgery and the morning of surgery with CHG.  2.  If you choose to wash your hair, wash your hair first as usual with your normal shampoo.  3.  After you shampoo, rinse your hair and body thoroughly to remove the shampoo.  4.  Use CHG as you would any other liquid soap.  You can apply CHG directly to the skin and wash gently with a clean washcloth. 5.  Apply the CHG Soap to your body ONLY FROM THE NECK DOWN.  Do not use on open wounds or open sores.  Avoid contact with your eyes, ears, mouth and genitals (private parts).  Wash genitals (private parts) with your normal soap.  6.  Wash thoroughly, paying special attention to the area  where your surgery will be performed.  7.  Thoroughly rinse your body with warm water from the neck down.   8.  DO NOT shower/wash with your normal soap after using and rinsing off the CHG soap.  9.  Pat yourself dry with a clean towel.           10.  Wear clean pajamas.           11.  Place clean sheets on your bed the night of your first shower and do not sleep with pets.  Day of Surgery: Do not apply any deodorants/lotions.  Please wear clean clothes to the hospital/surgery center.       Signed, Sanda Klein, MD  08/27/2022 1:48 PM    [place]

## 2022-08-27 NOTE — Patient Instructions (Signed)
Medication Instructions:  No changes *If you need a refill on your cardiac medications before your next appointment, please call your pharmacy*  Follow-Up: At [facility], you and your health needs are our priority.  As part of our continuing mission to provide you with exceptional heart care, we have created designated Provider Care Teams.  These Care Teams include your primary Cardiologist (physician) and Advanced Practice Providers (APPs -  Physician Assistants and Nurse Practitioners) who all work together to provide you with the care you need, when you need it.  We recommend signing up for the patient portal called "MyChart".  Sign up information is provided on this After Visit Summary.  MyChart is used to connect with patients for Virtual Visits (Telemedicine).  Patients are able to view lab/test results, encounter notes, upcoming appointments, etc.  Non-urgent messages can be sent to your provider as well.   To learn more about what you can do with MyChart, go to [email or web address].    Your next appointment:   Follow up in 2 weeks for a wound check Follow up in 3 months with Dr. Sallyanne Kuster Other Instructions    Implantable Device Instructions    Tracey Holt  08/27/2022  You are scheduled for a PPM generator change on Monday, October 9 with Dr. Dani Gobble Croitoru.  1. Pre procedure Lab testing: Completed  2. Please arrive at the Main Entrance A at [facility]: [place], [place] [address] on October 9 at 1:30 PM (This time is two hours before your procedure to ensure your preparation). Free valet parking service is available. You will check in at ADMITTING. The support person will be asked to wait in the waiting room.  It is OK to have someone drop you off and come back when you are ready to be discharged.        Special note: Every effort is made to have your procedure done on time. Please understand that emergencies sometimes delay  scheduled  procedures.  3.  No eating or drinking after midnight prior to procedure.     4.  Medication instructions:  Nothing to hold   5.  The night before your procedure and the morning of your procedure scrub your neck/chest with CHG surgical scrub.  See instruction letter.  6. Plan to go home the same day, you will only stay overnight if medically necessary. 7.  You MUST have a responsible adult to drive you home. 8.   An adult MUST be with you the first 24 hours after you arrive home. 58..  Bring a current list of your medications, and the last time and date medication taken. 10. Bring ID and current insurance cards. 11. .Please wear clothes that are easy to get on and off and wear slip-on shoes.    You will follow up with the [place] clinic 10-14 days after your procedure.  You will follow up with Dr. Dani Gobble Croitoru 91 days after your procedure.  These appointments will be made for you.   * If you have ANY questions after you get home, please call the office at [phone] or send a MyChart message.  FYI: For your safety, and to allow [place] to monitor your vital signs accurately during the surgery/procedure we request that if you have artificial nails, gel coating, SNS etc. Please have those removed prior to your surgery/procedure. Not having the nail coverings /polish removed may result in cancellation or delay of your surgery/procedure.  [place] - Preparing For Surgery    Before surgery, you can play an important role. Because skin is not sterile, your skin needs to be as free of germs as possible. You can reduce the number of germs on your skin by washing with CHG (chlorahexidine gluconate) Soap before surgery.  CHG is an antiseptic cleaner which kills germs and bonds with the skin to continue killing germs even after washing.  Please do not use if you have an allergy to CHG or antibacterial soaps.  If your skin becomes reddened/irritated stop using the CHG.   Do not shave  (including legs and underarms) for at least 48 hours prior to first CHG shower.  It is OK to shave your face.  Please follow these instructions carefully:  1.  Shower the night before surgery and the morning of surgery with CHG.  2.  If you choose to wash your hair, wash your hair first as usual with your normal shampoo.  3.  After you shampoo, rinse your hair and body thoroughly to remove the shampoo.  4.  Use CHG as you would any other liquid soap.  You can apply CHG directly to the skin and wash gently with a clean washcloth. 5.  Apply the CHG Soap to your body ONLY FROM THE NECK DOWN.  Do not use on open wounds or open sores.  Avoid contact with your eyes, ears, mouth and genitals (private parts).  Wash genitals (private parts) with your normal soap.  6.  Wash thoroughly, paying special attention to the area where your surgery will be performed.  7.  Thoroughly rinse your body with warm water from the neck down.   8.  DO NOT shower/wash with your normal soap after using and rinsing off the CHG soap.  9.  Pat yourself dry with a clean towel.           10.  Wear clean pajamas.           11.  Place clean sheets on your bed the night of your first shower and do not sleep with pets.  Day of Surgery: Do not apply any deodorants/lotions.  Please wear clean clothes to the hospital/surgery center.

## 2022-08-27 NOTE — Progress Notes (Signed)
The patient came to short stay and was asked to change into a gown.  The patient became extremely combative and will not cooperate with the nursing staff or with her son.  She would not change in a gown or let us place an IV.  She has started screaming and clearly not allow any medical procedures to be performed.  Unfortunately her dementia has progressed to the point that we will be very difficult if not impossible to perform this procedure without general anesthesia.  In turn, general anesthesia in this frail 88 year old has risks that are probably unacceptably high.   We will tentatively reschedule the procedure for next week, giving her sons time to confer amongst themselves and with their mother, deciding on the best next course of action.   It is possible that the best course of action for this patient with declining quality of life is to allow the pacemaker battery to decay naturally, which will eventually lead to her demise.

## 2022-08-30 ENCOUNTER — Encounter: Payer: Self-pay | Admitting: Cardiovascular Disease

## 2022-08-30 ENCOUNTER — Encounter: Payer: Self-pay | Admitting: Medical

## 2022-08-30 ENCOUNTER — Telehealth: Payer: Self-pay | Admitting: Medical

## 2022-08-30 NOTE — Telephone Encounter (Signed)
Amber with [facility] is calling to get verbal orders to proceed with hospice care for patient. Please call [phone]. Ok to leave vm.

## 2022-08-31 ENCOUNTER — Telehealth: Payer: Self-pay | Admitting: Medical

## 2022-08-31 NOTE — Telephone Encounter (Signed)
VO given.

## 2022-08-31 NOTE — Telephone Encounter (Signed)
Made savannah aware also made her aware PCP would like office notes and updates on patient care

## 2022-08-31 NOTE — Telephone Encounter (Signed)
[place] from [facility] called to advise that they received a hospice referral and the son is requesting Tracey Holt to serve as the Hospice attending physician. [place] Mam also would like to know if he wants to his own comfort orders or defer to their hospice physician. She can be reached at [phone]

## 2022-09-13 ENCOUNTER — Ambulatory Visit (INDEPENDENT_AMBULATORY_CARE_PROVIDER_SITE_OTHER)

## 2022-09-13 DIAGNOSIS — I442 Atrioventricular block, complete: Secondary | ICD-10-CM | POA: Diagnosis not present

## 2022-09-18 LAB — CUP PACEART REMOTE DEVICE CHECK
Battery Remaining Longevity: 36 mo
Battery Remaining Percentage: 39 %
Battery Voltage: 2.95 V
Brady Statistic AP VP Percent: 35 %
Brady Statistic AP VS Percent: 1 %
Brady Statistic AS VP Percent: 65 %
Brady Statistic AS VS Percent: 1 %
Brady Statistic RA Percent Paced: 34 %
Brady Statistic RV Percent Paced: 99 %
Date Time Interrogation Session: 20221027020016
Implantable Lead Connection Status: 753985
Implantable Lead Connection Status: 753985
Implantable Lead Implant Date: 20170907
Implantable Lead Implant Date: 20170907
Implantable Lead Location: 753859
Implantable Lead Location: 753860
Implantable Pulse Generator Implant Date: 20170907
Lead Channel Impedance Value: 340 Ohm
Lead Channel Impedance Value: 390 Ohm
Lead Channel Pacing Threshold Amplitude: 0.5 V
Lead Channel Pacing Threshold Amplitude: 0.75 V
Lead Channel Pacing Threshold Pulse Width: 0.4 ms
Lead Channel Pacing Threshold Pulse Width: 0.4 ms
Lead Channel Sensing Intrinsic Amplitude: 12 mV
Lead Channel Sensing Intrinsic Amplitude: 2.3 mV
Lead Channel Setting Pacing Amplitude: 2.5 V
Lead Channel Setting Pacing Amplitude: 2.5 V
Lead Channel Setting Pacing Pulse Width: 0.4 ms
Lead Channel Setting Sensing Sensitivity: 2 mV
Pulse Gen Model: 2272
Pulse Gen Serial Number: 7944486

## 2022-09-24 NOTE — Progress Notes (Signed)
Remote pacemaker transmission.   

## 2023-01-13 ENCOUNTER — Other Ambulatory Visit: Payer: Self-pay

## 2023-01-13 ENCOUNTER — Emergency Department (HOSPITAL_COMMUNITY)

## 2023-01-13 ENCOUNTER — Emergency Department (HOSPITAL_COMMUNITY)
Admission: EM | Admit: 2023-01-13 | Discharge: 2023-01-13 | Disposition: A | Discharge status: Home / Self Care | Attending: Emergency Medicine | Admitting: Emergency Medicine

## 2023-01-13 ENCOUNTER — Encounter (HOSPITAL_COMMUNITY): Payer: Self-pay

## 2023-01-13 DIAGNOSIS — K449 Diaphragmatic hernia without obstruction or gangrene: Secondary | ICD-10-CM | POA: Insufficient documentation

## 2023-01-13 DIAGNOSIS — R109 Unspecified abdominal pain: Secondary | ICD-10-CM

## 2023-01-13 DIAGNOSIS — Z950 Presence of cardiac pacemaker: Secondary | ICD-10-CM | POA: Insufficient documentation

## 2023-01-13 DIAGNOSIS — I10 Essential (primary) hypertension: Secondary | ICD-10-CM | POA: Insufficient documentation

## 2023-01-13 DIAGNOSIS — R0902 Hypoxemia: Secondary | ICD-10-CM | POA: Insufficient documentation

## 2023-01-13 DIAGNOSIS — R1011 Right upper quadrant pain: Secondary | ICD-10-CM | POA: Diagnosis present

## 2023-01-13 DIAGNOSIS — I7121 Aneurysm of the ascending aorta, without rupture: Secondary | ICD-10-CM | POA: Diagnosis not present

## 2023-01-13 LAB — CBC WITH DIFFERENTIAL/PLATELET
Abs Immature Granulocytes: 0.02 10*3/uL (ref 0.00–0.07)
Basophils Absolute: 0 10*3/uL (ref 0.0–0.1)
Basophils Relative: 1 %
Eosinophils Absolute: 0.1 10*3/uL (ref 0.0–0.5)
Eosinophils Relative: 2 %
HCT: 37.3 % (ref 36.0–46.0)
Hemoglobin: 11.3 g/dL — ABNORMAL LOW (ref 12.0–15.0)
Immature Granulocytes: 0 %
Lymphocytes Relative: 26 %
Lymphs Abs: 1.4 10*3/uL (ref 0.7–4.0)
MCH: 27.1 pg (ref 26.0–34.0)
MCHC: 30.3 g/dL (ref 30.0–36.0)
MCV: 89.4 fL (ref 80.0–100.0)
Monocytes Absolute: 0.8 10*3/uL (ref 0.1–1.0)
Monocytes Relative: 14 %
Neutro Abs: 3.1 10*3/uL (ref 1.7–7.7)
Neutrophils Relative %: 57 %
Platelets: 280 10*3/uL (ref 150–400)
RBC: 4.17 MIL/uL (ref 3.87–5.11)
RDW: 14.8 % (ref 11.5–15.5)
WBC: 5.4 10*3/uL (ref 4.0–10.5)
nRBC: 0 % (ref 0.0–0.2)

## 2023-01-13 LAB — COMPREHENSIVE METABOLIC PANEL
ALT: 14 U/L (ref 0–44)
AST: 23 U/L (ref 15–41)
Albumin: 3 g/dL — ABNORMAL LOW (ref 3.5–5.0)
Alkaline Phosphatase: 52 U/L (ref 38–126)
Anion gap: 10 (ref 5–15)
BUN: 16 mg/dL (ref 8–23)
CO2: 28 mmol/L (ref 22–32)
Calcium: 8.2 mg/dL — ABNORMAL LOW (ref 8.9–10.3)
Chloride: 100 mmol/L (ref 98–111)
Creatinine, Ser: 0.59 mg/dL (ref 0.44–1.00)
GFR, Estimated: >60 mL/min (ref >60)
Glucose, Bld: 102 mg/dL — ABNORMAL HIGH (ref 70–99)
Potassium: 3.6 mmol/L (ref 3.5–5.1)
Sodium: 138 mmol/L (ref 135–145)
Total Bilirubin: 0.6 mg/dL (ref 0.3–1.2)
Total Protein: 5.8 g/dL — ABNORMAL LOW (ref 6.5–8.1)

## 2023-01-13 LAB — LACTIC ACID, PLASMA: Lactic Acid, Venous: 0.9 mmol/L (ref 0.5–1.9)

## 2023-01-13 LAB — URINALYSIS, ROUTINE W REFLEX MICROSCOPIC
Bilirubin Urine: NEGATIVE
Glucose, UA: NEGATIVE mg/dL
Hgb urine dipstick: NEGATIVE
Ketones, ur: NEGATIVE mg/dL
Leukocytes,Ua: NEGATIVE
Nitrite: NEGATIVE
Protein, ur: NEGATIVE mg/dL
Specific Gravity, Urine: 1.025 (ref 1.005–1.030)
pH: 5 (ref 5.0–8.0)

## 2023-01-13 MED ORDER — HYDROCODONE-ACETAMINOPHEN 5-325 MG PO TABS
1.0000 | ORAL_TABLET | Freq: Once | ORAL | Status: AC
  Administered 2023-01-13: 1 via ORAL
  Filled 2023-01-13: qty 1

## 2023-01-13 MED ORDER — LIDOCAINE 5 % EX PTCH
1.0000 | MEDICATED_PATCH | Freq: Once | CUTANEOUS | Status: DC
  Administered 2023-01-13: 1 via TRANSDERMAL
  Filled 2023-01-13: qty 1

## 2023-01-13 MED ORDER — KETOROLAC TROMETHAMINE 15 MG/ML IJ SOLN
15.0000 mg | Freq: Once | INTRAMUSCULAR | Status: AC
  Administered 2023-01-13: 15 mg via INTRAVENOUS
  Filled 2023-01-13: qty 1

## 2023-01-13 MED ORDER — LIDOCAINE 5 % EX PTCH: 1.0000 | MEDICATED_PATCH | CUTANEOUS | 0 refills | Status: DC

## 2023-01-13 MED ORDER — ONDANSETRON 4 MG PO TBDP
4.0000 mg | ORAL_TABLET | Freq: Once | ORAL | Status: AC
  Administered 2023-01-13: 4 mg via ORAL
  Filled 2023-01-13: qty 1

## 2023-01-13 NOTE — ED Provider Triage Note (Signed)
Emergency Medicine Provider Triage Evaluation Note  Tracey Holt , a 89 y.o. female  was evaluated in triage.  Pt complains of sudden onset severe abdominal pain Patient lives in independent living.  She is hard of hearing and has some significant dementia.  Her son is at bedside and states that they went to lunch and were fine.  About an hour ago he got a call from her facility stating that she was complaining of severe abdominal pain.  She is unable to provide much history.  He does report history of constipation.  She is also a hospice patient and son reports that they do not want any "major interventions.".  Review of Systems  Positive: Pain  Negative: vomiting  Physical Exam  BP 122/82 (BP Location: Left Arm)   Pulse 69   Temp (!) 97.5 F (36.4 C) (Oral)   Resp 16   SpO2 97%  Gen:   Patient is alert, grimace on her face.  She appears distracted by some painful internal process Resp:  Reading is splinted MSK:   Moves extremities without difficulty  Other:  Distended, firm, tender abdomen with involuntary guarding  Medical Decision Making  Medically screening exam initiated at 4:06 PM.  Appropriate orders placed.  YOMAIRA OFFUTT was informed that the remainder of the evaluation will be completed by another provider, this initial triage assessment does not replace that evaluation, and the importance of remaining in the ED until their evaluation is complete.     Margarita Mail, PA-C 01/13/23 [id number]

## 2023-01-13 NOTE — ED Notes (Signed)
Notified Dr. Maylon Peppers that the patients oxygen was 80's. MD in the room

## 2023-01-13 NOTE — Discharge Instructions (Addendum)
Your mother was seen in the emergency department for her right-sided flank and abdominal pain.  Her workup showed no signs of infection, broken bones or obvious cause of her pain.  She may have a muscle strain or spasm.  She can take Tylenol up to every 6 hours, use ice or heat as well as the lidocaine patches.  She can take NSAIDs such as Motrin however this can cause GI upset and if she is taking NSAIDs I recommend taking an antacid with these and do not take it on an empty stomach.  Her workup incidentally shows that she has an aneurysm her of her ascending aorta that can be monitored by her primary doctor as well as a hiatal hernia.  Her oxygen was running low when she was in the emergency department which she reported was normal for her and she should have this monitored by her primary doctor as well.  She should return to the emergency department if she is having fevers, worsening shortness of breath, repetitive vomiting or if you have any other new or concerning symptoms.

## 2023-01-13 NOTE — ED Triage Notes (Signed)
Pt presents to ED from [facility] accompanied by son C/O R side pain. Pt lethargic in triage. Per son, pt is on hospice d/t pacemaker "wearing out."

## 2023-01-13 NOTE — ED Provider Notes (Signed)
[facility] EMERGENCY DEPARTMENT AT [facility] Provider Note   CSN: [id number] Arrival date & time: 01/13/23  1533     History  Chief Complaint  Patient presents with   Abdominal Pain    Tracey Holt is a 89 y.o. female.  Patient is a 89 year old female with a past medical history of hypertension, dementia and complete heart block with pacemaker in place presenting to the emergency department with right-sided abdominal pain.  Patient is here with her son who reports that she was feeling well this morning, ate lunch normally and then around 2:00 this afternoon went up to the dust at her assisted living complaining of severe right-sided abdominal pain.  She denies any nausea, vomiting or diarrhea.  She reports that she does have chronic constipation.  She denies any chest pain or shortness of breath.  She denies any trauma or falls but reports that the pain is worse with movement.  Her son also reports that she was due for a battery change for her pacemaker, however was unable to tolerate the exchange and he is unsure if her pacemaker is still working.  The history is provided by the patient and a relative. History limited by: Hard of hearing, dementia.  Abdominal Pain      Home Medications Prior to Admission medications   Medication Sig Start Date End Date Taking? Authorizing Provider  lidocaine (LIDODERM) 5 % Place 1 patch onto the skin daily. Remove & Discard patch within 12 hours or as directed by MD 01/13/23  Yes Maylon Peppers, Jordan Hawks K, DO  amLODipine (NORVASC) 2.5 MG tablet TAKE 1 TABLET BY MOUTH EVERY DAY Patient not taking: Reported on 06/15/2021 04/10/21   Saguier, Percell Miller, PA-C  Ascorbic Acid (VITAMIN C) 1000 MG tablet Take 1,000 mg by mouth daily. Patient not taking: Reported on 06/15/2021    [provider]  atorvastatin (LIPITOR) 10 MG tablet TAKE 1 TABLET BY MOUTH EVERY DAY Patient not taking: Reported on 06/15/2021 05/31/21   Saguier, Percell Miller, PA-C   azelastine (ASTELIN) 0.1 % nasal spray Place 2 sprays into both nostrils 2 (two) times daily. Use in each nostril as directed Patient not taking: Reported on 06/15/2021 02/24/20   Saguier, Percell Miller, PA-C  Biotin 10000 MCG TABS Take 10,000 mcg by mouth daily.  Patient not taking: Reported on 06/15/2021    [provider]  hydroxypropyl methylcellulose / hypromellose (ISOPTO TEARS / GONIOVISC) 2.5 % ophthalmic solution Place 1 drop into both eyes daily. Patient not taking: Reported on 06/15/2021    [provider]  levocetirizine (XYZAL) 5 MG tablet TAKE 1 TABLET BY MOUTH EVERY DAY IN THE EVENING Patient not taking: Reported on 06/15/2021 11/23/19   Saguier, Percell Miller, PA-C  Multiple Vitamins-Minerals (MULTIVITAMIN WITH MINERALS) tablet Take 1 tablet by mouth daily. Patient not taking: Reported on 06/15/2021    [provider]      Allergies    Dust mite extract, Levaquin [levofloxacin in d5w], Lidex [fluocinonide], Molds & smuts, Rifampin, Lactose, and Penicillins    Review of Systems   Review of Systems  Gastrointestinal:  Positive for abdominal pain.    Physical Exam Updated Vital Signs BP (!) 159/83   Pulse 71   Temp 97.7 F (36.5 C) (Oral)   Resp (!) 23   Ht 4' (1.219 m)   Wt 42.8 kg   SpO2 (!) 80%   BMI 28.79 kg/m  Physical Exam Vitals and nursing note reviewed.  Constitutional:      General: She is  not in acute distress.    Appearance: She is not toxic-appearing.     Comments: Frail appearing  HENT:     Head: Normocephalic and atraumatic.     Mouth/Throat:     Mouth: Mucous membranes are moist.     Pharynx: Oropharynx is clear.  Eyes:     Extraocular Movements: Extraocular movements intact.  Cardiovascular:     Rate and Rhythm: Normal rate and regular rhythm.     Heart sounds: Normal heart sounds.  Pulmonary:     Effort: Pulmonary effort is normal.     Breath sounds: Normal breath sounds.  Chest:     Chest wall: Tenderness (R lower chest wall)  present.  Abdominal:     General: Abdomen is flat.     Palpations: Abdomen is soft.     Tenderness: There is abdominal tenderness in the right upper quadrant. There is no right CVA tenderness, left CVA tenderness, guarding or rebound.  Skin:    General: Skin is warm and dry.     Findings: No rash.  Neurological:     General: No focal deficit present.     Mental Status: She is alert.     Comments: At baseline   Psychiatric:        Mood and Affect: Mood normal.        Behavior: Behavior normal.     ED Results / Procedures / Treatments   Labs (all labs ordered are listed, but only abnormal results are displayed) Labs Reviewed  CBC WITH DIFFERENTIAL/PLATELET - Abnormal; Notable for the following components:      Result Value   Hemoglobin 11.3 (*)    All other components within normal limits  COMPREHENSIVE METABOLIC PANEL - Abnormal; Notable for the following components:   Glucose, Bld 102 (*)    Calcium 8.2 (*)    Total Protein 5.8 (*)    Albumin 3.0 (*)    All other components within normal limits  LACTIC ACID, PLASMA  URINALYSIS, ROUTINE W REFLEX MICROSCOPIC    EKG EKG Interpretation  Date/Time:  Sunday January 13 2023 20:40:34 EST Ventricular Rate:  73 PR Interval:  185 QRS Duration: 154 QT Interval:  454 QTC Calculation: 501 R Axis:   -89 Text Interpretation: Electronic ventricular pacemaker Atrial premature complexes LVH with IVCD, LAD and secondary repol abnrm Prolonged QT interval No significant change since last tracing Confirmed by Leanord Asal ([id number]) on 01/13/2023 8:48:36 PM  Radiology CT CHEST ABDOMEN PELVIS WO CONTRAST  Result Date: 01/13/2023 CLINICAL DATA:  89 year old with abdominal pain. Aortic aneurysm suspected. EXAM: CT CHEST, ABDOMEN AND PELVIS WITHOUT CONTRAST TECHNIQUE: Multidetector CT imaging of the chest, abdomen and pelvis was performed following the standard protocol without IV contrast. RADIATION DOSE REDUCTION: This exam was performed  according to the departmental dose-optimization program which includes automated exposure control, adjustment of the mA and/or kV according to patient size and/or use of iterative reconstruction technique. COMPARISON:  Contrast-enhanced abdominal CT 10/26/2021, chest CT 01/02/2019 FINDINGS: CT CHEST FINDINGS Cardiovascular: Fusiform aneurysmal dilatation of the ascending aorta, maximal dimension 4.2 cm. No periaortic stranding. There is diffuse aortic tortuosity with moderate atherosclerosis. Left-sided pacemaker in place. Dense mitral annulus calcifications. Mild cardiomegaly. Trace pericardial effusion anteriorly. Mediastinum/Nodes: Paucity of mediastinal fat limits detailed assessment. There is no obvious bulky adenopathy or mass. Moderate-sized hiatal hernia. The esophagus is dilated and contains intraluminal debris with mild wall thickening in the midportion. Lungs/Pleura: Heterogeneous material filling the right lower lobe bronchus extending into the bronchus  intermedius, right middle and lower lobe bronchi. Partial right middle lobe collapse. Chronic triangular opacity in the lingula with associated airway collapse, present over multiple prior exams without progression. This demonstrates low-density in may represent lipoid pneumonia. Small left and trace right pleural effusion. Musculoskeletal: Marked exaggerated thoracic kyphosis. Multiple chronic thoracic compression deformities, grossly stable from 2020. CT ABDOMEN PELVIS FINDINGS Hepatobiliary: Punctate hepatic granuloma. No evidence of focal over lesion on this unenhanced exam. Unremarkable gallbladder. The biliary tree is suboptimally assessed. The previous intrahepatic biliary ductal dilatation is difficult to evaluate on the current exam due to external streak artifact from arms down positioning, however grossly stable. Pancreas: Not well assessed. Spleen: Normal in size without focal abnormality. Adrenals/Urinary Tract: No adrenal nodule. Previously  described cyst in the upper pole of the right kidney is grossly stable, although not well assessed currently. There is no hydronephrosis. Unremarkable urinary bladder Stomach/Bowel: Bowel assessment is limited in the absence of contrast, paucity of intra-abdominal fat and motion. Allowing for this there is no evidence of bowel obstruction or inflammation. There is a small posterior lumbar triangle hernia containing bowel, but no obstructive change or inflammation. Colonic diverticulosis. Moderate volume of colonic stool. Vascular/Lymphatic: Abdominal aortic tortuosity and atherosclerosis. No abdominal aortic aneurysm. Assessment for adenopathy is significantly limited. Reproductive: The uterus is not definitively visualized. Other: No free air or ascites. Musculoskeletal: Scoliosis and degenerative change in the spine. Degenerative change of both hips. There are no acute or suspicious osseous abnormalities. IMPRESSION: 1. Fusiform aneurysmal dilatation of the ascending aorta, maximal dimension 4.2 cm. No periaortic stranding. Recommend annual imaging follow-up by CTA or MRA, giving consideration to patient's advanced age. 2010; 121: [id number]. 2. Heterogeneous material filling the right lower lobe bronchus extending into the bronchus intermedius, right middle and lower lobe bronchi. Partial right middle lobe collapse. This may represent retained secretions or aspiration. 3. Chronic triangular opacity in the lingula with associated airway collapse, present over multiple prior exams without progression. This demonstrates low-density in may represent lipoid pneumonia. 4. Small left and trace right pleural effusion. 5. Moderate-sized hiatal hernia. The esophagus is dilated and contains intraluminal debris with mild wall thickening in the midportion, which may be due to reflux or esophageal dysmotility. 6. No acute abnormality in the abdomen/pelvis. 7. Colonic diverticulosis without diverticulitis. Aortic Atherosclerosis  (ICD10-I70.0). Electronically Signed   By: Keith Rake M.D.   On: 01/13/2023 17:48    Procedures Procedures    Medications Ordered in ED Medications  lidocaine (LIDODERM) 5 % 1 patch (1 patch Transdermal Patch Applied 01/13/23 2030)  HYDROcodone-acetaminophen (NORCO/VICODIN) 5-325 MG per tablet 1 tablet (1 tablet Oral Given 01/13/23 1614)  ondansetron (ZOFRAN-ODT) disintegrating tablet 4 mg (4 mg Oral Given 01/13/23 1615)  ketorolac (TORADOL) 15 MG/ML injection 15 mg (15 mg Intravenous Given 01/13/23 2018)    ED Course/ Medical Decision Making/ A&P Clinical Course as of 01/13/23 2123  Sun Jan 13, 2023  2119 Patient's labs and urine within normal range.  Patient's nurse alerted me that she was desatting into the 44s.  I evaluated the patient at bedside and she had a good [place] on the monitor satting 84 to 86% on room air.  She had no increasing respiratory distress.  Spoke with the patient's son who states that she has had hypoxia in the past and has been trialed on oxygen but has never been able to tolerate this and always takes it off.  He states that he would not like to place her on oxygen or  anything that would make her uncomfortable as she is on hospice.  He understands the risk of hypoxia and is comfortable with her being discharged home with primary care follow-up. [VK]    Clinical Course User Index [VK] Kemper Durie, DO                             Medical Decision Making This patient presents to the ED with chief complaint(s) of RUQ/R lower chest pain with pertinent past medical history of HTN, complete heart block with pacemaker in place, dementia which further complicates the presenting complaint. The complaint involves an extensive differential diagnosis and also carries with it a high risk of complications and morbidity.    The differential diagnosis includes rib fracture, pneumonia, pneumothorax, pulmonary edema, pleural effusion, atypical ACS, pancreatitis,  cholelithiasis, cholecystitis, no rash making shingles unlikely, muscle strain or spasm  Additional history obtained: Additional history obtained from family Records reviewed prior cardiology records  ED Course and Reassessment: Patient was initially evaluated by provider in triage and had CT chest abdomen and pelvis performed without contrast.  CT imaging showed no acute disease.  She does have an ascending aortic aneurysm without signs of rupture as well as a hiatal hernia which are unlikely to explain her pain.  She does have tenderness over the right lateral ribs and right upper quadrant concerning for possible musculoskeletal pain.  She did have some improvement with Norco and will be given Toradol and lidocaine patch.  Labs are pending at this time and EKG is pending.  Independent labs interpretation:  The following labs were independently interpreted: within normal range  Independent visualization of imaging: - I independently visualized the following imaging with scope of interpretation limited to determining acute life threatening conditions related to emergency care: CTAP, which revealed no acute abnormalities, incidental findings of ascending aortic aneurysm, hiatal hernia  Consultation: - Consulted or discussed management/test interpretation w/ external professional: N/A  Consideration for admission or further workup: Patient has no emergent conditions requiring admission or further work-up at this time and is stable for discharge home with primary care follow-up  Social Determinants of health: N/a    Risk Prescription drug management.          Final Clinical Impression(s) / ED Diagnoses Final diagnoses:  Aneurysm of ascending aorta without rupture ([place])  Hiatal hernia  Hypoxia  Right flank pain    Rx / DC Orders ED Discharge Orders          Ordered    lidocaine (LIDODERM) 5 %  Every 24 hours        01/13/23 2121              Kemper Durie,  DO 01/13/23 2123

## 2023-01-16 ENCOUNTER — Telehealth: Payer: Self-pay

## 2023-01-16 NOTE — Telephone Encounter (Signed)
[employer] sent over notification of hospice death   Date of death: 29-Jan-2023  Time of death: 12:52 pm Hospice dx: senile degeneration of brain, not elsewhere classified   Letter placed in scan

## 2023-01-18 DEATH — deceased
